# Patient Record
Sex: Male | Born: 1945
Health system: Southern US, Community
[De-identification: ages and names within clinical notes are randomized; demographics above are authoritative.]

## PROBLEM LIST (undated history)

## (undated) DIAGNOSIS — E785 Hyperlipidemia, unspecified: Secondary | ICD-10-CM

## (undated) DIAGNOSIS — S2239XA Fracture of one rib, unspecified side, initial encounter for closed fracture: Secondary | ICD-10-CM

## (undated) DIAGNOSIS — G4700 Insomnia, unspecified: Secondary | ICD-10-CM

## (undated) DIAGNOSIS — N200 Calculus of kidney: Secondary | ICD-10-CM

## (undated) DIAGNOSIS — K219 Gastro-esophageal reflux disease without esophagitis: Secondary | ICD-10-CM

## (undated) DIAGNOSIS — S329XXA Fracture of unspecified parts of lumbosacral spine and pelvis, initial encounter for closed fracture: Secondary | ICD-10-CM

## (undated) DIAGNOSIS — I2510 Atherosclerotic heart disease of native coronary artery without angina pectoris: Secondary | ICD-10-CM

## (undated) DIAGNOSIS — M1990 Unspecified osteoarthritis, unspecified site: Secondary | ICD-10-CM

## (undated) DIAGNOSIS — M069 Rheumatoid arthritis, unspecified: Secondary | ICD-10-CM

## (undated) DIAGNOSIS — Z8619 Personal history of other infectious and parasitic diseases: Secondary | ICD-10-CM

## (undated) DIAGNOSIS — I10 Essential (primary) hypertension: Secondary | ICD-10-CM

## (undated) DIAGNOSIS — Z87442 Personal history of urinary calculi: Secondary | ICD-10-CM

## (undated) DIAGNOSIS — Z9889 Other specified postprocedural states: Secondary | ICD-10-CM

## (undated) DIAGNOSIS — R7303 Prediabetes: Secondary | ICD-10-CM

## (undated) DIAGNOSIS — I2102 ST elevation (STEMI) myocardial infarction involving left anterior descending coronary artery: Secondary | ICD-10-CM

## (undated) DIAGNOSIS — I255 Ischemic cardiomyopathy: Secondary | ICD-10-CM

## (undated) DIAGNOSIS — R7302 Impaired glucose tolerance (oral): Secondary | ICD-10-CM

## (undated) HISTORY — DX: Personal history of other infectious and parasitic diseases: Z86.19

## (undated) HISTORY — DX: Atherosclerotic heart disease of native coronary artery without angina pectoris: I25.10

## (undated) HISTORY — DX: Hyperlipidemia, unspecified: E78.5

## (undated) HISTORY — DX: Rheumatoid arthritis, unspecified: M06.9

## (undated) HISTORY — PX: OTHER SURGICAL HISTORY: SHX169

## (undated) HISTORY — DX: Calculus of kidney: N20.0

## (undated) HISTORY — DX: Other specified postprocedural states: Z98.890

## (undated) HISTORY — DX: Essential (primary) hypertension: I10

## (undated) HISTORY — DX: Insomnia, unspecified: G47.00

## (undated) HISTORY — DX: Impaired glucose tolerance (oral): R73.02

## (undated) HISTORY — DX: Ischemic cardiomyopathy: I25.5

---

## 1966-12-13 DIAGNOSIS — N200 Calculus of kidney: Secondary | ICD-10-CM

## 1966-12-13 HISTORY — DX: Calculus of kidney: N20.0

## 1996-12-13 DIAGNOSIS — M069 Rheumatoid arthritis, unspecified: Secondary | ICD-10-CM

## 1996-12-13 HISTORY — DX: Rheumatoid arthritis, unspecified: M06.9

## 2005-01-18 ENCOUNTER — Ambulatory Visit: Payer: Self-pay | Admitting: Internal Medicine

## 2005-08-02 ENCOUNTER — Ambulatory Visit: Payer: Self-pay | Admitting: Internal Medicine

## 2005-08-23 ENCOUNTER — Ambulatory Visit: Payer: Self-pay | Admitting: Internal Medicine

## 2005-08-24 ENCOUNTER — Ambulatory Visit (HOSPITAL_BASED_OUTPATIENT_CLINIC_OR_DEPARTMENT_OTHER): Admission: RE | Admit: 2005-08-24 | Discharge: 2005-08-24 | Payer: Self-pay | Admitting: Urology

## 2005-08-24 ENCOUNTER — Ambulatory Visit (HOSPITAL_COMMUNITY): Admission: RE | Admit: 2005-08-24 | Discharge: 2005-08-24 | Payer: Self-pay | Admitting: Urology

## 2005-09-03 ENCOUNTER — Ambulatory Visit (HOSPITAL_BASED_OUTPATIENT_CLINIC_OR_DEPARTMENT_OTHER): Admission: RE | Admit: 2005-09-03 | Discharge: 2005-09-03 | Payer: Self-pay | Admitting: Urology

## 2005-09-03 ENCOUNTER — Ambulatory Visit (HOSPITAL_COMMUNITY): Admission: RE | Admit: 2005-09-03 | Discharge: 2005-09-03 | Payer: Self-pay | Admitting: Urology

## 2006-08-11 ENCOUNTER — Ambulatory Visit: Payer: Self-pay | Admitting: Internal Medicine

## 2006-08-17 ENCOUNTER — Ambulatory Visit: Payer: Self-pay | Admitting: Internal Medicine

## 2007-07-20 ENCOUNTER — Telehealth: Payer: Self-pay | Admitting: Internal Medicine

## 2007-08-22 ENCOUNTER — Telehealth: Payer: Self-pay | Admitting: Internal Medicine

## 2007-09-26 ENCOUNTER — Encounter: Payer: Self-pay | Admitting: Internal Medicine

## 2007-12-04 ENCOUNTER — Encounter: Payer: Self-pay | Admitting: Internal Medicine

## 2007-12-06 ENCOUNTER — Encounter: Payer: Self-pay | Admitting: Internal Medicine

## 2008-03-25 ENCOUNTER — Encounter: Payer: Self-pay | Admitting: Internal Medicine

## 2008-08-08 ENCOUNTER — Encounter: Payer: Self-pay | Admitting: Internal Medicine

## 2008-10-23 ENCOUNTER — Ambulatory Visit (HOSPITAL_COMMUNITY): Admission: RE | Admit: 2008-10-23 | Discharge: 2008-10-23 | Payer: Self-pay | Admitting: Urology

## 2008-12-09 ENCOUNTER — Encounter: Payer: Self-pay | Admitting: Internal Medicine

## 2008-12-11 ENCOUNTER — Encounter: Payer: Self-pay | Admitting: Internal Medicine

## 2008-12-11 ENCOUNTER — Telehealth: Payer: Self-pay | Admitting: Internal Medicine

## 2008-12-18 ENCOUNTER — Ambulatory Visit: Payer: Self-pay | Admitting: Internal Medicine

## 2008-12-18 DIAGNOSIS — N200 Calculus of kidney: Secondary | ICD-10-CM | POA: Insufficient documentation

## 2008-12-18 DIAGNOSIS — E739 Lactose intolerance, unspecified: Secondary | ICD-10-CM | POA: Insufficient documentation

## 2008-12-18 DIAGNOSIS — M069 Rheumatoid arthritis, unspecified: Secondary | ICD-10-CM | POA: Insufficient documentation

## 2008-12-18 DIAGNOSIS — Z87442 Personal history of urinary calculi: Secondary | ICD-10-CM | POA: Insufficient documentation

## 2008-12-18 DIAGNOSIS — J309 Allergic rhinitis, unspecified: Secondary | ICD-10-CM | POA: Insufficient documentation

## 2008-12-18 DIAGNOSIS — G4700 Insomnia, unspecified: Secondary | ICD-10-CM | POA: Insufficient documentation

## 2008-12-18 LAB — CONVERTED CEMR LAB: Blood Glucose, Fingerstick: 163

## 2008-12-26 ENCOUNTER — Telehealth (INDEPENDENT_AMBULATORY_CARE_PROVIDER_SITE_OTHER): Payer: Self-pay

## 2009-03-25 ENCOUNTER — Ambulatory Visit: Payer: Self-pay | Admitting: Internal Medicine

## 2009-03-25 LAB — CONVERTED CEMR LAB: Blood Glucose, Fingerstick: 103

## 2009-04-10 ENCOUNTER — Encounter: Payer: Self-pay | Admitting: Internal Medicine

## 2009-04-17 ENCOUNTER — Encounter: Payer: Self-pay | Admitting: Internal Medicine

## 2009-08-07 ENCOUNTER — Encounter: Payer: Self-pay | Admitting: Internal Medicine

## 2009-08-29 ENCOUNTER — Encounter: Payer: Self-pay | Admitting: Internal Medicine

## 2009-09-23 ENCOUNTER — Ambulatory Visit: Payer: Self-pay | Admitting: Internal Medicine

## 2009-09-23 DIAGNOSIS — B351 Tinea unguium: Secondary | ICD-10-CM | POA: Insufficient documentation

## 2009-09-23 DIAGNOSIS — Z87891 Personal history of nicotine dependence: Secondary | ICD-10-CM | POA: Insufficient documentation

## 2009-09-23 LAB — CONVERTED CEMR LAB: Hgb A1c MFr Bld: 5.9 % (ref 4.6–6.5)

## 2009-10-20 ENCOUNTER — Encounter (INDEPENDENT_AMBULATORY_CARE_PROVIDER_SITE_OTHER): Payer: Self-pay | Admitting: *Deleted

## 2009-12-11 ENCOUNTER — Encounter: Payer: Self-pay | Admitting: Internal Medicine

## 2010-03-24 ENCOUNTER — Ambulatory Visit: Payer: Self-pay | Admitting: Internal Medicine

## 2010-03-24 DIAGNOSIS — E1159 Type 2 diabetes mellitus with other circulatory complications: Secondary | ICD-10-CM | POA: Insufficient documentation

## 2010-03-24 DIAGNOSIS — I152 Hypertension secondary to endocrine disorders: Secondary | ICD-10-CM | POA: Insufficient documentation

## 2010-03-24 DIAGNOSIS — I10 Essential (primary) hypertension: Secondary | ICD-10-CM

## 2010-03-24 LAB — CONVERTED CEMR LAB: Blood Glucose, Fingerstick: 103

## 2010-04-09 ENCOUNTER — Encounter: Payer: Self-pay | Admitting: Internal Medicine

## 2010-04-13 ENCOUNTER — Telehealth: Payer: Self-pay

## 2010-04-20 ENCOUNTER — Encounter: Payer: Self-pay | Admitting: Internal Medicine

## 2010-08-07 ENCOUNTER — Encounter: Payer: Self-pay | Admitting: Internal Medicine

## 2010-09-01 ENCOUNTER — Telehealth: Payer: Self-pay | Admitting: Internal Medicine

## 2010-09-02 ENCOUNTER — Ambulatory Visit: Payer: Self-pay | Admitting: Internal Medicine

## 2010-10-26 ENCOUNTER — Encounter: Payer: Self-pay | Admitting: Internal Medicine

## 2010-12-16 ENCOUNTER — Encounter: Payer: Self-pay | Admitting: Internal Medicine

## 2011-01-12 NOTE — Letter (Signed)
Summary: [facility]  [facility]   Imported By: Maryln Gottron 04/28/2010 09:00:10  _____________________________________________________________________  External Attachment:    Type:   Image     Comment:   External Document

## 2011-01-12 NOTE — Letter (Signed)
Summary: [facility]   Imported By: Maryln Gottron 04/21/2010 11:05:29  _____________________________________________________________________  External Attachment:    Type:   Image     Comment:   External Document

## 2011-01-12 NOTE — Progress Notes (Signed)
Summary: Pt req script for Tamsulosin HCL 0.4mg   Phone Note Call from Patient Call back at Work Phone [phone]   Caller: Patient Summary of Call: Pt is req a written script for Tamsulosin HLC 0.4mg .       Pt has to pick up script for mail order. pt is coming in for flu shot on 9/21 and would like to pick up script then.  Initial call taken by: Lucy Antigua,  September 01, 2010 12:26 PM    Prescriptions: FLOMAX 0.4 MG CP24 (TAMSULOSIN HCL) 1 once daily  #90 x 3   Entered by:   Duard Brady LPN   Authorized by:   Gordy Savers  MD   Signed by:   Duard Brady LPN on 09/81/1914   Method used:   Print then Give to Patient   RxID:   [id number]  printed rx for pick up tomorrow at flu clinic   KIK

## 2011-01-12 NOTE — Assessment & Plan Note (Signed)
Summary: flu shot/cjr  Nurse Visit   Vitals Entered By: Duard Brady LPN (September 02, 2010 3:17 PM)  Allergies: 1)  Ibuprofen (Ibuprofen) 2)  Penicillin G Potassium (Penicillin G Potassium) 3)  Sulfamethoxazole (Sulfamethoxazole)  Orders Added: 1)  Admin 1st Vaccine [90471] 2)  Flu Vaccine 31yrs + [39767] Flu Vaccine Consent Questions     Do you have a history of severe allergic reactions to this vaccine? no    Any prior history of allergic reactions to egg and/or gelatin? no    Do you have a sensitivity to the preservative Thimersol? no    Do you have a past history of Guillan-Barre Syndrome? no    Do you currently have an acute febrile illness? no    Have you ever had a severe reaction to latex? no    Vaccine information given and explained to patient? yes    Are you currently pregnant? no    Lot Number:[id number]   Exp Date:06/12/2011   Site Given  Left Deltoid IM .lbflu

## 2011-01-12 NOTE — Letter (Signed)
Summary: [facility]   Imported By: Maryln Gottron 12/26/2009 14:21:13  _____________________________________________________________________  External Attachment:    Type:   Image     Comment:   External Document

## 2011-01-12 NOTE — Letter (Signed)
Summary: [facility]  [facility]   Imported By: Maryln Gottron 10/30/2010 09:53:16  _____________________________________________________________________  External Attachment:    Type:   Image     Comment:   External Document

## 2011-01-12 NOTE — Assessment & Plan Note (Signed)
Summary: 6 month rov/njr  and aAnd a, and and a the and a third in the and a nerve chief and with a with a phone in a for for a this is a for a visit for on his nose and a  Vital Signs:  Patient profile:   65 year old male Weight:      182 pounds Temp:     98.2 degrees F oral BP sitting:   120 / 72  (right arm) Cuff size:   regular  Vitals Entered By: Duard Brady LPN (March 24, 2010 1:57 PM)  halfCC: 6 mos rov  - doing well -  Is Patient Diabetic? No CBG Result 103   CC:  6 mos rov  - doing well - .  History of Present Illness: 65 year old patient who is followed by rheumatology for RA.  He has a history of mild hypertension, allergic rhinitis, and episodic insomnia.  Also, has a history of mild impaired glucose tolerance.  His last hemoglobin A1c was  5.9. A  random blood sugar today 103.  Preventive Screening-Counseling & Management  Alcohol-Tobacco     Smoking Status: never  Allergies: 1)  Ibuprofen (Ibuprofen) 2)  Penicillin G Potassium (Penicillin G Potassium) 3)  Sulfamethoxazole (Sulfamethoxazole)  Past History:  Past Medical History: rheumatoid arthritis 1998 Nephrolithiasis, hx of 1968 insomnia impaired glucose tolerance Allergic rhinitis Hypertension  Family History: Reviewed history from 12/18/2008 and no changes required. father with a history of adult onset diabetes;died age 50, sudden cardiac death mother died of pancreatic cancer; history of pulmonary embolism  Social History: Reviewed history from 12/18/2008 and no changes required. Married remote tobacco use Retired  Review of Systems  The patient denies anorexia, fever, weight loss, weight gain, vision loss, decreased hearing, hoarseness, chest pain, syncope, dyspnea on exertion, peripheral edema, prolonged cough, headaches, hemoptysis, abdominal pain, melena, hematochezia, severe indigestion/heartburn, hematuria, incontinence, genital sores, muscle weakness, suspicious skin lesions,  transient blindness, difficulty walking, depression, unusual weight change, abnormal bleeding, enlarged lymph nodes, angioedema, breast masses, and testicular masses.    Physical Exam  General:  Well-developed,well-nourished,in no acute distress; alert,appropriate and cooperative throughout examination Head:  Normocephalic and atraumatic without obvious abnormalities. No apparent alopecia or balding. Eyes:  No corneal or conjunctival inflammation noted. EOMI. Perrla. Funduscopic exam benign, without hemorrhages, exudates or papilledema. Vision grossly normal. Ears:  External ear exam shows no significant lesions or deformities.  Otoscopic examination reveals clear canals, tympanic membranes are intact bilaterally without bulging, retraction, inflammation or discharge. Hearing is grossly normal bilaterally. Mouth:  Oral mucosa and oropharynx without lesions or exudates.  Teeth in good repair. Neck:  No deformities, masses, or tenderness noted. Lungs:  Normal respiratory effort, chest expands symmetrically. Lungs are clear to auscultation, no crackles or wheezes. Heart:  Normal rate and regular rhythm. S1 and S2 normal without gallop, murmur, click, rub or other extra sounds. Abdomen:  Bowel sounds positive,abdomen soft and non-tender without masses, organomegaly or hernias noted. Msk:  No deformity or scoliosis noted of thoracic or lumbar spine.   Pulses:  R and L carotid,radial,femoral,dorsalis pedis and posterior tibial pulses are full and equal bilaterally Extremities:  No clubbing, cyanosis, edema, or deformity noted with normal full range of motion of all joints.     Impression & Recommendations:  Problem # 1:  ALLERGIC RHINITIS (ICD-477.9)  His updated medication list for this problem includes:    Fluticasone Propionate 50 Mcg/act Susp (Fluticasone propionate) .Marland Kitchen... 1 puff ea nare once daily  His  updated medication list for this problem includes:    Fluticasone Propionate 50 Mcg/act  Susp (Fluticasone propionate) .Marland Kitchen... 1 puff ea nare once daily  Problem # 2:  IMPAIRED GLUCOSE TOLERANCE (ICD-271.3)  Complete Medication List: 1)  Flomax 0.4 Mg Cp24 (Tamsulosin hcl) .Marland Kitchen.. 1 once daily 2)  Fluticasone Propionate 50 Mcg/act Susp (Fluticasone propionate) .Marland Kitchen.. 1 puff ea nare once daily 3)  Meloxicam 7.5 Mg Tabs (Meloxicam) .Marland Kitchen.. 1 two times a day as needed 4)  Methotrexate 2.5 Mg Tabs (Methotrexate sodium) .... 6 tabs q week 5)  Folic Acid 1 Mg Tabs (Folic acid) .Marland Kitchen.. 1 once daily 6)  Hydrochlorothiazide 25 Mg Tabs (Hydrochlorothiazide) .Marland Kitchen.. 1 once daily 7)  Trazodone Hcl 100 Mg Tabs (Trazodone hcl) .... One at bedtime 8)  Zolpidem Tartrate 10 Mg Tabs (Zolpidem tartrate) .... One at bedtime as needed for sleep  Other Orders: Capillary Blood Glucose/CBG (16109)  Patient Instructions: 1)  Please schedule a follow-up appointment in 1 year. 2)  Limit your Sodium (Salt). 3)  It is important that you exercise regularly at least 20 minutes 5 times a week. If you develop chest pain, have severe difficulty breathing, or feel very tired , stop exercising immediately and seek medical attention. Prescriptions: ZOLPIDEM TARTRATE 10 MG TABS (ZOLPIDEM TARTRATE) one at bedtime as needed for sleep  #60 x 1   Entered and Authorized by:   Gordy Savers  MD   Signed by:   Gordy Savers  MD on 03/24/2010   Method used:   Print then Give to Patient   RxID:   [id number] TRAZODONE HCL 100 MG TABS (TRAZODONE HCL) one at bedtime  #90 x 6   Entered and Authorized by:   Gordy Savers  MD   Signed by:   Gordy Savers  MD on 03/24/2010   Method used:   Print then Give to Patient   RxID:   [id number] HYDROCHLOROTHIAZIDE 25 MG TABS (HYDROCHLOROTHIAZIDE) 1 once daily  #90 x 6   Entered and Authorized by:   Gordy Savers  MD   Signed by:   Gordy Savers  MD on 03/24/2010   Method used:   Print then Give to Patient   RxID:   [id number] MELOXICAM 7.5  MG TABS (MELOXICAM) 1 two times a day as needed  #180 x 6   Entered and Authorized by:   Gordy Savers  MD   Signed by:   Gordy Savers  MD on 03/24/2010   Method used:   Print then Give to Patient   RxID:   [id number] FLUTICASONE PROPIONATE 50 MCG/ACT SUSP (FLUTICASONE PROPIONATE) 1 puff ea nare once daily  #3 x 6   Entered and Authorized by:   Gordy Savers  MD   Signed by:   Gordy Savers  MD on 03/24/2010   Method used:   Print then Give to Patient   RxID:   [id number] FLOMAX 0.4 MG CP24 (TAMSULOSIN HCL) 1 once daily  #90 x 6   Entered and Authorized by:   Gordy Savers  MD   Signed by:   Gordy Savers  MD on 03/24/2010   Method used:   Print then Give to Patient   RxID:   [id number]

## 2011-01-12 NOTE — Progress Notes (Signed)
Summary: elevated Ca - false reading  Phone Note Call from Patient   Caller: Patient Call For: Gordy Savers  MD Reason for Call: Talk to Doctor Summary of Call: calling to let us know that he got a call from Dr. Linton Flemings last week r/t elevated Ca - pt belives it if from taking Mylanta at the time of the labwk. feels this is a false reading and wanted to let us know incase we rec'd report. Initial call taken by: Duard Brady LPN,  Apr 13, 1609 2:11 PM

## 2011-01-12 NOTE — Letter (Signed)
Summary: [facility]   Imported By: Sherian Rein 08/19/2010 11:19:19  _____________________________________________________________________  External Attachment:    Type:   Image     Comment:   External Document

## 2011-01-14 NOTE — Letter (Signed)
Summary: [facility]   Imported By: Maryln Gottron 12/29/2010 10:58:33  _____________________________________________________________________  External Attachment:    Type:   Image     Comment:   External Document

## 2011-04-27 NOTE — Op Note (Signed)
NAMEPARKE, JANDREAU           ACCOUNT NO.:  [id number]   MEDICAL RECORD NO.:  [id number]          PATIENT TYPE:  AMB   LOCATION:  DAY                          FACILITY:  [facility]   PHYSICIAN:  Lindaann Slough, M.D.  DATE OF BIRTH:  1946/01/19   DATE OF PROCEDURE:  10/23/2008  DATE OF DISCHARGE:                               OPERATIVE REPORT   PREOPERATIVE DIAGNOSIS:  Right ureteral stone, right hydronephrosis.   POSTOPERATIVE DIAGNOSIS:  Right ureteral stone, right hydronephrosis.   PROCEDURE:  Cystoscopy, right retrograde pyelogram, ureteroscopy,  Holmium laser right ureteral stone, stone extraction, insertion of  double-J catheter.   SURGEON:  Danae Chen, M.D.   ANESTHESIA:  General.   INDICATIONS:  The patient is a 65 year old male who was seen in the  office yesterday with a history of sudden onset of right flank pain  associated with nausea 3 days ago.  The pain went away, but returned  yesterday morning and was severe.  He has a past history of kidney  stones.  CT scan showed severe right hydronephrosis with a 4 mm stone in  the right distal ureter.  The patient is scheduled today for cystoscopy,  retrograde pyelogram, ureteroscopy, Holmium laser of the ureteral stone  and insertion of double-J catheter   PROCEDURE IN DETAIL:  The patient was identified by his wristband and a  proper time-out was taken.  He was then placed in the dorsal lithotomy  position.  A panendoscope was inserted in the bladder.  The anterior  urethra is normal.  He has moderate prostatic hypertrophy.  The bladder  is moderately trabeculated.  There is no stone or tumor in the bladder.  The ureteral orifices are in normal position and shape with clear  efflux.   Retrograde pyelogram.   A sensor tip guidewire was passed through an open-ended catheter and a  ureteral catheter and open-ended guidewire were passed through the  cystoscope into the right ureteral orifice.  The ureteral catheter  was  advanced over the guidewire into the distal ureter.  The guidewire was  then removed.  Contrast was then injected through the open-ended  catheter.  The distal ureter appears normal.  There is a filling defect  in the distal ureter with severe proximal hydroureter and  hydronephrosis.  The sensor tip guidewire was then passed through the  open-ended catheter and advanced into the renal pelvis.  The open-ended  catheter was then removed.   A semi rigid ureteroscope was then passed in the bladder and passed  through the ureteral orifice and into the distal ureter.  There is some  edema in the distal ureter and the stone was up the distal ureter.  Using the 365 micro fiber laser the stone was fragmented into smaller  stone fragments.  The stone fragments were then removed with the Nitinol  basket and retrieved.   Second retrograde pyelogram: Contrast was then injected through the  ureteroscope and there was no evidence of extravasation of contrast.  The ureteroscope was then removed.   The guidewire was then back loaded into the cystoscope and a #6 [place]  double-J catheter was passed over the guidewire into the right renal  unit.  The proximal curl of the double-J catheter is in the renal  pelvis.  The distal curl is in the bladder.  The bladder was then  emptied and then the cystoscope and guidewire were removed.   The patient tolerated the procedure well and left the OR in satisfactory  condition to post anesthesia care unit.      Lindaann Slough, M.D.  Electronically Signed     MN/MEDQ  D:  10/23/2008  T:  10/24/2008  Job:  [id number]

## 2011-04-30 NOTE — Op Note (Signed)
[name], [name]           ACCOUNT NO.:  [id number]   MEDICAL RECORD NO.:  [id number]          PATIENT TYPE:  AMB   LOCATION:  [facility]                         FACILITY:  [facility]   PHYSICIAN:  Lindaann Slough, M.D.  DATE OF BIRTH:  1946-05-27   DATE OF PROCEDURE:  08/24/2005  DATE OF DISCHARGE:                                 OPERATIVE REPORT   PREOPERATIVE DIAGNOSES:  Right ureteral calculus and right renal calculi.   POSTOPERATIVE DIAGNOSES:  Right ureteral calculus and right renal calculi.   PROCEDURE:  Cystoscopy, right retrograde pyelogram, ureteroscopy, holmium  laser of right ureteral calculus and insertion of double-J catheter.   SURGEON:  Danae Chen, M.D.   ANESTHESIA:  General.   INDICATIONS FOR PROCEDURE:  The patient is a 65 year old male who has had a  long history of kidney stones. He has had multiple lithotripsies and  cystoscopies and stone extraction in the past. He has not had any stones  during the past 4 years. Yesterday, he had severe right flank pain  associated with nausea and vomiting. A CT scan of the abdomen and pelvis  showed multiple right renal calculi and a 7 mm stone in the right distal  ureter with hydronephrosis. KUB today shows the stone in the same location.  The patient is scheduled for stone manipulation.   Under general anesthesia, the patient was prepped and draped and placed in  the dorsal lithotomy position. A #22 Wappler cystoscope was inserted in the  bladder. The urethra is normal. He has a moderate prostatic hypertrophy. The  bladder mucosa is normal. There is no stone or tumor in the bladder. The  ureteral orifices are in normal position and shape.   A cone tip catheter was then passed through the cystoscope and into the  right ureteral orifice. Contrast was then injected through the cone tip  catheter. There is a filling defect in the distal ureter with proximal  dilation of the ureter. The cone tip catheter was then  removed.   A guidewire was then passed through the cystoscope into the right ureter and  the cystoscope was removed. A 6.5 French rigid ureteroscope was then passed  in the bladder into the ureter. There is a stone in the distal ureter. The  stone was then fragmented with the holmium laser in small fragments. The  holmium laser  fiber was then removed. A nitinol stone basked was then  passed through the ureteroscope and the stone fragments were removed out of  the ureter and dropped in the bladder.   Contrast was then injected through the ureteroscope and there was no  evidence of extravasation of the contrast.  Then the ureteroscope was removed. The guidewire was backloaded into the  cystoscope and a #6 [place] - 26 double-J catheter was passed over the  guidewire. The proximal curl of the double-J catheter is in the renal  pelvis. The distal curl is in the bladder. Then the guidewire was removed.  The stone fragments were then irrigated out of the bladder.   The bladder was then emptied and the cystoscope was removed.   The patient  tolerated the procedure well and left the OR in satisfactory  condition to post anesthesia care unit.      Lindaann Slough, M.D.  Electronically Signed     MN/MEDQ  D:  08/24/2005  T:  08/24/2005  Job:  [id number]   cc:   Gordy Savers, M.D. [facility]  [address] [place]  [place] [address]

## 2011-04-30 NOTE — Op Note (Signed)
[name], [name]           ACCOUNT NO.:  [id number]   MEDICAL RECORD NO.:  [id number]          PATIENT TYPE:  AMB   LOCATION:  [facility]                         FACILITY:  [facility]   PHYSICIAN:  Mark C. Vernie Ammons, M.D.  DATE OF BIRTH:  October 07, 1946   DATE OF PROCEDURE:  09/03/2005  DATE OF DISCHARGE:                                 OPERATIVE REPORT   PREOPERATIVE DIAGNOSIS:  Right ureteral stone.   POSTOPERATIVE DIAGNOSIS:  Right ureteral stone.   PROCEDURE:  1.  Cystourethroscopy.  2.  Right retrograde pyelography with interpretation.  3.  Right ureteroscopy.  4.  Right ureteral stone manipulation.   SURGEON:  Dr. Ihor Gully   ASSISTANT:  Dr. Glade Nurse   ANESTHESIA:  General endotracheal.   PROCEDURE:  The patient was brought to room 2 where he received preoperative  antibiotics and was prepped and draped in the usual sterile fashion, taking  care to avoid compartment syndrome and peripheral neuropathy.  Next, we  placed a 22-French rigid cystoscope into his anterior urethra.  He underwent  cystourethroscopy which demonstrated normal anterior-posterior urethra.  His  prostatic urethra was without median or lateral lobe hypertrophy.  Upon  entering his bladder, his bilateral ureteral orifices were noted in their  normal anatomic position, effluxing clear urine bilaterally.  There were no  foreign bodies or other mucosal abnormalities.  We turned our attention to  the right ureteral orifice.  It was gently intubated with a 0.038 guidewire  which was maneuvered to the level of the right renal pelvis under direct  fluoroscopic guidance.  Next we backloaded the scope off the wire and  reentered the bladder where a second 0.038 guidewire was introduced into the  right ureteral orifice and placed at the level of the right renal pelvis  under direct fluoroscopic guidance.  The scope again was backloaded off the  wire.  Next, a flexible ureteroscope was placed over the working  wire to the  level of the right renal pelvis under direct fluoroscopic guidance.  The  wire was then removed.   Next using 10 mL of Conray, we obtained a right retrograde pyelogram which  showed somewhat dilated renal pelvis.  The calyces were also mildly dilated  throughout.  There were no filling defects demonstrated.  Next, we proceeded  with examination of each calix, starting at the upper pole and working our  way through the mid pole and lower pole calices as well as the renal pelvis.  No stones, foreign bodies, or mucosal abnormalities were seen.  We then  proceeded to inspect the ureter.  The UPJ was normal caliber.  The proximal  ureter was without any abnormalities or filling defects.  At the level of  pelvic brim, there was an area of inflammation.  A small 1-2 mm stone was  seen, which was not impacted but adherent to the posterior wall of the  ureter.  Next, using a nitinol stone basket, we easily grasped it and  continued to inspect the remainder of the ureter distally, which after the  area of the stone, was without mucosal abnormality.  We removed  the  ureteroscope to the level of the external meatus.  Following this, the patient's bladder was emptied by reinserting the  cystoscope.  Because of the atraumatic nature of this, decision was made not  to leave a ureteral stent.  The safety wire was removed, and the case was  terminated.  Please note Dr. Ihor Gully was present and participated in  all aspects of this case.     ______________________________  Glade Nurse, MD      [name], M.D.  Electronically Signed    MT/MEDQ  D:  09/03/2005  T:  09/03/2005  Job:  [id number]

## 2011-05-05 ENCOUNTER — Other Ambulatory Visit: Payer: Self-pay | Admitting: Internal Medicine

## 2011-05-05 NOTE — Telephone Encounter (Signed)
Refill HCTZ 25mg  1qd to [facility] mail order.  Please fax to [phone].

## 2011-05-06 MED ORDER — HYDROCHLOROTHIAZIDE 25 MG PO TABS: 25.0000 mg | ORAL_TABLET | Freq: Every day | ORAL | Status: DC

## 2011-05-06 NOTE — Telephone Encounter (Signed)
efilled to mail order - needs to be seen - last seen 03/2010

## 2011-05-18 ENCOUNTER — Other Ambulatory Visit: Payer: Self-pay | Admitting: Internal Medicine

## 2011-05-18 MED ORDER — HYDROCHLOROTHIAZIDE 25 MG PO TABS: 25.0000 mg | ORAL_TABLET | Freq: Every day | ORAL | Status: DC

## 2011-05-18 NOTE — Telephone Encounter (Signed)
Spoke with pt - informed that he needs to be seen - last seen 03/2010 - was given #60 with same instructions last RF - will give #30 ,he will pick up and make appt rov . KIK

## 2011-05-18 NOTE — Telephone Encounter (Signed)
Pt needs new rx hctz 25mg  #90 with 3 refills. Pt would like to pick up rx to mail.

## 2011-05-21 ENCOUNTER — Ambulatory Visit (INDEPENDENT_AMBULATORY_CARE_PROVIDER_SITE_OTHER): Payer: BC Managed Care – PPO | Admitting: Internal Medicine

## 2011-05-21 ENCOUNTER — Encounter: Payer: Self-pay | Admitting: Internal Medicine

## 2011-05-21 VITALS — BP 104/70 | Temp 98.3°F | Wt 173.0 lb

## 2011-05-21 DIAGNOSIS — L919 Hypertrophic disorder of the skin, unspecified: Secondary | ICD-10-CM

## 2011-05-21 DIAGNOSIS — I10 Essential (primary) hypertension: Secondary | ICD-10-CM

## 2011-05-21 DIAGNOSIS — L918 Other hypertrophic disorders of the skin: Secondary | ICD-10-CM

## 2011-05-21 DIAGNOSIS — M069 Rheumatoid arthritis, unspecified: Secondary | ICD-10-CM

## 2011-05-21 DIAGNOSIS — J309 Allergic rhinitis, unspecified: Secondary | ICD-10-CM

## 2011-05-21 DIAGNOSIS — E739 Lactose intolerance, unspecified: Secondary | ICD-10-CM

## 2011-05-21 DIAGNOSIS — R7302 Impaired glucose tolerance (oral): Secondary | ICD-10-CM

## 2011-05-21 DIAGNOSIS — R7309 Other abnormal glucose: Secondary | ICD-10-CM

## 2011-05-21 DIAGNOSIS — L909 Atrophic disorder of skin, unspecified: Secondary | ICD-10-CM

## 2011-05-21 LAB — POCT CBG (FASTING - GLUCOSE)-MANUAL ENTRY: Glucose Fasting, POC: 114 mg/dL

## 2011-05-21 MED ORDER — ZOLPIDEM TARTRATE 10 MG PO TABS: 10.0000 mg | ORAL_TABLET | Freq: Every evening | ORAL | Status: DC | PRN

## 2011-05-21 MED ORDER — TRAZODONE HCL 100 MG PO TABS: 100.0000 mg | ORAL_TABLET | Freq: Every day | ORAL | Status: DC

## 2011-05-21 MED ORDER — TAMSULOSIN HCL 0.4 MG PO CAPS: 0.4000 mg | ORAL_CAPSULE | Freq: Every day | ORAL | Status: DC

## 2011-05-21 MED ORDER — HYDROCHLOROTHIAZIDE 25 MG PO TABS: 25.0000 mg | ORAL_TABLET | Freq: Every day | ORAL | Status: DC

## 2011-05-21 MED ORDER — MELOXICAM 7.5 MG PO TABS: 7.5000 mg | ORAL_TABLET | Freq: Two times a day (BID) | ORAL | Status: DC

## 2011-05-21 MED ORDER — FLUTICASONE PROPIONATE 50 MCG/ACT NA SUSP: 2.0000 | Freq: Every day | NASAL | Status: DC

## 2011-05-21 NOTE — Progress Notes (Signed)
  Subjective:    Patient ID: [name], male    DOB: July 29, 1946, 65 y.o.   MRN: [id number]  HPI  65 year old patient who is seen today for followup. He has a history of hypertension treated with diuretic therapy. He is followed by rheumatology for rheumatoid arthritis which has been stable. He is allergic rhinitis intermittent insomnia and a history of impaired glucose tolerance. Hemoglobin A1c's have been normal. He has a complaint of a skin tag involving his right anterolateral neck that is bothersome with shaving and irritation from his collar.   Review of Systems  Constitutional: Negative for fever, chills, appetite change and fatigue.  HENT: Negative for hearing loss, ear pain, congestion, sore throat, trouble swallowing, neck stiffness, dental problem, voice change and tinnitus.   Eyes: Negative for pain, discharge and visual disturbance.  Respiratory: Negative for cough, chest tightness, wheezing and stridor.   Cardiovascular: Negative for chest pain, palpitations and leg swelling.  Gastrointestinal: Negative for nausea, vomiting, abdominal pain, diarrhea, constipation, blood in stool and abdominal distention.  Genitourinary: Negative for urgency, hematuria, flank pain, discharge, difficulty urinating and genital sores.  Musculoskeletal: Negative for myalgias, back pain, joint swelling, arthralgias and gait problem.  Skin: Negative for rash.       [Skin tag right anterolateral neck Neurological: Negative for dizziness, syncope, speech difficulty, weakness, numbness and headaches.  Hematological: Negative for adenopathy. Does not bruise/bleed easily.  Psychiatric/Behavioral: Negative for behavioral problems and dysphoric mood. The patient is not nervous/anxious.        Objective:   Physical Exam  Constitutional: He is oriented to person, place, and time. He appears well-developed.  HENT:  Head: Normocephalic.  Right Ear: External ear normal.  Left Ear: External ear normal.   Eyes: Conjunctivae and EOM are normal.  Neck: Normal range of motion.  Cardiovascular: Normal rate and normal heart sounds.   Pulmonary/Chest: Breath sounds normal.  Abdominal: Bowel sounds are normal.  Musculoskeletal: Normal range of motion. He exhibits no edema and no tenderness.  Neurological: He is alert and oriented to person, place, and time.  Skin:       Skin tag right anterolateral neck  Psychiatric: He has a normal mood and affect. His behavior is normal.          Assessment & Plan:   Rheumatoid arthritis stable Hypertension well controlled Impaired glucose tolerance stable  Skin tag right neck  Procedure note.  After cleansing with Betadine and alcohol the skin tag was locally anesthetized with 1% Xylocaine and removed with scissors.  A bandage applied

## 2011-05-21 NOTE — Patient Instructions (Signed)
Limit your sodium (Salt) intake  Please check your blood pressure on a regular basis.  If it is consistently greater than 150/90, please make an office appointment.    It is important that you exercise regularly, at least 20 minutes 3 to 4 times per week.  If you develop chest pain or shortness of breath seek  medical attention.  Take a calcium supplement, plus 800-1200 units of vitamin D 

## 2011-09-14 LAB — HEMOGLOBIN AND HEMATOCRIT, BLOOD
HCT: 45.8
Hemoglobin: 15.7

## 2011-09-15 ENCOUNTER — Other Ambulatory Visit: Payer: Self-pay | Admitting: Internal Medicine

## 2011-12-20 ENCOUNTER — Ambulatory Visit (INDEPENDENT_AMBULATORY_CARE_PROVIDER_SITE_OTHER): Payer: BC Managed Care – PPO | Admitting: Internal Medicine

## 2011-12-20 ENCOUNTER — Encounter: Payer: Self-pay | Admitting: Internal Medicine

## 2011-12-20 DIAGNOSIS — R7309 Other abnormal glucose: Secondary | ICD-10-CM

## 2011-12-20 DIAGNOSIS — I10 Essential (primary) hypertension: Secondary | ICD-10-CM

## 2011-12-20 DIAGNOSIS — J069 Acute upper respiratory infection, unspecified: Secondary | ICD-10-CM

## 2011-12-20 DIAGNOSIS — E739 Lactose intolerance, unspecified: Secondary | ICD-10-CM

## 2011-12-20 LAB — GLUCOSE, POCT (MANUAL RESULT ENTRY): POC Glucose: 135

## 2011-12-20 NOTE — Progress Notes (Signed)
  Subjective:    Patient ID: [name], male    DOB: 1945/12/18, 66 y.o.   MRN: [id number]  HPI  66 year old patient who is followed closely by rheumatology due to her rheumatoid arthritis. He had laboratory studies done by urology recently and a random blood sugar was 170. He does have a history of impaired glucose tolerance. A random blood sugar this afternoon 135. For the past few days he has had some low-grade fever weakness malaise and generalized myalgias he has felt unwell and has had mild nonproductive cough    Review of Systems  Constitutional: Positive for fever, fatigue and unexpected weight change. Negative for chills and appetite change.  HENT: Negative for hearing loss, ear pain, congestion, sore throat, trouble swallowing, neck stiffness, dental problem, voice change and tinnitus.   Eyes: Negative for pain, discharge and visual disturbance.  Respiratory: Negative for cough, chest tightness, wheezing and stridor.   Cardiovascular: Negative for chest pain, palpitations and leg swelling.  Gastrointestinal: Negative for nausea, vomiting, abdominal pain, diarrhea, constipation, blood in stool and abdominal distention.  Genitourinary: Negative for urgency, hematuria, flank pain, discharge, difficulty urinating and genital sores.  Musculoskeletal: Positive for myalgias. Negative for back pain, joint swelling, arthralgias and gait problem.  Skin: Negative for rash.  Neurological: Negative for dizziness, syncope, speech difficulty, weakness, numbness and headaches.  Hematological: Negative for adenopathy. Does not bruise/bleed easily.  Psychiatric/Behavioral: Negative for behavioral problems and dysphoric mood. The patient is not nervous/anxious.        Objective:   Physical Exam  Constitutional: He is oriented to person, place, and time. He appears well-developed.  HENT:  Head: Normocephalic.  Right Ear: External ear normal.  Left Ear: External ear normal.  Eyes:  Conjunctivae and EOM are normal.  Neck: Normal range of motion.  Cardiovascular: Normal rate and normal heart sounds.   Pulmonary/Chest: Breath sounds normal.  Abdominal: Bowel sounds are normal.  Musculoskeletal: Normal range of motion. He exhibits no edema and no tenderness.  Neurological: He is alert and oriented to person, place, and time.  Psychiatric: He has a normal mood and affect. His behavior is normal.          Assessment & Plan:   Viral URI Impaired glucose tolerance Hypertension  We'll treat symptomatically. We'll see in 6 months for an annual physical.

## 2011-12-20 NOTE — Patient Instructions (Signed)
Limit your sodium (Salt) intake    It is important that you exercise regularly, at least 20 minutes 3 to 4 times per week.  If you develop chest pain or shortness of breath seek  medical attention.  Return in 6 months for follow-up  

## 2012-02-10 DIAGNOSIS — Z79899 Other long term (current) drug therapy: Secondary | ICD-10-CM | POA: Diagnosis not present

## 2012-02-10 DIAGNOSIS — M069 Rheumatoid arthritis, unspecified: Secondary | ICD-10-CM | POA: Diagnosis not present

## 2012-04-10 DIAGNOSIS — M069 Rheumatoid arthritis, unspecified: Secondary | ICD-10-CM | POA: Diagnosis not present

## 2012-04-10 DIAGNOSIS — M6530 Trigger finger, unspecified finger: Secondary | ICD-10-CM | POA: Diagnosis not present

## 2012-05-01 ENCOUNTER — Other Ambulatory Visit: Payer: Self-pay | Admitting: Internal Medicine

## 2012-06-09 DIAGNOSIS — M069 Rheumatoid arthritis, unspecified: Secondary | ICD-10-CM | POA: Diagnosis not present

## 2012-06-15 ENCOUNTER — Other Ambulatory Visit: Payer: Self-pay | Admitting: Internal Medicine

## 2012-06-29 ENCOUNTER — Other Ambulatory Visit: Payer: Self-pay | Admitting: Internal Medicine

## 2012-07-27 DIAGNOSIS — M069 Rheumatoid arthritis, unspecified: Secondary | ICD-10-CM | POA: Diagnosis not present

## 2012-07-30 ENCOUNTER — Other Ambulatory Visit: Payer: Self-pay | Admitting: Internal Medicine

## 2012-08-02 ENCOUNTER — Other Ambulatory Visit: Payer: Self-pay | Admitting: Internal Medicine

## 2012-09-13 ENCOUNTER — Encounter: Payer: Self-pay | Admitting: Cardiology

## 2012-09-26 DIAGNOSIS — M069 Rheumatoid arthritis, unspecified: Secondary | ICD-10-CM | POA: Diagnosis not present

## 2012-10-28 ENCOUNTER — Other Ambulatory Visit: Payer: Self-pay | Admitting: Internal Medicine

## 2012-10-30 NOTE — Telephone Encounter (Signed)
Med filled.  

## 2012-11-29 DIAGNOSIS — M6530 Trigger finger, unspecified finger: Secondary | ICD-10-CM | POA: Diagnosis not present

## 2012-11-29 DIAGNOSIS — M069 Rheumatoid arthritis, unspecified: Secondary | ICD-10-CM | POA: Diagnosis not present

## 2012-11-30 ENCOUNTER — Telehealth: Payer: Self-pay | Admitting: Internal Medicine

## 2012-11-30 MED ORDER — ZOLPIDEM TARTRATE 10 MG PO TABS: 10.0000 mg | ORAL_TABLET | Freq: Every evening | ORAL | Status: DC | PRN

## 2012-11-30 MED ORDER — TRAZODONE HCL 100 MG PO TABS: 100.0000 mg | ORAL_TABLET | Freq: Every day | ORAL | Status: DC

## 2012-11-30 MED ORDER — TAMSULOSIN HCL 0.4 MG PO CAPS: 0.4000 mg | ORAL_CAPSULE | Freq: Every day | ORAL | Status: DC

## 2012-11-30 MED ORDER — HYDROCHLOROTHIAZIDE 25 MG PO TABS: 25.0000 mg | ORAL_TABLET | Freq: Every day | ORAL | Status: DC

## 2012-11-30 NOTE — Telephone Encounter (Signed)
Spoke to pt's wife and told her Rx 's will be ready for pick up tomorrow for both of them.

## 2012-11-30 NOTE — Telephone Encounter (Signed)
Patient's spouse called stating they are changing to mail order and will need written rxs for hydrochlorothiazide 25mg  1poqd, tamsulosin hcl 0.4mg  1poqd, trazadone hcl tabs 100mg  1po at bedtime, zolpidem tartrate 10 mg 1po at bedtime prn for sleep for a 90 day supply. Please assist.

## 2012-12-21 DIAGNOSIS — N200 Calculus of kidney: Secondary | ICD-10-CM | POA: Diagnosis not present

## 2012-12-21 DIAGNOSIS — N138 Other obstructive and reflux uropathy: Secondary | ICD-10-CM | POA: Diagnosis not present

## 2012-12-21 DIAGNOSIS — N401 Enlarged prostate with lower urinary tract symptoms: Secondary | ICD-10-CM | POA: Diagnosis not present

## 2013-01-30 DIAGNOSIS — M069 Rheumatoid arthritis, unspecified: Secondary | ICD-10-CM | POA: Diagnosis not present

## 2013-03-12 ENCOUNTER — Other Ambulatory Visit: Payer: Self-pay | Admitting: Internal Medicine

## 2013-03-28 DIAGNOSIS — M6530 Trigger finger, unspecified finger: Secondary | ICD-10-CM | POA: Diagnosis not present

## 2013-03-28 DIAGNOSIS — M069 Rheumatoid arthritis, unspecified: Secondary | ICD-10-CM | POA: Diagnosis not present

## 2013-05-28 DIAGNOSIS — M069 Rheumatoid arthritis, unspecified: Secondary | ICD-10-CM | POA: Diagnosis not present

## 2013-05-31 ENCOUNTER — Telehealth: Payer: Self-pay | Admitting: Internal Medicine

## 2013-05-31 MED ORDER — FLUTICASONE PROPIONATE 50 MCG/ACT NA SUSP: NASAL | Status: DC

## 2013-05-31 NOTE — Telephone Encounter (Signed)
Pt  Needs refill of fluticasone (FLONASE) 50 MCG/ACT nasal spray. 90 day. New pharmacy  (new insurance) [facility]    (Acct no: [id number])

## 2013-05-31 NOTE — Telephone Encounter (Signed)
Pt's wife notified Rx was sent to pharmacy as requested.

## 2013-05-31 NOTE — Telephone Encounter (Signed)
Pt  Needs refill of fluticasone (FLONASE) 50 MCG/ACT nasal spray. 90 day. New pharmacy  (new insurance) [facility]    (Acct no: [id number])  

## 2013-07-26 DIAGNOSIS — M069 Rheumatoid arthritis, unspecified: Secondary | ICD-10-CM | POA: Diagnosis not present

## 2013-10-01 DIAGNOSIS — M069 Rheumatoid arthritis, unspecified: Secondary | ICD-10-CM | POA: Diagnosis not present

## 2013-11-27 DIAGNOSIS — M069 Rheumatoid arthritis, unspecified: Secondary | ICD-10-CM | POA: Diagnosis not present

## 2013-11-27 DIAGNOSIS — M79609 Pain in unspecified limb: Secondary | ICD-10-CM | POA: Diagnosis not present

## 2013-11-28 ENCOUNTER — Other Ambulatory Visit: Payer: Self-pay | Admitting: Internal Medicine

## 2013-11-29 ENCOUNTER — Other Ambulatory Visit: Payer: Self-pay | Admitting: *Deleted

## 2013-11-29 MED ORDER — HYDROCHLOROTHIAZIDE 25 MG PO TABS: 25.0000 mg | ORAL_TABLET | Freq: Every day | ORAL | Status: DC

## 2013-12-20 ENCOUNTER — Telehealth: Payer: Self-pay | Admitting: Internal Medicine

## 2013-12-20 NOTE — Telephone Encounter (Signed)
Spoke to pt's wife Neoma Laming, told her I can not refill Trazadone pt has not been seen since 12/2011 he will need to make an appointment. Neoma Laming said she will let him know.

## 2013-12-20 NOTE — Telephone Encounter (Signed)
[facility] requesting refill of traZODone (DESYREL) 100 MG tablet #90, last filled 09/16/13

## 2013-12-24 DIAGNOSIS — N138 Other obstructive and reflux uropathy: Secondary | ICD-10-CM | POA: Diagnosis not present

## 2013-12-24 DIAGNOSIS — N200 Calculus of kidney: Secondary | ICD-10-CM | POA: Diagnosis not present

## 2013-12-24 DIAGNOSIS — N139 Obstructive and reflux uropathy, unspecified: Secondary | ICD-10-CM | POA: Diagnosis not present

## 2013-12-24 DIAGNOSIS — N508 Other specified disorders of male genital organs: Secondary | ICD-10-CM | POA: Diagnosis not present

## 2013-12-24 DIAGNOSIS — N401 Enlarged prostate with lower urinary tract symptoms: Secondary | ICD-10-CM | POA: Diagnosis not present

## 2013-12-25 ENCOUNTER — Encounter: Payer: Self-pay | Admitting: Internal Medicine

## 2013-12-25 ENCOUNTER — Ambulatory Visit (INDEPENDENT_AMBULATORY_CARE_PROVIDER_SITE_OTHER): Payer: BC Managed Care – PPO | Admitting: Internal Medicine

## 2013-12-25 VITALS — BP 140/86 | HR 84 | Temp 98.4°F | Resp 20 | Ht 68.0 in | Wt 185.0 lb

## 2013-12-25 DIAGNOSIS — E739 Lactose intolerance, unspecified: Secondary | ICD-10-CM | POA: Diagnosis not present

## 2013-12-25 DIAGNOSIS — I10 Essential (primary) hypertension: Secondary | ICD-10-CM

## 2013-12-25 DIAGNOSIS — Z23 Encounter for immunization: Secondary | ICD-10-CM | POA: Diagnosis not present

## 2013-12-25 DIAGNOSIS — M069 Rheumatoid arthritis, unspecified: Secondary | ICD-10-CM | POA: Diagnosis not present

## 2013-12-25 MED ORDER — TRAZODONE HCL 100 MG PO TABS: ORAL_TABLET | ORAL | Status: DC

## 2013-12-25 MED ORDER — HYDROCHLOROTHIAZIDE 25 MG PO TABS: 25.0000 mg | ORAL_TABLET | Freq: Every day | ORAL | Status: DC

## 2013-12-25 MED ORDER — FLUTICASONE PROPIONATE 50 MCG/ACT NA SUSP: NASAL | Status: DC

## 2013-12-25 MED ORDER — TAMSULOSIN HCL 0.4 MG PO CAPS: ORAL_CAPSULE | ORAL | Status: DC

## 2013-12-25 NOTE — Progress Notes (Signed)
Pre-visit discussion using our clinic review tool. No additional management support is needed unless otherwise documented below in the visit note.  

## 2013-12-25 NOTE — Patient Instructions (Signed)
Limit your sodium (Salt) intake  Please check your blood pressure on a regular basis.  If it is consistently greater than 150/90, please make an office appointment.     It is important that you exercise regularly, at least 20 minutes 3 to 4 times per week.  If you develop chest pain or shortness of breath seek  medical attention. 

## 2013-12-25 NOTE — Progress Notes (Signed)
Subjective:    Patient ID: [name], male    DOB: May 26, 1946, 68 y.o.   MRN: [id number]  HPI 68 year old patient who is seen today for followup. He has a history of hypertension impaired glucose tolerance as well as RA. He is followed closely by rheumatology with frequent laboratory evaluations. Most recent random blood sugar 105. He feels well today. He has treated hypertension which has been well controlled on diuretic therapy. He also has a history kidney stones and is followed by urology.  Past Medical History  Diagnosis Date  . Rheumatoid arthritis(714.0) 1998  . Nephrolithiasis 1968  . Insomnia   . Allergic rhinitis   . Impaired glucose tolerance   . Hypertension     History   Social History  . Marital Status: Married    Spouse Name: N/A    Number of Children: N/A  . Years of Education: N/A   Occupational History  . Not on file.   Social History Main Topics  . 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 Smokeless tobacco: Never Used     Comment: quit 30 yrs ago  . Alcohol Use: No  . Drug Use: No  . Sexual Activity: Not on file   Other Topics Concern  . Not on file   Social History Narrative  . No narrative on file    Past Surgical History  Procedure Laterality Date  . Status post multiple lithotripy and urological surgery for reccurent calcium oxalate stones      Family History  Problem Relation Age of Onset  . Cancer Mother     pancreatic ; pulmonary embolism   . Diabetes Father     Allergies  Allergen Reactions  . Ibuprofen     REACTION: unspecified  . Penicillins     REACTION: unspecified  . Sulfamethoxazole     REACTION: unspecified    Current Outpatient Prescriptions on File Prior to Visit  Medication Sig Dispense Refill  . folic acid (FOLVITE) 1 MG tablet Take 1 mg by mouth daily.        . meloxicam (MOBIC) 7.5 MG tablet TAKE 1 TABLET TWICE A DAY  90 tablet  3  . methotrexate (RHEUMATREX) 2.5 MG tablet Take 2.5 mg by mouth once a  week. 6 tablets every week Caution:Chemotherapy. Protect from light.        No current facility-administered medications on file prior to visit.    BP 140/86  Pulse 84  Temp(Src) 98.4 F (36.9 C) (Oral)  Resp 20  Ht 5\' 8"  (1.727 m)  Wt 185 lb (83.915 kg)  BMI 28.14 kg/m2  SpO2 97%      Review of Systems  Constitutional: Negative for fever, chills, appetite change and fatigue.  HENT: Negative for congestion, dental problem, ear pain, hearing loss, sore throat, tinnitus, trouble swallowing and voice change.   Eyes: Negative for pain, discharge and visual disturbance.  Respiratory: Negative for cough, chest tightness, wheezing and stridor.   Cardiovascular: Negative for chest pain, palpitations and leg swelling.  Gastrointestinal: Negative for nausea, vomiting, abdominal pain, diarrhea, constipation, blood in stool and abdominal distention.  Genitourinary: Negative for urgency, hematuria, flank pain, discharge, difficulty urinating and genital sores.  Musculoskeletal: Positive for arthralgias. Negative for back pain, gait problem, joint swelling, myalgias and neck stiffness.  Skin: Negative for rash.  Neurological: Negative for dizziness, syncope, speech difficulty, weakness, numbness and headaches.  Hematological: Negative for adenopathy. Does not bruise/bleed easily.  Psychiatric/Behavioral: Negative for behavioral problems and dysphoric mood.  The patient is not nervous/anxious.        Objective:   Physical Exam  Constitutional: He is oriented to person, place, and time. He appears well-developed.  HENT:  Head: Normocephalic.  Right Ear: External ear normal.  Left Ear: External ear normal.  Eyes: Conjunctivae and EOM are normal.  Neck: Normal range of motion.  Cardiovascular: Normal rate and normal heart sounds.   Pulmonary/Chest: Breath sounds normal.  Abdominal: Bowel sounds are normal.  Musculoskeletal: Normal range of motion. He exhibits no edema and no tenderness.    Neurological: He is alert and oriented to person, place, and time.  Psychiatric: He has a normal mood and affect. His behavior is normal.          Assessment & Plan:   Hypertension. Well controlled on diuretic therapy Kidney stones. Stable followup urology RA. Followup rheumatology  Return here in one year or as needed

## 2013-12-25 NOTE — Progress Notes (Signed)
   Subjective:    Patient ID: [name], male    DOB: 08-27-1946, 68 y.o.   MRN: [id number]  HPI  Wt Readings from Last 3 Encounters:  12/25/13 185 lb (83.915 kg)  12/20/11 177 lb (80.287 kg)  05/21/11 173 lb (78.472 kg)     Review of Systems     Objective:   Physical Exam        Assessment & Plan:

## 2013-12-26 ENCOUNTER — Telehealth: Payer: Self-pay | Admitting: Internal Medicine

## 2013-12-26 NOTE — Telephone Encounter (Signed)
Relevant patient education assigned to patient using Emmi. ° °

## 2014-01-28 DIAGNOSIS — M069 Rheumatoid arthritis, unspecified: Secondary | ICD-10-CM | POA: Diagnosis not present

## 2014-03-12 ENCOUNTER — Ambulatory Visit (INDEPENDENT_AMBULATORY_CARE_PROVIDER_SITE_OTHER): Payer: BC Managed Care – PPO | Admitting: Internal Medicine

## 2014-03-12 ENCOUNTER — Encounter: Payer: Self-pay | Admitting: Internal Medicine

## 2014-03-12 VITALS — BP 140/74 | HR 94 | Temp 98.7°F | Resp 20 | Ht 68.0 in | Wt 186.0 lb

## 2014-03-12 DIAGNOSIS — I10 Essential (primary) hypertension: Secondary | ICD-10-CM

## 2014-03-12 DIAGNOSIS — J309 Allergic rhinitis, unspecified: Secondary | ICD-10-CM | POA: Diagnosis not present

## 2014-03-12 DIAGNOSIS — J069 Acute upper respiratory infection, unspecified: Secondary | ICD-10-CM

## 2014-03-12 DIAGNOSIS — B9789 Other viral agents as the cause of diseases classified elsewhere: Secondary | ICD-10-CM

## 2014-03-12 MED ORDER — PREDNISONE 10 MG PO TABS: 10.0000 mg | ORAL_TABLET | Freq: Two times a day (BID) | ORAL | Status: DC

## 2014-03-12 MED ORDER — HYDROCODONE-HOMATROPINE 5-1.5 MG/5ML PO SYRP: 5.0000 mL | ORAL_SOLUTION | Freq: Four times a day (QID) | ORAL | Status: DC | PRN

## 2014-03-12 MED ORDER — PREDNISONE 10 MG PO TABS: ORAL_TABLET | ORAL | Status: DC

## 2014-03-12 NOTE — Patient Instructions (Signed)
Acute bronchitis symptoms are generally not helped by antibiotics.  Take over-the-counter expectorants and cough medications such as  Mucinex DM.  Call if there is no improvement in 5 to 7 days or if he developed worsening cough, fever, or new symptoms, such as shortness of breath or chest pain. 

## 2014-03-12 NOTE — Progress Notes (Signed)
Pre-visit discussion using our clinic review tool. No additional management support is needed unless otherwise documented below in the visit note.  

## 2014-03-12 NOTE — Progress Notes (Signed)
Subjective:    Patient ID: [name], male    DOB: 1946-10-10, 68 y.o.   MRN: [id number]  HPI 68 year old patient has a history of RA, as well as allergic rhinitis.  He presents with a three-week history of a nonproductive refractory cough.  Symptoms initially started as a URI.  The present symptoms include cough only.  No recent fever.  Cough is nonproductive.  No wheezing, or other constitutional complaints Patient has a history of treated hypertension, which has been stable.     Past Medical History  Diagnosis Date  . Rheumatoid arthritis(714.0) 1998  . Nephrolithiasis 1968  . Insomnia   . Allergic rhinitis   . Impaired glucose tolerance   . Hypertension     History   Social History  . Marital Status: Married    Spouse Name: N/A    Number of Children: N/A  . Years of Education: N/A   Occupational History  . Not on file.   Social History Main Topics  . 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 Smokeless tobacco: Never Used     Comment: quit 30 yrs ago  . Alcohol Use: No  . Drug Use: No  . Sexual Activity: Not on file   Other Topics Concern  . Not on file   Social History Narrative  . No narrative on file    Past Surgical History  Procedure Laterality Date  . Status post multiple lithotripy and urological surgery for reccurent calcium oxalate stones      Family History  Problem Relation Age of Onset  . Cancer Mother     pancreatic ; pulmonary embolism   . Diabetes Father     Allergies  Allergen Reactions  . Ibuprofen     REACTION: unspecified  . Penicillins     REACTION: unspecified  . Sulfamethoxazole     REACTION: unspecified    Current Outpatient Prescriptions on File Prior to Visit  Medication Sig Dispense Refill  . fluticasone (FLONASE) 50 MCG/ACT nasal spray USE 2 SPRAYS INTO NOSE DAILY  48 g  3  . folic acid (FOLVITE) 1 MG tablet Take 1 mg by mouth daily.        . hydrochlorothiazide (HYDRODIURIL) 25 MG tablet Take 1 tablet (25 mg total)  by mouth daily.  90 tablet  3  . meloxicam (MOBIC) 7.5 MG tablet TAKE 1 TABLET TWICE A DAY  90 tablet  3  . methotrexate (RHEUMATREX) 2.5 MG tablet Take 2.5 mg by mouth once a week. 6 tablets every week Caution:Chemotherapy. Protect from light.       . tamsulosin (FLOMAX) 0.4 MG CAPS capsule Take 1 capsule by mouth  daily  90 capsule  3  . traZODone (DESYREL) 100 MG tablet TAKE 1 TABLET AT BEDTIME  90 tablet  1   No current facility-administered medications on file prior to visit.    BP 140/74  Pulse 94  Temp(Src) 98.7 F (37.1 C) (Oral)  Resp 20  Ht 5\' 8"  (1.727 m)  Wt 186 lb (84.369 kg)  BMI 28.29 kg/m2  SpO2 97%    Review of Systems  Constitutional: Negative for fever, chills, appetite change and fatigue.  HENT: Negative for congestion, dental problem, ear pain, hearing loss, sore throat, tinnitus, trouble swallowing and voice change.   Eyes: Negative for pain, discharge and visual disturbance.  Respiratory: Positive for cough. Negative for chest tightness, wheezing and stridor.   Cardiovascular: Negative for chest pain, palpitations and leg swelling.  Gastrointestinal: Negative  for nausea, vomiting, abdominal pain, diarrhea, constipation, blood in stool and abdominal distention.  Genitourinary: Negative for urgency, hematuria, flank pain, discharge, difficulty urinating and genital sores.  Musculoskeletal: Negative for arthralgias, back pain, gait problem, joint swelling, myalgias and neck stiffness.  Skin: Negative for rash.  Neurological: Negative for dizziness, syncope, speech difficulty, weakness, numbness and headaches.  Hematological: Negative for adenopathy. Does not bruise/bleed easily.  Psychiatric/Behavioral: Negative for behavioral problems and dysphoric mood. The patient is not nervous/anxious.        Objective:   Physical Exam  Constitutional: He is oriented to person, place, and time. He appears well-developed.  HENT:  Head: Normocephalic.  Right Ear:  External ear normal.  Left Ear: External ear normal.  Eyes: Conjunctivae and EOM are normal.  Neck: Normal range of motion.  Cardiovascular: Normal rate and normal heart sounds.   Pulmonary/Chest: Effort normal and breath sounds normal. No respiratory distress. He has no wheezes. He has no rales.  Abdominal: Bowel sounds are normal.  Musculoskeletal: He exhibits no edema and no tenderness.  Neurological: He is alert and oriented to person, place, and time.  Psychiatric: He has a normal mood and affect. His behavior is normal.          Assessment & Plan:   Resolving URI HTN AR   Will  Treat symptomatically

## 2014-03-28 DIAGNOSIS — M069 Rheumatoid arthritis, unspecified: Secondary | ICD-10-CM | POA: Diagnosis not present

## 2014-05-28 DIAGNOSIS — M069 Rheumatoid arthritis, unspecified: Secondary | ICD-10-CM | POA: Diagnosis not present

## 2014-06-09 ENCOUNTER — Other Ambulatory Visit: Payer: Self-pay | Admitting: Internal Medicine

## 2014-07-29 DIAGNOSIS — M069 Rheumatoid arthritis, unspecified: Secondary | ICD-10-CM | POA: Diagnosis not present

## 2014-08-28 ENCOUNTER — Telehealth: Payer: Self-pay | Admitting: Internal Medicine

## 2014-08-28 NOTE — Telephone Encounter (Signed)
Spoke to pt told him Dr. Raliegh Ip would like him to make an appt to evaluate and treat toe. Pt verbalized understanding and will call back and schedule appt.

## 2014-08-28 NOTE — Telephone Encounter (Signed)
Pt called to say that his right big toe is swollen and is requesting a referral to see a podiatrist.. Would like Dr Raliegh Ip to recommend

## 2014-08-28 NOTE — Telephone Encounter (Signed)
Okay to do referral to Podiatrist?

## 2014-08-28 NOTE — Telephone Encounter (Signed)
Suggest office visit here to evaluate and treat

## 2014-08-29 ENCOUNTER — Encounter: Payer: Self-pay | Admitting: Internal Medicine

## 2014-08-29 ENCOUNTER — Encounter: Payer: Self-pay | Admitting: *Deleted

## 2014-08-29 ENCOUNTER — Ambulatory Visit (INDEPENDENT_AMBULATORY_CARE_PROVIDER_SITE_OTHER): Payer: BC Managed Care – PPO | Admitting: Internal Medicine

## 2014-08-29 VITALS — BP 140/78 | HR 98 | Temp 98.5°F | Resp 20 | Ht 68.0 in | Wt 183.0 lb

## 2014-08-29 DIAGNOSIS — J309 Allergic rhinitis, unspecified: Secondary | ICD-10-CM | POA: Diagnosis not present

## 2014-08-29 DIAGNOSIS — I10 Essential (primary) hypertension: Secondary | ICD-10-CM

## 2014-08-29 DIAGNOSIS — B351 Tinea unguium: Secondary | ICD-10-CM | POA: Diagnosis not present

## 2014-08-29 DIAGNOSIS — M069 Rheumatoid arthritis, unspecified: Secondary | ICD-10-CM

## 2014-08-29 MED ORDER — TRAZODONE HCL 100 MG PO TABS: ORAL_TABLET | ORAL | Status: DC

## 2014-08-29 MED ORDER — HYDROCHLOROTHIAZIDE 25 MG PO TABS: 25.0000 mg | ORAL_TABLET | Freq: Every day | ORAL | Status: DC

## 2014-08-29 MED ORDER — FLUTICASONE PROPIONATE 50 MCG/ACT NA SUSP: NASAL | Status: DC

## 2014-08-29 MED ORDER — TERBINAFINE HCL 250 MG PO TABS: 250.0000 mg | ORAL_TABLET | Freq: Every day | ORAL | Status: DC

## 2014-08-29 MED ORDER — HYDROCODONE-HOMATROPINE 5-1.5 MG/5ML PO SYRP: 5.0000 mL | ORAL_SOLUTION | Freq: Four times a day (QID) | ORAL | Status: DC | PRN

## 2014-08-29 MED ORDER — TAMSULOSIN HCL 0.4 MG PO CAPS: ORAL_CAPSULE | ORAL | Status: DC

## 2014-08-29 NOTE — Progress Notes (Signed)
Subjective:    Patient ID: [name], male    DOB: 1946-09-22, 68 y.o.   MRN: [id number]  HPI  68 year old patient who is followed by rheumatology.  Due to RA.  He presents with a chief complaint of redness and swelling involving his right great toe.  He has had a right great toenail onychomycotic infection and recently he has done some vigorous filing of the nail.  He suffered has developed redness, swelling, and discomfort.  Symptoms have greatly improved He has tried a variety of OTC medications for toenail onychomycosis without benefit  Past Medical History  Diagnosis Date  . Rheumatoid arthritis(714.0) 1998  . Nephrolithiasis 1968  . Insomnia   . Allergic rhinitis   . Impaired glucose tolerance   . Hypertension     History   Social History  . Marital Status: Married    Spouse Name: N/A    Number of Children: N/A  . Years of Education: N/A   Occupational History  . Not on file.   Social History Main Topics  . 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 Smokeless tobacco: Never Used     Comment: quit 30 yrs ago  . Alcohol Use: No  . Drug Use: No  . Sexual Activity: Not on file   Other Topics Concern  . Not on file   Social History Narrative  . No narrative on file    Past Surgical History  Procedure Laterality Date  . Status post multiple lithotripy and urological surgery for reccurent calcium oxalate stones      Family History  Problem Relation Age of Onset  . Cancer Mother     pancreatic ; pulmonary embolism   . Diabetes Father     Allergies  Allergen Reactions  . Ibuprofen     REACTION: unspecified  . Penicillins     REACTION: unspecified  . Sulfamethoxazole     REACTION: unspecified    Current Outpatient Prescriptions on File Prior to Visit  Medication Sig Dispense Refill  . fexofenadine (ALLEGRA) 30 MG tablet Take 30 mg by mouth daily.      . folic acid (FOLVITE) 1 MG tablet Take 1 mg by mouth daily.        . meloxicam (MOBIC) 7.5 MG  tablet TAKE 1 TABLET TWICE A DAY  90 tablet  3  . methotrexate (RHEUMATREX) 2.5 MG tablet Take 2.5 mg by mouth once a week. 8 tablets every week Caution:Chemotherapy. Protect from light.      Marland Kitchen HYDROcodone-homatropine (HYCODAN) 5-1.5 MG/5ML syrup Take 5 mLs by mouth every 6 (six) hours as needed for cough.  120 mL  0   No current facility-administered medications on file prior to visit.    BP 140/78  Pulse 98  Temp(Src) 98.5 F (36.9 C) (Oral)  Resp 20  Ht 5\' 8"  (1.727 m)  Wt 183 lb (83.008 kg)  BMI 27.83 kg/m2  SpO2 97%     Review of Systems  Constitutional: Negative for fever, chills, appetite change and fatigue.  HENT: Negative for congestion, dental problem, ear pain, hearing loss, sore throat, tinnitus, trouble swallowing and voice change.   Eyes: Negative for pain, discharge and visual disturbance.  Respiratory: Negative for cough, chest tightness, wheezing and stridor.   Cardiovascular: Negative for chest pain, palpitations and leg swelling.  Gastrointestinal: Negative for nausea, vomiting, abdominal pain, diarrhea, constipation, blood in stool and abdominal distention.  Genitourinary: Negative for urgency, hematuria, flank pain, discharge, difficulty urinating and genital sores.  Musculoskeletal: Negative for arthralgias, back pain, gait problem, joint swelling, myalgias and neck stiffness.  Skin: Positive for wound. Negative for rash.  Neurological: Negative for dizziness, syncope, speech difficulty, weakness, numbness and headaches.  Hematological: Negative for adenopathy. Does not bruise/bleed easily.  Psychiatric/Behavioral: Negative for behavioral problems and dysphoric mood. The patient is not nervous/anxious.        Objective:   Physical Exam  Constitutional: He is oriented to person, place, and time. He appears well-developed.  HENT:  Head: Normocephalic.  Right Ear: External ear normal.  Left Ear: External ear normal.  Eyes: Conjunctivae and EOM are normal.   Neck: Normal range of motion.  Cardiovascular: Normal rate and normal heart sounds.   Pulmonary/Chest: Breath sounds normal.  Abdominal: Bowel sounds are normal.  Musculoskeletal: Normal range of motion. He exhibits no edema and no tenderness.  Neurological: He is alert and oriented to person, place, and time.  Skin:  Onychomycotic right great toenail Slight soft tissue swelling and erythema about the nail  Psychiatric: He has a normal mood and affect. His behavior is normal.          Assessment & Plan:   Toenail onychomycosis Resolving paronychia Rheumatoid arthritis  We'll treat with Lamisil if no significant drug drug interactions.  We'll review

## 2014-08-29 NOTE — Patient Instructions (Signed)
Ringworm, Nail A fungal infection of the nail (tinea unguium/onychomycosis) is common. It is common as the visible part of the nail is composed of dead cells which have no blood supply to help prevent infection. It occurs because fungi are everywhere and will pick any opportunity to grow on any dead material. Because nails are very slow growing they require up to 2 years of treatment with anti-fungal medications. The entire nail back to the base is infected. This includes approximately  of the nail which you cannot see. If your caregiver has prescribed a medication by mouth, take it every day and as directed. No progress will be seen for at least 6 to 9 months. Do not be disappointed! Because fungi live on dead cells with little or no exposure to blood supply, medication delivery to the infection is slow; thus the cure is slow. It is also why you can observe no progress in the first 6 months. The nail becoming cured is the base of the nail, as it has the blood supply. Topical medication such as creams and ointments are usually not effective. Important in successful treatment of nail fungus is closely following the medication regimen that your doctor prescribes. Sometimes you and your caregiver may elect to speed up this process by surgical removal of all the nails. Even this may still require 6 to 9 months of additional oral medications. See your caregiver as directed. Remember there will be no visible improvement for at least 6 months. See your caregiver sooner if other signs of infection (redness and swelling) develop. Document Released: 11/26/2000 Document Revised: 02/21/2012 Document Reviewed: 02/04/2009 [employer] Patient Information 2015 [employer]. This information is not intended to replace advice given to you by your health care provider. Make sure you discuss any questions you have with your health care provider.  

## 2014-08-29 NOTE — Progress Notes (Signed)
Pre visit review using our clinic review tool, if applicable. No additional management support is needed unless otherwise documented below in the visit note. 

## 2014-08-30 ENCOUNTER — Telehealth: Payer: Self-pay | Admitting: Internal Medicine

## 2014-08-30 NOTE — Telephone Encounter (Signed)
Pharmacy called back and said not to worry about it.

## 2014-08-30 NOTE — Telephone Encounter (Signed)
Pt states he was given rx yesterday for terbinafine (LAMISIL) 250 MG tablet Pt went to get it filled, but it was sent to [facility].  Pt would like to start the meds right away, can this order be cancelled and send to local [facility]? They are saying insurance will not pay b/c it was sent to the mailorder.

## 2014-09-30 DIAGNOSIS — M0589 Other rheumatoid arthritis with rheumatoid factor of multiple sites: Secondary | ICD-10-CM | POA: Diagnosis not present

## 2014-11-28 DIAGNOSIS — M0589 Other rheumatoid arthritis with rheumatoid factor of multiple sites: Secondary | ICD-10-CM | POA: Diagnosis not present

## 2014-12-11 ENCOUNTER — Other Ambulatory Visit: Payer: Self-pay | Admitting: Internal Medicine

## 2014-12-13 HISTORY — PX: CORONARY ANGIOPLASTY WITH STENT PLACEMENT: SHX49

## 2014-12-18 DIAGNOSIS — M0589 Other rheumatoid arthritis with rheumatoid factor of multiple sites: Secondary | ICD-10-CM | POA: Diagnosis not present

## 2014-12-27 DIAGNOSIS — N401 Enlarged prostate with lower urinary tract symptoms: Secondary | ICD-10-CM | POA: Diagnosis not present

## 2014-12-27 DIAGNOSIS — R351 Nocturia: Secondary | ICD-10-CM | POA: Diagnosis not present

## 2014-12-27 DIAGNOSIS — Z87442 Personal history of urinary calculi: Secondary | ICD-10-CM | POA: Diagnosis not present

## 2015-01-18 ENCOUNTER — Other Ambulatory Visit: Payer: Self-pay | Admitting: Internal Medicine

## 2015-02-18 DIAGNOSIS — M0589 Other rheumatoid arthritis with rheumatoid factor of multiple sites: Secondary | ICD-10-CM | POA: Diagnosis not present

## 2015-03-29 ENCOUNTER — Emergency Department (HOSPITAL_COMMUNITY): Payer: BLUE CROSS/BLUE SHIELD

## 2015-03-29 ENCOUNTER — Emergency Department (HOSPITAL_COMMUNITY)
Admission: EM | Admit: 2015-03-29 | Discharge: 2015-03-29 | Disposition: A | Discharge status: Home / Self Care | Payer: BLUE CROSS/BLUE SHIELD | Attending: Emergency Medicine | Admitting: Emergency Medicine

## 2015-03-29 ENCOUNTER — Encounter (HOSPITAL_COMMUNITY): Payer: Self-pay | Admitting: Emergency Medicine

## 2015-03-29 DIAGNOSIS — S299XXA Unspecified injury of thorax, initial encounter: Secondary | ICD-10-CM | POA: Diagnosis not present

## 2015-03-29 DIAGNOSIS — Z79899 Other long term (current) drug therapy: Secondary | ICD-10-CM | POA: Insufficient documentation

## 2015-03-29 DIAGNOSIS — S2232XA Fracture of one rib, left side, initial encounter for closed fracture: Secondary | ICD-10-CM

## 2015-03-29 DIAGNOSIS — Y998 Other external cause status: Secondary | ICD-10-CM | POA: Insufficient documentation

## 2015-03-29 DIAGNOSIS — M79641 Pain in right hand: Secondary | ICD-10-CM | POA: Diagnosis not present

## 2015-03-29 DIAGNOSIS — Z8669 Personal history of other diseases of the nervous system and sense organs: Secondary | ICD-10-CM | POA: Insufficient documentation

## 2015-03-29 DIAGNOSIS — S199XXA Unspecified injury of neck, initial encounter: Secondary | ICD-10-CM | POA: Diagnosis not present

## 2015-03-29 DIAGNOSIS — I10 Essential (primary) hypertension: Secondary | ICD-10-CM | POA: Diagnosis not present

## 2015-03-29 DIAGNOSIS — Z791 Long term (current) use of non-steroidal anti-inflammatories (NSAID): Secondary | ICD-10-CM | POA: Insufficient documentation

## 2015-03-29 DIAGNOSIS — Z880 Allergy status to penicillin: Secondary | ICD-10-CM | POA: Diagnosis not present

## 2015-03-29 DIAGNOSIS — S62620A Displaced fracture of medial phalanx of right index finger, initial encounter for closed fracture: Secondary | ICD-10-CM | POA: Diagnosis not present

## 2015-03-29 DIAGNOSIS — S61209A Unspecified open wound of unspecified finger without damage to nail, initial encounter: Secondary | ICD-10-CM

## 2015-03-29 DIAGNOSIS — S52502A Unspecified fracture of the lower end of left radius, initial encounter for closed fracture: Secondary | ICD-10-CM | POA: Diagnosis not present

## 2015-03-29 DIAGNOSIS — M069 Rheumatoid arthritis, unspecified: Secondary | ICD-10-CM | POA: Diagnosis not present

## 2015-03-29 DIAGNOSIS — R51 Headache: Secondary | ICD-10-CM | POA: Insufficient documentation

## 2015-03-29 DIAGNOSIS — W11XXXA Fall on and from ladder, initial encounter: Secondary | ICD-10-CM | POA: Diagnosis not present

## 2015-03-29 DIAGNOSIS — Y9389 Activity, other specified: Secondary | ICD-10-CM | POA: Insufficient documentation

## 2015-03-29 DIAGNOSIS — S63250A Unspecified dislocation of right index finger, initial encounter: Secondary | ICD-10-CM | POA: Insufficient documentation

## 2015-03-29 DIAGNOSIS — Y9289 Other specified places as the place of occurrence of the external cause: Secondary | ICD-10-CM | POA: Insufficient documentation

## 2015-03-29 DIAGNOSIS — S0990XA Unspecified injury of head, initial encounter: Secondary | ICD-10-CM | POA: Diagnosis not present

## 2015-03-29 DIAGNOSIS — Z87891 Personal history of nicotine dependence: Secondary | ICD-10-CM | POA: Insufficient documentation

## 2015-03-29 DIAGNOSIS — Z7951 Long term (current) use of inhaled steroids: Secondary | ICD-10-CM | POA: Insufficient documentation

## 2015-03-29 DIAGNOSIS — Z87442 Personal history of urinary calculi: Secondary | ICD-10-CM | POA: Diagnosis not present

## 2015-03-29 DIAGNOSIS — S32502A Unspecified fracture of left pubis, initial encounter for closed fracture: Secondary | ICD-10-CM | POA: Diagnosis not present

## 2015-03-29 DIAGNOSIS — S63259A Unspecified dislocation of unspecified finger, initial encounter: Secondary | ICD-10-CM

## 2015-03-29 DIAGNOSIS — Z8709 Personal history of other diseases of the respiratory system: Secondary | ICD-10-CM | POA: Insufficient documentation

## 2015-03-29 DIAGNOSIS — W19XXXA Unspecified fall, initial encounter: Secondary | ICD-10-CM

## 2015-03-29 DIAGNOSIS — S3289XA Fracture of other parts of pelvis, initial encounter for closed fracture: Secondary | ICD-10-CM | POA: Insufficient documentation

## 2015-03-29 DIAGNOSIS — S6991XA Unspecified injury of right wrist, hand and finger(s), initial encounter: Secondary | ICD-10-CM | POA: Diagnosis not present

## 2015-03-29 DIAGNOSIS — R079 Chest pain, unspecified: Secondary | ICD-10-CM | POA: Diagnosis not present

## 2015-03-29 DIAGNOSIS — S52501A Unspecified fracture of the lower end of right radius, initial encounter for closed fracture: Secondary | ICD-10-CM | POA: Diagnosis not present

## 2015-03-29 DIAGNOSIS — S3993XA Unspecified injury of pelvis, initial encounter: Secondary | ICD-10-CM | POA: Diagnosis not present

## 2015-03-29 DIAGNOSIS — S329XXA Fracture of unspecified parts of lumbosacral spine and pelvis, initial encounter for closed fracture: Secondary | ICD-10-CM

## 2015-03-29 DIAGNOSIS — S2242XA Multiple fractures of ribs, left side, initial encounter for closed fracture: Secondary | ICD-10-CM | POA: Insufficient documentation

## 2015-03-29 LAB — CBC WITH DIFFERENTIAL/PLATELET
Basophils Absolute: 0 10*3/uL (ref 0.0–0.1)
Basophils Relative: 0 % (ref 0–1)
EOS PCT: 0 % (ref 0–5)
Eosinophils Absolute: 0 10*3/uL (ref 0.0–0.7)
HCT: 45.9 % (ref 39.0–52.0)
Hemoglobin: 16.8 g/dL (ref 13.0–17.0)
LYMPHS ABS: 0.9 10*3/uL (ref 0.7–4.0)
LYMPHS PCT: 7 % — AB (ref 12–46)
MCH: 32.5 pg (ref 26.0–34.0)
MCHC: 36.6 g/dL — ABNORMAL HIGH (ref 30.0–36.0)
MCV: 88.8 fL (ref 78.0–100.0)
MONO ABS: 0.7 10*3/uL (ref 0.1–1.0)
Monocytes Relative: 5 % (ref 3–12)
Neutro Abs: 11.4 10*3/uL — ABNORMAL HIGH (ref 1.7–7.7)
Neutrophils Relative %: 87 % — ABNORMAL HIGH (ref 43–77)
PLATELETS: 164 10*3/uL (ref 150–400)
RBC: 5.17 MIL/uL (ref 4.22–5.81)
RDW: 13.5 % (ref 11.5–15.5)
WBC: 13.1 10*3/uL — ABNORMAL HIGH (ref 4.0–10.5)

## 2015-03-29 LAB — BASIC METABOLIC PANEL
Anion gap: 11 (ref 5–15)
BUN: 25 mg/dL — ABNORMAL HIGH (ref 6–23)
CALCIUM: 9.7 mg/dL (ref 8.4–10.5)
CHLORIDE: 101 mmol/L (ref 96–112)
CO2: 26 mmol/L (ref 19–32)
Creatinine, Ser: 1.53 mg/dL — ABNORMAL HIGH (ref 0.50–1.35)
GFR calc Af Amer: 52 mL/min — ABNORMAL LOW (ref >90)
GFR calc non Af Amer: 45 mL/min — ABNORMAL LOW (ref >90)
Glucose, Bld: 143 mg/dL — ABNORMAL HIGH (ref 70–99)
Potassium: 3.6 mmol/L (ref 3.5–5.1)
SODIUM: 138 mmol/L (ref 135–145)

## 2015-03-29 LAB — I-STAT CHEM 8, ED
BUN: 30 mg/dL — ABNORMAL HIGH (ref 6–23)
CREATININE: 1.6 mg/dL — AB (ref 0.50–1.35)
Calcium, Ion: 1.18 mmol/L (ref 1.13–1.30)
Chloride: 101 mmol/L (ref 96–112)
Glucose, Bld: 137 mg/dL — ABNORMAL HIGH (ref 70–99)
HEMATOCRIT: 50 % (ref 39.0–52.0)
HEMOGLOBIN: 17 g/dL (ref 13.0–17.0)
POTASSIUM: 3.6 mmol/L (ref 3.5–5.1)
Sodium: 140 mmol/L (ref 135–145)
TCO2: 24 mmol/L (ref 0–100)

## 2015-03-29 MED ORDER — SODIUM CHLORIDE 0.9 % IV SOLN
INTRAVENOUS | Status: DC
  Administered 2015-03-29: 19:00:00 via INTRAVENOUS

## 2015-03-29 MED ORDER — SODIUM CHLORIDE 0.9 % IV SOLN: INTRAVENOUS | Status: DC

## 2015-03-29 MED ORDER — ONDANSETRON HCL 4 MG/2ML IJ SOLN
4.0000 mg | Freq: Once | INTRAMUSCULAR | Status: AC
  Administered 2015-03-29: 4 mg via INTRAVENOUS
  Filled 2015-03-29: qty 2

## 2015-03-29 MED ORDER — LIDOCAINE HCL 2 % IJ SOLN
20.0000 mL | Freq: Once | INTRAMUSCULAR | Status: AC
  Administered 2015-03-29: 400 mg via INTRADERMAL
  Filled 2015-03-29: qty 20

## 2015-03-29 MED ORDER — HYDROCODONE-ACETAMINOPHEN 5-325 MG PO TABS: 1.0000 | ORAL_TABLET | Freq: Four times a day (QID) | ORAL | Status: DC | PRN

## 2015-03-29 MED ORDER — SODIUM CHLORIDE 0.9 % IV BOLUS (SEPSIS)
250.0000 mL | Freq: Once | INTRAVENOUS | Status: AC
  Administered 2015-03-29: 250 mL via INTRAVENOUS

## 2015-03-29 MED ORDER — HYDROMORPHONE HCL 1 MG/ML IJ SOLN
1.0000 mg | Freq: Once | INTRAMUSCULAR | Status: AC
Start: 2015-03-29 — End: 2015-03-29
  Administered 2015-03-29: 1 mg via INTRAVENOUS
  Filled 2015-03-29: qty 1

## 2015-03-29 MED ORDER — IOHEXOL 300 MG/ML  SOLN
100.0000 mL | Freq: Once | INTRAMUSCULAR | Status: AC | PRN
  Administered 2015-03-29: 80 mL via INTRAVENOUS

## 2015-03-29 MED ORDER — DIPHENHYDRAMINE HCL 50 MG/ML IJ SOLN
50.0000 mg | Freq: Once | INTRAMUSCULAR | Status: AC
  Administered 2015-03-29: 50 mg via INTRAVENOUS
  Filled 2015-03-29: qty 1

## 2015-03-29 MED ORDER — HYDROMORPHONE HCL 1 MG/ML IJ SOLN
1.0000 mg | Freq: Once | INTRAMUSCULAR | Status: AC
  Administered 2015-03-29: 1 mg via INTRAVENOUS
  Filled 2015-03-29: qty 1

## 2015-03-29 MED ORDER — METHYLPREDNISOLONE SODIUM SUCC 125 MG IJ SOLR
125.0000 mg | Freq: Once | INTRAMUSCULAR | Status: AC
  Administered 2015-03-29: 125 mg via INTRAVENOUS
  Filled 2015-03-29: qty 2

## 2015-03-29 NOTE — Progress Notes (Signed)
Orthopedic Tech Progress Note Patient Details:  [name] [birth year] [id number]  Ortho Devices Type of Ortho Device: Ace wrap, Arm sling, Sugartong splint Ortho Device/Splint Location: LUE Ortho Device/Splint Interventions: Ordered, Application   Braulio Bosch 03/29/2015, 10:12 PM

## 2015-03-29 NOTE — ED Notes (Signed)
Spoke with Doctor stated to have x-ray of right index finger portable. Notified x-ray.

## 2015-03-29 NOTE — ED Notes (Signed)
Paged Ortho  

## 2015-03-29 NOTE — ED Notes (Signed)
Pt sts he fell 12-13 feet off ladder onto grass. Pt noted to have right hand deformity, laceration. Pt c/o L rib/back pain, L hip pain.

## 2015-03-29 NOTE — Discharge Instructions (Signed)
Cast or Splint Care Casts and splints support injured limbs and keep bones from moving while they heal.  HOME CARE  Keep the cast or splint uncovered during the drying period.  A plaster cast can take 24 to 48 hours to dry.  A fiberglass cast will dry in less than 1 hour.  Do not rest the cast on anything harder than a pillow for 24 hours.  Do not put weight on your injured limb. Do not put pressure on the cast. Wait for your doctor's approval.  Keep the cast or splint dry.  Cover the cast or splint with a plastic bag during baths or wet weather.  If you have a cast over your chest and belly (trunk), take sponge baths until the cast is taken off.  If your cast gets wet, dry it with a towel or blow dryer. Use the cool setting on the blow dryer.  Keep your cast or splint clean. Wash a dirty cast with a damp cloth.  Do not put any objects under your cast or splint.  Do not scratch the skin under the cast with an object. If itching is a problem, use a blow dryer on a cool setting over the itchy area.  Do not trim or cut your cast.  Do not take out the padding from inside your cast.  Exercise your joints near the cast as told by your doctor.  Raise (elevate) your injured limb on 1 or 2 pillows for the first 1 to 3 days. GET HELP IF:  Your cast or splint cracks.  Your cast or splint is too tight or too loose.  You itch badly under the cast.  Your cast gets wet or has a soft spot.  You have a bad smell coming from the cast.  You get an object stuck under the cast.  Your skin around the cast becomes red or sore.  You have new or more pain after the cast is put on. GET HELP RIGHT AWAY IF:  You have fluid leaking through the cast.  You cannot move your fingers or toes.  Your fingers or toes turn blue or white or are cool, painful, or puffy (swollen).  You have tingling or lose feeling (numbness) around the injured area.  You have bad pain or pressure under the  cast.  You have trouble breathing or have shortness of breath.  You have chest pain. Document Released: 03/31/2011 Document Revised: 08/01/2013 Document Reviewed: 06/07/2013 [facility] Patient Information 2015 [place], [place]. This information is not intended to replace advice given to you by your health care provider. Make sure you discuss any questions you have with your health care provider.  Follow-up with hand surgery on Monday, call for appointment time. Follow-up with regular orthopedics for the pelvic fracture set up an appointment. Take pain medicine as needed. Using incentives primary keep your lungs working well. Expect to be sore from the rib fractures and from the pelvic fracture. Keep the splint on the left arm dry. Keep the dressing on the finger over the weekend.

## 2015-03-29 NOTE — ED Notes (Signed)
Patient transported to CT 

## 2015-03-29 NOTE — ED Provider Notes (Addendum)
CSN: [id number]     Arrival date & time 03/29/15  1518 History   First MD Initiated Contact with Patient 03/29/15 1544     Chief Complaint  Patient presents with  . Fall     (Consider location/radiation/quality/duration/timing/severity/associated sxs/prior Treatment) Patient is a 69 y.o. male presenting with fall. The history is provided by the patient.  Fall Associated symptoms include chest pain, headaches and shortness of breath. Pertinent negatives include no abdominal pain.   69 year old male fell 15 feet off a ladder landing on his left side. Had the wind knocked out of him. Questionable loss of consciousness. Patient with complaint of left chest pain low back pain left hip pain left wrist pain and the cut to his right index finger with deformity. The patient fell onto grass. Patient brought himself to the emergency department with family. Tetanus is up-to-date.  Past Medical History  Diagnosis Date  . Rheumatoid arthritis(714.0) 1998  . Nephrolithiasis 1968  . Insomnia   . Allergic rhinitis   . Impaired glucose tolerance   . Hypertension    Past Surgical History  Procedure Laterality Date  . Status post multiple lithotripy and urological surgery for reccurent calcium oxalate stones     Family History  Problem Relation Age of Onset  . Cancer Mother     pancreatic ; pulmonary embolism   . Diabetes Father    History  Substance Use Topics  . 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 Smokeless tobacco: Never Used     Comment: quit 30 yrs ago  . Alcohol Use: No    Review of Systems  Constitutional: Negative for fever.  HENT: Negative for congestion.   Eyes: Negative for visual disturbance.  Respiratory: Positive for shortness of breath.   Cardiovascular: Positive for chest pain.  Gastrointestinal: Negative for nausea, vomiting and abdominal pain.  Genitourinary: Negative for hematuria.  Musculoskeletal: Positive for back pain. Negative for neck pain.  Skin: Positive for  wound.  Neurological: Positive for headaches.  Hematological: Does not bruise/bleed easily.  Psychiatric/Behavioral: Negative for confusion.      Allergies  Contrast media; Ibuprofen; Penicillins; and Sulfamethoxazole  Home Medications   Prior to Admission medications   Medication Sig Start Date End Date Taking? Authorizing Provider  Cetirizine HCl 10 MG CAPS Take 10 mg by mouth daily.   Yes Historical Provider, MD  Cholecalciferol (VITAMIN D-3) 1000 UNITS CAPS Take 1,000 Units by mouth daily.   Yes Historical Provider, MD  Coenzyme Q10 (CO Q 10) 10 MG CAPS Take 10 mg by mouth daily.   Yes Historical Provider, MD  fluticasone (FLONASE) 50 MCG/ACT nasal spray USE 2 SPRAYS INTO NOSE DAILY 08/29/14  Yes Marletta Lor, MD  folic acid (FOLVITE) 1 MG tablet Take 1 mg by mouth daily.     Yes Historical Provider, MD  hydrochlorothiazide (HYDRODIURIL) 25 MG tablet TAKE 1 TABLET DAILY 12/11/14  Yes Marletta Lor, MD  meloxicam (MOBIC) 7.5 MG tablet TAKE 1 TABLET TWICE A DAY 06/29/12  Yes Marletta Lor, MD  methotrexate (RHEUMATREX) 2.5 MG tablet Take 2.5 mg by mouth once a week. 8 tablets every week Caution:Chemotherapy. Protect from light.   Yes Historical Provider, MD  oxymetazoline (AFRIN) 0.05 % nasal spray Place 1 spray into both nostrils 2 (two) times daily.   Yes Historical Provider, MD  Probiotic Product (PRO-BIOTIC BLEND PO) Take 1 tablet by mouth daily.   Yes Historical Provider, MD  saw palmetto 160 MG capsule Take 160 mg by mouth  2 (two) times daily.   Yes Historical Provider, MD  tamsulosin (FLOMAX) 0.4 MG CAPS capsule TAKE 1 CAPSULE DAILY 12/11/14  Yes Marletta Lor, MD  traMADol (ULTRAM) 50 MG tablet Take 50 mg by mouth every 6 (six) hours as needed for moderate pain.   Yes Historical Provider, MD  traZODone (DESYREL) 100 MG tablet TAKE 1 TABLET AT BEDTIME 01/18/15  Yes Marletta Lor, MD  fexofenadine (ALLEGRA) 30 MG tablet Take 30 mg by mouth daily.     Historical Provider, MD  HYDROcodone-acetaminophen (NORCO/VICODIN) 5-325 MG per tablet Take 1-2 tablets by mouth every 6 (six) hours as needed for moderate pain. 03/29/15   Fredia Sorrow, MD  HYDROcodone-homatropine (HYCODAN) 5-1.5 MG/5ML syrup Take 5 mLs by mouth every 6 (six) hours as needed for cough. 08/29/14   Marletta Lor, MD  terbinafine (LAMISIL) 250 MG tablet Take 1 tablet (250 mg total) by mouth daily. 08/29/14   Marletta Lor, MD   BP 127/73 mmHg  Pulse 92  Temp(Src) 97.6 F (36.4 C) (Oral)  Resp 17  SpO2 96% Physical Exam  Constitutional: He is oriented to person, place, and time. He appears well-developed and well-nourished. He appears distressed.  HENT:  Head: Normocephalic and atraumatic.  Mouth/Throat: Oropharynx is clear and moist.  Eyes: Conjunctivae and EOM are normal. Pupils are equal, round, and reactive to light.  Neck: Normal range of motion.  Cardiovascular: Normal rate, regular rhythm and normal heart sounds.   Pulmonary/Chest: Effort normal and breath sounds normal. He exhibits tenderness.  Left-sided chest wall tenderness without crepitance.  Abdominal: Soft. Bowel sounds are normal. There is no tenderness.  Musculoskeletal: He exhibits tenderness.  Tenderness to left wrist. With some swelling. Radial pulses 2+. Tenderness to palpation around the left hip no obvious deformity. Pelvis is stable.  3 cm laceration to the right index finger on the palmar side at the PIP joint. Deformity to the PIP joint. Bone sticking out of the wound. Refill 2+ to the finger radial pulse of the right wrist is 2+.  Neurological: He is alert and oriented to person, place, and time. No cranial nerve deficit. He exhibits normal muscle tone. Coordination normal.  Skin: Skin is warm.  Nursing note and vitals reviewed.   ED Course  Procedures (including critical care time) Labs Review Labs Reviewed  CBC WITH DIFFERENTIAL/PLATELET - Abnormal; Notable for the following:     WBC 13.1 (*)    MCHC 36.6 (*)    Neutrophils Relative % 87 (*)    Neutro Abs 11.4 (*)    Lymphocytes Relative 7 (*)    All other components within normal limits  BASIC METABOLIC PANEL - Abnormal; Notable for the following:    Glucose, Bld 143 (*)    BUN 25 (*)    Creatinine, Ser 1.53 (*)    GFR calc non Af Amer 45 (*)    GFR calc Af Amer 52 (*)    All other components within normal limits  I-STAT CHEM 8, ED - Abnormal; Notable for the following:    BUN 30 (*)    Creatinine, Ser 1.60 (*)    Glucose, Bld 137 (*)    All other components within normal limits   Results for orders placed or performed during the hospital encounter of 03/29/15  CBC with Differential/Platelet  Result Value Ref Range   WBC 13.1 (H) 4.0 - 10.5 K/uL   RBC 5.17 4.22 - 5.81 MIL/uL   Hemoglobin 16.8 13.0 - 17.0 g/dL  HCT 45.9 39.0 - 52.0 %   MCV 88.8 78.0 - 100.0 fL   MCH 32.5 26.0 - 34.0 pg   MCHC 36.6 (H) 30.0 - 36.0 g/dL   RDW 13.5 11.5 - 15.5 %   Platelets 164 150 - 400 K/uL   Neutrophils Relative % 87 (H) 43 - 77 %   Neutro Abs 11.4 (H) 1.7 - 7.7 K/uL   Lymphocytes Relative 7 (L) 12 - 46 %   Lymphs Abs 0.9 0.7 - 4.0 K/uL   Monocytes Relative 5 3 - 12 %   Monocytes Absolute 0.7 0.1 - 1.0 K/uL   Eosinophils Relative 0 0 - 5 %   Eosinophils Absolute 0.0 0.0 - 0.7 K/uL   Basophils Relative 0 0 - 1 %   Basophils Absolute 0.0 0.0 - 0.1 K/uL  Basic metabolic panel  Result Value Ref Range   Sodium 138 135 - 145 mmol/L   Potassium 3.6 3.5 - 5.1 mmol/L   Chloride 101 96 - 112 mmol/L   CO2 26 19 - 32 mmol/L   Glucose, Bld 143 (H) 70 - 99 mg/dL   BUN 25 (H) 6 - 23 mg/dL   Creatinine, Ser 1.53 (H) 0.50 - 1.35 mg/dL   Calcium 9.7 8.4 - 10.5 mg/dL   GFR calc non Af Amer 45 (L) >90 mL/min   GFR calc Af Amer 52 (L) >90 mL/min   Anion gap 11 5 - 15  I-Stat Chem 8, ED  Result Value Ref Range   Sodium 140 135 - 145 mmol/L   Potassium 3.6 3.5 - 5.1 mmol/L   Chloride 101 96 - 112 mmol/L   BUN 30 (H) 6  - 23 mg/dL   Creatinine, Ser 1.60 (H) 0.50 - 1.35 mg/dL   Glucose, Bld 137 (H) 70 - 99 mg/dL   Calcium, Ion 1.18 1.13 - 1.30 mmol/L   TCO2 24 0 - 100 mmol/L   Hemoglobin 17.0 13.0 - 17.0 g/dL   HCT 50.0 39.0 - 52.0 %     Imaging Review Dg Wrist Complete Left  03/29/2015   CLINICAL DATA:  [place] off a ladder today. Left wrist, right hand and chest pain.  EXAM: LEFT WRIST - COMPLETE 3+ VIEW; RIGHT HAND - COMPLETE 3+ VIEW; PORTABLE CHEST - 1 VIEW  COMPARISON:  None.  FINDINGS: Chest x-ray:  The cardiac silhouette, mediastinal and hilar contours are normal. The lungs are clear. No pleural effusion or pneumothorax. The bony thorax is grossly intact.  Left wrist:  Mild degenerative changes are noted. There is a nondisplaced dorsal cortical fracture involving the distal radius with overlying soft tissue swelling/hematoma. The carpal bones are intact.  Right hand: There is a dislocation noted at the PIP joint of the index finger. The middle phalanx is dislocated posteriorly. There are degenerative changes involving the wrist but no definite wrist fracture.  IMPRESSION: 1. No acute cardiopulmonary findings. 2. Nondisplaced dorsal cortical fracture involving the distal radius of the left wrist. 3. PIP joint dislocation of the right index finger.   Electronically Signed   By: Marijo Sanes M.D.   On: 03/29/2015 16:47   Ct Head Wo Contrast  03/29/2015   CLINICAL DATA:  Patient status post fall from ladder.  EXAM: CT HEAD WITHOUT CONTRAST  CT CERVICAL SPINE WITHOUT CONTRAST  TECHNIQUE: Multidetector CT imaging of the head and cervical spine was performed following the standard protocol without intravenous contrast. Multiplanar CT image reconstructions of the cervical spine were also generated.  COMPARISON:  None.  FINDINGS: CT HEAD FINDINGS  Ventricles and sulci are appropriate for patient's age. No evidence for acute cortically based infarct, intracranial hemorrhage, mass lesion or mass-effect. Mucosal thickening  within the right maxillary sinus. Mastoid air cells are unremarkable. Calvarium is intact.  CT CERVICAL SPINE FINDINGS  Normal anatomic alignment of the cervical spine. Multilevel degenerative disc disease most pronounced C4-5 and C5-6. Craniocervical junction is unremarkable. Prevertebral soft tissues are unremarkable. No evidence for acute cervical spine fracture.  IMPRESSION: No acute intracranial process.  No acute cervical spine fracture.  Cervical spine degenerative changes.   Electronically Signed   By: Lovey Newcomer M.D.   On: 03/29/2015 19:52   Ct Chest W Contrast  03/29/2015   CLINICAL DATA:  [place] 12 to 13 feet from a ladder and landed on his left side. Left chest pain, mid back pain and left hip pain extending into the left groin. Premedicated for a history of contrast allergy consisting of hives.  EXAM: CT CHEST, ABDOMEN, AND PELVIS WITH CONTRAST  TECHNIQUE: Multidetector CT imaging of the chest, abdomen and pelvis was performed following the standard protocol during bolus administration of intravenous contrast.  CONTRAST:  35m OMNIPAQUE IOHEXOL 300 MG/ML  SOLN  COMPARISON:  Portable chest obtained earlier today. Abdomen and pelvis CT dated 10/22/2008.  FINDINGS: CT CHEST FINDINGS  The patient reportedly did not experience any contrast reaction today. Mild atelectasis at both lung bases. Otherwise, clear lungs. Minimal left pleural fluid. Essentially nondisplaced fractures of the lateral aspects of the left fourth and fifth ribs. Mildly displaced fracture of the lateral aspect of the left sixth rib with a small amount of associated intercostal air. Mild underlying linear atelectasis. No pneumothorax seen.  CT ABDOMEN AND PELVIS FINDINGS  Mildly displaced fracture of the left inferior pubic ramus. There is also an essentially nondisplaced, comminuted fracture involving the proximal left ischium and inferior acetabulum without extension into the hip joints. No other fractures are seen. A proximal left  femoral bone island is instantly noted.  Mild scoliosis. Lumbar spine degenerative changes. Mild diffuse low density of the liver relative to the spleen. Small liver cysts. Unremarkable spleen, pancreas, gallbladder, adrenal glands, kidneys, ureters and urinary bladder. Mildly enlarged prostate gland containing coarse central calcifications. Small bilateral inguinal hernias containing fat. Multiple colonic diverticula without evidence of diverticulitis. No free peritoneal fluid or air. Small umbilical hernia containing fat.  IMPRESSION: 1. Left lateral fourth, fifth and sixth rib fractures, as described above. 2. Minimal left pleural blood. 3. No pneumothorax seen. 4. Mildly displaced left inferior pubic ramus fracture. 5. Essentially nondisplaced common comminuted fracture of the proximal left ischium and inferior acetabulum without intra-articular extension. 6. Diffuse hepatic steatosis. 7. Small bilateral inguinal hernias and small umbilical hernia containing fat. 8. Colonic diverticulosis.   Electronically Signed   By: SClaudie ReveringM.D.   On: 03/29/2015 19:57   Ct Cervical Spine Wo Contrast  03/29/2015   CLINICAL DATA:  Patient status post fall from ladder.  EXAM: CT HEAD WITHOUT CONTRAST  CT CERVICAL SPINE WITHOUT CONTRAST  TECHNIQUE: Multidetector CT imaging of the head and cervical spine was performed following the standard protocol without intravenous contrast. Multiplanar CT image reconstructions of the cervical spine were also generated.  COMPARISON:  None.  FINDINGS: CT HEAD FINDINGS  Ventricles and sulci are appropriate for patient's age. No evidence for acute cortically based infarct, intracranial hemorrhage, mass lesion or mass-effect. Mucosal thickening within the right maxillary sinus. Mastoid air cells are unremarkable. Calvarium is  intact.  CT CERVICAL SPINE FINDINGS  Normal anatomic alignment of the cervical spine. Multilevel degenerative disc disease most pronounced C4-5 and C5-6.  Craniocervical junction is unremarkable. Prevertebral soft tissues are unremarkable. No evidence for acute cervical spine fracture.  IMPRESSION: No acute intracranial process.  No acute cervical spine fracture.  Cervical spine degenerative changes.   Electronically Signed   By: Lovey Newcomer M.D.   On: 03/29/2015 19:52   Ct Abdomen Pelvis W Contrast  03/29/2015   CLINICAL DATA:  [place] 12 to 13 feet from a ladder and landed on his left side. Left chest pain, mid back pain and left hip pain extending into the left groin. Premedicated for a history of contrast allergy consisting of hives.  EXAM: CT CHEST, ABDOMEN, AND PELVIS WITH CONTRAST  TECHNIQUE: Multidetector CT imaging of the chest, abdomen and pelvis was performed following the standard protocol during bolus administration of intravenous contrast.  CONTRAST:  51m OMNIPAQUE IOHEXOL 300 MG/ML  SOLN  COMPARISON:  Portable chest obtained earlier today. Abdomen and pelvis CT dated 10/22/2008.  FINDINGS: CT CHEST FINDINGS  The patient reportedly did not experience any contrast reaction today. Mild atelectasis at both lung bases. Otherwise, clear lungs. Minimal left pleural fluid. Essentially nondisplaced fractures of the lateral aspects of the left fourth and fifth ribs. Mildly displaced fracture of the lateral aspect of the left sixth rib with a small amount of associated intercostal air. Mild underlying linear atelectasis. No pneumothorax seen.  CT ABDOMEN AND PELVIS FINDINGS  Mildly displaced fracture of the left inferior pubic ramus. There is also an essentially nondisplaced, comminuted fracture involving the proximal left ischium and inferior acetabulum without extension into the hip joints. No other fractures are seen. A proximal left femoral bone island is instantly noted.  Mild scoliosis. Lumbar spine degenerative changes. Mild diffuse low density of the liver relative to the spleen. Small liver cysts. Unremarkable spleen, pancreas, gallbladder, adrenal  glands, kidneys, ureters and urinary bladder. Mildly enlarged prostate gland containing coarse central calcifications. Small bilateral inguinal hernias containing fat. Multiple colonic diverticula without evidence of diverticulitis. No free peritoneal fluid or air. Small umbilical hernia containing fat.  IMPRESSION: 1. Left lateral fourth, fifth and sixth rib fractures, as described above. 2. Minimal left pleural blood. 3. No pneumothorax seen. 4. Mildly displaced left inferior pubic ramus fracture. 5. Essentially nondisplaced common comminuted fracture of the proximal left ischium and inferior acetabulum without intra-articular extension. 6. Diffuse hepatic steatosis. 7. Small bilateral inguinal hernias and small umbilical hernia containing fat. 8. Colonic diverticulosis.   Electronically Signed   By: SClaudie ReveringM.D.   On: 03/29/2015 19:57   Dg Pelvis Portable  03/29/2015   CLINICAL DATA:  F[place]12 feet off a ladder onto left side  EXAM: PORTABLE PELVIS 1-2 VIEWS  COMPARISON:  None.  FINDINGS: Pelvic bones appear intact without fracture. There is a 15 x 5 mm density over the intertrochanteric region of the left femur with no donor site to suggest fracture. This may represent material that is external to the patient. There is also the suggestion of subtle cortical irregularity involving the inner wall the left acetabulum.  IMPRESSION: Possible subtle fracture of left acetabulum. Recommend CT scan of the pelvis. Density of uncertain origin projecting over the intertrochanteric left femur may be external to the patient. Recommend ensuring that pelvic CT scan covers this region of anatomy if it is performed; alternatively, this could be evaluated with internal and external rotated views of the femur.  Electronically Signed   By: Skipper Cliche M.D.   On: 03/29/2015 16:42   Dg Chest Port 1 View  03/29/2015   CLINICAL DATA:  [place] off a ladder today. Left wrist, right hand and chest pain.  EXAM: LEFT WRIST -  COMPLETE 3+ VIEW; RIGHT HAND - COMPLETE 3+ VIEW; PORTABLE CHEST - 1 VIEW  COMPARISON:  None.  FINDINGS: Chest x-ray:  The cardiac silhouette, mediastinal and hilar contours are normal. The lungs are clear. No pleural effusion or pneumothorax. The bony thorax is grossly intact.  Left wrist:  Mild degenerative changes are noted. There is a nondisplaced dorsal cortical fracture involving the distal radius with overlying soft tissue swelling/hematoma. The carpal bones are intact.  Right hand: There is a dislocation noted at the PIP joint of the index finger. The middle phalanx is dislocated posteriorly. There are degenerative changes involving the wrist but no definite wrist fracture.  IMPRESSION: 1. No acute cardiopulmonary findings. 2. Nondisplaced dorsal cortical fracture involving the distal radius of the left wrist. 3. PIP joint dislocation of the right index finger.   Electronically Signed   By: Marijo Sanes M.D.   On: 03/29/2015 16:47   Dg Hand Complete Right  03/29/2015   CLINICAL DATA:  [place] off a ladder today. Left wrist, right hand and chest pain.  EXAM: LEFT WRIST - COMPLETE 3+ VIEW; RIGHT HAND - COMPLETE 3+ VIEW; PORTABLE CHEST - 1 VIEW  COMPARISON:  None.  FINDINGS: Chest x-ray:  The cardiac silhouette, mediastinal and hilar contours are normal. The lungs are clear. No pleural effusion or pneumothorax. The bony thorax is grossly intact.  Left wrist:  Mild degenerative changes are noted. There is a nondisplaced dorsal cortical fracture involving the distal radius with overlying soft tissue swelling/hematoma. The carpal bones are intact.  Right hand: There is a dislocation noted at the PIP joint of the index finger. The middle phalanx is dislocated posteriorly. There are degenerative changes involving the wrist but no definite wrist fracture.  IMPRESSION: 1. No acute cardiopulmonary findings. 2. Nondisplaced dorsal cortical fracture involving the distal radius of the left wrist. 3. PIP joint dislocation  of the right index finger.   Electronically Signed   By: Marijo Sanes M.D.   On: 03/29/2015 16:47     EKG Interpretation   Date/Time:  Saturday March 29 2015 15:40:47 EDT Ventricular Rate:  93 PR Interval:  152 QRS Duration: 89 QT Interval:  411 QTC Calculation: 511 R Axis:   86 Text Interpretation:  Sinus rhythm Probable left atrial enlargement  Borderline right axis deviation Borderline T wave abnormalities Prolonged  QT interval No significant change since last tracing Confirmed by  Kemiah Booz  MD, [place] [phone]) on 03/29/2015 3:54:40 PM      Reduction of dislocation Date/Time: 9:51 PM Performed by: Fredia Sorrow Authorized by: Fredia Sorrow Consent: Verbal consent obtained. Risks and benefits: risks, benefits and alternatives were discussed Consent given by: patient Required items: required blood products, implants, devices, and special equipment available Time out: Immediately prior to procedure a "time out" was called to verify the correct patient, procedure, equipment, support staff and site/side marked as required.  Patient sedated: patient not sedated finger block to the right index done with 2% plain lidocaine.   Vitals: Vital signs were monitored during sedation. Patient tolerance: Patient tolerated the procedure well with no immediate complications. Joint:  open dislocation of the right index finger at the PIP joint. Reduction technique: manual reduction.    CRITICAL CARE Performed by: Fredia Sorrow  Total critical care time: 30 Critical care time was exclusive of separately billable procedures and treating other patients. Critical care was necessary to treat or prevent imminent or life-threatening deterioration. Critical care was time spent personally by me on the following activities: development of treatment plan with patient and/or surrogate as well as nursing, discussions with consultants, evaluation of patient's response to treatment, examination of  patient, obtaining history from patient or surrogate, ordering and performing treatments and interventions, ordering and review of laboratory studies, ordering and review of radiographic studies, pulse oximetry and re-evaluation of patient's condition.  MDM   Final diagnoses:  Rib fractures, left, closed, initial encounter  Distal radius fracture, left, closed, initial encounter  Open finger dislocation, initial encounter  Pelvic fracture, closed, initial encounter    The patient was significant fall off a ladder. Came in by POV. Patient able to walk in on his own. Patient met criteria for level II trauma. Patient fell approximately 15 feet. Landing on his left side had the wind knocked out of him. Patient with complaint of left hip pain back pain left-sided chest pain, left wrist pain and has a deformity to his right index finger with an open wound.  Workup revealed a right index finger open PIP dislocation. Wound was irrigated finger block done dislocation reduced. Discussed with Caralyn Guile on for hand surgery and recommended not closing the wound. Splint and dressing applied they will follow him up on Monday.  Left distal radius consistent with apical type fracture. Treated with sugar tong splint will follow-up with hand surgery.  CT of the chest showed rib fractures, fourth fifth and sixth ribs no underlying lung injury.  CT of the abdomen and pelvis showed the pelvic fracture without significant displacement. No hip fracture.  No significant internal injuries. Head CT and CT of the neck without any acute findings.  Post reduction films show appropriate alignment of the right index finger. The dislocation has been corrected.  Fredia Sorrow, MD 03/29/15 1518  Fredia Sorrow, MD 03/29/15 2231

## 2015-03-29 NOTE — ED Notes (Signed)
Spoke with xray will be send for patient shortly.

## 2015-03-29 NOTE — ED Notes (Signed)
Spoke with Ortho who will apply splint.

## 2015-03-29 NOTE — ED Notes (Signed)
Pt back from CT

## 2015-03-31 DIAGNOSIS — S52532A Colles' fracture of left radius, initial encounter for closed fracture: Secondary | ICD-10-CM | POA: Diagnosis not present

## 2015-03-31 DIAGNOSIS — S63280A Dislocation of proximal interphalangeal joint of right index finger, initial encounter: Secondary | ICD-10-CM | POA: Diagnosis not present

## 2015-04-15 DIAGNOSIS — S62663D Nondisplaced fracture of distal phalanx of left middle finger, subsequent encounter for fracture with routine healing: Secondary | ICD-10-CM | POA: Diagnosis not present

## 2015-04-15 DIAGNOSIS — S32502D Unspecified fracture of left pubis, subsequent encounter for fracture with routine healing: Secondary | ICD-10-CM | POA: Diagnosis not present

## 2015-04-15 DIAGNOSIS — S52532D Colles' fracture of left radius, subsequent encounter for closed fracture with routine healing: Secondary | ICD-10-CM | POA: Diagnosis not present

## 2015-04-21 DIAGNOSIS — M0589 Other rheumatoid arthritis with rheumatoid factor of multiple sites: Secondary | ICD-10-CM | POA: Diagnosis not present

## 2015-04-29 DIAGNOSIS — S52532D Colles' fracture of left radius, subsequent encounter for closed fracture with routine healing: Secondary | ICD-10-CM | POA: Diagnosis not present

## 2015-04-29 DIAGNOSIS — S63270D Dislocation of unspecified interphalangeal joint of right index finger, subsequent encounter: Secondary | ICD-10-CM | POA: Diagnosis not present

## 2015-06-09 ENCOUNTER — Other Ambulatory Visit: Payer: Self-pay

## 2015-06-18 ENCOUNTER — Other Ambulatory Visit: Payer: Self-pay | Admitting: Internal Medicine

## 2015-07-28 ENCOUNTER — Encounter: Payer: Self-pay | Admitting: Internal Medicine

## 2015-07-28 ENCOUNTER — Ambulatory Visit (INDEPENDENT_AMBULATORY_CARE_PROVIDER_SITE_OTHER): Payer: BLUE CROSS/BLUE SHIELD | Admitting: Internal Medicine

## 2015-07-28 VITALS — BP 124/80 | HR 94 | Temp 98.4°F | Resp 20 | Ht 68.0 in | Wt 184.0 lb

## 2015-07-28 DIAGNOSIS — J3089 Other allergic rhinitis: Secondary | ICD-10-CM | POA: Diagnosis not present

## 2015-07-28 DIAGNOSIS — H109 Unspecified conjunctivitis: Secondary | ICD-10-CM | POA: Diagnosis not present

## 2015-07-28 DIAGNOSIS — I10 Essential (primary) hypertension: Secondary | ICD-10-CM

## 2015-07-28 MED ORDER — NEOMYCIN-POLYMYXIN-HC 3.5-10000-1 OP SUSP: 2.0000 [drp] | OPHTHALMIC | Status: DC

## 2015-07-28 MED ORDER — TAMSULOSIN HCL 0.4 MG PO CAPS: 0.4000 mg | ORAL_CAPSULE | Freq: Every day | ORAL | Status: DC

## 2015-07-28 MED ORDER — TRAZODONE HCL 100 MG PO TABS: 100.0000 mg | ORAL_TABLET | Freq: Every day | ORAL | Status: DC

## 2015-07-28 MED ORDER — MELOXICAM 7.5 MG PO TABS: 7.5000 mg | ORAL_TABLET | Freq: Two times a day (BID) | ORAL | Status: DC

## 2015-07-28 MED ORDER — HYDROCHLOROTHIAZIDE 25 MG PO TABS: 25.0000 mg | ORAL_TABLET | Freq: Every day | ORAL | Status: DC

## 2015-07-28 NOTE — Progress Notes (Signed)
Subjective:    Patient ID: [name], male    DOB: 07-29-1946, 69 y.o.   MRN: [id number]  HPI  69 year old patient who has essential hypertension and also a history of allergic rhinitis.  Yesterday he developed some redness, foreign body sensation and mild crusting involving the left eye only.  He has been using cold compresses and saline eyedrops.  No change in visual acuity.  No eye pain. He does have a sulfa allergy and has been on methotrexate for RA  Past Medical History  Diagnosis Date  . Rheumatoid arthritis(714.0) 1998  . Nephrolithiasis 1968  . Insomnia   . Allergic rhinitis   . Impaired glucose tolerance   . Hypertension     Social History   Social History  . Marital Status: Married    Spouse Name: N/A  . Number of Children: N/A  . Years of Education: N/A   Occupational History  . Not on file.   Social History Main Topics  . 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 Smokeless tobacco: Never Used     Comment: quit 30 yrs ago  . Alcohol Use: No  . Drug Use: No  . Sexual Activity: Not on file   Other Topics Concern  . Not on file   Social History Narrative    Past Surgical History  Procedure Laterality Date  . Status post multiple lithotripy and urological surgery for reccurent calcium oxalate stones      Family History  Problem Relation Age of Onset  . Cancer Mother     pancreatic ; pulmonary embolism   . Diabetes Father     Allergies  Allergen Reactions  . Contrast Media [Iodinated Diagnostic Agents] Hives    03/28/14 pt received 1 hour emergent prep and pt did good and had no complaints after scan.  . Ibuprofen     REACTION: unspecified  . Penicillins     REACTION: unspecified  . Sulfamethoxazole     REACTION: unspecified    Current Outpatient Prescriptions on File Prior to Visit  Medication Sig Dispense Refill  . Cetirizine HCl 10 MG CAPS Take 10 mg by mouth daily.    . Cholecalciferol (VITAMIN D-3) 1000 UNITS CAPS Take 1,000 Units  by mouth daily.    . Coenzyme Q10 (CO Q 10) 10 MG CAPS Take 10 mg by mouth daily.    . fexofenadine (ALLEGRA) 30 MG tablet Take 30 mg by mouth daily.    . fluticasone (FLONASE) 50 MCG/ACT nasal spray USE 2 SPRAYS INTO NOSE DAILY 48 g 3  . folic acid (FOLVITE) 1 MG tablet Take 1 mg by mouth daily.      Marland Kitchen HYDROcodone-acetaminophen (NORCO/VICODIN) 5-325 MG per tablet Take 1-2 tablets by mouth every 6 (six) hours as needed for moderate pain. 20 tablet 0  . methotrexate (RHEUMATREX) 2.5 MG tablet Take 2.5 mg by mouth once a week. 8 tablets every week Caution:Chemotherapy. Protect from light.    Marland Kitchen oxymetazoline (AFRIN) 0.05 % nasal spray Place 1 spray into both nostrils 2 (two) times daily.    . Probiotic Product (PRO-BIOTIC BLEND PO) Take 1 tablet by mouth daily.    . saw palmetto 160 MG capsule Take 160 mg by mouth 2 (two) times daily.    . traMADol (ULTRAM) 50 MG tablet Take 50 mg by mouth every 6 (six) hours as needed for moderate pain.     No current facility-administered medications on file prior to visit.    BP 124/80 mmHg  Pulse 94  Temp(Src) 98.4 F (36.9 C) (Oral)  Resp 20  Ht 5\' 8"  (1.727 m)  Wt 184 lb (83.462 kg)  BMI 27.98 kg/m2  SpO2 98%     Review of Systems  Constitutional: Negative for fever, chills, appetite change and fatigue.  HENT: Positive for congestion. Negative for dental problem, ear pain, hearing loss, sore throat, tinnitus, trouble swallowing and voice change.   Eyes: Positive for discharge, redness and itching. Negative for photophobia, pain and visual disturbance.  Respiratory: Negative for cough, chest tightness, wheezing and stridor.   Cardiovascular: Negative for chest pain, palpitations and leg swelling.  Gastrointestinal: Negative for nausea, vomiting, abdominal pain, diarrhea, constipation, blood in stool and abdominal distention.  Genitourinary: Negative for urgency, hematuria, flank pain, discharge, difficulty urinating and genital sores.    Musculoskeletal: Negative for myalgias, back pain, joint swelling, arthralgias, gait problem and neck stiffness.  Skin: Negative for rash.  Neurological: Negative for dizziness, syncope, speech difficulty, weakness, numbness and headaches.  Hematological: Negative for adenopathy. Does not bruise/bleed easily.  Psychiatric/Behavioral: Negative for behavioral problems and dysphoric mood. The patient is not nervous/anxious.        Objective:   Physical Exam  Constitutional: He appears well-developed and well-nourished. No distress.  HENT:  Head: Normocephalic and atraumatic.  Left Ear: External ear normal.  Mouth/Throat: Oropharynx is clear and moist.  Eyes: EOM are normal. Pupils are equal, round, and reactive to light. Right eye exhibits no discharge.  Conjunctival injection left eye No exudate noted  Cardiovascular: Normal rate and regular rhythm.   Pulmonary/Chest: Effort normal and breath sounds normal.  Lymphadenopathy:    He has no cervical adenopathy.          Assessment & Plan:   Conjunctivitis left eye.  Will treat with antibiotic eyedrops and follow closely clinically

## 2015-07-28 NOTE — Progress Notes (Signed)
Pre visit review using our clinic review tool, if applicable. No additional management support is needed unless otherwise documented below in the visit note. 

## 2015-07-28 NOTE — Patient Instructions (Signed)

## 2015-07-31 ENCOUNTER — Ambulatory Visit (INDEPENDENT_AMBULATORY_CARE_PROVIDER_SITE_OTHER): Payer: BLUE CROSS/BLUE SHIELD | Admitting: Physician Assistant

## 2015-07-31 ENCOUNTER — Telehealth: Payer: Self-pay | Admitting: Internal Medicine

## 2015-07-31 VITALS — BP 130/80 | HR 87 | Temp 98.6°F | Resp 16 | Ht 68.5 in | Wt 183.0 lb

## 2015-07-31 DIAGNOSIS — H18892 Other specified disorders of cornea, left eye: Secondary | ICD-10-CM

## 2015-07-31 MED ORDER — CIPROFLOXACIN HCL 0.3 % OP SOLN: 2.0000 [drp] | OPHTHALMIC | Status: DC

## 2015-07-31 NOTE — Progress Notes (Signed)
07/31/2015 at 7:13 PM  [name] / DOB: [birth year] / MRN: [id number]  The patient has ONYCHOMYCOSIS, TOENAILS; IMPAIRED GLUCOSE TOLERANCE; Essential hypertension; Allergic rhinitis; RHEUMATOID ARTHRITIS, CHRONIC; INSOMNIA; NEPHROLITHIASIS, HX OF; and TOBACCO USE, QUIT on his problem list.  SUBJECTIVE  [name] is a 69 y.o. well appearing male presenting for the chief complaint of left eye irritation that started last 5 days ago.  He first noticed the pain upon wakening and also had a foreign body sensation.  Reports he washed the eye out with tap water several times and was also rubbing the eye and got no relief.  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  He wears corrective lenses and never contacts.  He denies changes in vision.  He admits to a mild photophobia along with clear eye discharge.  The right eye has remained unaffected.       He  has a past medical history of Rheumatoid arthritis(714.0) (1998); Nephrolithiasis (1968); Insomnia; Allergic rhinitis; Impaired glucose tolerance; and Hypertension.    Medications reviewed and updated by myself where necessary, and exist elsewhere in the encounter.   Mr. Tiedt is allergic to contrast media; ibuprofen; penicillins; and sulfamethoxazole. He  reports that he has quit smoking. He has never used smokeless tobacco. He reports that he does not drink alcohol or use illicit drugs. He  has no sexual activity history on file. The patient  has past surgical history that includes status post multiple lithotripy and urological surgery for reccurent calcium oxalate stones.  His family history includes Cancer in his mother; Diabetes in his father.  Review of Systems  Constitutional: Negative for fever.  Eyes:  Positive for redness. Negative for blurred vision and double vision.  Respiratory: Negative for cough.   Cardiovascular: Negative for chest pain.  Gastrointestinal: Negative for nausea and vomiting.  Genitourinary: Negative for dysuria.  Skin: Negative for rash.  Neurological: Negative for headaches.    OBJECTIVE  His  height is 5' 8.5" (1.74 m) and weight is 183 lb (83.008 kg). His oral temperature is 98.6 F (37 C). His blood pressure is 130/80 and his pulse is 87. His respiration is 16 and oxygen saturation is 98%.  The patient's body mass index is 27.42 kg/(m^2).   Visual Acuity Screening   Right eye Left eye Both eyes  Without correction:     With correction: 20/15-2 20/15-2 20/13    Physical Exam  Vitals reviewed. Eyes: Right eye exhibits no discharge and no exudate. Left eye exhibits discharge. Left eye exhibits no exudate. Right conjunctiva is not injected. Left conjunctiva is injected. Right eye exhibits normal extraocular motion and no nystagmus. Left eye exhibits normal extraocular motion and no nystagmus. Right pupil is round and reactive. Left pupil is round and reactive. Pupils are equal.  Slit lamp exam:      The left eye shows corneal abrasion.    Cardiovascular: Normal rate.   Respiratory: Effort normal.    No results found for this or any previous visit (from the past 24 hour(s)).  ASSESSMENT & PLAN  Amram was seen today for eye pain.  Diagnoses and all orders for this visit:  Corneal irritation of left eye: I am concerned about the possibility of a corneal ulcer with  this patient. Please call patient at [phone] for this urgent referral to Dr. Zenia Resides practice to be seen tomorrow.    -     Ambulatory referral to Ophthalmology -     Given patient's report of eye irritation with polytrim will change him to Cipro for the time being.    Other orders -     ciprofloxacin (CILOXAN) 0.3 % ophthalmic solution; Place 2 drops into both eyes every 4 (four) hours  while awake. Administer 1 drop, every 2 hours, while awake, for 2 days. Then 1 drop, every 4 hours, while awake, for the next 5 days.    The patient was advised to call or come back to clinic if he does not see an improvement in symptoms, or worsens with the above plan.   Philis Fendt, MHS, PA-C [facility] 07/31/2015 7:13 PM

## 2015-07-31 NOTE — Telephone Encounter (Signed)
[facility] - Client [place] [facility]  Patient Name: [name]  DOB: 1946/07/15    Initial Comment Caller states he was in office Mon, was dx with Hay[place]. Has questions about medication provided. Says the drops burns for more than an hour.    Nurse Assessment  Nurse: Solocinski, RN, Beth Date/Time (Eastern Time): 07/31/2015 5:05:11 PM  Confirm and document reason for call. If symptomatic, describe symptoms. ---Caller states seen on Monday and dx with pink and given neomycin-poly with hydrocortisone eye drops, place 2 drops in left eye every 4 hours.. Complains eye drop burns a lot for about an hour. Unsure if he should continue take this medication. Symptoms are clearing and getting better. Also gives caller a headache and makes sinus feel stuffy.  Has the patient traveled out of the country within the last 30 days? ---Not Applicable  Does the patient require triage? ---No     Guidelines    Guideline Title Affirmed Question Affirmed Notes       Final Disposition User   Clinical Call Solocinski, RN, Beth    Comments  Relayed information from On call, see notes. Caller verbalizes understanding and will comply with instructions.   Referrals  [facility]

## 2015-08-01 ENCOUNTER — Telehealth: Payer: Self-pay | Admitting: Internal Medicine

## 2015-08-01 NOTE — Telephone Encounter (Signed)
FYI.  On call nurse called and informed pt had called reporting persistent burning - eyes.  Worse after using the eye drops prescribed earlier in the week.  Was evaluated Monday of this week for eye issues.  Medication given - burning.  Instructed to stop current drops and evaluation this pm.  Reviewed chart.  Nurse informed pt and pt evaluated at [facility].  See note from [facility].

## 2015-09-13 DIAGNOSIS — S329XXA Fracture of unspecified parts of lumbosacral spine and pelvis, initial encounter for closed fracture: Secondary | ICD-10-CM

## 2015-09-13 HISTORY — DX: Fracture of unspecified parts of lumbosacral spine and pelvis, initial encounter for closed fracture: S32.9XXA

## 2015-09-24 ENCOUNTER — Other Ambulatory Visit: Payer: Self-pay

## 2015-09-30 ENCOUNTER — Telehealth: Payer: Self-pay | Admitting: Internal Medicine

## 2015-09-30 NOTE — Telephone Encounter (Signed)
I used 11am sda slot for 10/03/15 pt did not want to see no one but Dr Raliegh Ip

## 2015-09-30 NOTE — Telephone Encounter (Signed)
Noted  

## 2015-10-03 ENCOUNTER — Encounter: Payer: Self-pay | Admitting: Internal Medicine

## 2015-10-03 ENCOUNTER — Ambulatory Visit (INDEPENDENT_AMBULATORY_CARE_PROVIDER_SITE_OTHER): Payer: BLUE CROSS/BLUE SHIELD | Admitting: Internal Medicine

## 2015-10-03 VITALS — BP 132/80 | HR 90 | Temp 98.6°F | Resp 20 | Ht 68.5 in | Wt 187.0 lb

## 2015-10-03 DIAGNOSIS — I10 Essential (primary) hypertension: Secondary | ICD-10-CM | POA: Diagnosis not present

## 2015-10-03 DIAGNOSIS — R079 Chest pain, unspecified: Secondary | ICD-10-CM | POA: Diagnosis not present

## 2015-10-03 DIAGNOSIS — Z23 Encounter for immunization: Secondary | ICD-10-CM

## 2015-10-03 MED ORDER — TRAZODONE HCL 100 MG PO TABS: 100.0000 mg | ORAL_TABLET | Freq: Every day | ORAL | Status: DC

## 2015-10-03 MED ORDER — HYDROCHLOROTHIAZIDE 25 MG PO TABS
25.0000 mg | ORAL_TABLET | Freq: Every day | ORAL | Status: DC
Start: 2015-10-03 — End: 2015-10-21

## 2015-10-03 MED ORDER — FLUTICASONE PROPIONATE 50 MCG/ACT NA SUSP
NASAL | Status: DC
Start: 2015-10-03 — End: 2015-12-09

## 2015-10-03 NOTE — Progress Notes (Signed)
Subjective:    Patient ID: [name], male    DOB: 09/06/1946, 69 y.o.   MRN: [id number]  HPI 69 year old patient who has a history of RA as well as essential hypertension. He had a number of concerns today.  He has a long history of a trigger finger involving his right middle digit.  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 He has no predictable exertional chest tightness.  The identical pain is also aggravated by coughing  Past Medical History  Diagnosis Date  . Rheumatoid arthritis(714.0) 1998  . Nephrolithiasis 1968  . Insomnia   . Allergic rhinitis   . Impaired glucose tolerance   . Hypertension     Social History   Social History  . Marital Status: Married    Spouse Name: N/A  . Number of Children: N/A  . Years of Education: N/A   Occupational History  . Not on file.   Social History Main Topics  . 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 Smokeless tobacco: Never Used     Comment: quit 30 yrs ago  . Alcohol Use: No  . Drug Use: No  . Sexual Activity: Not on file   Other Topics Concern  . Not on file   Social History Narrative    Past Surgical History  Procedure Laterality Date  . Status post multiple lithotripy and urological surgery for reccurent calcium oxalate stones      Family History  Problem Relation Age of Onset  . Cancer Mother     pancreatic ; pulmonary embolism   . Diabetes Father     Allergies  Allergen Reactions  . Contrast Media [Iodinated Diagnostic Agents] Hives    03/28/14 pt received 1 hour emergent prep and pt did good and had no complaints after scan.  . Ibuprofen     REACTION: unspecified  . Penicillins     REACTION: unspecified  . Sulfamethoxazole     REACTION: unspecified    Current Outpatient Prescriptions on File Prior to Visit  Medication  Sig Dispense Refill  . Cetirizine HCl 10 MG CAPS Take 10 mg by mouth daily.    . Cholecalciferol (VITAMIN D-3) 1000 UNITS CAPS Take 1,000 Units by mouth daily.    . Coenzyme Q10 (CO Q 10) 10 MG CAPS Take 10 mg by mouth daily.    . fexofenadine (ALLEGRA) 30 MG tablet Take 30 mg by mouth daily.    . folic acid (FOLVITE) 1 MG tablet Take 1 mg by mouth daily.      Marland Kitchen HYDROcodone-acetaminophen (NORCO/VICODIN) 5-325 MG per tablet Take 1-2 tablets by mouth every 6 (six) hours as needed for moderate pain. 20 tablet 0  . meloxicam (MOBIC) 7.5 MG tablet Take 1 tablet (7.5 mg total) by mouth 2 (two) times daily. 90 tablet 3  . methotrexate (RHEUMATREX) 2.5 MG tablet Take 2.5 mg by mouth once a week. 8 tablets every week Caution:Chemotherapy. Protect from light.    Marland Kitchen oxymetazoline (AFRIN) 0.05 % nasal spray Place 1 spray into both nostrils 2 (two) times daily.    . Probiotic Product (PRO-BIOTIC BLEND PO) Take 1 tablet by mouth daily.    . saw palmetto 160 MG capsule Take 160 mg by mouth 2 (two) times daily.    . tamsulosin (FLOMAX) 0.4 MG CAPS capsule Take 1 capsule (  0.4 mg total) by mouth daily. 90 capsule 2  . traMADol (ULTRAM) 50 MG tablet Take 50 mg by mouth every 6 (six) hours as needed for moderate pain.     No current facility-administered medications on file prior to visit.    BP 132/80 mmHg  Pulse 90  Temp(Src) 98.6 F (37 C) (Oral)  Resp 20  Ht 5' 8.5" (1.74 m)  Wt 187 lb (84.823 kg)  BMI 28.02 kg/m2  SpO2 97%      Review of Systems  Constitutional: Negative for fever, chills, appetite change and fatigue.  HENT: Negative for congestion, dental problem, ear pain, hearing loss, sore throat, tinnitus, trouble swallowing and voice change.   Eyes: Negative for pain, discharge and visual disturbance.  Respiratory: Negative for cough, chest tightness, wheezing and stridor.   Cardiovascular: Positive for chest pain. Negative for palpitations and leg swelling.  Gastrointestinal: Negative  for nausea, vomiting, abdominal pain, diarrhea, constipation, blood in stool and abdominal distention.  Genitourinary: Negative for urgency, hematuria, flank pain, discharge, difficulty urinating and genital sores.  Musculoskeletal: Negative for myalgias, back pain, joint swelling, arthralgias, gait problem and neck stiffness.  Skin: Negative for rash.  Neurological: Negative for dizziness, syncope, speech difficulty, weakness, numbness and headaches.  Hematological: Negative for adenopathy. Does not bruise/bleed easily.  Psychiatric/Behavioral: Negative for behavioral problems and dysphoric mood. The patient is not nervous/anxious.        Objective:   Physical Exam  Constitutional: He is oriented to person, place, and time. He appears well-developed.  HENT:  Head: Normocephalic.  Right Ear: External ear normal.  Left Ear: External ear normal.  Eyes: Conjunctivae and EOM are normal.  Neck: Normal range of motion.  Cardiovascular: Normal rate and normal heart sounds.   Pulmonary/Chest: Breath sounds normal. He exhibits tenderness.  Very minimal discomfort involving the right upper anterior chest wall as well as the left lower anterior chest wall  Abdominal: Bowel sounds are normal.  Musculoskeletal: Normal range of motion. He exhibits no edema or tenderness.  Neurological: He is alert and oriented to person, place, and time.  Psychiatric: He has a normal mood and affect. His behavior is normal.          Assessment & Plan:   Chest wall pain RA Trigger finger Essential hypertension, stable  We'll observe at this point.  He will will report any new or worsening symptoms.  Otherwise, will return for his annual exam

## 2015-10-03 NOTE — Progress Notes (Signed)
Pre visit review using our clinic review tool, if applicable. No additional management support is needed unless otherwise documented below in the visit note. 

## 2015-10-03 NOTE — Addendum Note (Signed)
Addended by: Marian Sorrow on: 10/03/2015 12:11 PM   Modules accepted: Orders

## 2015-10-03 NOTE — Patient Instructions (Signed)
Call or return to clinic prn if these symptoms worsen or fail to improve as anticipated.

## 2015-10-14 DIAGNOSIS — Z8619 Personal history of other infectious and parasitic diseases: Secondary | ICD-10-CM

## 2015-10-14 HISTORY — DX: Personal history of other infectious and parasitic diseases: Z86.19

## 2015-10-21 ENCOUNTER — Other Ambulatory Visit: Payer: Self-pay | Admitting: Internal Medicine

## 2015-10-29 ENCOUNTER — Telehealth: Payer: Self-pay | Admitting: Internal Medicine

## 2015-10-29 MED ORDER — TAMSULOSIN HCL 0.4 MG PO CAPS: 0.4000 mg | ORAL_CAPSULE | Freq: Every day | ORAL | Status: DC

## 2015-10-29 NOTE — Telephone Encounter (Signed)
Pt need new rx tamsulosin 0.4 mg #30 send to [facility] [address]

## 2015-10-29 NOTE — Telephone Encounter (Signed)
Spoke to pt's wife Neoma Laming, told her Rx was sent to pharmacy and pt needs to schedule physical per Dr.K. Neoma Laming verbalized understanding and will let pt know.

## 2015-11-11 ENCOUNTER — Inpatient Hospital Stay (HOSPITAL_COMMUNITY): Payer: BLUE CROSS/BLUE SHIELD

## 2015-11-11 ENCOUNTER — Encounter (HOSPITAL_COMMUNITY): Payer: Self-pay | Admitting: Cardiovascular Disease

## 2015-11-11 ENCOUNTER — Inpatient Hospital Stay (HOSPITAL_COMMUNITY)
Admission: EM | Admit: 2015-11-11 | Discharge: 2015-12-04 | DRG: 246 | Disposition: A | Discharge status: Rehabilitation Facility | Payer: BLUE CROSS/BLUE SHIELD | Attending: Cardiovascular Disease | Admitting: Cardiovascular Disease

## 2015-11-11 ENCOUNTER — Emergency Department (HOSPITAL_COMMUNITY): Payer: BLUE CROSS/BLUE SHIELD

## 2015-11-11 ENCOUNTER — Encounter (HOSPITAL_COMMUNITY)
Admission: EM | Disposition: A | Discharge status: Rehabilitation Facility | Payer: Self-pay | Source: Home / Self Care | Attending: Cardiovascular Disease

## 2015-11-11 DIAGNOSIS — R5381 Other malaise: Secondary | ICD-10-CM

## 2015-11-11 DIAGNOSIS — I2102 ST elevation (STEMI) myocardial infarction involving left anterior descending coronary artery: Principal | ICD-10-CM | POA: Diagnosis present

## 2015-11-11 DIAGNOSIS — Z87891 Personal history of nicotine dependence: Secondary | ICD-10-CM

## 2015-11-11 DIAGNOSIS — Z886 Allergy status to analgesic agent status: Secondary | ICD-10-CM

## 2015-11-11 DIAGNOSIS — E870 Hyperosmolality and hypernatremia: Secondary | ICD-10-CM | POA: Insufficient documentation

## 2015-11-11 DIAGNOSIS — E860 Dehydration: Secondary | ICD-10-CM | POA: Diagnosis present

## 2015-11-11 DIAGNOSIS — R0600 Dyspnea, unspecified: Secondary | ICD-10-CM

## 2015-11-11 DIAGNOSIS — R739 Hyperglycemia, unspecified: Secondary | ICD-10-CM | POA: Diagnosis present

## 2015-11-11 DIAGNOSIS — Z91041 Radiographic dye allergy status: Secondary | ICD-10-CM

## 2015-11-11 DIAGNOSIS — Z9861 Coronary angioplasty status: Secondary | ICD-10-CM

## 2015-11-11 DIAGNOSIS — D649 Anemia, unspecified: Secondary | ICD-10-CM | POA: Insufficient documentation

## 2015-11-11 DIAGNOSIS — R0789 Other chest pain: Secondary | ICD-10-CM | POA: Diagnosis not present

## 2015-11-11 DIAGNOSIS — R40244 Other coma, without documented Glasgow coma scale score, or with partial score reported, unspecified time: Secondary | ICD-10-CM | POA: Diagnosis not present

## 2015-11-11 DIAGNOSIS — R4020 Unspecified coma: Secondary | ICD-10-CM | POA: Diagnosis present

## 2015-11-11 DIAGNOSIS — I5021 Acute systolic (congestive) heart failure: Secondary | ICD-10-CM | POA: Diagnosis not present

## 2015-11-11 DIAGNOSIS — I255 Ischemic cardiomyopathy: Secondary | ICD-10-CM | POA: Diagnosis present

## 2015-11-11 DIAGNOSIS — J309 Allergic rhinitis, unspecified: Secondary | ICD-10-CM | POA: Diagnosis present

## 2015-11-11 DIAGNOSIS — Z880 Allergy status to penicillin: Secondary | ICD-10-CM

## 2015-11-11 DIAGNOSIS — K7201 Acute and subacute hepatic failure with coma: Secondary | ICD-10-CM | POA: Diagnosis not present

## 2015-11-11 DIAGNOSIS — E46 Unspecified protein-calorie malnutrition: Secondary | ICD-10-CM | POA: Diagnosis not present

## 2015-11-11 DIAGNOSIS — Z452 Encounter for adjustment and management of vascular access device: Secondary | ICD-10-CM

## 2015-11-11 DIAGNOSIS — R410 Disorientation, unspecified: Secondary | ICD-10-CM | POA: Insufficient documentation

## 2015-11-11 DIAGNOSIS — I213 ST elevation (STEMI) myocardial infarction of unspecified site: Secondary | ICD-10-CM | POA: Diagnosis not present

## 2015-11-11 DIAGNOSIS — R7302 Impaired glucose tolerance (oral): Secondary | ICD-10-CM | POA: Diagnosis present

## 2015-11-11 DIAGNOSIS — I10 Essential (primary) hypertension: Secondary | ICD-10-CM | POA: Diagnosis present

## 2015-11-11 DIAGNOSIS — J9600 Acute respiratory failure, unspecified whether with hypoxia or hypercapnia: Secondary | ICD-10-CM

## 2015-11-11 DIAGNOSIS — R579 Shock, unspecified: Secondary | ICD-10-CM | POA: Diagnosis not present

## 2015-11-11 DIAGNOSIS — J384 Edema of larynx: Secondary | ICD-10-CM | POA: Diagnosis not present

## 2015-11-11 DIAGNOSIS — G931 Anoxic brain damage, not elsewhere classified: Secondary | ICD-10-CM | POA: Diagnosis present

## 2015-11-11 DIAGNOSIS — E876 Hypokalemia: Secondary | ICD-10-CM | POA: Diagnosis present

## 2015-11-11 DIAGNOSIS — Z8674 Personal history of sudden cardiac arrest: Secondary | ICD-10-CM | POA: Diagnosis not present

## 2015-11-11 DIAGNOSIS — I2109 ST elevation (STEMI) myocardial infarction involving other coronary artery of anterior wall: Secondary | ICD-10-CM

## 2015-11-11 DIAGNOSIS — I2510 Atherosclerotic heart disease of native coronary artery without angina pectoris: Secondary | ICD-10-CM

## 2015-11-11 DIAGNOSIS — Z4659 Encounter for fitting and adjustment of other gastrointestinal appliance and device: Secondary | ICD-10-CM

## 2015-11-11 DIAGNOSIS — I5023 Acute on chronic systolic (congestive) heart failure: Secondary | ICD-10-CM | POA: Diagnosis not present

## 2015-11-11 DIAGNOSIS — R079 Chest pain, unspecified: Secondary | ICD-10-CM | POA: Diagnosis not present

## 2015-11-11 DIAGNOSIS — J189 Pneumonia, unspecified organism: Secondary | ICD-10-CM | POA: Diagnosis not present

## 2015-11-11 DIAGNOSIS — Z888 Allergy status to other drugs, medicaments and biological substances status: Secondary | ICD-10-CM

## 2015-11-11 DIAGNOSIS — I469 Cardiac arrest, cause unspecified: Secondary | ICD-10-CM | POA: Diagnosis not present

## 2015-11-11 DIAGNOSIS — Z978 Presence of other specified devices: Secondary | ICD-10-CM

## 2015-11-11 DIAGNOSIS — M05741 Rheumatoid arthritis with rheumatoid factor of right hand without organ or systems involvement: Secondary | ICD-10-CM | POA: Diagnosis not present

## 2015-11-11 DIAGNOSIS — D75839 Thrombocytosis, unspecified: Secondary | ICD-10-CM | POA: Insufficient documentation

## 2015-11-11 DIAGNOSIS — A09 Infectious gastroenteritis and colitis, unspecified: Secondary | ICD-10-CM | POA: Diagnosis not present

## 2015-11-11 DIAGNOSIS — M05742 Rheumatoid arthritis with rheumatoid factor of left hand without organ or systems involvement: Secondary | ICD-10-CM | POA: Diagnosis not present

## 2015-11-11 DIAGNOSIS — G4700 Insomnia, unspecified: Secondary | ICD-10-CM | POA: Diagnosis present

## 2015-11-11 DIAGNOSIS — I252 Old myocardial infarction: Secondary | ICD-10-CM | POA: Diagnosis not present

## 2015-11-11 DIAGNOSIS — R451 Restlessness and agitation: Secondary | ICD-10-CM | POA: Diagnosis not present

## 2015-11-11 DIAGNOSIS — Y95 Nosocomial condition: Secondary | ICD-10-CM | POA: Diagnosis present

## 2015-11-11 DIAGNOSIS — R570 Cardiogenic shock: Secondary | ICD-10-CM | POA: Diagnosis present

## 2015-11-11 DIAGNOSIS — J9601 Acute respiratory failure with hypoxia: Secondary | ICD-10-CM | POA: Diagnosis not present

## 2015-11-11 DIAGNOSIS — D473 Essential (hemorrhagic) thrombocythemia: Secondary | ICD-10-CM | POA: Diagnosis not present

## 2015-11-11 DIAGNOSIS — R0602 Shortness of breath: Secondary | ICD-10-CM

## 2015-11-11 DIAGNOSIS — A047 Enterocolitis due to Clostridium difficile: Secondary | ICD-10-CM | POA: Diagnosis not present

## 2015-11-11 DIAGNOSIS — D72829 Elevated white blood cell count, unspecified: Secondary | ICD-10-CM | POA: Insufficient documentation

## 2015-11-11 DIAGNOSIS — Z87898 Personal history of other specified conditions: Secondary | ICD-10-CM | POA: Diagnosis not present

## 2015-11-11 DIAGNOSIS — R1310 Dysphagia, unspecified: Secondary | ICD-10-CM | POA: Diagnosis not present

## 2015-11-11 DIAGNOSIS — A0472 Enterocolitis due to Clostridium difficile, not specified as recurrent: Secondary | ICD-10-CM | POA: Insufficient documentation

## 2015-11-11 DIAGNOSIS — J811 Chronic pulmonary edema: Secondary | ICD-10-CM

## 2015-11-11 DIAGNOSIS — E873 Alkalosis: Secondary | ICD-10-CM | POA: Diagnosis not present

## 2015-11-11 DIAGNOSIS — R059 Cough, unspecified: Secondary | ICD-10-CM

## 2015-11-11 DIAGNOSIS — I4901 Ventricular fibrillation: Secondary | ICD-10-CM | POA: Diagnosis not present

## 2015-11-11 DIAGNOSIS — Z79899 Other long term (current) drug therapy: Secondary | ICD-10-CM

## 2015-11-11 DIAGNOSIS — Z931 Gastrostomy status: Secondary | ICD-10-CM

## 2015-11-11 DIAGNOSIS — R531 Weakness: Secondary | ICD-10-CM | POA: Diagnosis not present

## 2015-11-11 DIAGNOSIS — Z789 Other specified health status: Secondary | ICD-10-CM

## 2015-11-11 DIAGNOSIS — R0682 Tachypnea, not elsewhere classified: Secondary | ICD-10-CM | POA: Insufficient documentation

## 2015-11-11 DIAGNOSIS — M069 Rheumatoid arthritis, unspecified: Secondary | ICD-10-CM | POA: Diagnosis present

## 2015-11-11 DIAGNOSIS — L299 Pruritus, unspecified: Secondary | ICD-10-CM | POA: Diagnosis not present

## 2015-11-11 DIAGNOSIS — G9340 Encephalopathy, unspecified: Secondary | ICD-10-CM | POA: Insufficient documentation

## 2015-11-11 DIAGNOSIS — R05 Cough: Secondary | ICD-10-CM

## 2015-11-11 DIAGNOSIS — R058 Other specified cough: Secondary | ICD-10-CM

## 2015-11-11 DIAGNOSIS — J9621 Acute and chronic respiratory failure with hypoxia: Secondary | ICD-10-CM | POA: Diagnosis not present

## 2015-11-11 DIAGNOSIS — Z9289 Personal history of other medical treatment: Secondary | ICD-10-CM

## 2015-11-11 DIAGNOSIS — E878 Other disorders of electrolyte and fluid balance, not elsewhere classified: Secondary | ICD-10-CM | POA: Diagnosis not present

## 2015-11-11 HISTORY — DX: Fracture of unspecified parts of lumbosacral spine and pelvis, initial encounter for closed fracture: S32.9XXA

## 2015-11-11 HISTORY — PX: CARDIAC CATHETERIZATION: SHX172

## 2015-11-11 HISTORY — DX: Fracture of one rib, unspecified side, initial encounter for closed fracture: S22.39XA

## 2015-11-11 HISTORY — DX: ST elevation (STEMI) myocardial infarction involving left anterior descending coronary artery: I21.02

## 2015-11-11 LAB — POCT I-STAT, CHEM 8
BUN: 19 mg/dL (ref 6–20)
CREATININE: 0.9 mg/dL (ref 0.61–1.24)
Calcium, Ion: 1.16 mmol/L (ref 1.13–1.30)
Chloride: 101 mmol/L (ref 101–111)
GLUCOSE: 250 mg/dL — AB (ref 65–99)
HEMATOCRIT: 46 % (ref 39.0–52.0)
HEMOGLOBIN: 15.6 g/dL (ref 13.0–17.0)
Potassium: 2.7 mmol/L — CL (ref 3.5–5.1)
Sodium: 138 mmol/L (ref 135–145)
TCO2: 21 mmol/L (ref 0–100)

## 2015-11-11 LAB — COMPREHENSIVE METABOLIC PANEL
ALT: 225 U/L — ABNORMAL HIGH (ref 17–63)
AST: 267 U/L — ABNORMAL HIGH (ref 15–41)
Albumin: 3.6 g/dL (ref 3.5–5.0)
Alkaline Phosphatase: 72 U/L (ref 38–126)
Anion gap: 13 (ref 5–15)
BUN: 18 mg/dL (ref 6–20)
CHLORIDE: 102 mmol/L (ref 101–111)
CO2: 23 mmol/L (ref 22–32)
Calcium: 9 mg/dL (ref 8.9–10.3)
Creatinine, Ser: 1.53 mg/dL — ABNORMAL HIGH (ref 0.61–1.24)
GFR, EST AFRICAN AMERICAN: 52 mL/min — AB (ref >60)
GFR, EST NON AFRICAN AMERICAN: 45 mL/min — AB (ref >60)
GLUCOSE: 301 mg/dL — AB (ref 65–99)
POTASSIUM: 2.9 mmol/L — AB (ref 3.5–5.1)
SODIUM: 138 mmol/L (ref 135–145)
Total Bilirubin: 0.8 mg/dL (ref 0.3–1.2)
Total Protein: 5.8 g/dL — ABNORMAL LOW (ref 6.5–8.1)

## 2015-11-11 LAB — I-STAT TROPONIN, ED: Troponin i, poc: 3.87 ng/mL (ref 0.00–0.08)

## 2015-11-11 LAB — CBC WITH DIFFERENTIAL/PLATELET
Basophils Absolute: 0 10*3/uL (ref 0.0–0.1)
Basophils Relative: 0 %
EOS PCT: 1 %
Eosinophils Absolute: 0.1 10*3/uL (ref 0.0–0.7)
HCT: 45.3 % (ref 39.0–52.0)
Hemoglobin: 15.8 g/dL (ref 13.0–17.0)
LYMPHS ABS: 3.8 10*3/uL (ref 0.7–4.0)
LYMPHS PCT: 21 %
MCH: 31.7 pg (ref 26.0–34.0)
MCHC: 34.9 g/dL (ref 30.0–36.0)
MCV: 91 fL (ref 78.0–100.0)
MONOS PCT: 3 %
Monocytes Absolute: 0.6 10*3/uL (ref 0.1–1.0)
Neutro Abs: 13.8 10*3/uL — ABNORMAL HIGH (ref 1.7–7.7)
Neutrophils Relative %: 75 %
PLATELETS: 219 10*3/uL (ref 150–400)
RBC: 4.98 MIL/uL (ref 4.22–5.81)
RDW: 13.8 % (ref 11.5–15.5)
WBC: 18.3 10*3/uL — AB (ref 4.0–10.5)

## 2015-11-11 LAB — POCT I-STAT 3, ART BLOOD GAS (G3+)
ACID-BASE DEFICIT: 5 mmol/L — AB (ref 0.0–2.0)
Bicarbonate: 19.8 mEq/L — ABNORMAL LOW (ref 20.0–24.0)
O2 SAT: 97 %
PO2 ART: 93 mmHg (ref 80.0–100.0)
TCO2: 21 mmol/L (ref 0–100)
pCO2 arterial: 34.3 mmHg — ABNORMAL LOW (ref 35.0–45.0)
pH, Arterial: 7.369 (ref 7.350–7.450)

## 2015-11-11 LAB — POCT ACTIVATED CLOTTING TIME: Activated Clotting Time: 227 seconds

## 2015-11-11 SURGERY — LEFT HEART CATH AND CORS/GRAFTS ANGIOGRAPHY
Anesthesia: LOCAL

## 2015-11-11 MED ORDER — HEPARIN SODIUM (PORCINE) 5000 UNIT/ML IJ SOLN
INTRAMUSCULAR | Status: AC
  Administered 2015-11-11: 4000 [IU]
  Filled 2015-11-11: qty 1

## 2015-11-11 MED ORDER — SODIUM CHLORIDE 0.9 % IJ SOLN
3.0000 mL | INTRAMUSCULAR | Status: DC | PRN
  Administered 2015-11-22: 3 mL via INTRAVENOUS
  Filled 2015-11-11: qty 3

## 2015-11-11 MED ORDER — MIDAZOLAM HCL 2 MG/2ML IJ SOLN
INTRAMUSCULAR | Status: AC
  Administered 2015-11-12: 1 mg
  Filled 2015-11-11: qty 2

## 2015-11-11 MED ORDER — NITROGLYCERIN 1 MG/10 ML FOR IR/CATH LAB
INTRA_ARTERIAL | Status: DC | PRN
  Administered 2015-11-11: 150 ug via INTRACORONARY

## 2015-11-11 MED ORDER — TIROFIBAN HCL IV 12.5 MG/250 ML
INTRAVENOUS | Status: AC
  Filled 2015-11-11: qty 250

## 2015-11-11 MED ORDER — TIROFIBAN HCL IV 12.5 MG/250 ML
INTRAVENOUS | Status: DC | PRN
Start: 2015-11-11 — End: 2015-11-11
  Administered 2015-11-11: 0.075 ug/kg/min via INTRAVENOUS

## 2015-11-11 MED ORDER — MIDAZOLAM HCL 2 MG/2ML IJ SOLN
INTRAMUSCULAR | Status: DC | PRN
  Administered 2015-11-11: 2 mg via INTRAVENOUS

## 2015-11-11 MED ORDER — HEPARIN SODIUM (PORCINE) 1000 UNIT/ML IJ SOLN
INTRAMUSCULAR | Status: DC | PRN
  Administered 2015-11-11: 4000 [IU] via INTRAVENOUS
  Administered 2015-11-11: 2000 [IU] via INTRAVENOUS

## 2015-11-11 MED ORDER — SODIUM CHLORIDE 0.9 % IV SOLN: INTRAVENOUS | Status: DC | PRN

## 2015-11-11 MED ORDER — LIDOCAINE HCL (PF) 1 % IJ SOLN
INTRAMUSCULAR | Status: DC | PRN
  Administered 2015-11-11: 23:00:00

## 2015-11-11 MED ORDER — ETOMIDATE 2 MG/ML IV SOLN
20.0000 mg | Freq: Once | INTRAVENOUS | Status: AC
  Administered 2015-11-11: 20 mg via INTRAVENOUS

## 2015-11-11 MED ORDER — VERAPAMIL HCL 2.5 MG/ML IV SOLN
INTRAVENOUS | Status: DC | PRN
  Administered 2015-11-11: 22:00:00 via INTRA_ARTERIAL

## 2015-11-11 MED ORDER — SODIUM CHLORIDE 0.9 % IV SOLN: INTRAVENOUS | Status: AC

## 2015-11-11 MED ORDER — TIROFIBAN HCL IV 12.5 MG/250 ML: 0.1500 ug/kg/min | INTRAVENOUS | Status: AC

## 2015-11-11 MED ORDER — ASPIRIN 300 MG RE SUPP
300.0000 mg | Freq: Once | RECTAL | Status: AC
  Administered 2015-11-11: 300 mg via RECTAL
  Filled 2015-11-11: qty 1

## 2015-11-11 MED ORDER — IOHEXOL 350 MG/ML SOLN
INTRAVENOUS | Status: DC | PRN
  Administered 2015-11-11: 115 mL via INTRA_ARTERIAL

## 2015-11-11 MED ORDER — HEPARIN SODIUM (PORCINE) 1000 UNIT/ML IJ SOLN
INTRAMUSCULAR | Status: AC
  Filled 2015-11-11: qty 1

## 2015-11-11 MED ORDER — VERAPAMIL HCL 2.5 MG/ML IV SOLN
INTRAVENOUS | Status: AC
  Filled 2015-11-11: qty 2

## 2015-11-11 MED ORDER — TICAGRELOR 90 MG PO TABS
90.0000 mg | ORAL_TABLET | Freq: Two times a day (BID) | ORAL | Status: DC
  Administered 2015-11-12 – 2015-12-04 (×45): 90 mg via ORAL
  Filled 2015-11-11 (×45): qty 1

## 2015-11-11 MED ORDER — HEPARIN SODIUM (PORCINE) 5000 UNIT/ML IJ SOLN
5000.0000 [IU] | Freq: Three times a day (TID) | INTRAMUSCULAR | Status: DC
  Administered 2015-11-12 – 2015-11-24 (×37): 5000 [IU] via SUBCUTANEOUS
  Filled 2015-11-11 (×37): qty 1

## 2015-11-11 MED ORDER — TIROFIBAN (AGGRASTAT) BOLUS VIA INFUSION
INTRAVENOUS | Status: DC | PRN
Start: 2015-11-11 — End: 2015-11-11
  Administered 2015-11-11: 2125 ug via INTRAVENOUS

## 2015-11-11 MED ORDER — ROCURONIUM BROMIDE 50 MG/5ML IV SOLN
100.0000 mg | Freq: Once | INTRAVENOUS | Status: AC
  Administered 2015-11-11: 100 mg via INTRAVENOUS

## 2015-11-11 MED ORDER — ACETAMINOPHEN 325 MG PO TABS
650.0000 mg | ORAL_TABLET | ORAL | Status: DC | PRN
  Administered 2015-11-13 – 2015-12-03 (×12): 650 mg via ORAL
  Filled 2015-11-11 (×13): qty 2

## 2015-11-11 MED ORDER — MIDAZOLAM HCL 2 MG/2ML IJ SOLN
INTRAMUSCULAR | Status: AC
  Filled 2015-11-11: qty 2

## 2015-11-11 MED ORDER — HEPARIN SODIUM (PORCINE) 1000 UNIT/ML IJ SOLN
4000.0000 [IU] | Freq: Once | INTRAMUSCULAR | Status: DC
  Filled 2015-11-11: qty 4

## 2015-11-11 MED ORDER — SODIUM CHLORIDE 0.9 % IJ SOLN
3.0000 mL | Freq: Two times a day (BID) | INTRAMUSCULAR | Status: DC
  Administered 2015-11-12 – 2015-11-25 (×26): 3 mL via INTRAVENOUS

## 2015-11-11 MED ORDER — NITROGLYCERIN 1 MG/10 ML FOR IR/CATH LAB
INTRA_ARTERIAL | Status: AC
  Filled 2015-11-11: qty 10

## 2015-11-11 MED ORDER — ASPIRIN 81 MG PO CHEW
81.0000 mg | CHEWABLE_TABLET | Freq: Every day | ORAL | Status: DC
  Administered 2015-11-12 – 2015-12-04 (×23): 81 mg via ORAL
  Filled 2015-11-11 (×24): qty 1

## 2015-11-11 MED ORDER — ONDANSETRON HCL 4 MG/2ML IJ SOLN
4.0000 mg | Freq: Four times a day (QID) | INTRAMUSCULAR | Status: DC | PRN
  Administered 2015-11-30: 4 mg via INTRAVENOUS
  Filled 2015-11-11: qty 2

## 2015-11-11 MED ORDER — TICAGRELOR 90 MG PO TABS
ORAL_TABLET | ORAL | Status: AC
  Filled 2015-11-11: qty 2

## 2015-11-11 MED ORDER — TICAGRELOR 90 MG PO TABS
ORAL_TABLET | ORAL | Status: DC | PRN
  Administered 2015-11-11: 180 mg via NASOGASTRIC

## 2015-11-11 MED ORDER — LIDOCAINE HCL (PF) 1 % IJ SOLN
INTRAMUSCULAR | Status: AC
  Filled 2015-11-11: qty 30

## 2015-11-11 MED ORDER — HEPARIN (PORCINE) IN NACL 2-0.9 UNIT/ML-% IJ SOLN
INTRAMUSCULAR | Status: AC
  Filled 2015-11-11: qty 1000

## 2015-11-11 MED ORDER — METHYLPREDNISOLONE SODIUM SUCC 125 MG IJ SOLR
125.0000 mg | Freq: Once | INTRAMUSCULAR | Status: AC
  Administered 2015-11-11: 125 mg via INTRAVENOUS
  Filled 2015-11-11: qty 2

## 2015-11-11 MED ORDER — SODIUM CHLORIDE 0.9 % IV SOLN: 250.0000 mL | INTRAVENOUS | Status: DC | PRN

## 2015-11-11 SURGICAL SUPPLY — 19 items
BALLN EMERGE MR 2.5X12 (BALLOONS) ×2
BALLN ~~LOC~~ EUPHORA RX 3.75X27 (BALLOONS) ×2
BALLOON EMERGE MR 2.5X12 (BALLOONS) IMPLANT
BALLOON ~~LOC~~ EUPHORA RX 3.75X27 (BALLOONS) IMPLANT
CATH INFINITI 5FR ANG PIGTAIL (CATHETERS) ×2 IMPLANT
CATH INFINITI JR4 5F (CATHETERS) ×2 IMPLANT
CATH VISTA GUIDE 6FR XBLAD3.5 (CATHETERS) ×1 IMPLANT
DEVICE RAD COMP TR BAND LRG (VASCULAR PRODUCTS) ×2 IMPLANT
GLIDESHEATH SLEND SS 6F .021 (SHEATH) ×2 IMPLANT
HOVERMATT SINGLE USE (MISCELLANEOUS) ×1 IMPLANT
KIT ENCORE 26 ADVANTAGE (KITS) ×1 IMPLANT
KIT HEART LEFT (KITS) ×2 IMPLANT
PACK CARDIAC CATHETERIZATION (CUSTOM PROCEDURE TRAY) ×2 IMPLANT
STENT PROMUS PREM MR 3.5X38 (Permanent Stent) ×1 IMPLANT
SYR MEDRAD MARK V 150ML (SYRINGE) ×2 IMPLANT
TRANSDUCER W/STOPCOCK (MISCELLANEOUS) ×2 IMPLANT
TUBING CIL FLEX 10 FLL-RA (TUBING) ×2 IMPLANT
WIRE COUGAR XT STRL 190CM (WIRE) ×1 IMPLANT
WIRE SAFE-T 1.5MM-J .035X260CM (WIRE) ×2 IMPLANT

## 2015-11-11 NOTE — Consult Note (Signed)
PULMONARY / CRITICAL CARE MEDICINE   Name: [name] MRN: [id number] DOB: 08-29-1946    ADMISSION DATE:  11/11/2015 CONSULTATION DATE:  11/11/2015  REFERRING MD :  Dr. Burt Knack  CHIEF COMPLAINT:  Cardiac arrest  HISTORY OF PRESENT ILLNESS:  69 year old male with PMH as below, which is significant for RA and HTN. He presented to [facility] ED 11/29 after a witnessed cardiac arrest at home. He has had intermittent chest and arm pain for about 2 weeks status post mechanical fall. 11/29 chest pain acutely worsened and he unfortunately suffered a cardiac arrest, which was witnessed by his wife and EMS was called. Family initiated CPR. EMS arrived and AED was placed advising shock. 2 shocks were delivered and ROSC was achieved. 2 rounds epinephrine also administerred. EKG in the field was concerning for STEMI and code STEMI was called. In ED patient was unresponsive, he was intubated and taken emergently to cardiac cath lab where he remains at this time. Occlusive LAD lesion identified and intervened upon. PCCM consulted for ICU care. Total downtime estimated at less than 15 mins.   PAST MEDICAL HISTORY :  He  has a past medical history of Rheumatoid arthritis(714.0) (1998); Nephrolithiasis (1968); Insomnia; Allergic rhinitis; Impaired glucose tolerance; Hypertension; ST elevation (STEMI) myocardial infarction involving left anterior descending coronary artery ([place]) (11/11/2015); and Cardiac arrest with ventricular fibrillation ([place]) (11/11/2015).  PAST SURGICAL HISTORY: He  has past surgical history that includes status post multiple lithotripy and urological surgery for reccurent calcium oxalate stones.  Allergies  Allergen Reactions  . Contrast Media [Iodinated Diagnostic Agents] Hives    03/28/14 pt received 1 hour emergent prep and pt did good and had no complaints after scan.  . Ibuprofen     REACTION: unspecified  . Penicillins     REACTION: unspecified  . Sulfamethoxazole      REACTION: unspecified    No current facility-administered medications on file prior to encounter.   Current Outpatient Prescriptions on File Prior to Encounter  Medication Sig  . Cetirizine HCl 10 MG CAPS Take 10 mg by mouth daily.  . Cholecalciferol (VITAMIN D-3) 1000 UNITS CAPS Take 1,000 Units by mouth daily.  . Coenzyme Q10 (CO Q 10) 10 MG CAPS Take 10 mg by mouth daily.  . fexofenadine (ALLEGRA) 30 MG tablet Take 30 mg by mouth daily.  . fluticasone (FLONASE) 50 MCG/ACT nasal spray USE 2 SPRAYS INTO NOSE DAILY  . folic acid (FOLVITE) 1 MG tablet Take 1 mg by mouth daily.    . Golimumab ([place] ARIA IV) Inject into the vein. Infusion every 8 weeks  . hydrochlorothiazide (HYDRODIURIL) 25 MG tablet Take 1 tablet daily  . HYDROcodone-acetaminophen (NORCO/VICODIN) 5-325 MG per tablet Take 1-2 tablets by mouth every 6 (six) hours as needed for moderate pain.  . meloxicam (MOBIC) 7.5 MG tablet Take 1 tablet (7.5 mg total) by mouth 2 (two) times daily.  . methotrexate (RHEUMATREX) 2.5 MG tablet Take 2.5 mg by mouth once a week. 8 tablets every week Caution:Chemotherapy. Protect from light.  Marland Kitchen oxymetazoline (AFRIN) 0.05 % nasal spray Place 1 spray into both nostrils 2 (two) times daily.  . Probiotic Product (PRO-BIOTIC BLEND PO) Take 1 tablet by mouth daily.  . saw palmetto 160 MG capsule Take 160 mg by mouth 2 (two) times daily.  . tamsulosin (FLOMAX) 0.4 MG CAPS capsule Take 1 capsule (0.4 mg total) by mouth daily.  . traMADol (ULTRAM) 50 MG tablet Take 50 mg by mouth every  6 (six) hours as needed for moderate pain.  . traZODone (DESYREL) 100 MG tablet Take 1 tablet (100 mg total) by mouth at bedtime.    FAMILY HISTORY:  His indicated that his mother is deceased. He indicated that his father is deceased.   SOCIAL HISTORY: He  reports that he has quit smoking. He has never used smokeless tobacco. He reports that he does not drink alcohol or use illicit drugs.  REVIEW OF SYSTEMS:    Unable due to unresponsive   SUBJECTIVE:   VITAL SIGNS: BP 122/89 mmHg  Pulse 0  Temp(Src) 97.7 F (36.5 C) (Rectal)  Resp 36  Ht 5\' 7"  (1.702 m)  Wt 85 kg (187 lb 6.3 oz)  BMI 29.34 kg/m2  SpO2 0%  HEMODYNAMICS:    VENTILATOR SETTINGS: Vent Mode:  [-] PRVC FiO2 (%):  [100 %] 100 % Set Rate:  [20 bmp] 20 bmp Vt Set:  [550 mL] 550 mL Plateau Pressure:  [16 cmH20] 16 cmH20  INTAKE / OUTPUT:    PHYSICAL EXAMINATION: General:  Male of normal body habitus in NAD Neuro:  Agitated on vent HEENT:  [place]/AT, no JVD, PERRL Cardiovascular:  RRR, no MRG Lungs:  Clear bilateral breath sounds Abdomen:  Soft, non-tender, non-distended Musculoskeletal:  No acute deformity Skin:  Grossly intact  LABS:  CBC  Recent Labs Lab 11/11/15 2137 11/11/15 2251  WBC 18.3*  --   HGB 15.8 15.6  HCT 45.3 46.0  PLT 219  --    Coag's No results for input(s): APTT, INR in the last 168 hours. BMET  Recent Labs Lab 11/11/15 2137 11/11/15 2251  NA 138 138  K 2.9* 2.7*  CL 102 101  CO2 23  --   BUN 18 19  CREATININE 1.53* 0.90  GLUCOSE 301* 250*   Electrolytes  Recent Labs Lab 11/11/15 2137  CALCIUM 9.0   Sepsis Markers No results for input(s): LATICACIDVEN, PROCALCITON, O2SATVEN in the last 168 hours. ABG  Recent Labs Lab 11/11/15 2252  PHART 7.369  PCO2ART 34.3*  PO2ART 93.0   Liver Enzymes  Recent Labs Lab 11/11/15 2137  AST 267*  ALT 225*  ALKPHOS 72  BILITOT 0.8  ALBUMIN 3.6   Cardiac Enzymes No results for input(s): TROPONINI, PROBNP in the last 168 hours. Glucose No results for input(s): GLUCAP in the last 168 hours.  Imaging Ct Head Wo Contrast  11/11/2015  CLINICAL DATA:  69 year old male with cardiac arrest. EXAM: CT HEAD WITHOUT CONTRAST TECHNIQUE: Contiguous axial images were obtained from the base of the skull through the vertex without intravenous contrast. COMPARISON:  Head CT dated 03/29/2015 FINDINGS: There is slight prominence of the  ventricles and sulci compatible with age-related volume loss. Mild periventricular and deep white matter hypodensities represent chronic microvascular ischemic changes. There is no intracranial hemorrhage. No mass effect or midline shift identified. The visualized paranasal sinuses and mastoid air cells are well aerated. The calvarium is intact. IMPRESSION: No acute intracranial pathology. Mild age-related atrophy and chronic microvascular ischemic disease. If symptoms persist and there are no contraindications, MRI may provide better evaluation if clinically indicated Electronically Signed   By: Anner Crete M.D.   On: 11/11/2015 23:44   Dg Chest Portable 1 View  11/11/2015  CLINICAL DATA:  Myocardial infarction.  Post resuscitation. EXAM: PORTABLE CHEST 1 VIEW COMPARISON:  03/29/2015 FINDINGS: Endotracheal tube tip is 2.6 cm above the carina. The nasogastric tube extends into the stomach. The lungs are clear except for mild atelectatic appearing  linear basilar opacities on the left. No confluent airspace consolidation. No large effusion. No pneumothorax. Mild central vascular prominence, which can be physiologic in the supine position. IMPRESSION: Support equipment appears satisfactorily positioned. Electronically Signed   By: Andreas Newport M.D.   On: 11/11/2015 22:12     STUDIES:  LHC 11/29 >>>   CULTURES: n/a  ANTIBIOTICS: n/a  SIGNIFICANT EVENTS: 11/29 cardiac arrest > cath lab, out to ICU, therapeutic hypothermia.   LINES/TUBES: ETT 11/29 >>> CVL 11/29 >>>  DISCUSSION: 69 year old male witnessed cardiac arrest at home. CPR started by family. EMS arrival 2 defibrillation and 2 rounds of Epi with ROSC. STEMI on EKG to cath lab with PCI to LAD lesion. To ICU post procedure. Etiology of arrest assumed to be STEMI, K 2.7 could also play role. Short downtime of VF/VT arrest. Will initiate therapeutic hypothermia protocol and monitor in ICU with guidance of cardiology team. Replace K.  Will place CVL, pressors if needed.   ASSESSMENT / PLAN:  PULMONARY A: Acute respiratory failure secondary to cardiac arrest  P:   Full vent support Follow ABG VAP bundle  CARDIOVASCULAR A:  Cardiac arrest - VT/VF STEMI s/p PCI 11/29 HTN  P:  Telemetry monitoring MAP goal > 9mmHg Initiate therapeutic hypothermia protocol Place CVL CVP monitoring Trend troponin Cardiology primary  RENAL A:   CKD Hypokalemia  P:   Gentle IVF hydration Follow Bmet Correct electrolytes as indicated 6 runs K now Strict I&O  GASTROINTESTINAL A:   Transaminitis  P:   NPO Pepcid for SUP Trend LFT  HEMATOLOGIC A:   No acute issues  P:  Follow CBC Antiplatelet/anticoagulation per cardiology SCDs for VTE prophylaxis Check Coags  INFECTIOUS A:   Leukocytosis  P:   Follow WBC and fever curve  ENDOCRINE A:   Hyperglycemia without history of DM     P:   CBG monitoring and SSI   NEUROLOGIC A:   Acute anoxic encephalopathy  P:   RASS goal: -4/-5 prior to NMB administration Fentanyl gtt for analgesia Versed gtt for deep sedation Nimbex to avoid shivering during therapeutic hypothermia protocol   FAMILY  - Updates:   - Inter-disciplinary family meet or Palliative Care meeting due by:  12/7    Georgann Housekeeper, AGACNP-BC [place] [facility]/Critical Care Pager [phone] or [phone]  11/12/2015 12:13 AM

## 2015-11-11 NOTE — H&P (Signed)
History and Physical  Patient ID: [name] MRN: [id number], SOB: [birth year] 69 y.o. Date of Encounter: 11/11/2015, 9:56 PM  Primary Physician: [name], MD Primary Cardiologist: none  Chief Complaint: Chest pain/Cardiac Arrest  HPI: 69 y.o. male w/ PMHx significant for rheumatoid arthritis and HTN who presented to [facility] on 11/11/2015 after an out-of-hospital cardiac arrest. The patient reportedly had chest and arm pain intermittently for 2 weeks after sustaining a mechanical fall. Today he had worsening of his chest pain and he ultimately arrested at home, witnessed by his wife. At the time of my evaluation, there is no family at bedside and history is obtained from EMS. First responder delivered 2 shocks via an AED and the patient had ROSC with this. An EKG in the field was suggestive of acute anterior STEMI and a Code STEMI is paged out.   Upon my evaluation in the ER, the patient is unresponsive. There is no further history obtainable other than through chart review. The patient is undergoing intubation and is being stabilized to go to the cardiac cath lab.   Past Medical History  Diagnosis Date  . Rheumatoid arthritis(714.0) 1998  . Nephrolithiasis 1968  . Insomnia   . Allergic rhinitis   . Impaired glucose tolerance   . Hypertension      Surgical History:  Past Surgical History  Procedure Laterality Date  . Status post multiple lithotripy and urological surgery for reccurent calcium oxalate stones       Home Meds: Prior to Admission medications   Medication Sig Start Date End Date Taking? Authorizing Provider  Cetirizine HCl 10 MG CAPS Take 10 mg by mouth daily.    Historical Provider, MD  Cholecalciferol (VITAMIN D-3) 1000 UNITS CAPS Take 1,000 Units by mouth daily.    Historical Provider, MD  Coenzyme Q10 (CO Q 10) 10 MG CAPS Take 10 mg by mouth daily.    Historical Provider, MD  fexofenadine (ALLEGRA) 30 MG tablet Take 30 mg  by mouth daily.    Historical Provider, MD  fluticasone (FLONASE) 50 MCG/ACT nasal spray USE 2 SPRAYS INTO NOSE DAILY 10/03/15   Marletta Lor, MD  folic acid (FOLVITE) 1 MG tablet Take 1 mg by mouth daily.      Historical Provider, MD  Golimumab ([place] ARIA IV) Inject into the vein. Infusion every 8 weeks    Historical Provider, MD  hydrochlorothiazide (HYDRODIURIL) 25 MG tablet Take 1 tablet daily 10/21/15   Marletta Lor, MD  HYDROcodone-acetaminophen (NORCO/VICODIN) 5-325 MG per tablet Take 1-2 tablets by mouth every 6 (six) hours as needed for moderate pain. 03/29/15   Fredia Sorrow, MD  meloxicam (MOBIC) 7.5 MG tablet Take 1 tablet (7.5 mg total) by mouth 2 (two) times daily. 07/28/15   Marletta Lor, MD  methotrexate (RHEUMATREX) 2.5 MG tablet Take 2.5 mg by mouth once a week. 8 tablets every week Caution:Chemotherapy. Protect from light.    Historical Provider, MD  oxymetazoline (AFRIN) 0.05 % nasal spray Place 1 spray into both nostrils 2 (two) times daily.    Historical Provider, MD  Probiotic Product (PRO-BIOTIC BLEND PO) Take 1 tablet by mouth daily.    Historical Provider, MD  saw palmetto 160 MG capsule Take 160 mg by mouth 2 (two) times daily.    Historical Provider, MD  tamsulosin (FLOMAX) 0.4 MG CAPS capsule Take 1 capsule (0.4 mg total) by mouth daily. 10/29/15   Marletta Lor, MD  traMADol Veatrice Bourbon) 50  MG tablet Take 50 mg by mouth every 6 (six) hours as needed for moderate pain.    Historical Provider, MD  traZODone (DESYREL) 100 MG tablet Take 1 tablet (100 mg total) by mouth at bedtime. 10/03/15   Marletta Lor, MD    Allergies:  Allergies  Allergen Reactions  . Contrast Media [Iodinated Diagnostic Agents] Hives    03/28/14 pt received 1 hour emergent prep and pt did good and had no complaints after scan.  . Ibuprofen     REACTION: unspecified  . Penicillins     REACTION: unspecified  . Sulfamethoxazole     REACTION: unspecified     Social History   Social History  . Marital Status: Married    Spouse Name: N/A  . Number of Children: N/A  . Years of Education: N/A   Occupational History  . Not on file.   Social History Main Topics  . 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 Smokeless tobacco: Never Used     Comment: quit 30 yrs ago  . Alcohol Use: No  . Drug Use: No  . Sexual Activity: Not on file   Other Topics Concern  . Not on file   Social History Narrative     Family History  Problem Relation Age of Onset  . Cancer Mother     pancreatic ; pulmonary embolism   . Diabetes Father     Review of Systems: Unable to obtain  Physical Exam: There were no vitals taken for this visit. General: intubated and unresponsiveness HEENT: Normocephalic, atraumatic, sclera anicteric Neck:  JVP normal Lungs: Clear bilaterally to auscultation without wheezes, rales, or rhonchi. Breathing is unlabored. Heart: RRR with normal S1 and S2. No murmurs, rubs, or gallops appreciated. Abdomen: Soft,  No obvious abdominal masses. Bowel sounds present Back: midline without obvious deformity Msk:  Strength and tone appear normal for age. Extremities: No clubbing, cyanosis, or edema.  Distal pedal pulses are 2+ and equal bilaterally. Neuro: unresponsive Psych:  uynable to assess Skin: warm and dry without rash   Labs:   Lab Results  Component Value Date   WBC 13.1* 03/29/2015   HGB 17.0 03/29/2015   HCT 50.0 03/29/2015   MCV 88.8 03/29/2015   PLT 164 03/29/2015   No results for input(s): NA, K, CL, CO2, BUN, CREATININE, CALCIUM, PROT, BILITOT, ALKPHOS, ALT, AST, GLUCOSE in the last 168 hours.  Invalid input(s): LABALBU No results for input(s): CKTOTAL, CKMB, TROPONINI in the last 72 hours. No results found for: CHOL, HDL, LDLCALC, TRIG No results found for: DDIMER  Radiology/Studies:  No results found.   EKG: NSR with acute anterior STEMI  CARDIAC STUDIES: pending  ASSESSMENT AND PLAN:  69 yo male with  no past cardiac history presenting with: 1. Out of hospital cardiac arrest, presumably VF 2. Anterior STEMI  Plan: emergency cath/PCI after stabilization in ER. Anticipate therapeutic hypothermia, post-M care in CCU.  [name] MD 11/11/2015, 9:56 PM

## 2015-11-11 NOTE — ED Notes (Signed)
White colored necklace and bracelet removed and placed in specimen cup

## 2015-11-11 NOTE — ED Notes (Signed)
Cardiology at bedside.

## 2015-11-11 NOTE — ED Notes (Signed)
Per EMS, pt was at home. [place] 2 mo ago, wife said he has complained of pain in chest and arm several times since, but thought was related to fall. Tonight, pt c/o of same pain and then collapsed. Family started CPR, fire department arrived and shocked twice. No rhythm when EMS arrived and administered 2 rounds of epi. Pt was pulseless at 2048, pulse returned at 2052. Rhythm returned as STEMI. BP went from 170/90 to 80/40, epi drip started for BP. Pt also received 5 versed and 100 fentanyl for shaking. King airway placed, 18g in the L AC and IO in R tibia. Cold saline started by EMS in IO.

## 2015-11-11 NOTE — Progress Notes (Addendum)
MEDICATION CONSULT NOTE - Initial Consult  Pharmacy Consult for Tirofiban Indication: post PCI  Allergies  Allergen Reactions  . Contrast Media [Iodinated Diagnostic Agents] Hives    03/28/14 pt received 1 hour emergent prep and pt did good and had no complaints after scan.  . Ibuprofen     REACTION: unspecified  . Penicillins     REACTION: unspecified  . Sulfamethoxazole     REACTION: unspecified    Patient Measurements: Weight: 187 lb 6.3 oz (85 kg)  Vital Signs: Temp: 97.7 F (36.5 C) (11/29 2156) Temp Source: Rectal (11/29 2156) BP: 122/89 mmHg (11/29 2305) Pulse Rate: 0 (11/29 2314)  Labs:  Recent Labs  11/11/15 2137 11/11/15 2251  HGB 15.8 15.6  HCT 45.3 46.0  PLT 219  --   CREATININE 1.53* 0.90    Estimated Creatinine Clearance: 84.2 mL/min (by C-G formula based on Cr of 0.9).   Medical History: Past Medical History  Diagnosis Date  . Rheumatoid arthritis(714.0) 1998  . Nephrolithiasis 1968  . Insomnia   . Allergic rhinitis   . Impaired glucose tolerance   . Hypertension   . ST elevation (STEMI) myocardial infarction involving left anterior descending coronary artery ([place]) 11/11/2015  . Cardiac arrest with ventricular fibrillation ([place]) 11/11/2015    Medications:  Awaiting home med rec  Assessment: 69 y.o. M presents with out of hospital cardiac arrest -CODE STEMI activated. Taken to the cath lab. Tirofiban bolus and gtt started in cath lab. Orders to continue tirofiban x 12 hours post PCI. SCr down to 0.9, est CrCl 84 ml/min. WBC wnl on admission.  Goal of Therapy:  Monitor platelets by anticoagulation protocol: Yes   Plan:  Increase tirofiban to 0.15 mcg/kg/min x 12 hours Will f/u a.m platelet count  Sherlon Handing, PharmD, BCPS Clinical pharmacist, pager [phone] 11/11/2015,11:31 PM

## 2015-11-11 NOTE — ED Provider Notes (Signed)
CSN: [id number]     Arrival date & time 11/11/15  2136 History   First MD Initiated Contact with Patient 11/11/15 2151     Chief Complaint  Patient presents with  . Code STEMI   Patient is a 69 y.o. male presenting with chest pain. The history is provided by the EMS personnel.  Chest Pain Pain location:  Unable to specify Pain severity:  Unable to specify Onset quality:  Sudden Associated symptoms: altered mental status   Risk factors: no coronary artery disease     Past Medical History  Diagnosis Date  . Rheumatoid arthritis(714.0) 1998  . Nephrolithiasis 1968  . Insomnia   . Allergic rhinitis   . Impaired glucose tolerance   . Hypertension   . ST elevation (STEMI) myocardial infarction involving left anterior descending coronary artery ([place]) 11/11/2015  . Cardiac arrest with ventricular fibrillation ([place]) 11/11/2015   Past Surgical History  Procedure Laterality Date  . Status post multiple lithotripy and urological surgery for reccurent calcium oxalate stones     Family History  Problem Relation Age of Onset  . Cancer Mother     pancreatic ; pulmonary embolism   . Diabetes Father    Social History  Substance Use Topics  . 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 Smokeless tobacco: Never Used     Comment: quit 30 yrs ago  . Alcohol Use: No    Review of Systems  Unable to perform ROS: Intubated      Allergies  Contrast media; Ibuprofen; Penicillins; and Sulfamethoxazole  Home Medications   Prior to Admission medications   Medication Sig Start Date End Date Taking? Authorizing Provider  Cetirizine HCl 10 MG CAPS Take 10 mg by mouth daily.    Historical Provider, MD  Cholecalciferol (VITAMIN D-3) 1000 UNITS CAPS Take 1,000 Units by mouth daily.    Historical Provider, MD  Coenzyme Q10 (CO Q 10) 10 MG CAPS Take 10 mg by mouth daily.    Historical Provider, MD  fexofenadine (ALLEGRA) 30 MG tablet Take 30 mg by mouth daily.    Historical Provider, MD  fluticasone  (FLONASE) 50 MCG/ACT nasal spray USE 2 SPRAYS INTO NOSE DAILY 10/03/15   Marletta Lor, MD  folic acid (FOLVITE) 1 MG tablet Take 1 mg by mouth daily.      Historical Provider, MD  Golimumab ([place] ARIA IV) Inject into the vein. Infusion every 8 weeks    Historical Provider, MD  hydrochlorothiazide (HYDRODIURIL) 25 MG tablet Take 1 tablet daily 10/21/15   Marletta Lor, MD  HYDROcodone-acetaminophen (NORCO/VICODIN) 5-325 MG per tablet Take 1-2 tablets by mouth every 6 (six) hours as needed for moderate pain. 03/29/15   Fredia Sorrow, MD  meloxicam (MOBIC) 7.5 MG tablet Take 1 tablet (7.5 mg total) by mouth 2 (two) times daily. 07/28/15   Marletta Lor, MD  methotrexate (RHEUMATREX) 2.5 MG tablet Take 2.5 mg by mouth once a week. 8 tablets every week Caution:Chemotherapy. Protect from light.    Historical Provider, MD  oxymetazoline (AFRIN) 0.05 % nasal spray Place 1 spray into both nostrils 2 (two) times daily.    Historical Provider, MD  Probiotic Product (PRO-BIOTIC BLEND PO) Take 1 tablet by mouth daily.    Historical Provider, MD  saw palmetto 160 MG capsule Take 160 mg by mouth 2 (two) times daily.    Historical Provider, MD  tamsulosin (FLOMAX) 0.4 MG CAPS capsule Take 1 capsule (0.4 mg total) by mouth daily. 10/29/15   Collier Salina  Sherwood Gambler, MD  traMADol (ULTRAM) 50 MG tablet Take 50 mg by mouth every 6 (six) hours as needed for moderate pain.    Historical Provider, MD  traZODone (DESYREL) 100 MG tablet Take 1 tablet (100 mg total) by mouth at bedtime. 10/03/15   Marletta Lor, MD   BP 96/77 mmHg  Pulse 100  Temp(Src) 97.7 F (36.5 C) (Rectal)  Resp 15  SpO2 100% Physical Exam  Constitutional: He is oriented to person, place, and time. He appears well-developed and well-nourished. No distress.  HENT:  Head: Normocephalic and atraumatic.  Mouth/Throat: Oropharynx is clear and moist.  Eyes: EOM are normal.  Neck: Neck supple. No JVD present.  Cardiovascular:  Normal rate, regular rhythm, normal heart sounds and intact distal pulses.   Pulmonary/Chest: Effort normal and breath sounds normal.  Abdominal: Soft. He exhibits no distension. There is no tenderness.  Musculoskeletal: Normal range of motion. He exhibits no edema.  Neurological: He is alert and oriented to person, place, and time. No cranial nerve deficit.  Skin: Skin is warm and dry.  Psychiatric: His behavior is normal.    ED Course  .Intubation Date/Time: 11/11/2015 10:58 PM Performed by: Gustavus Bryant Authorized by: Gustavus Bryant Consent: The procedure was performed in an emergent situation. Indications: airway protection and  respiratory failure Intubation method: video-assisted Patient status: paralyzed (RSI) Preoxygenation: ILMA/LMA Sedatives: etomidate Paralytic: rocuronium Laryngoscope size: video mac 4 blade. Tube size: 7.5 mm Tube type: cuffed Number of attempts: 2 Ventilation between attempts: BVM Cricoid pressure: no Cords visualized: yes Post-procedure assessment: chest rise and CO2 detector Breath sounds: equal Cuff inflated: yes ETT to lip: 23 cm Tube secured with: ETT holder Chest x-ray interpreted by me and radiologist. Chest x-ray findings: endotracheal tube in appropriate position Patient tolerance: Patient tolerated the procedure well with no immediate complications     Labs Review Labs Reviewed  CBC WITH DIFFERENTIAL/PLATELET - Abnormal; Notable for the following:    WBC 18.3 (*)    Neutro Abs 13.8 (*)    All other components within normal limits  COMPREHENSIVE METABOLIC PANEL - Abnormal; Notable for the following:    Potassium 2.9 (*)    Glucose, Bld 301 (*)    Creatinine, Ser 1.53 (*)    Total Protein 5.8 (*)    AST 267 (*)    ALT 225 (*)    GFR calc non Af Amer 45 (*)    GFR calc Af Amer 52 (*)    All other components within normal limits  I-STAT TROPOININ, ED - Abnormal; Notable for the following:    Troponin i, poc 3.87 (*)     All other components within normal limits    Imaging Review Dg Chest Portable 1 View  11/11/2015  CLINICAL DATA:  Myocardial infarction.  Post resuscitation. EXAM: PORTABLE CHEST 1 VIEW COMPARISON:  03/29/2015 FINDINGS: Endotracheal tube tip is 2.6 cm above the carina. The nasogastric tube extends into the stomach. The lungs are clear except for mild atelectatic appearing linear basilar opacities on the left. No confluent airspace consolidation. No large effusion. No pneumothorax. Mild central vascular prominence, which can be physiologic in the supine position. IMPRESSION: Support equipment appears satisfactorily positioned. Electronically Signed   By: Andreas Newport M.D.   On: 11/11/2015 22:12   I have personally reviewed and evaluated these images and lab results as part of my medical decision-making.  MDM   Final diagnoses:  ST elevation (STEMI) myocardial infarction involving left anterior descending coronary artery ([place])  Endotracheally intubated    Post witnessed arrest. CPR, epi x 2, defibrillation x 2 by AED. EMS placed supraglottic airway. PEA on arrival. ROSC achieved PTA. Epi gtt given for hypotension en route. STEMI seen on EMS EKG. ETT placed for definitive airway protection and respiratory failure. Cardiology at bedside and will take patient to cath lab. Heparin gtt initiated. Critical Care consulted for further management. Hypothermia protocol initiated. Troponin >3.0. Care assumed by Cardiology, pt taken to cath lab.  Discussed with Dr. Ashok Cordia.  Gustavus Bryant, MD 11/11/15 [place], MD 11/12/15 [id number]

## 2015-11-11 NOTE — Procedures (Signed)
Intubation Procedure Note [name] [id number] 10/14/1946  Procedure: Intubation Indications: Airway protection and maintenance  Procedure Details Consent: Risks of procedure as well as the alternatives and risks of each were explained to the (patient/caregiver).  Consent for procedure obtained. Time Out: Verified patient identification, verified procedure, site/side was marked, verified correct patient position, special equipment/implants available, medications/allergies/relevent history reviewed, required imaging and test results available.  Performed  Maximum sterile technique was used including antiseptics.  Miller    Evaluation Hemodynamic Status: BP stable throughout; O2 sats: stable throughout Patient's Current Condition: stable Complications: No apparent complications Patient did tolerate procedure well. Chest X-ray ordered to verify placement.  CXR: pending.   Chriss [facility] 11/11/2015

## 2015-11-12 ENCOUNTER — Inpatient Hospital Stay (HOSPITAL_COMMUNITY): Payer: BLUE CROSS/BLUE SHIELD

## 2015-11-12 ENCOUNTER — Encounter (HOSPITAL_COMMUNITY): Payer: Self-pay

## 2015-11-12 DIAGNOSIS — I2510 Atherosclerotic heart disease of native coronary artery without angina pectoris: Secondary | ICD-10-CM

## 2015-11-12 DIAGNOSIS — R40244 Other coma, without documented Glasgow coma scale score, or with partial score reported, unspecified time: Secondary | ICD-10-CM

## 2015-11-12 DIAGNOSIS — I469 Cardiac arrest, cause unspecified: Secondary | ICD-10-CM

## 2015-11-12 DIAGNOSIS — Z9861 Coronary angioplasty status: Secondary | ICD-10-CM

## 2015-11-12 DIAGNOSIS — R4020 Unspecified coma: Secondary | ICD-10-CM | POA: Diagnosis present

## 2015-11-12 DIAGNOSIS — R579 Shock, unspecified: Secondary | ICD-10-CM | POA: Diagnosis present

## 2015-11-12 DIAGNOSIS — I255 Ischemic cardiomyopathy: Secondary | ICD-10-CM | POA: Diagnosis present

## 2015-11-12 DIAGNOSIS — J9601 Acute respiratory failure with hypoxia: Secondary | ICD-10-CM

## 2015-11-12 DIAGNOSIS — I2102 ST elevation (STEMI) myocardial infarction involving left anterior descending coronary artery: Principal | ICD-10-CM

## 2015-11-12 LAB — GLUCOSE, CAPILLARY
GLUCOSE-CAPILLARY: 132 mg/dL — AB (ref 65–99)
GLUCOSE-CAPILLARY: 133 mg/dL — AB (ref 65–99)
GLUCOSE-CAPILLARY: 137 mg/dL — AB (ref 65–99)
GLUCOSE-CAPILLARY: 163 mg/dL — AB (ref 65–99)
GLUCOSE-CAPILLARY: 179 mg/dL — AB (ref 65–99)
GLUCOSE-CAPILLARY: 198 mg/dL — AB (ref 65–99)
GLUCOSE-CAPILLARY: 237 mg/dL — AB (ref 65–99)
GLUCOSE-CAPILLARY: 239 mg/dL — AB (ref 65–99)
GLUCOSE-CAPILLARY: 246 mg/dL — AB (ref 65–99)
GLUCOSE-CAPILLARY: 253 mg/dL — AB (ref 65–99)
GLUCOSE-CAPILLARY: 297 mg/dL — AB (ref 65–99)
GLUCOSE-CAPILLARY: 298 mg/dL — AB (ref 65–99)
GLUCOSE-CAPILLARY: 299 mg/dL — AB (ref 65–99)
GLUCOSE-CAPILLARY: 309 mg/dL — AB (ref 65–99)
Glucose-Capillary: 235 mg/dL — ABNORMAL HIGH (ref 65–99)
Glucose-Capillary: 249 mg/dL — ABNORMAL HIGH (ref 65–99)
Glucose-Capillary: 262 mg/dL — ABNORMAL HIGH (ref 65–99)
Glucose-Capillary: 300 mg/dL — ABNORMAL HIGH (ref 65–99)
Glucose-Capillary: 291 mg/dL — ABNORMAL HIGH (ref 65–99)
Glucose-Capillary: 202 mg/dL — ABNORMAL HIGH (ref 65–99)
Glucose-Capillary: 180 mg/dL — ABNORMAL HIGH (ref 65–99)
Glucose-Capillary: 153 mg/dL — ABNORMAL HIGH (ref 65–99)
Glucose-Capillary: 132 mg/dL — ABNORMAL HIGH (ref 65–99)

## 2015-11-12 LAB — POCT I-STAT, CHEM 8
BUN: 19 mg/dL (ref 6–20)
BUN: 16 mg/dL (ref 6–20)
CHLORIDE: 101 mmol/L (ref 101–111)
CREATININE: 0.7 mg/dL (ref 0.61–1.24)
Calcium, Ion: 1.21 mmol/L (ref 1.13–1.30)
Calcium, Ion: 1.06 mmol/L — ABNORMAL LOW (ref 1.13–1.30)
Chloride: 111 mmol/L (ref 101–111)
Creatinine, Ser: 1 mg/dL (ref 0.61–1.24)
GLUCOSE: 254 mg/dL — AB (ref 65–99)
Glucose, Bld: 252 mg/dL — ABNORMAL HIGH (ref 65–99)
HEMATOCRIT: 47 % (ref 39.0–52.0)
HEMATOCRIT: 44 % (ref 39.0–52.0)
HEMOGLOBIN: 15 g/dL (ref 13.0–17.0)
Hemoglobin: 16 g/dL (ref 13.0–17.0)
POTASSIUM: 3.5 mmol/L (ref 3.5–5.1)
POTASSIUM: 2.8 mmol/L — AB (ref 3.5–5.1)
SODIUM: 138 mmol/L (ref 135–145)
Sodium: 137 mmol/L (ref 135–145)
TCO2: 22 mmol/L (ref 0–100)
TCO2: 18 mmol/L (ref 0–100)

## 2015-11-12 LAB — BASIC METABOLIC PANEL
ANION GAP: 10 (ref 5–15)
ANION GAP: 9 (ref 5–15)
Anion gap: 10 (ref 5–15)
Anion gap: 11 (ref 5–15)
Anion gap: 11 (ref 5–15)
Anion gap: 9 (ref 5–15)
BUN: 15 mg/dL (ref 6–20)
BUN: 15 mg/dL (ref 6–20)
BUN: 16 mg/dL (ref 6–20)
BUN: 16 mg/dL (ref 6–20)
BUN: 16 mg/dL (ref 6–20)
BUN: 18 mg/dL (ref 6–20)
CALCIUM: 8 mg/dL — AB (ref 8.9–10.3)
CALCIUM: 8.1 mg/dL — AB (ref 8.9–10.3)
CALCIUM: 8.3 mg/dL — AB (ref 8.9–10.3)
CALCIUM: 8.7 mg/dL — AB (ref 8.9–10.3)
CHLORIDE: 106 mmol/L (ref 101–111)
CHLORIDE: 105 mmol/L (ref 101–111)
CHLORIDE: 110 mmol/L (ref 101–111)
CO2: 18 mmol/L — AB (ref 22–32)
CO2: 18 mmol/L — AB (ref 22–32)
CO2: 19 mmol/L — AB (ref 22–32)
CO2: 19 mmol/L — AB (ref 22–32)
CO2: 19 mmol/L — ABNORMAL LOW (ref 22–32)
CO2: 23 mmol/L (ref 22–32)
CREATININE: 0.85 mg/dL (ref 0.61–1.24)
CREATININE: 1 mg/dL (ref 0.61–1.24)
CREATININE: 1.02 mg/dL (ref 0.61–1.24)
CREATININE: 1.08 mg/dL (ref 0.61–1.24)
CREATININE: 1.16 mg/dL (ref 0.61–1.24)
Calcium: 8.2 mg/dL — ABNORMAL LOW (ref 8.9–10.3)
Calcium: 8.3 mg/dL — ABNORMAL LOW (ref 8.9–10.3)
Chloride: 105 mmol/L (ref 101–111)
Chloride: 107 mmol/L (ref 101–111)
Chloride: 108 mmol/L (ref 101–111)
Creatinine, Ser: 0.89 mg/dL (ref 0.61–1.24)
GFR calc Af Amer: >60 mL/min (ref >60)
GFR calc Af Amer: >60 mL/min (ref >60)
GFR calc Af Amer: >60 mL/min (ref >60)
GFR calc Af Amer: >60 mL/min (ref >60)
GFR calc Af Amer: >60 mL/min (ref >60)
GFR calc non Af Amer: >60 mL/min (ref >60)
GFR calc non Af Amer: >60 mL/min (ref >60)
GFR calc non Af Amer: >60 mL/min (ref >60)
GFR calc non Af Amer: >60 mL/min (ref >60)
GLUCOSE: 188 mg/dL — AB (ref 65–99)
GLUCOSE: 230 mg/dL — AB (ref 65–99)
GLUCOSE: 249 mg/dL — AB (ref 65–99)
GLUCOSE: 263 mg/dL — AB (ref 65–99)
GLUCOSE: 292 mg/dL — AB (ref 65–99)
GLUCOSE: 327 mg/dL — AB (ref 65–99)
POTASSIUM: 4.6 mmol/L (ref 3.5–5.1)
POTASSIUM: 3.6 mmol/L (ref 3.5–5.1)
Potassium: 3.4 mmol/L — ABNORMAL LOW (ref 3.5–5.1)
Potassium: 3.6 mmol/L (ref 3.5–5.1)
Potassium: 3.6 mmol/L (ref 3.5–5.1)
Potassium: 3.3 mmol/L — ABNORMAL LOW (ref 3.5–5.1)
Sodium: 138 mmol/L (ref 135–145)
Sodium: 137 mmol/L (ref 135–145)
Sodium: 135 mmol/L (ref 135–145)
Sodium: 134 mmol/L — ABNORMAL LOW (ref 135–145)
Sodium: 136 mmol/L (ref 135–145)
Sodium: 137 mmol/L (ref 135–145)

## 2015-11-12 LAB — BLOOD GAS, ARTERIAL
ACID-BASE DEFICIT: 1 mmol/L (ref 0.0–2.0)
ACID-BASE DEFICIT: 5.5 mmol/L — AB (ref 0.0–2.0)
BICARBONATE: 18.5 meq/L — AB (ref 20.0–24.0)
Bicarbonate: 23.3 mEq/L (ref 20.0–24.0)
DRAWN BY: 365271
Drawn by: 365271
FIO2: 0.4
FIO2: 1
LHR: 20 {breaths}/min
MECHVT: 550 mL
MECHVT: 550 mL
O2 SAT: 99 %
O2 Saturation: 99.3 %
PATIENT TEMPERATURE: 98.6
PCO2 ART: 31.2 mmHg — AB (ref 35.0–45.0)
PEEP/CPAP: 5 cmH2O
PEEP/CPAP: 5 cmH2O
PH ART: 7.391 (ref 7.350–7.450)
PO2 ART: 164 mmHg — AB (ref 80.0–100.0)
PO2 ART: 212 mmHg — AB (ref 80.0–100.0)
Patient temperature: 96.9
RATE: 20 resp/min
TCO2: 19.5 mmol/L (ref 0–100)
TCO2: 24.6 mmol/L (ref 0–100)
pCO2 arterial: 38.2 mmHg (ref 35.0–45.0)
pH, Arterial: 7.397 (ref 7.350–7.450)

## 2015-11-12 LAB — HEPATIC FUNCTION PANEL
ALBUMIN: 3.6 g/dL (ref 3.5–5.0)
ALT: 207 U/L — ABNORMAL HIGH (ref 17–63)
AST: 250 U/L — AB (ref 15–41)
Alkaline Phosphatase: 67 U/L (ref 38–126)
Total Bilirubin: 1.1 mg/dL (ref 0.3–1.2)
Total Protein: 6.1 g/dL — ABNORMAL LOW (ref 6.5–8.1)

## 2015-11-12 LAB — CBC
HCT: 43.9 % (ref 39.0–52.0)
HEMATOCRIT: 44.2 % (ref 39.0–52.0)
HEMATOCRIT: 44.4 % (ref 39.0–52.0)
HEMOGLOBIN: 15.7 g/dL (ref 13.0–17.0)
HEMOGLOBIN: 15.7 g/dL (ref 13.0–17.0)
HEMOGLOBIN: 15.8 g/dL (ref 13.0–17.0)
MCH: 31.6 pg (ref 26.0–34.0)
MCH: 31.9 pg (ref 26.0–34.0)
MCH: 31.8 pg (ref 26.0–34.0)
MCHC: 35.5 g/dL (ref 30.0–36.0)
MCHC: 35.6 g/dL (ref 30.0–36.0)
MCHC: 35.8 g/dL (ref 30.0–36.0)
MCV: 88.9 fL (ref 78.0–100.0)
MCV: 88.9 fL (ref 78.0–100.0)
MCV: 89.7 fL (ref 78.0–100.0)
Platelets: 195 10*3/uL (ref 150–400)
Platelets: 171 10*3/uL (ref 150–400)
Platelets: 233 10*3/uL (ref 150–400)
RBC: 4.94 MIL/uL (ref 4.22–5.81)
RBC: 4.95 MIL/uL (ref 4.22–5.81)
RBC: 4.97 MIL/uL (ref 4.22–5.81)
RDW: 13.7 % (ref 11.5–15.5)
RDW: 13.8 % (ref 11.5–15.5)
RDW: 13.8 % (ref 11.5–15.5)
WBC: 16.9 10*3/uL — ABNORMAL HIGH (ref 4.0–10.5)
WBC: 18.9 10*3/uL — AB (ref 4.0–10.5)
WBC: 28 10*3/uL — AB (ref 4.0–10.5)

## 2015-11-12 LAB — TROPONIN I
TROPONIN I: 12.23 ng/mL — AB (ref <0.031)
TROPONIN I: 26.88 ng/mL — AB (ref <0.031)
TROPONIN I: 42.25 ng/mL — AB (ref <0.031)
Troponin I: 8.53 ng/mL (ref <0.031)

## 2015-11-12 LAB — PHOSPHORUS
PHOSPHORUS: 1.7 mg/dL — AB (ref 2.5–4.6)
Phosphorus: 1.9 mg/dL — ABNORMAL LOW (ref 2.5–4.6)

## 2015-11-12 LAB — MAGNESIUM
MAGNESIUM: 1.8 mg/dL (ref 1.7–2.4)
Magnesium: 3 mg/dL — ABNORMAL HIGH (ref 1.7–2.4)

## 2015-11-12 LAB — URINE MICROSCOPIC-ADD ON

## 2015-11-12 LAB — PROCALCITONIN: PROCALCITONIN: 0.22 ng/mL

## 2015-11-12 LAB — URINALYSIS, ROUTINE W REFLEX MICROSCOPIC
Glucose, UA: >1000 mg/dL — AB
KETONES UR: 15 mg/dL — AB
LEUKOCYTES UA: NEGATIVE
Nitrite: NEGATIVE
PROTEIN: 100 mg/dL — AB
Specific Gravity, Urine: 1.043 — ABNORMAL HIGH (ref 1.005–1.030)
pH: 5.5 (ref 5.0–8.0)

## 2015-11-12 LAB — TSH: TSH: 0.653 u[IU]/mL (ref 0.350–4.500)

## 2015-11-12 LAB — PROTIME-INR
INR: 1.19 (ref 0.00–1.49)
INR: 1.11 (ref 0.00–1.49)
PROTHROMBIN TIME: 14.4 s (ref 11.6–15.2)
Prothrombin Time: 15.3 seconds — ABNORMAL HIGH (ref 11.6–15.2)

## 2015-11-12 LAB — CORTISOL: CORTISOL PLASMA: 11.1 ug/dL

## 2015-11-12 LAB — MRSA PCR SCREENING: MRSA by PCR: NEGATIVE

## 2015-11-12 LAB — APTT
APTT: 24 s (ref 24–37)
APTT: 75 s — AB (ref 24–37)

## 2015-11-12 MED ORDER — DEXTROSE 5 % IV SOLN
0.0000 ug/min | INTRAVENOUS | Status: DC
  Administered 2015-11-12: 34 ug/min via INTRAVENOUS
  Administered 2015-11-12: 12 ug/min via INTRAVENOUS
  Administered 2015-11-13: 28 ug/min via INTRAVENOUS
  Administered 2015-11-13: 25 ug/min via INTRAVENOUS
  Administered 2015-11-15: 5 ug/min via INTRAVENOUS
  Filled 2015-11-12 (×7): qty 16

## 2015-11-12 MED ORDER — FENTANYL BOLUS VIA INFUSION
25.0000 ug | INTRAVENOUS | Status: DC | PRN
  Administered 2015-11-12 – 2015-11-14 (×4): 25 ug via INTRAVENOUS
  Filled 2015-11-12: qty 25

## 2015-11-12 MED ORDER — SODIUM CHLORIDE 0.9 % IV SOLN
INTRAVENOUS | Status: DC
  Administered 2015-11-12: 2.4 [IU]/h via INTRAVENOUS
  Filled 2015-11-12 (×2): qty 2.5

## 2015-11-12 MED ORDER — ARTIFICIAL TEARS OP OINT
1.0000 | TOPICAL_OINTMENT | Freq: Three times a day (TID) | OPHTHALMIC | Status: DC
Start: 2015-11-12 — End: 2015-11-14
  Administered 2015-11-12 – 2015-11-14 (×8): 1 via OPHTHALMIC
  Filled 2015-11-12: qty 3.5

## 2015-11-12 MED ORDER — POTASSIUM CHLORIDE 10 MEQ/100ML IV SOLN: 10.0000 meq | INTRAVENOUS | Status: DC

## 2015-11-12 MED ORDER — SODIUM CHLORIDE 0.9 % IV SOLN
2000.0000 mL | Freq: Once | INTRAVENOUS | Status: AC
  Administered 2015-11-12: 2000 mL via INTRAVENOUS

## 2015-11-12 MED ORDER — NOREPINEPHRINE BITARTRATE 1 MG/ML IV SOLN
0.0000 ug/min | INTRAVENOUS | Status: DC
  Administered 2015-11-12: 5 ug/min via INTRAVENOUS
  Filled 2015-11-12 (×2): qty 4

## 2015-11-12 MED ORDER — CHLORHEXIDINE GLUCONATE 0.12% ORAL RINSE (MEDLINE KIT)
15.0000 mL | Freq: Two times a day (BID) | OROMUCOSAL | Status: DC
  Administered 2015-11-12: 15 mL via OROMUCOSAL

## 2015-11-12 MED ORDER — LEVOFLOXACIN IN D5W 750 MG/150ML IV SOLN
750.0000 mg | INTRAVENOUS | Status: AC
  Administered 2015-11-12 – 2015-11-16 (×5): 750 mg via INTRAVENOUS
  Filled 2015-11-12 (×5): qty 150

## 2015-11-12 MED ORDER — SODIUM CHLORIDE 0.9 % IV SOLN
1.0000 ug/kg/min | INTRAVENOUS | Status: DC
  Administered 2015-11-12: 1 ug/kg/min via INTRAVENOUS
  Administered 2015-11-13: 1.5 ug/kg/min via INTRAVENOUS
  Filled 2015-11-12 (×2): qty 20

## 2015-11-12 MED ORDER — POTASSIUM CHLORIDE 10 MEQ/50ML IV SOLN
10.0000 meq | INTRAVENOUS | Status: AC
  Administered 2015-11-12 (×4): 10 meq via INTRAVENOUS
  Filled 2015-11-12 (×4): qty 50

## 2015-11-12 MED ORDER — MIDAZOLAM HCL 2 MG/2ML IJ SOLN
1.0000 mg | Freq: Once | INTRAMUSCULAR | Status: AC
  Administered 2015-11-12: 1 mg via INTRAVENOUS

## 2015-11-12 MED ORDER — SODIUM CHLORIDE 0.9 % IV SOLN
25.0000 ug/h | INTRAVENOUS | Status: DC
  Administered 2015-11-12: 250 ug/h via INTRAVENOUS
  Administered 2015-11-12 – 2015-11-13 (×3): 100 ug/h via INTRAVENOUS
  Administered 2015-11-13: 200 ug/h via INTRAVENOUS
  Filled 2015-11-12 (×4): qty 50

## 2015-11-12 MED ORDER — CHLORHEXIDINE GLUCONATE 0.12% ORAL RINSE (MEDLINE KIT)
15.0000 mL | Freq: Two times a day (BID) | OROMUCOSAL | Status: DC
  Administered 2015-11-12 – 2015-11-21 (×18): 15 mL via OROMUCOSAL

## 2015-11-12 MED ORDER — FAMOTIDINE IN NACL 20-0.9 MG/50ML-% IV SOLN
20.0000 mg | Freq: Two times a day (BID) | INTRAVENOUS | Status: DC
  Administered 2015-11-12 – 2015-11-16 (×9): 20 mg via INTRAVENOUS
  Filled 2015-11-12 (×10): qty 50

## 2015-11-12 MED ORDER — MAGNESIUM SULFATE 2 GM/50ML IV SOLN
2.0000 g | Freq: Once | INTRAVENOUS | Status: AC
  Administered 2015-11-12: 2 g via INTRAVENOUS
  Filled 2015-11-12: qty 50

## 2015-11-12 MED ORDER — SODIUM PHOSPHATE 3 MMOLE/ML IV SOLN
30.0000 mmol | Freq: Once | INTRAVENOUS | Status: AC
  Administered 2015-11-12: 30 mmol via INTRAVENOUS
  Filled 2015-11-12: qty 10

## 2015-11-12 MED ORDER — ANTISEPTIC ORAL RINSE SOLUTION (CORINZ): 7.0000 mL | OROMUCOSAL | Status: DC

## 2015-11-12 MED ORDER — ANTISEPTIC ORAL RINSE SOLUTION (CORINZ)
7.0000 mL | Freq: Four times a day (QID) | OROMUCOSAL | Status: DC
  Administered 2015-11-12: 7 mL via OROMUCOSAL

## 2015-11-12 MED ORDER — CISATRACURIUM BOLUS VIA INFUSION
0.0500 mg/kg | INTRAVENOUS | Status: DC | PRN
  Filled 2015-11-12: qty 5

## 2015-11-12 MED ORDER — FENTANYL CITRATE (PF) 100 MCG/2ML IJ SOLN
50.0000 ug | Freq: Once | INTRAMUSCULAR | Status: AC
  Administered 2015-11-12: 50 ug via INTRAVENOUS

## 2015-11-12 MED ORDER — MIDAZOLAM BOLUS VIA INFUSION
1.0000 mg | INTRAVENOUS | Status: DC | PRN
  Filled 2015-11-12: qty 1

## 2015-11-12 MED ORDER — CISATRACURIUM BOLUS VIA INFUSION
0.1000 mg/kg | Freq: Once | INTRAVENOUS | Status: AC
  Administered 2015-11-12: 8.5 mg via INTRAVENOUS
  Filled 2015-11-12: qty 9

## 2015-11-12 MED ORDER — POTASSIUM CHLORIDE 20 MEQ/15ML (10%) PO SOLN
40.0000 meq | Freq: Once | ORAL | Status: AC
  Administered 2015-11-12: 40 meq
  Filled 2015-11-12: qty 30

## 2015-11-12 MED ORDER — SODIUM CHLORIDE 0.9 % IV BOLUS (SEPSIS)
250.0000 mL | Freq: Once | INTRAVENOUS | Status: AC
  Administered 2015-11-12: 250 mL via INTRAVENOUS

## 2015-11-12 MED ORDER — POTASSIUM CHLORIDE 20 MEQ/15ML (10%) PO SOLN
40.0000 meq | ORAL | Status: DC
  Administered 2015-11-12: 40 meq
  Filled 2015-11-12: qty 30

## 2015-11-12 MED ORDER — INSULIN ASPART 100 UNIT/ML ~~LOC~~ SOLN: 2.0000 [IU] | SUBCUTANEOUS | Status: DC

## 2015-11-12 MED ORDER — VECURONIUM BROMIDE 10 MG IV SOLR
5.0000 mg | Freq: Once | INTRAVENOUS | Status: DC
  Filled 2015-11-12: qty 10

## 2015-11-12 MED ORDER — FAMOTIDINE IN NACL 20-0.9 MG/50ML-% IV SOLN
20.0000 mg | Freq: Two times a day (BID) | INTRAVENOUS | Status: DC
  Administered 2015-11-12: 20 mg via INTRAVENOUS

## 2015-11-12 MED ORDER — FENTANYL CITRATE (PF) 100 MCG/2ML IJ SOLN
INTRAMUSCULAR | Status: AC
  Administered 2015-11-12: 50 ug
  Filled 2015-11-12: qty 2

## 2015-11-12 MED ORDER — SODIUM CHLORIDE 0.9 % IV SOLN
1.0000 mg/h | INTRAVENOUS | Status: DC
  Administered 2015-11-12: 4 mg/h via INTRAVENOUS
  Administered 2015-11-12 (×2): 8 mg/h via INTRAVENOUS
  Administered 2015-11-12: 2 mg/h via INTRAVENOUS
  Administered 2015-11-13: 4 mg/h via INTRAVENOUS
  Administered 2015-11-13 – 2015-11-14 (×2): 8 mg/h via INTRAVENOUS
  Filled 2015-11-12 (×8): qty 10

## 2015-11-12 MED ORDER — ANTISEPTIC ORAL RINSE SOLUTION (CORINZ)
7.0000 mL | OROMUCOSAL | Status: DC
  Administered 2015-11-12 – 2015-11-21 (×95): 7 mL via OROMUCOSAL

## 2015-11-12 NOTE — Progress Notes (Signed)
   11/12/15 0700  Clinical Encounter Type  Visited With Family  Visit Type Spiritual support  Referral From Nurse  Spiritual Encounters  Spiritual Needs Prayer;Emotional;Other (Comment) (Liaison and hospitality)  Stress Factors  Family Stress Factors Lack of knowledge;Health changes;Loss of control  Responded to Code Stemi at 21:30 11/11/15. Patient had had massive heart attack and family was visibly distraught. Chaplain provided meal to daughter, acted as Dentist between doctors and family until called away to active dying. Went back later to see family in [facility] waiting room and get information for them. Stopped back by about 4 am but family had gone home to get some sleep.

## 2015-11-12 NOTE — Progress Notes (Signed)
ANTIBIOTIC CONSULT NOTE - INITIAL  Pharmacy Consult for levaquin Indication: PNA  Allergies  Allergen Reactions  . Contrast Media [Iodinated Diagnostic Agents] Hives    03/28/14 pt received 1 hour emergent prep and pt did good and had no complaints after scan.  . Ibuprofen     REACTION: unspecified  . Penicillins     REACTION: unspecified  . Sulfamethoxazole     REACTION: unspecified    Patient Measurements: Height: 5\' 7"  (170.2 cm) Weight: 186 lb 4.6 oz (84.5 kg) IBW/kg (Calculated) : 66.1  Vital Signs: Temp: 91.6 F (33.1 C) (11/30 1300) Temp Source: Core (Comment) (11/30 1300) BP: 105/65 mmHg (11/30 1240) Pulse Rate: 59 (11/30 1300) Intake/Output from previous day: 11/29 0701 - 11/30 0700 In: 874.8 [I.V.:384.8; NG/GT:130; IV Piggyback:360] Out: 1410 [Urine:1410] Intake/Output from this shift: Total I/O In: 544.2 [I.V.:484.2; NG/GT:60] Out: 375 [Urine:375]  Labs:  Recent Labs  11/12/15 0015  11/12/15 0259 11/12/15 0400 11/12/15 0804 11/12/15 0932  WBC 16.9*  --   --  18.9* 28.0*  --   HGB 15.7  < > 15.0 15.7 15.8  --   PLT 195  --   --  171 233  --   CREATININE 1.16  < > 0.70 0.85 0.89 1.00  < > = values in this interval not displayed. Estimated Creatinine Clearance: 73.5 mL/min (by C-G formula based on Cr of 1). No results for input(s): VANCOTROUGH, VANCOPEAK, VANCORANDOM, GENTTROUGH, GENTPEAK, GENTRANDOM, TOBRATROUGH, TOBRAPEAK, TOBRARND, AMIKACINPEAK, AMIKACINTROU, AMIKACIN in the last 72 hours.   Microbiology: Recent Results (from the past 720 hour(s))  MRSA PCR Screening     Status: None   Collection Time: 11/12/15 12:51 AM  Result Value Ref Range Status   MRSA by PCR NEGATIVE NEGATIVE Final    Comment:        The GeneXpert MRSA Assay (FDA approved for NASAL specimens only), is one component of a comprehensive MRSA colonization surveillance program. It is not intended to diagnose MRSA infection nor to guide or monitor treatment for MRSA  infections.     Medical History: Past Medical History  Diagnosis Date  . Rheumatoid arthritis(714.0) 1998  . Nephrolithiasis 1968  . Insomnia   . Allergic rhinitis   . Impaired glucose tolerance   . Hypertension   . ST elevation (STEMI) myocardial infarction involving left anterior descending coronary artery ([place]) 11/11/2015    100% LAD, Cardiogenic Shock. -- EF ~20-25%  . Cardiac arrest with ventricular fibrillation ([place]) 11/11/2015  . CAD S/P PCI OF pLAD with 3.5 mm x38 mm Promus DES (3.75 mm) 11/12/2015  . Cardiomyopathy, ischemic - EF 20-25% by LV Gram 11/12/2015    There is severe left ventricular systolic dysfunction. The left ventricular ejection fraction is 25-35% by visual estimate. There are wall motion abnormalities in the left ventricle. There are segmental wall motion abnormalities in the left ventricle. The anterolateral and periapical LV walls are akinetic with an estimated LVEF of 25%     Medications:  Prescriptions prior to admission  Medication Sig Dispense Refill Last Dose  . tamsulosin (FLOMAX) 0.4 MG CAPS capsule Take 1 capsule (0.4 mg total) by mouth daily. 90 capsule 0 unknown  . Cetirizine HCl 10 MG CAPS Take 10 mg by mouth daily.   Taking  . Cholecalciferol (VITAMIN D-3) 1000 UNITS CAPS Take 1,000 Units by mouth daily.   Taking  . Coenzyme Q10 (CO Q 10) 10 MG CAPS Take 10 mg by mouth daily.   Taking  . fexofenadine (ALLEGRA)  30 MG tablet Take 30 mg by mouth daily.   Taking  . fluticasone (FLONASE) 50 MCG/ACT nasal spray USE 2 SPRAYS INTO NOSE DAILY 48 g 3 Taking  . folic acid (FOLVITE) 1 MG tablet Take 1 mg by mouth daily.     Taking  . Golimumab ([place] ARIA IV) Inject into the vein. Infusion every 8 weeks   Taking  . hydrochlorothiazide (HYDRODIURIL) 25 MG tablet Take 1 tablet daily 90 tablet 1   . HYDROcodone-acetaminophen (NORCO/VICODIN) 5-325 MG per tablet Take 1-2 tablets by mouth every 6 (six) hours as needed for moderate pain. (Patient not taking:  Reported on 11/12/2015) 20 tablet 0 Not Taking at Unknown time  . meloxicam (MOBIC) 7.5 MG tablet Take 1 tablet (7.5 mg total) by mouth 2 (two) times daily. 90 tablet 3 Taking  . methotrexate (RHEUMATREX) 2.5 MG tablet Take 2.5 mg by mouth once a week. 8 tablets every week Caution:Chemotherapy. Protect from light.   Taking  . oxymetazoline (AFRIN) 0.05 % nasal spray Place 1 spray into both nostrils 2 (two) times daily.   Taking  . Probiotic Product (PRO-BIOTIC BLEND PO) Take 1 tablet by mouth daily.   Taking  . saw palmetto 160 MG capsule Take 160 mg by mouth 2 (two) times daily.   Taking  . traMADol (ULTRAM) 50 MG tablet Take 50 mg by mouth every 6 (six) hours as needed for moderate pain.   Taking  . traZODone (DESYREL) 100 MG tablet Take 1 tablet (100 mg total) by mouth at bedtime. 90 tablet 1 Taking   Scheduled:  . antiseptic oral rinse  7 mL Mouth Rinse 10 times per day  . artificial tears  1 application Both Eyes 3 times per day  . aspirin  81 mg Oral Daily  . chlorhexidine gluconate  15 mL Mouth Rinse BID  . famotidine (PEPCID) IV  20 mg Intravenous Q12H  . heparin  5,000 Units Subcutaneous 3 times per day  . sodium chloride  3 mL Intravenous Q12H  . ticagrelor  90 mg Oral BID  . vecuronium  5 mg Intravenous Once    Assessment: 69 yo male s/o out of hospital cardiac arrest on the hypothermia protocol and s/p cath with DES to the LAD. Pharmacy has been consulted to dose levaquin for CAP. WBC= 28, afeb, CrCl ~ 70 and CXR with new/worsening central alveolar edema or infiltrate.   11/30 levaquin  Plan: -Levaquin 750mg  V q24hr -Will follow renal function and clinical progress  Hildred Laser, Pharm D 11/12/2015 1:36 PM

## 2015-11-12 NOTE — Procedures (Signed)
Arterial Catheter Insertion Procedure Note DAYLIN VOGLER [id number] Apr 27, 1946  Procedure: Insertion of Arterial Catheter  Indications: Blood pressure monitoring and Frequent blood sampling  Procedure Details Consent: Unable to obtain consent because of emergent medical necessity. Time Out: Verified patient identification, verified procedure, site/side was marked, verified correct patient position, special equipment/implants available, medications/allergies/relevent history reviewed, required imaging and test results available.  Performed  Maximum sterile technique was used including antiseptics, cap, gloves, gown, hand hygiene, mask and sheet. Skin prep: Chlorhexidine; local anesthetic administered 20 gauge catheter was inserted into left radial artery using the Seldinger technique.  Evaluation Blood flow good; BP tracing good. Complications: No apparent complications.   Dimple Nanas 11/12/2015

## 2015-11-12 NOTE — Plan of Care (Signed)
Problem: Phase I Progression Outcomes Goal: Cool target temp reached 2 hrs from start Outcome: Completed/Met Date Met:  11/12/15 Reached goal in three hours. [id number]     

## 2015-11-12 NOTE — Consult Note (Signed)
PULMONARY / CRITICAL CARE MEDICINE   Name: MYRA GIACONE MRN: [id number] DOB: 26-Jul-1946    ADMISSION DATE:  11/11/2015 CONSULTATION DATE:  11/11/2015  REFERRING MD :  Dr. Burt Knack  CHIEF COMPLAINT:  Cardiac arrest  HISTORY OF PRESENT ILLNESS:  70 year old male with PMH as below, which is significant for RA and HTN. He presented to [facility] ED 11/29 after a witnessed cardiac arrest at home. He has had intermittent chest and arm pain for about 2 weeks status post mechanical fall. 11/29 chest pain acutely worsened and he unfortunately suffered a cardiac arrest, which was witnessed by his wife and EMS was called. Family initiated CPR. EMS arrived and AED was placed advising shock. 2 shocks were delivered and ROSC was achieved. 2 rounds epinephrine also administerred. EKG in the field was concerning for STEMI and code STEMI was called. In ED patient was unresponsive, he was intubated and taken emergently to cardiac cath lab where he remains at this time. Occlusive LAD lesion identified and intervened upon. PCCM consulted for ICU care. Total downtime estimated at less than 15 mins.   SUBJECTIVE: remains at goal temp  VITAL SIGNS: BP 105/69 mmHg  Pulse 57  Temp(Src) 91.4 F (33 C) (Core (Comment))  Resp 20  Ht 5\' 7"  (1.702 m)  Wt 84.5 kg (186 lb 4.6 oz)  BMI 29.17 kg/m2  SpO2 100%  HEMODYNAMICS: CVP:  [0 mmHg-7 mmHg] 4 mmHg  VENTILATOR SETTINGS: Vent Mode:  [-] PRVC FiO2 (%):  [40 %-100 %] 40 % Set Rate:  [20 bmp] 20 bmp Vt Set:  [550 mL] 550 mL PEEP:  [5 cmH20] 5 cmH20 Plateau Pressure:  [16 cmH20-20 cmH20] 19 cmH20  INTAKE / OUTPUT: I/O last 3 completed shifts: In: 874.8 [I.V.:384.8; NG/GT:130; IV Piggyback:360] Out: 1410 [id number]  PHYSICAL EXAMINATION: General:  Male of normal body habitus in NAD Neuro:  Deep rass, paralyzed HEENT:  perrl sluggish Cardiovascular:  RRR, no MRG Lungs:  coarse Abdomen:  Soft, non-tender, non-distended Musculoskeletal:  No  edema Skin:  Grossly intact  LABS:  CBC  Recent Labs Lab 11/12/15 0015  11/12/15 0259 11/12/15 0400 11/12/15 0804  WBC 16.9*  --   --  18.9* 28.0*  HGB 15.7  < > 15.0 15.7 15.8  HCT 43.9  < > 44.0 44.2 44.4  PLT 195  --   --  171 233  < > = values in this interval not displayed. Coag's  Recent Labs Lab 11/12/15 0015 11/12/15 0932  APTT 75* 24  INR 1.19 1.11   BMET  Recent Labs Lab 11/12/15 0400 11/12/15 0804 11/12/15 0932  NA 137 135 134*  K 3.6 4.6 3.6  CL 107 106 105  CO2 19* 19* 18*  BUN 15 16 15   CREATININE 0.85 0.89 1.00  GLUCOSE 263* 292* 327*   Electrolytes  Recent Labs Lab 11/12/15 0400 11/12/15 0804 11/12/15 0932  CALCIUM 8.1* 8.2* 8.0*  MG 1.8 3.0*  --   PHOS 1.7* 1.9*  --    Sepsis Markers No results for input(s): LATICACIDVEN, PROCALCITON, O2SATVEN in the last 168 hours. ABG  Recent Labs Lab 11/11/15 2252 11/12/15 0044 11/12/15 0330  PHART 7.369 7.397 7.391  PCO2ART 34.3* 38.2 31.2*  PO2ART 93.0 212* 164*   Liver Enzymes  Recent Labs Lab 11/11/15 2137  AST 267*  ALT 225*  ALKPHOS 72  BILITOT 0.8  ALBUMIN 3.6   Cardiac Enzymes  Recent Labs Lab 11/12/15 0015 11/12/15 0804 11/12/15 1138  TROPONINI 42.25*  26.88* 12.23*   Glucose  Recent Labs Lab 11/12/15 0625 11/12/15 0730 11/12/15 0831 11/12/15 0930 11/12/15 1036 11/12/15 1137  GLUCAP 297* 299* 309* 300* 298* 291*    Imaging Ct Head Wo Contrast  11/11/2015  CLINICAL DATA:  69 year old male with cardiac arrest. EXAM: CT HEAD WITHOUT CONTRAST TECHNIQUE: Contiguous axial images were obtained from the base of the skull through the vertex without intravenous contrast. COMPARISON:  Head CT dated 03/29/2015 FINDINGS: There is slight prominence of the ventricles and sulci compatible with age-related volume loss. Mild periventricular and deep white matter hypodensities represent chronic microvascular ischemic changes. There is no intracranial hemorrhage. No mass  effect or midline shift identified. The visualized paranasal sinuses and mastoid air cells are well aerated. The calvarium is intact. IMPRESSION: No acute intracranial pathology. Mild age-related atrophy and chronic microvascular ischemic disease. If symptoms persist and there are no contraindications, MRI may provide better evaluation if clinically indicated Electronically Signed   By: Anner Crete M.D.   On: 11/11/2015 23:44   Dg Chest Port 1 View  11/12/2015  CLINICAL DATA:  Central line placement EXAM: PORTABLE CHEST 1 VIEW COMPARISON:  11/11/2015 FINDINGS: The endotracheal tube is 4.7 cm above the carina. Nasogastric tube extends into the stomach. There is a new left jugular central line with tip in the low SVC. There is no pneumothorax. Ground-glass opacities are present in the central and basilar regions bilaterally, worsened. This could represent alveolar edema or infectious infiltrate. No large effusions. The left hemi thorax is superimposed by a percutaneous pacing patch. IMPRESSION: 1. New left jugular central line with tip in the low SVC. No pneumothorax. 2. Satisfactorily positioned endotracheal tube and nasogastric tube. 3. New/worsening central alveolar edema or infiltrate. Electronically Signed   By: Andreas Newport M.D.   On: 11/12/2015 06:00   Dg Chest Portable 1 View  11/11/2015  CLINICAL DATA:  Myocardial infarction.  Post resuscitation. EXAM: PORTABLE CHEST 1 VIEW COMPARISON:  03/29/2015 FINDINGS: Endotracheal tube tip is 2.6 cm above the carina. The nasogastric tube extends into the stomach. The lungs are clear except for mild atelectatic appearing linear basilar opacities on the left. No confluent airspace consolidation. No large effusion. No pneumothorax. Mild central vascular prominence, which can be physiologic in the supine position. IMPRESSION: Support equipment appears satisfactorily positioned. Electronically Signed   By: Andreas Newport M.D.   On: 11/11/2015 22:12      STUDIES:  LHC 11/29 >>> stent lad 11/29 ct head>>>nAD eeg 11/30 suppression  CULTURES: n/a  ANTIBIOTICS: n/a  SIGNIFICANT EVENTS: 11/29 cardiac arrest > cath lab, out to ICU, therapeutic hypothermia.   LINES/TUBES: ETT 11/29 >>> CVL 11/29 >>>   ASSESSMENT / PLAN:  PULMONARY A: Acute respiratory failure secondary to cardiac arrest, pulm edema  P:   abg reviewed, keep same MV, when we rewarm we produce 30% more co2, may need higher mV No sbt Min O 2 support, abg in am  pcxr in am , likely to need lasix  CARDIOVASCULAR A:  Cardiac arrest - VT/VF STEMI s/p PCI 11/29 HTN shock  P:  MAP goal 80-85 with hypothermia, MAP 65 rewarm end Asa, [place] cvp noted 3-4, some edema on pcxr? Levo to this goal, at 30 , attempt bolus small Get cortisol  RENAL A:   CKD Hypokalemia At risk cold diuresis hypophos P:   k supp, limit during rewarm kvo for now, cvp noted supp phos  GASTROINTESTINAL A:   Transaminitis  P:   NPO Pepcid for  SUP Trend LFT Feed in am   HEMATOLOGIC A:   dEmargination, leukocytosis  P:  Follow CBC Antiplatelet/anticoagulation per cardiology Sub q heparin  INFECTIOUS A:   Leukocytosis Higher risk for infection from cooling  P:   Wbc noted Low threshold addition levolfoxacin given risk Sputum if secretions noted Need UA  ENDOCRINE A:   Hyperglycemia without history of DM    R/o rel AI P:   CBG monitoring and SSI  Get cortisol tsh  NEUROLOGIC A:   Acute anoxic brain injury  P:   RASS goal: -4/-5 prior to NMB administration Fentanyl gtt for analgesia Versed gtt for deep sedation Nimbex to avoid shivering until rewarm completed   FAMILY  - Updates: no family here  - Inter-disciplinary family meet or Palliative Care meeting due by:  12/7   Ccm time 40 min  Lavon Paganini. Titus Mould, MD, [place] Pgr: [facility]

## 2015-11-12 NOTE — Progress Notes (Signed)
EEG completed, results pending. 

## 2015-11-12 NOTE — Progress Notes (Signed)
[place] Progress Note Patient Name: [name] DOB: 1946/01/27 MRN: [id number]   Date of Service  11/12/2015  HPI/Events of Note  Hypokalemia  eICU Interventions  Potassium replaced     Intervention Category Intermediate Interventions: Electrolyte abnormality - evaluation and management  Carles Florea 11/12/2015, 3:23 AM

## 2015-11-12 NOTE — Progress Notes (Signed)
[place] Progress Note Patient Name: [name] DOB: 1946/09/07 MRN: [id number]   Date of Service  11/12/2015  HPI/Events of Note  Hypophosphatemia and moderate low mag  eICU Interventions  Phos and mag replaced     Intervention Category Intermediate Interventions: Electrolyte abnormality - evaluation and management  DETERDING,ELIZABETH 11/12/2015, 4:54 AM

## 2015-11-12 NOTE — Progress Notes (Signed)
[place] Progress Note Patient Name: [name] DOB: 1946-03-25 MRN: [id number]   Date of Service  11/12/2015  HPI/Events of Note  K+ = 3.3 and Creatinine = 1.08.  eICU Interventions  Will replete K+.     Intervention Category Intermediate Interventions: Electrolyte abnormality - evaluation and management  Jerimy Johanson Eugene 11/12/2015, 4:08 PM

## 2015-11-12 NOTE — Procedures (Signed)
ELECTROENCEPHALOGRAM REPORT   Patient: [name]        Age: 69 y.o.        Sex: male Referring Physician: Dr Burt Knack Report Date:  11/12/2015        Interpreting Physician: Hulen Luster  History: [name] is an 69 y.o. male admitted for cardiac arrest, undergoing hypothermia protocol  Medications:  Sedated on versed, nimbex and fentanyl  Conditions of Recording:  This is a 16 channel EEG carried out with the patient in the intubated and sedated state  Description:  The recording consists of a profoundly suppressed background rhythm with no noted posterior dominant alpha activity. No focal slowing or epileptiform activity noted. The patient drowses with slowing to irregular, low voltage theta and beta activity. No EEG reactivity noted to noxious stimuli (RN suctioning).     Hyperventilation was not performed. Intermittent photic stimulation was not performed.  IMPRESSION: Abnormal EEG due to profound suppression of the background rhythm. This is likely a medication effect. Further prognostic value limited at this time as patient currently undergoing hypothermia protocol.    Jim Like, DO [facility] [phone]  If 7pm- 7am, please page neurology on call as listed in [place]. 11/12/2015, 11:43 AM

## 2015-11-12 NOTE — Progress Notes (Signed)
Pt wedding ring taken off and put in cup with necklace and gold ring. At patients bedside. No family available  to return belongings at this time. Will report to dayshift RN.

## 2015-11-12 NOTE — Progress Notes (Signed)
Subjective:  Sedated this AM.   Objective:  Vital Signs in the last 24 hours: Temp:  [90.5 F (32.5 C)-99.1 F (37.3 C)] 91.4 F (33 C) (11/30 0645) Pulse Rate:  [0-275] 58 (11/30 0700) Resp:  [0-44] 20 (11/30 0700) BP: (89-134)/(68-89) 121/80 mmHg (11/30 0130) SpO2:  [0 %-100 %] 100 % (11/30 0700) Arterial Line BP: (92-163)/(53-77) 115/57 mmHg (11/30 0700) FiO2 (%):  [40 %-100 %] 40 % (11/30 0323) Weight:  [186 lb 4.6 oz (84.5 kg)-187 lb 6.3 oz (85 kg)] 186 lb 4.6 oz (84.5 kg) (11/30 0015)  Intake/Output from previous day: 11/29 0701 - 11/30 0700 In: 787.6 [I.V.:297.6; NG/GT:130; IV Piggyback:360] Out: 1410 [Urine:1410]  Physical Exam: General: overweight elderly Caucasian male, sedated HEENT: constricted pupils, ETT in place, L IJ  Cardiac: RRR, no rubs, murmurs or gallops Pulm: clear to auscultation bilaterally in the anterior lung fields, no wheezes, rales, or rhonchi Abd: soft, nontender, nondistended, BS present Ext: cool to touch, 2+ pulses in all four extremities, R a-line, R arm with hematoma Neuro: unresponsive to pain   Lab Results:  Recent Labs  11/12/15 0015 11/12/15 0100 11/12/15 0400  WBC 16.9*  --  18.9*  HGB 15.7 16.0 15.7  PLT 195  --  171    Recent Labs  11/12/15 0015 11/12/15 0100 11/12/15 0400  NA 138 138 137  K 3.4* 3.5 3.6  CL 105 101 107  CO2 23  --  19*  GLUCOSE 249* 252* 263*  BUN 16 19 15   CREATININE 1.16 1.00 0.85    Recent Labs  11/12/15 0015  TROPONINI 42.25*    Cardiac Studies: Procedures    Coronary Stent Intervention   Left Heart Cath and Cors/Grafts Angiography    Conclusion    1. Total occlusion of the proximal LAD, treated successfully with primary PCI (drug-eluting stent) 2. Nonobstructive LCx and RCA stenoses 3. Severe segmental LV systolic dysfunction, LVEF 25%   Tele: Ocassional PVCs  Assessment/Plan:  [name] is a 69 year old male with rheumatoid arthritis, hypertension hospitalized  for PEA arrest in setting of anterior STEMI now s/p DES to the proximal LAD found to have ischemic cardiomyopathy, abnormal LFTs, acute encephalopathy, and hyperglycemia. Overall prognosis guarded.  PEA arrest 2/2 anterior STEMI now s/p DES to the proximal LAD: ROSC achieved prior to arrival. EKG this AM with ST changes in III, avF and T-wave inversions in V4-V6. Troponin 26.88 this AM, down from 42.25 on admission.  -Continue ASA, Brillinta -Follow-up last troponin -Continue assessing R arm hematoma to monitor for signs of compartment syndrome -Defer B-blocker given that he is on pressor support right now in the setting of cooling protocol -PCCM following, appreciate recommendations  Ischemic cardiomyopathy: EF 25% as noted above likely in the setting of recent MI. -Defer repeat echo once he is off pressors  Hyperglycemia: CBGs 200s since admission. -A1c pending -Continue insulin gtt  Acute encephalopathy: Secondary to PEA arrest as noted above and now on sedation for cooling protocol. Head CT without acute findings on admission.  -EEG pending  Abnormal LFTs: AST/ALT in 200s on admission presumably from shock. -Recheck CMET today -Defer statin therapy for now   [name], PGY2 Internal Medicine Pager: [phone]  11/12/2015, 7:22 AM   I have seen, examined and evaluated the patient this AM along with Dr. Posey Pronto on AM rounds.  After reviewing all the available data and chart,  I agree with his findings, examination as well as impression recommendations.  Large Anterior  STEMI with out of hospital witnessed arrest. -- Interestingly, Troponin levels are not as high as would be expected given Anterior A-kinesis & severely reduced LVEF by LVGram & shock.  Could potentially expect some recovery of LV function  Active Problems:   ST elevation (STEMI) myocardial infarction involving left anterior descending coronary artery [facility])   Cardiac arrest with ventricular fibrillation ([name])    Acute respiratory failure with hypoxia ([name])   Shock circulatory ([name])   Coma ([name])   CAD S/P PCI OF pLAD with 3.5 mm x38 mm Promus DES (3.75 mm)   Cardiomyopathy, ischemic - EF 20-25% by LV Gram   Rheumatoid arthritis ([place])  Remains on hypothermia protocol & on pressors to maintain MAP > 80. TR Band now ready to come off - still has swelling on R forearm, but stable --continue ACE-Wrap -- warm compresses in AM. On ASA  & Brilinta No statin for now with Shock Liver Monitor renal Fxn - making urine.  Hold off on Echo until off pressors & no longer on [place].  For now - watchful waiting until re-warming  & awakening process.  EEG this AM.    Leonie Man, M.D., M.S. Interventional Cardiologist   Pager # [phone]

## 2015-11-12 NOTE — Procedures (Signed)
Central Venous Catheter Insertion Procedure Note EBAN BONKOWSKI [id number] Dec 15, 1945  Procedure: Insertion of Central Venous Catheter Indications: Assessment of intravascular volume, Drug and/or fluid administration and Frequent blood sampling  Procedure Details Consent: Risks of procedure as well as the alternatives and risks of each were explained to the (patient/caregiver).  Consent for procedure obtained. Time Out: Verified patient identification, verified procedure, site/side was marked, verified correct patient position, special equipment/implants available, medications/allergies/relevent history reviewed, required imaging and test results available.  Performed  Maximum sterile technique was used including antiseptics, cap, gloves, gown, hand hygiene, mask and sheet. Skin prep: Chlorhexidine; local anesthetic administered A antimicrobial bonded/coated triple lumen catheter was placed in the left internal jugular vein using the Seldinger technique. Ultrasound guidance used.Yes.   Catheter placed to 20 cm. Blood aspirated via all 3 ports and then flushed x 3. Line sutured x 2 and dressing applied.  Evaluation Blood flow good Complications: No apparent complications Patient did tolerate procedure well. Chest X-ray ordered to verify placement.  CXR: pending.  Georgann Housekeeper, AGACNP-BC [facility] Pulmonology/Critical Care Pager [phone] or [phone]  11/12/2015 5:06 AM

## 2015-11-12 NOTE — Care Management Note (Signed)
Case Management Note  Patient Details  Name: [name] MRN: [id number] Date of Birth: 28-Dec-1945  Subjective/Objective:     Pt adm w stemi, vent               Action/Plan: lives w wife, pcp dr Cordelia Pen   Expected Discharge Date:                  Expected Discharge Plan:     In-House Referral:     Discharge planning Services  CM Consult, Medication Assistance  Post Acute Care Choice:    Choice offered to:     DME Arranged:    DME Agency:     HH Arranged:    [place] Agency:     Status of Service:     Medicare Important Message Given:    Date Medicare IM Given:    Medicare IM give by:    Date Additional Medicare IM Given:    Additional Medicare Important Message give by:     If discussed at [place] of Stay Meetings, dates discussed:    Additional Comments: ur review done, left pt 30day free brilinta card.  Lacretia Leigh, RN 11/12/2015, 10:04 AM

## 2015-11-13 ENCOUNTER — Inpatient Hospital Stay (HOSPITAL_COMMUNITY): Payer: BLUE CROSS/BLUE SHIELD

## 2015-11-13 DIAGNOSIS — I2510 Atherosclerotic heart disease of native coronary artery without angina pectoris: Secondary | ICD-10-CM

## 2015-11-13 DIAGNOSIS — I255 Ischemic cardiomyopathy: Secondary | ICD-10-CM

## 2015-11-13 DIAGNOSIS — Z9861 Coronary angioplasty status: Secondary | ICD-10-CM

## 2015-11-13 DIAGNOSIS — I4901 Ventricular fibrillation: Secondary | ICD-10-CM

## 2015-11-13 LAB — BLOOD GAS, ARTERIAL
Acid-base deficit: 7.5 mmol/L — ABNORMAL HIGH (ref 0.0–2.0)
BICARBONATE: 16.9 meq/L — AB (ref 20.0–24.0)
DRAWN BY: 356271
FIO2: 0.4
O2 Saturation: 96.9 %
PEEP: 5 cmH2O
PH ART: 7.416 (ref 7.350–7.450)
Patient temperature: 91.4
RATE: 20 resp/min
TCO2: 17.9 mmol/L (ref 0–100)
VT: 550 mL
pCO2 arterial: 25.4 mmHg — ABNORMAL LOW (ref 35.0–45.0)
pO2, Arterial: 70.7 mmHg — ABNORMAL LOW (ref 80.0–100.0)

## 2015-11-13 LAB — COMPREHENSIVE METABOLIC PANEL
ALK PHOS: 62 U/L (ref 38–126)
ALT: 158 U/L — AB (ref 17–63)
AST: 118 U/L — AB (ref 15–41)
Albumin: 3.2 g/dL — ABNORMAL LOW (ref 3.5–5.0)
Anion gap: 8 (ref 5–15)
BUN: 19 mg/dL (ref 6–20)
CALCIUM: 8.4 mg/dL — AB (ref 8.9–10.3)
CHLORIDE: 110 mmol/L (ref 101–111)
CO2: 18 mmol/L — AB (ref 22–32)
CREATININE: 0.96 mg/dL (ref 0.61–1.24)
Glucose, Bld: 172 mg/dL — ABNORMAL HIGH (ref 65–99)
Potassium: 4.2 mmol/L (ref 3.5–5.1)
Sodium: 136 mmol/L (ref 135–145)
Total Bilirubin: 0.7 mg/dL (ref 0.3–1.2)
Total Protein: 5.6 g/dL — ABNORMAL LOW (ref 6.5–8.1)

## 2015-11-13 LAB — CBC WITH DIFFERENTIAL/PLATELET
BASOS PCT: 0 %
Basophils Absolute: 0 10*3/uL (ref 0.0–0.1)
EOS ABS: 0 10*3/uL (ref 0.0–0.7)
EOS PCT: 0 %
HCT: 43.5 % (ref 39.0–52.0)
Hemoglobin: 16 g/dL (ref 13.0–17.0)
LYMPHS ABS: 1.3 10*3/uL (ref 0.7–4.0)
Lymphocytes Relative: 4 %
MCH: 32.2 pg (ref 26.0–34.0)
MCHC: 36.8 g/dL — AB (ref 30.0–36.0)
MCV: 87.5 fL (ref 78.0–100.0)
MONOS PCT: 7 %
Monocytes Absolute: 1.9 10*3/uL — ABNORMAL HIGH (ref 0.1–1.0)
NEUTROS PCT: 89 %
Neutro Abs: 25.4 10*3/uL — ABNORMAL HIGH (ref 1.7–7.7)
PLATELETS: 238 10*3/uL (ref 150–400)
RBC: 4.97 MIL/uL (ref 4.22–5.81)
RDW: 13.7 % (ref 11.5–15.5)
WBC: 28.6 10*3/uL — AB (ref 4.0–10.5)

## 2015-11-13 LAB — BASIC METABOLIC PANEL
ANION GAP: 8 (ref 5–15)
Anion gap: 6 (ref 5–15)
BUN: 18 mg/dL (ref 6–20)
BUN: 19 mg/dL (ref 6–20)
CHLORIDE: 108 mmol/L (ref 101–111)
CHLORIDE: 111 mmol/L (ref 101–111)
CO2: 18 mmol/L — AB (ref 22–32)
CO2: 19 mmol/L — AB (ref 22–32)
Calcium: 8.2 mg/dL — ABNORMAL LOW (ref 8.9–10.3)
Calcium: 8.4 mg/dL — ABNORMAL LOW (ref 8.9–10.3)
Creatinine, Ser: 0.97 mg/dL (ref 0.61–1.24)
Creatinine, Ser: 1.02 mg/dL (ref 0.61–1.24)
GFR calc Af Amer: >60 mL/min (ref >60)
GFR calc Af Amer: >60 mL/min (ref >60)
GFR calc non Af Amer: >60 mL/min (ref >60)
GLUCOSE: 209 mg/dL — AB (ref 65–99)
Glucose, Bld: 142 mg/dL — ABNORMAL HIGH (ref 65–99)
POTASSIUM: 4.2 mmol/L (ref 3.5–5.1)
POTASSIUM: 4.4 mmol/L (ref 3.5–5.1)
SODIUM: 136 mmol/L (ref 135–145)
Sodium: 134 mmol/L — ABNORMAL LOW (ref 135–145)

## 2015-11-13 LAB — POCT I-STAT 3, ART BLOOD GAS (G3+)
ACID-BASE DEFICIT: 6 mmol/L — AB (ref 0.0–2.0)
Bicarbonate: 19 mEq/L — ABNORMAL LOW (ref 20.0–24.0)
O2 SAT: 96 %
PCO2 ART: 38.2 mmHg (ref 35.0–45.0)
PH ART: 7.309 — AB (ref 7.350–7.450)
PO2 ART: 95 mmHg (ref 80.0–100.0)
Patient temperature: 37.7
TCO2: 20 mmol/L (ref 0–100)

## 2015-11-13 LAB — POCT I-STAT, CHEM 8
BUN: 20 mg/dL (ref 6–20)
BUN: 20 mg/dL (ref 6–20)
CALCIUM ION: 1.18 mmol/L (ref 1.13–1.30)
CHLORIDE: 107 mmol/L (ref 101–111)
CREATININE: 0.8 mg/dL (ref 0.61–1.24)
Calcium, Ion: 1.18 mmol/L (ref 1.13–1.30)
Chloride: 108 mmol/L (ref 101–111)
Creatinine, Ser: 0.9 mg/dL (ref 0.61–1.24)
GLUCOSE: 173 mg/dL — AB (ref 65–99)
Glucose, Bld: 153 mg/dL — ABNORMAL HIGH (ref 65–99)
HCT: 44 % (ref 39.0–52.0)
HCT: 42 % (ref 39.0–52.0)
HEMOGLOBIN: 15 g/dL (ref 13.0–17.0)
HEMOGLOBIN: 14.3 g/dL (ref 13.0–17.0)
POTASSIUM: 4.4 mmol/L (ref 3.5–5.1)
POTASSIUM: 4.9 mmol/L (ref 3.5–5.1)
Sodium: 138 mmol/L (ref 135–145)
Sodium: 138 mmol/L (ref 135–145)
TCO2: 18 mmol/L (ref 0–100)
TCO2: 21 mmol/L (ref 0–100)

## 2015-11-13 LAB — GLUCOSE, CAPILLARY
GLUCOSE-CAPILLARY: 189 mg/dL — AB (ref 65–99)
GLUCOSE-CAPILLARY: 109 mg/dL — AB (ref 65–99)
Glucose-Capillary: 130 mg/dL — ABNORMAL HIGH (ref 65–99)
Glucose-Capillary: 206 mg/dL — ABNORMAL HIGH (ref 65–99)
Glucose-Capillary: 161 mg/dL — ABNORMAL HIGH (ref 65–99)
Glucose-Capillary: 157 mg/dL — ABNORMAL HIGH (ref 65–99)

## 2015-11-13 LAB — LACTIC ACID, PLASMA: LACTIC ACID, VENOUS: 1.5 mmol/L (ref 0.5–2.0)

## 2015-11-13 LAB — HEMOGLOBIN A1C
Hgb A1c MFr Bld: 6.6 % — ABNORMAL HIGH (ref 4.8–5.6)
Mean Plasma Glucose: 143 mg/dL

## 2015-11-13 LAB — PHOSPHORUS: PHOSPHORUS: 2.2 mg/dL — AB (ref 2.5–4.6)

## 2015-11-13 LAB — MAGNESIUM: Magnesium: 2.3 mg/dL (ref 1.7–2.4)

## 2015-11-13 LAB — PROCALCITONIN: PROCALCITONIN: 0.2 ng/mL

## 2015-11-13 MED ORDER — HYDROCORTISONE NA SUCCINATE PF 100 MG IJ SOLR
50.0000 mg | Freq: Four times a day (QID) | INTRAMUSCULAR | Status: DC
  Administered 2015-11-13 – 2015-11-15 (×10): 50 mg via INTRAVENOUS
  Filled 2015-11-13 (×11): qty 2

## 2015-11-13 MED ORDER — SODIUM CHLORIDE 0.9 % IJ SOLN
10.0000 mL | INTRAMUSCULAR | Status: DC | PRN
  Administered 2015-11-25 – 2015-11-29 (×2): 10 mL
  Filled 2015-11-13 (×2): qty 40

## 2015-11-13 MED ORDER — VITAL HIGH PROTEIN PO LIQD
1000.0000 mL | ORAL | Status: DC
  Filled 2015-11-13 (×2): qty 1000

## 2015-11-13 MED ORDER — VITAL AF 1.2 CAL PO LIQD
1000.0000 mL | ORAL | Status: DC
  Administered 2015-11-13 – 2015-11-20 (×7): 1000 mL
  Filled 2015-11-13 (×13): qty 1000

## 2015-11-13 MED ORDER — SODIUM CHLORIDE 0.9 % IV SOLN
25.0000 ug/h | INTRAVENOUS | Status: DC
  Administered 2015-11-13: 200 ug/h via INTRAVENOUS
  Administered 2015-11-14: 250 ug/h via INTRAVENOUS
  Administered 2015-11-15: 25 ug/h via INTRAVENOUS
  Administered 2015-11-16: 200 ug/h via INTRAVENOUS
  Administered 2015-11-16: 225 ug/h via INTRAVENOUS
  Administered 2015-11-16: 200 ug/h via INTRAVENOUS
  Administered 2015-11-17: 300 ug/h via INTRAVENOUS
  Administered 2015-11-18: 175 ug/h via INTRAVENOUS
  Administered 2015-11-19: 125 ug/h via INTRAVENOUS
  Administered 2015-11-20: 300 ug/h via INTRAVENOUS
  Filled 2015-11-13 (×11): qty 50

## 2015-11-13 MED ORDER — SODIUM CHLORIDE 0.9 % IV SOLN
INTRAVENOUS | Status: DC
Start: 2015-11-13 — End: 2015-11-14
  Administered 2015-11-13: 75 mL/h via INTRAVENOUS
  Administered 2015-11-14: 04:00:00 via INTRAVENOUS

## 2015-11-13 MED ORDER — INSULIN ASPART 100 UNIT/ML ~~LOC~~ SOLN
3.0000 [IU] | SUBCUTANEOUS | Status: DC
  Administered 2015-11-13 – 2015-11-25 (×62): 3 [IU] via SUBCUTANEOUS

## 2015-11-13 MED ORDER — SODIUM CHLORIDE 0.9 % IJ SOLN
10.0000 mL | Freq: Two times a day (BID) | INTRAMUSCULAR | Status: DC
  Administered 2015-11-13 – 2015-11-17 (×9): 10 mL
  Administered 2015-11-18: 30 mL
  Administered 2015-11-19 – 2015-11-20 (×3): 10 mL
  Administered 2015-11-20: 30 mL
  Administered 2015-11-21 (×2): 10 mL

## 2015-11-13 MED ORDER — ATORVASTATIN CALCIUM 80 MG PO TABS
80.0000 mg | ORAL_TABLET | Freq: Every day | ORAL | Status: DC
  Administered 2015-11-13 – 2015-12-03 (×19): 80 mg via ORAL
  Filled 2015-11-13 (×23): qty 1

## 2015-11-13 MED ORDER — INSULIN ASPART 100 UNIT/ML ~~LOC~~ SOLN
0.0000 [IU] | SUBCUTANEOUS | Status: DC
  Administered 2015-11-13 – 2015-11-14 (×3): 4 [IU] via SUBCUTANEOUS
  Administered 2015-11-14: 3 [IU] via SUBCUTANEOUS
  Administered 2015-11-14 – 2015-11-15 (×3): 4 [IU] via SUBCUTANEOUS
  Administered 2015-11-15 – 2015-11-17 (×8): 3 [IU] via SUBCUTANEOUS
  Administered 2015-11-17 (×2): 4 [IU] via SUBCUTANEOUS
  Administered 2015-11-17: 3 [IU] via SUBCUTANEOUS
  Administered 2015-11-17: 4 [IU] via SUBCUTANEOUS
  Administered 2015-11-17 – 2015-11-18 (×3): 3 [IU] via SUBCUTANEOUS
  Administered 2015-11-18 (×2): 4 [IU] via SUBCUTANEOUS
  Administered 2015-11-18 – 2015-11-19 (×6): 3 [IU] via SUBCUTANEOUS
  Administered 2015-11-20: 4 [IU] via SUBCUTANEOUS
  Administered 2015-11-20 (×3): 3 [IU] via SUBCUTANEOUS
  Administered 2015-11-21: 4 [IU] via SUBCUTANEOUS
  Administered 2015-11-21: 3 [IU] via SUBCUTANEOUS
  Administered 2015-11-21 – 2015-11-22 (×3): 4 [IU] via SUBCUTANEOUS
  Administered 2015-11-22: 7 [IU] via SUBCUTANEOUS
  Administered 2015-11-22: 4 [IU] via SUBCUTANEOUS
  Administered 2015-11-22: 7 [IU] via SUBCUTANEOUS
  Administered 2015-11-22 – 2015-11-23 (×3): 4 [IU] via SUBCUTANEOUS
  Administered 2015-11-23: 7 [IU] via SUBCUTANEOUS
  Administered 2015-11-23 (×2): 4 [IU] via SUBCUTANEOUS
  Administered 2015-11-23: 7 [IU] via SUBCUTANEOUS
  Administered 2015-11-23: 11 [IU] via SUBCUTANEOUS
  Administered 2015-11-24 (×3): 4 [IU] via SUBCUTANEOUS
  Administered 2015-11-24: 7 [IU] via SUBCUTANEOUS
  Administered 2015-11-25 (×3): 3 [IU] via SUBCUTANEOUS

## 2015-11-13 MED ORDER — SODIUM CHLORIDE 0.9 % IV BOLUS (SEPSIS)
500.0000 mL | Freq: Once | INTRAVENOUS | Status: AC
  Administered 2015-11-13: 500 mL via INTRAVENOUS

## 2015-11-13 MED ORDER — INSULIN GLARGINE 100 UNIT/ML ~~LOC~~ SOLN
20.0000 [IU] | Freq: Every day | SUBCUTANEOUS | Status: DC
  Administered 2015-11-13: 20 [IU] via SUBCUTANEOUS
  Filled 2015-11-13 (×2): qty 0.2

## 2015-11-13 MED ORDER — INSULIN ASPART 100 UNIT/ML ~~LOC~~ SOLN
2.0000 [IU] | SUBCUTANEOUS | Status: DC
  Administered 2015-11-13: 6 [IU] via SUBCUTANEOUS
  Administered 2015-11-13: 4 [IU] via SUBCUTANEOUS

## 2015-11-13 MED ORDER — PRO-STAT SUGAR FREE PO LIQD
30.0000 mL | Freq: Two times a day (BID) | ORAL | Status: DC
  Administered 2015-11-13 – 2015-11-21 (×16): 30 mL
  Filled 2015-11-13 (×16): qty 30

## 2015-11-13 NOTE — Clinical Documentation Improvement (Signed)
Critical Care  Would you please clarify clinical indicators for diagnosis of CKD noted in progress notes and if validated then need stage of CKD?   CKD Stage I - GFR greater than or equal to 90  CKD Stage II - GFR 60-89  CKD Stage III - GFR 30-59  CKD Stage IV - GFR 15-29  CKD Stage V - GFR < 15  ESRD (End Stage Renal Disease)  Other condition  Unable to clinically determine   Supporting Information: : (risk factors, signs and symptoms, diagnostics, treatment) BUN 16 on admission, 15, 16, 15, 18, 18, 19; Creatinine on admission 1.16, 085, 089, 1.00, 1.08, 1.02, 1.02, .97; and GFR >60  Please exercise your independent, professional judgment when responding. A specific answer is not anticipated or expected.   Thank You, [place] [phone]

## 2015-11-13 NOTE — Progress Notes (Signed)
[name] Progress Note Patient Name: [name] DOB: 1946-02-09 MRN: [id number]   Date of Service  11/13/2015  HPI/Events of Note  Agitation. Patient is coming off Hypothermia sedation and analgesia orders. Needs new orders for sedation.  eICU Interventions  Will order: 1. Fentanyl IV infusion 25- 300 mcg/hour. Titrate to RASS = 0 to -1.     Intervention Category Minor Interventions: Agitation / anxiety - evaluation and management  [name] 11/13/2015, 4:11 PM

## 2015-11-13 NOTE — Progress Notes (Signed)
Initial Nutrition Assessment  DOCUMENTATION CODES:   Not applicable  INTERVENTION:   Vital AF 1.2 goal rate of 54, begin @ 89ml/hr increase by 10 every 4 hours to goal.  Pro Stat 53ml BID, each supplement provides 100 kcal and 15 grams of protein  Together provides: 1750 calories, 125g Pro, 1047cc free h2o   NUTRITION DIAGNOSIS:   Inadequate oral intake related to inability to eat as evidenced by NPO status.   GOAL:   Patient will meet greater than or equal to 90% of their needs   MONITOR:   Diet advancement, Labs, I & O's, Weight trends, Vent status  REASON FOR ASSESSMENT:   Ventilator    ASSESSMENT:   69 y.o. male w/ PMHx significant for rheumatoid arthritis and HTN who [facility]11[facility]2015 after an out-of-hospital cardiac arrest. The patient reportedly had chest and arm pain intermittently for 2 weeks after sustaining a mechanical fall. Today he had worsening of his chest pain and he ultimately arrested at home, witnessed by his wife.  Pt intubated and sedated at bedside. No family present. Unable to obtain diet history.  Nutrition-Focused physical exam completed. Findings are no fat depletion, no muscle depletion, and moderate edema.   Patient is being rewarmed, currently at 34.4C, should reach 37 @ 415pm. Pt will not meet goal rate until early morning tomorrow.  Labs are significant for low phos - 2.2, other electrolytes stable. Low phos likely related to low bicarbonate, not at risk for refeeding at this time.  Pt is sedated on fentanyl and versed, currently on Levophed for pressor support.  Follow for tubefeed tolerance and management  Diet Order:     Skin:  Reviewed, no issues  Last BM:  11/11/2015  Height:   Ht Readings from Last 1 Encounters:  11/11/15 5\' 7"  (1.702 m)    Weight:   Wt Readings from Last 1 Encounters:  11/12/15 186 lb 4.6 oz (84.5 kg)    Ideal Body Weight:  67.27 kg  BMI:  Body mass index is 29.17  kg/(m^2).  Estimated Nutritional Needs:   Kcal:  1750 calories  Protein:  100 - 126 grams  Fluid:  1L  EDUCATION NEEDS:   No education needs identified at this time  Satira Anis. Keiland Pickering, MS, RD LDN After Hours/Weekend Pager [phone]

## 2015-11-13 NOTE — Consult Note (Signed)
PULMONARY / CRITICAL CARE MEDICINE   Name: JAMICHEAL GLUNZ MRN: [id number] DOB: 1946-05-20    ADMISSION DATE:  11/11/2015 CONSULTATION DATE:  11/11/2015  REFERRING MD :  Dr. Burt Knack  CHIEF COMPLAINT:  Cardiac arrest  HISTORY OF PRESENT ILLNESS:  69 year old male with PMH as below, which is significant for RA and HTN. He [facility] ED 11/29 after a witnessed cardiac arrest at home. He has had intermittent chest and arm pain for about 2 weeks status post mechanical fall. 11/29 chest pain acutely worsened and he unfortunately suffered a cardiac arrest, which was witnessed by his wife and EMS was called. Family initiated CPR. EMS arrived and AED was placed advising shock. 2 shocks were delivered and ROSC was achieved. 2 rounds epinephrine also administerred. EKG in the field was concerning for STEMI and code STEMI was called. In ED patient was unresponsive, he was intubated and taken emergently to cardiac cath lab where he remains at this time. Occlusive LAD lesion identified and intervened upon. PCCM consulted for ICU care. Total downtime estimated at less than 15 mins.   SUBJECTIVE: rewarm  VITAL SIGNS: BP 141/61 mmHg  Pulse 81  Temp(Src) 93.9 F (34.4 C) (Core (Comment))  Resp 20  Ht 5\' 7"  (1.702 m)  Wt 84.5 kg (186 lb 4.6 oz)  BMI 29.17 kg/m2  SpO2 99%  HEMODYNAMICS: CVP:  [2 mmHg-6 mmHg] 2 mmHg  VENTILATOR SETTINGS: Vent Mode:  [-] PRVC FiO2 (%):  [40 %] 40 % Set Rate:  [20 bmp] 20 bmp Vt Set:  [550 mL] 550 mL PEEP:  [5 cmH20] 5 cmH20 Plateau Pressure:  [18 cmH20-19 cmH20] 18 cmH20  INTAKE / OUTPUT: I/O last 3 completed shifts: In: 3046 [I.V.:2076; NG/GT:210; IV Piggyback:760] Out: 2105 [Urine:2105]  PHYSICAL EXAMINATION: General:  Male of normal body habitus in NAD Neuro:  Deep rass, paralyzed HEENT:  perrl sluggish Cardiovascular:  s1 s2 RRR, no MRG Lungs:  coarse Abdomen:  Soft, non-tender, non-distended Musculoskeletal:  No edema Skin:  Grossly  intact  LABS:  CBC  Recent Labs Lab 11/12/15 0400 11/12/15 0804 11/13/15 0500  WBC 18.9* 28.0* 28.6*  HGB 15.7 15.8 16.0  HCT 44.2 44.4 43.5  PLT 171 233 238   Coag's  Recent Labs Lab 11/12/15 0015 11/12/15 0932  APTT 75* 24  INR 1.19 1.11   BMET  Recent Labs Lab 11/12/15 2352 11/13/15 0500 11/13/15 0600  NA 136 136 134*  K 4.4 4.2 4.2  CL 111 110 108  CO2 19* 18* 18*  BUN 18 19 19   CREATININE 1.02 0.96 0.97  GLUCOSE 142* 172* 209*   Electrolytes  Recent Labs Lab 11/12/15 0400 11/12/15 0804  11/12/15 2352 11/13/15 0500 11/13/15 0600  CALCIUM 8.1* 8.2*  < > 8.4* 8.4* 8.2*  MG 1.8 3.0*  --   --  2.3  --   PHOS 1.7* 1.9*  --   --  2.2*  --   < > = values in this interval not displayed. Sepsis Markers  Recent Labs Lab 11/12/15 1300 11/13/15 11/13/15 0500  LATICACIDVEN  --  1.5  --   PROCALCITON 0.22  --  0.20   ABG  Recent Labs Lab 11/12/15 0044 11/12/15 0330 11/13/15 0305  PHART 7.397 7.391 7.416  PCO2ART 38.2 31.2* 25.4*  PO2ART 212* 164* 70.7*   Liver Enzymes  Recent Labs Lab 11/11/15 2137 11/12/15 0932 11/13/15 0500  AST 267* 250* 118*  ALT 225* 207* 158*  ALKPHOS 72 67  62  BILITOT 0.8 1.1 0.7  ALBUMIN 3.6 3.6 3.2*   Cardiac Enzymes  Recent Labs Lab 11/12/15 0804 11/12/15 1138 11/12/15 1800  TROPONINI 26.88* 12.23* 8.53*   Glucose  Recent Labs Lab 11/12/15 2101 11/12/15 2201 11/12/15 2300 11/13/15 0002 11/13/15 0420 11/13/15 0837  GLUCAP 132* 133* 132* 130* 189* 206*    Imaging Dg Chest Port 1 View  11/13/2015  CLINICAL DATA:  Cardiac arrest, acute respiratory failure, CHF. EXAM: PORTABLE CHEST 1 VIEW COMPARISON:  Portable chest x-ray of November 12, 2015 FINDINGS: The lungs are adequately inflated. The retrocardiac region remains dense on the left. The interstitial markings at the right lung base are slightly more conspicuous today. There is no pneumothorax nor large pleural effusion. The heart is  top-normal in size. The pulmonary vascularity is not clearly engorged. The endotracheal tube tip lies approximately 2.5 cm above the carina. The esophagogastric tube tip projects below the inferior margin of the image. The left internal jugular venous catheter tip projects over the midportion of the SVC. External pacemaker defibrillator pads are present. IMPRESSION: Increased interstitial density in both lung bases consistent with infiltrate or pulmonary edema. The upper lobe pulmonary vascularity is only minimally prominent. Stable support tubes. Electronically Signed   By: David  Martinique M.D.   On: 11/13/2015 07:14     STUDIES:  LHC 11/29 >>> stent lad 11/29 ct head>>>nAD eeg 11/30 suppression  CULTURES: n/a  ANTIBIOTICS: n/a  SIGNIFICANT EVENTS: 11/29 cardiac arrest > cath lab, out to ICU, therapeutic hypothermia.   LINES/TUBES: ETT 11/29 >>> CVL 11/29 >>> Left radial 11/29>>>   ASSESSMENT / PLAN:  PULMONARY A: Acute respiratory failure secondary to cardiac arrest, pulm edema  P:   abg reviewed, rewarming, may need increase MV when at normothermia May need lasix, edema noted and pressors high remain pcxr in am  abg at end rewarm  CARDIOVASCULAR A:  Cardiac arrest - VT/VF STEMI s/p PCI 11/29 HTN Shock inducing HTN map 85 Rel AI   P:  MAP goal 60 at end rewarm Asa, Brilintia cvp noted 2, rewarm - bolus Levo to this goal 60 at end rewarm Low threshold bolus during rewarm Add stress roids Statin add  RENAL A:   CKD At risk brain edema P:   Bolus Keep fluids, consider maintenance increase, cvp 2 Avoid free water  GASTROINTESTINAL A:   Transaminitis  P:   NPO Pepcid for SUP Tf start  HEMATOLOGIC A:   dEmargination, leukocytosis  P:  Follow CBC Antiplatelet/anticoagulation per cardiology Sub q heparin  INFECTIOUS A:   Leukocytosis Higher risk for infection from cooling  P:   levolfoxacin 11/30>>>  Sputum pending If spike, ad  dvanc  ENDOCRINE A:   Hyperglycemia without history of DM   Rel AI P:   CBG monitoring and SSI lantus Add Tf coverage  Add stress roids tsh  NEUROLOGIC A:   Acute anoxic brain injury eeg neg P:   RASS goal: -1 to 0 Fentanyl gtt for analgesia Versed gtt for deep sedation Nimbex to avoid shivering until rewarm completed then dc Await to assess neurostatus  FAMILY  - Updates: no family here  - Inter-disciplinary family meet or Palliative Care meeting due by:  12/7   Ccm time 35 min  Lavon Paganini. Titus Mould, MD, [place] Pgr: [place] [facility]

## 2015-11-13 NOTE — Progress Notes (Signed)
No urine output for the last three hours, bladder scan revealed 146ml retained. Dr Emmit Alexanders made aware, order to flush the catheter obtained.

## 2015-11-13 NOTE — Progress Notes (Signed)
69 year old gentleman with witnessed out-of-hospital arrest found to have anterior MI with LAD PCI. Has significant ischemic cardiomyopathy by LV gram. Is currently in rewarming process from the hypothermia protocol.  Subjective:  Remains Sedated this AM.  Warming protocol initiated for exam  Objective:  Vital Signs in the last 24 hours: Temp:  [91.2 F (32.9 C)-97 F (36.1 C)] 97 F (36.1 C) (12/01 1500) Pulse Rate:  [58-124] 102 (12/01 1528) Resp:  [20-24] 20 (12/01 1528) BP: (107-149)/(51-67) 107/51 mmHg (12/01 1528) SpO2:  [98 %-100 %] 98 % (12/01 1500) Arterial Line BP: (118-264)/(28-72) 151/72 mmHg (12/01 1500) FiO2 (%):  [40 %] 40 % (12/01 1529)  Intake/Output from previous day: 11/30 0701 - 12/01 0700 In: 2171.2 [I.V.:1691.2; NG/GT:80; IV Piggyback:400] Out: 695 [Urine:695]  Physical Exam: General: overweight elderly Caucasian male, sedated HEENT: constricted pupils, ETT in place, L IJ  Cardiac: RRR, no rubs, murmurs or gallops Pulm: clear to auscultation bilaterally in the anterior lung fields, no wheezes, rales, or rhonchi Abd: soft, nontender, nondistended, BS present Ext: cool to touch, 2+ pulses in all four extremities, R a-line, R arm with resolving hematoma; still some swelling in the hand and arm, but soft Neuro: unresponsive to pain   Lab Results:  Recent Labs  11/12/15 0804 11/13/15 0500 11/13/15 1239  WBC 28.0* 28.6*  --   HGB 15.8 16.0 15.0  PLT 233 238  --     Recent Labs  11/13/15 0500 11/13/15 0600 11/13/15 1239  NA 136 134* 138  K 4.2 4.2 4.4  CL 110 108 108  CO2 18* 18*  --   GLUCOSE 172* 209* 173*  BUN 19 19 20   CREATININE 0.96 0.97 0.80    Recent Labs  11/12/15 1138 11/12/15 1800  TROPONINI 12.23* 8.53*    Cardiac Studies: Procedures    Coronary Stent Intervention   Left Heart Cath and Cors/Grafts Angiography    Conclusion    1. Total occlusion of the proximal LAD, treated successfully with primary PCI  (drug-eluting stent) 2. Nonobstructive LCx and RCA stenoses 3. Severe segmental LV systolic dysfunction, LVEF 25%   Tele: Ocassional PVCs  Chest x-ray this morning: Increased interstitial density in both lung bases consistent with infiltrate versus edema. Prominent upper lobe pulmonary vascularity.  Assessment/Plan:  Mr. Jinkins is a 69 year old male with rheumatoid arthritis, hypertension hospitalized for PEA arrest in setting of anterior STEMI now s/p DES to the proximal LAD found to have ischemic cardiomyopathy, abnormal LFTs, acute encephalopathy, and hyperglycemia. Overall prognosis guarded. Active Problems:   ST elevation (STEMI) myocardial infarction involving left anterior descending coronary artery [facility])   Cardiac arrest with ventricular fibrillation ([name])   Acute respiratory failure with hypoxia ([name])   Shock circulatory ([name])   Coma ([name])   CAD S/P PCI OF pLAD with 3.5 mm x38 mm Promus DES (3.75 mm)   Cardiomyopathy, ischemic - EF 20-25% by LV Gram   Rheumatoid arthritis ([place])   PEA arrest 2/2 anterior STEMI now s/p DES to the proximal LAD: ROSC achieved prior to arrival. EKG this AM with ST changes in III, avF and T-wave inversions in V4-V6. Troponin 26.88 this AM, down from 42.25 on admission.   Continue ASA, Brillinta  Troponin continues to trend down. No need to recheck.  Right arm seems stable. No signs of compartment syndrome. Palpable pulse with positive Barbeau  Defer B-blocker and ARB  given that he is on pressor support right now in the setting of cooling protocol  PCCM  following, appreciate recommendations    Cardiogenic shock: Likely improving, but remains on pressors to maintain hypothermia protocol mandated MAP >80  Unable to use beta blocker ARB.  Urine output appears to be decreased, but creatinine remaining stable.  LVEDP is not significantly elevated at the time MI. Not sure if he will not need diuretic -- if he still requires pressors while no  longer on cooling protocol and sedation, we may need to consider right heart cath to assess filling pressures. Echo may help with this as well.  With central line in place, we can assess CVP  Ischemic cardiomyopathy: EF 25% as noted above likely in the setting of recent MI.  Defer repeat echo once he is off pressors - We'll probably okay for echo tomorrow once he is no longer cooling protocol and likely unless aggressive pressor support.  Pulmonary vascularity noted on chest x-ray, possible pulmonary edema also. No issues with oxygenation however. At this point, would not treat with diuretic unless there is issues with oxygenation and possible extubation  Hyperglycemia: CBGs 200s since admission.  Currently managed by PCCM  A1c pending  Continue insulin gtt  Acute encephalopathy: Secondary to PEA arrest as noted above and now on sedation for cooling protocol. Head CT without acute findings on admission.   EEG shows diffuse slowing, likely medication effect.  Abnormal LFTs: AST/ALT in 200s on admission presumably from shock.  Likely from shock liver. Now improving on recent reevaluation. Will start statin.  Leukocytosis: On Levaquin by [facility]  For now - watchful waiting until re-warming  & awakening process.  Leonie Man, M.D., M.S. Interventional Cardiologist   Pager # [phone]   11/13/2015, 3:52 PM

## 2015-11-14 ENCOUNTER — Inpatient Hospital Stay (HOSPITAL_COMMUNITY): Payer: BLUE CROSS/BLUE SHIELD

## 2015-11-14 DIAGNOSIS — R079 Chest pain, unspecified: Secondary | ICD-10-CM

## 2015-11-14 LAB — CBC
HCT: 35.2 % — ABNORMAL LOW (ref 39.0–52.0)
HEMOGLOBIN: 12 g/dL — AB (ref 13.0–17.0)
MCH: 31.3 pg (ref 26.0–34.0)
MCHC: 34.1 g/dL (ref 30.0–36.0)
MCV: 91.9 fL (ref 78.0–100.0)
PLATELETS: 150 10*3/uL (ref 150–400)
RBC: 3.83 MIL/uL — AB (ref 4.22–5.81)
RDW: 14.7 % (ref 11.5–15.5)
WBC: 14.3 10*3/uL — ABNORMAL HIGH (ref 4.0–10.5)

## 2015-11-14 LAB — COMPREHENSIVE METABOLIC PANEL
ALK PHOS: 53 U/L (ref 38–126)
ALT: 80 U/L — AB (ref 17–63)
ANION GAP: 7 (ref 5–15)
AST: 39 U/L (ref 15–41)
Albumin: 2.5 g/dL — ABNORMAL LOW (ref 3.5–5.0)
BILIRUBIN TOTAL: 0.4 mg/dL (ref 0.3–1.2)
BUN: 17 mg/dL (ref 6–20)
CALCIUM: 8 mg/dL — AB (ref 8.9–10.3)
CO2: 23 mmol/L (ref 22–32)
CREATININE: 0.93 mg/dL (ref 0.61–1.24)
Chloride: 107 mmol/L (ref 101–111)
GFR calc non Af Amer: >60 mL/min (ref >60)
Glucose, Bld: 181 mg/dL — ABNORMAL HIGH (ref 65–99)
Potassium: 4.2 mmol/L (ref 3.5–5.1)
SODIUM: 137 mmol/L (ref 135–145)
TOTAL PROTEIN: 4.4 g/dL — AB (ref 6.5–8.1)

## 2015-11-14 LAB — PROCALCITONIN: Procalcitonin: 0.13 ng/mL

## 2015-11-14 LAB — MAGNESIUM: MAGNESIUM: 1.7 mg/dL (ref 1.7–2.4)

## 2015-11-14 LAB — PHOSPHORUS: Phosphorus: 2.1 mg/dL — ABNORMAL LOW (ref 2.5–4.6)

## 2015-11-14 LAB — GLUCOSE, CAPILLARY
GLUCOSE-CAPILLARY: 178 mg/dL — AB (ref 65–99)
Glucose-Capillary: 175 mg/dL — ABNORMAL HIGH (ref 65–99)
Glucose-Capillary: 110 mg/dL — ABNORMAL HIGH (ref 65–99)
Glucose-Capillary: 108 mg/dL — ABNORMAL HIGH (ref 65–99)
Glucose-Capillary: 153 mg/dL — ABNORMAL HIGH (ref 65–99)

## 2015-11-14 LAB — TRIGLYCERIDES: Triglycerides: 291 mg/dL — ABNORMAL HIGH (ref <150)

## 2015-11-14 MED ORDER — PROPOFOL 1000 MG/100ML IV EMUL
0.0000 ug/kg/min | INTRAVENOUS | Status: DC
  Administered 2015-11-14: 5 ug/kg/min via INTRAVENOUS
  Administered 2015-11-14: 25 ug/kg/min via INTRAVENOUS
  Administered 2015-11-14: 45 ug/kg/min via INTRAVENOUS
  Administered 2015-11-15: 40 ug/kg/min via INTRAVENOUS
  Administered 2015-11-15: 35 ug/kg/min via INTRAVENOUS
  Administered 2015-11-15 (×3): 40 ug/kg/min via INTRAVENOUS
  Administered 2015-11-16: 20 ug/kg/min via INTRAVENOUS
  Administered 2015-11-16: 35 ug/kg/min via INTRAVENOUS
  Administered 2015-11-17: 15 ug/kg/min via INTRAVENOUS
  Administered 2015-11-17: 5 ug/kg/min via INTRAVENOUS
  Administered 2015-11-18 – 2015-11-19 (×2): 25 ug/kg/min via INTRAVENOUS
  Administered 2015-11-19: 18 ug/kg/min via INTRAVENOUS
  Administered 2015-11-20: 10 ug/kg/min via INTRAVENOUS
  Filled 2015-11-14 (×20): qty 100

## 2015-11-14 MED ORDER — PERFLUTREN LIPID MICROSPHERE
INTRAVENOUS | Status: AC
  Administered 2015-11-14: 3 mL
  Filled 2015-11-14: qty 10

## 2015-11-14 MED ORDER — SODIUM CHLORIDE 0.9 % IV SOLN
INTRAVENOUS | Status: DC
  Administered 2015-11-16 – 2015-12-02 (×7): via INTRAVENOUS

## 2015-11-14 MED ORDER — SODIUM PHOSPHATE 3 MMOLE/ML IV SOLN
15.0000 mmol | Freq: Once | INTRAVENOUS | Status: AC
  Administered 2015-11-14: 15 mmol via INTRAVENOUS
  Filled 2015-11-14: qty 5

## 2015-11-14 MED ORDER — MAGNESIUM SULFATE 2 GM/50ML IV SOLN
2.0000 g | Freq: Once | INTRAVENOUS | Status: AC
Start: 2015-11-14 — End: 2015-11-14
  Administered 2015-11-14: 2 g via INTRAVENOUS
  Filled 2015-11-14: qty 50

## 2015-11-14 MED ORDER — METOPROLOL TARTRATE 1 MG/ML IV SOLN
2.5000 mg | INTRAVENOUS | Status: DC
  Administered 2015-11-14 – 2015-11-21 (×36): 2.5 mg via INTRAVENOUS
  Filled 2015-11-14 (×38): qty 5

## 2015-11-14 MED ORDER — FUROSEMIDE 10 MG/ML IJ SOLN
40.0000 mg | Freq: Two times a day (BID) | INTRAMUSCULAR | Status: AC
  Administered 2015-11-14 – 2015-11-15 (×4): 40 mg via INTRAVENOUS
  Filled 2015-11-14 (×4): qty 4

## 2015-11-14 NOTE — Care Management Important Message (Signed)
Important Message  Patient Details  Name: [name] MRN: [id number] Date of Birth: [birth year]   Medicare Important Message Given:  Yes    [name] [place] 11/14/2015, 1:11 PM

## 2015-11-14 NOTE — Progress Notes (Signed)
RT called to pt room due to increased agitation.

## 2015-11-14 NOTE — Progress Notes (Signed)
69 year old gentleman with witnessed out-of-hospital arrest found to have anterior MI with LAD PCI. Has significant ischemic cardiomyopathy by LV gram. Is currently in rewarming process from the hypothermia protocol.  Subjective:  Remains Sedated this AM- became agitated overnight ; Rewarming completed. Opens eyes with apparent response to verbal stimuli per RN.  Objective:  Vital Signs in the last 24 hours: Temp:  [93.9 F (34.4 C)-99.9 F (37.7 C)] 98.2 F (36.8 C) (12/02 0800) Pulse Rate:  [80-135] 82 (12/02 0717) Resp:  [19-31] 20 (12/02 0717) BP: (101-149)/(44-67) 109/44 mmHg (12/02 0717) SpO2:  [98 %-100 %] 100 % (12/02 0717) Arterial Line BP: (100-157)/(45-72) 110/45 mmHg (12/02 0700) FiO2 (%):  [40 %] 40 % (12/02 0717) Weight:  [189 lb 6.4 oz (85.91 kg)] 189 lb 6.4 oz (85.91 kg) (12/02 0500)  Intake/Output from previous day: 12/01 0701 - 12/02 0700 In: 4351.5 [I.V.:2783.2; NG/GT:798.3; IV Piggyback:750] Out: [id number] [Urine:1465; Emesis/NG output:100]  Physical Exam: General: overweight elderly Caucasian male, partially sedated - but making random/non-purposeful movement of all 4 extr HEENT: constricted pupils, ETT in place, L IJ  Cardiac: RRR, no rubs, murmurs or gallops Pulm: clear to auscultation bilaterally in the anterior lung fields, no wheezes, rales, or rhonchi Abd: soft, nontender, nondistended, BS present Ext: cool to touch, 2+ pulses in all four extremities, R a-line, R arm with resolving hematoma; still some swelling in the hand and arm, but soft Neuro: actually seemed to open eyes & turned his head to command.   Lab Results:  Recent Labs  11/13/15 0500  11/13/15 1628 11/14/15 0400  WBC 28.6*  --   --  14.3*  HGB 16.0  < > 14.3 12.0*  PLT 238  --   --  150  < > = values in this interval not displayed.  Recent Labs  11/13/15 0600  11/13/15 1628 11/14/15 0400  NA 134*  < > 138 137  K 4.2  < > 4.9 4.2  CL 108  < > 107 107  CO2 18*  --   --  23    GLUCOSE 209*  < > 153* 181*  BUN 19  < > 20 17  CREATININE 0.97  < > 0.90 0.93  < > = values in this interval not displayed.  Recent Labs  11/12/15 1138 11/12/15 1800  TROPONINI 12.23* 8.53*    Cardiac Studies: Procedures    Coronary Stent Intervention   Left Heart Cath and Cors/Grafts Angiography    Conclusion    1. Total occlusion of the proximal LAD, treated successfully with primary PCI (drug-eluting stent) 2. Nonobstructive LCx and RCA stenoses 3. Severe segmental LV systolic dysfunction, LVEF 25%   Tele: Ocassional PVCs  Chest x-ray this morning: Increased interstitial density in both lung bases consistent with infiltrate versus edema. Prominent upper lobe pulmonary vascularity.  Assessment/Plan:  [name] is a 69 year old male with rheumatoid arthritis, hypertension hospitalized for PEA arrest in setting of anterior STEMI now s/p DES to the proximal LAD found to have ischemic cardiomyopathy, abnormal LFTs, acute encephalopathy, and hyperglycemia. Overall prognosis guarded.  Active Problems:   ST elevation (STEMI) myocardial infarction involving left anterior descending coronary artery [facility])   Cardiac arrest with ventricular fibrillation ([name])   Acute respiratory failure with hypoxia ([name])   Shock circulatory ([name])   Coma ([name])   CAD S/P PCI OF pLAD with 3.5 mm x38 mm Promus DES (3.75 mm)   Cardiomyopathy, ischemic - EF 20-25% by LV Gram   Rheumatoid arthritis (  [place])  Intubation for Resp failure - managed by PCCM.  PEA arrest 2/2 anterior STEMI now s/p DES to the proximal LAD: ROSC achieved prior to arrival. EKG this AM with ST changes in III, avF and T-wave inversions in V4-V6. Troponin 26.88 this AM, down from 42.25 on admission.   Continue ASA, Brillinta  Troponin continues to trend down. No need to recheck.  Right arm seems stable. No signs of compartment syndrome. Palpable pulse with positive Barbeau  Defer B-blocker and ARB  given that he is on  pressor support right now in the setting of cooling protocol  PCCM following, appreciate recommendations  Cardiogenic shock: Likely improving, but remains on pressors to maintain hypothermia protocol mandated MAP >80  Unable to use beta blocker ARB.  Urine output appears to be decreased, but creatinine remaining stable. (no indication of CKD)  LVEDP was not significantly elevated at the time MI. Not sure if he will not need diuretic -- if he still requires pressors while no longer on cooling protocol and sedation, we may need to consider right heart cath to assess filling pressures. Echo may help with this as well.  With central line in place, we can assess CVP  Ischemic cardiomyopathy: EF 25% as noted above likely in the setting of recent MI.  Plan for Echo today since Levophed gtt has reduced. - will help evaluation of fluid status.  Pulmonary vascularity noted on chest x-ray, possible pulmonary edema also. No issues with oxygenation, however is Net ++ ~7L -  Agree with starting diuresis (while still on pressors.  Hyperglycemia: CBGs 200s since admissio - now improved to 150-170.  Currently managed by PCCM  A1c probably once he is more stable  Continue insulin gtt  Acute encephalopathy: Secondary to PEA arrest as noted above and now on sedation for cooling protocol. Head CT without acute findings on admission.   EEG shows diffuse slowing, likely medication effect.  Abnormal LFTs: AST/ALT in 200s on admission presumably from shock liver . - Resolved.  Started Statin  Leukocytosis: On Levaquin by [facility] M; stress dose steroids; thick secretions; plan is 5 day course until 12/4  Replete electrolytes & TF per PCCM  For now - watchful waiting until re-warming  & awakening process -- plan is neuro responsiveness reassessment beginning today; difficult with recurrent need for sedation due to agitation.    Leonie Man, M.D., M.S. Interventional Cardiologist   Pager #  [phone]   11/14/2015, 8:34 AM

## 2015-11-14 NOTE — Progress Notes (Signed)
[place], [place], contacted per protocol.

## 2015-11-14 NOTE — Progress Notes (Signed)
ANTIBIOTIC CONSULT NOTE - INITIAL  Pharmacy Consult for levaquin Indication: PNA  Allergies  Allergen Reactions  . Contrast Media [Iodinated Diagnostic Agents] Hives    03/28/14 pt received 1 hour emergent prep and pt did good and had no complaints after scan.  . Ibuprofen     REACTION: unspecified  . Penicillins     REACTION: unspecified  . Sulfamethoxazole     REACTION: unspecified    Patient Measurements: Height: 5\' 7"  (170.2 cm) Weight: 189 lb 6.4 oz (85.91 kg) IBW/kg (Calculated) : 66.1  Vital Signs: Temp: 98.2 F (36.8 C) (12/02 0800) Temp Source: Core (Comment) (12/02 0800) BP: 109/44 mmHg (12/02 0717) Pulse Rate: 82 (12/02 0717) Intake/Output from previous day: 12/01 0701 - 12/02 0700 In: 4351.5 [I.V.:2783.2; NG/GT:798.3; IV Piggyback:750] Out: [id number]; Emesis/NG output:100] Intake/Output from this shift:    Labs:  Recent Labs  11/12/15 0804  11/13/15 0500  11/13/15 1239 11/13/15 1628 11/14/15 0400  WBC 28.0*  --  28.6*  --   --   --  14.3*  HGB 15.8  --  16.0  --  15.0 14.3 12.0*  PLT 233  --  238  --   --   --  150  CREATININE 0.89  < > 0.96  < > 0.80 0.90 0.93  < > = values in this interval not displayed. Estimated Creatinine Clearance: 79.6 mL/min (by C-G formula based on Cr of 0.93). No results for input(s): VANCOTROUGH, VANCOPEAK, VANCORANDOM, GENTTROUGH, GENTPEAK, GENTRANDOM, TOBRATROUGH, TOBRAPEAK, TOBRARND, AMIKACINPEAK, AMIKACINTROU, AMIKACIN in the last 72 hours.   Microbiology: Recent Results (from the past 720 hour(s))  MRSA PCR Screening     Status: None   Collection Time: 11/12/15 12:51 AM  Result Value Ref Range Status   MRSA by PCR NEGATIVE NEGATIVE Final    Comment:        The GeneXpert MRSA Assay (FDA approved for NASAL specimens only), is one component of a comprehensive MRSA colonization surveillance program. It is not intended to diagnose MRSA infection nor to guide or monitor treatment for MRSA infections.      Medical History: Past Medical History  Diagnosis Date  . Rheumatoid arthritis(714.0) 1998  . Nephrolithiasis 1968  . Insomnia   . Allergic rhinitis   . Impaired glucose tolerance   . Hypertension   . ST elevation (STEMI) myocardial infarction involving left anterior descending coronary artery ([place]) 11/11/2015    100% LAD, Cardiogenic Shock. -- EF ~20-25%  . Cardiac arrest with ventricular fibrillation ([place]) 11/11/2015  . CAD S/P PCI OF pLAD with 3.5 mm x38 mm Promus DES (3.75 mm) 11/12/2015  . Cardiomyopathy, ischemic - EF 20-25% by LV Gram 11/12/2015    There is severe left ventricular systolic dysfunction. The left ventricular ejection fraction is 25-35% by visual estimate. There are wall motion abnormalities in the left ventricle. There are segmental wall motion abnormalities in the left ventricle. The anterolateral and periapical LV walls are akinetic with an estimated LVEF of 25%     Medications:  Prescriptions prior to admission  Medication Sig Dispense Refill Last Dose  . Cetirizine HCl 10 MG CAPS Take 10 mg by mouth daily.   11/11/2015 at Unknown time  . Cholecalciferol (VITAMIN D-3) 1000 UNITS CAPS Take 2,000 Units by mouth daily.    11/11/2015 at Unknown time  . Coenzyme Q10 (CO Q 10) 10 MG CAPS Take 10 mg by mouth daily.   11/11/2015 at Unknown time  . fexofenadine (ALLEGRA) 30 MG tablet Take 30  mg by mouth daily.   11/11/2015 at Unknown time  . fluticasone (FLONASE) 50 MCG/ACT nasal spray USE 2 SPRAYS INTO NOSE DAILY 48 g 3 11/11/2015 at Unknown time  . folic acid (FOLVITE) 1 MG tablet Take 1 mg by mouth daily.     11/11/2015 at Unknown time  . Golimumab (Gray ARIA IV) Inject into the vein. Infusion every 8 weeks   09/22/2015  . hydrochlorothiazide (HYDRODIURIL) 25 MG tablet Take 1 tablet daily (Patient taking differently: Take 25 mg by mouth daily) 90 tablet 1 11/11/2015 at Unknown time  . HYDROcodone-acetaminophen (NORCO/VICODIN) 5-325 MG per tablet Take 1-2 tablets  by mouth every 6 (six) hours as needed for moderate pain. 20 tablet 0 unknown  . meloxicam (MOBIC) 7.5 MG tablet Take 1 tablet (7.5 mg total) by mouth 2 (two) times daily. 90 tablet 3 11/11/2015 at Unknown time  . methotrexate (RHEUMATREX) 2.5 MG tablet Take 20 mg by mouth once a week. 8 tablets every week on Sunday Morning Caution:Chemotherapy. Protect from light.   11/09/2015  . Probiotic Product (PRO-BIOTIC BLEND PO) Take 1 tablet by mouth daily.   11/11/2015 at Unknown time  . saw palmetto 160 MG capsule Take 320 mg by mouth daily.    11/11/2015 at Unknown time  . tamsulosin (FLOMAX) 0.4 MG CAPS capsule Take 1 capsule (0.4 mg total) by mouth daily. 90 capsule 0 11/10/2015  . traMADol (ULTRAM) 50 MG tablet Take 50 mg by mouth every 6 (six) hours as needed for moderate pain.   unknown  . traZODone (DESYREL) 100 MG tablet Take 1 tablet (100 mg total) by mouth at bedtime. 90 tablet 1 11/10/2015   Scheduled:  . antiseptic oral rinse  7 mL Mouth Rinse 10 times per day  . artificial tears  1 application Both Eyes 3 times per day  . aspirin  81 mg Oral Daily  . atorvastatin  80 mg Oral q1800  . chlorhexidine gluconate  15 mL Mouth Rinse BID  . famotidine (PEPCID) IV  20 mg Intravenous Q12H  . feeding supplement (PRO-STAT SUGAR FREE 64)  30 mL Per Tube BID  . heparin  5,000 Units Subcutaneous 3 times per day  . hydrocortisone sod succinate (SOLU-CORTEF) inj  50 mg Intravenous Q6H  . insulin aspart  0-20 Units Subcutaneous 6 times per day  . insulin aspart  3 Units Subcutaneous 6 times per day  . levofloxacin (LEVAQUIN) IV  750 mg Intravenous Q24H  . sodium chloride  10-40 mL Intracatheter Q12H  . sodium chloride  3 mL Intravenous Q12H  . ticagrelor  90 mg Oral BID    Assessment: 69 yo male s/o out of hospital cardiac arrest on the hypothermia protocol and s/p cath with DES to the LAD. Pharmacy has been consulted to dose levaquin for CAP. WBC 28>14.3, afeb, SCr stable, CrCl ~ 79. Pt now  normothermic.   11/30>>levaquin>>  Plan: -Continue Levaquin 750mg  IV q24hr -Will follow renal function and clinical progress -F/U LOT   Melba Araki C. Lennox Grumbles, PharmD Pharmacy Resident  Pager: [phone] 11/14/2015 8:18 AM

## 2015-11-14 NOTE — Consult Note (Signed)
PULMONARY / CRITICAL CARE MEDICINE   Name: [name] MRN: [id number] DOB: Oct 28, 1946    ADMISSION DATE:  11/11/2015 CONSULTATION DATE:  11/11/2015  REFERRING MD :  Dr. Burt Knack  CHIEF COMPLAINT:  Cardiac arrest  HISTORY OF PRESENT ILLNESS:  69 year old male with PMH as below, which is significant for RA and HTN. He presented to [facility] ED 11/29 after a witnessed cardiac arrest at home. He has had intermittent chest and arm pain for about 2 weeks status post mechanical fall. 11/29 chest pain acutely worsened and he unfortunately suffered a cardiac arrest, which was witnessed by his wife and EMS was called. Family initiated CPR. EMS arrived and AED was placed advising shock. 2 shocks were delivered and ROSC was achieved. 2 rounds epinephrine also administerred. EKG in the field was concerning for STEMI and code STEMI was called. In ED patient was unresponsive, he was intubated and taken emergently to cardiac cath lab where he remains at this time. Occlusive LAD lesion identified and intervened upon. PCCM consulted for ICU care. Total downtime estimated at less than 15 mins.   SUBJECTIVE: Remains sedated, rewarming, was aggitated overnight, required re add sedation  VITAL SIGNS: BP 126/53 mmHg  Pulse 80  Temp(Src) 98.4 F (36.9 C) (Core (Comment))  Resp 20  Ht 5\' 7"  (1.702 m)  Wt 189 lb 6.4 oz (85.91 kg)  BMI 29.66 kg/m2  SpO2 100%  HEMODYNAMICS: CVP:  [2 mmHg-6 mmHg] 5 mmHg  VENTILATOR SETTINGS: Vent Mode:  [-] PRVC FiO2 (%):  [40 %] 40 % Set Rate:  [20 bmp] 20 bmp Vt Set:  [550 mL] 550 mL PEEP:  [5 cmH20] 5 cmH20 Plateau Pressure:  [14 cmH20-20 cmH20] 14 cmH20  INTAKE / OUTPUT: I/O last 3 completed shifts: In: 5053.9 [I.V.:3419.5; Other:20; NG/GT:764.3; IV Piggyback:850] Out: [id number] [Urine:1715; Emesis/NG output:100]  PHYSICAL EXAMINATION: General: elderly man sedated on vent, NAD Neuro:  rass 1 HEENT: PERRL sluggish Cardiovascular:  s1 s2 RRR, no MRG Lungs:  coarse breath sounds bilaterally Abdomen:  Soft, non-tender, non-distended Musculoskeletal:  No edema Skin:  Grossly intact  LABS:  CBC  Recent Labs Lab 11/12/15 0804 11/13/15 0500 11/13/15 1239 11/13/15 1628 11/14/15 0400  WBC 28.0* 28.6*  --   --  14.3*  HGB 15.8 16.0 15.0 14.3 12.0*  HCT 44.4 43.5 44.0 42.0 35.2*  PLT 233 238  --   --  150   Coag's  Recent Labs Lab 11/12/15 0015 11/12/15 0932  APTT 75* 24  INR 1.19 1.11   BMET  Recent Labs Lab 11/13/15 0500 11/13/15 0600 11/13/15 1239 11/13/15 1628 11/14/15 0400  NA 136 134* 138 138 137  K 4.2 4.2 4.4 4.9 4.2  CL 110 108 108 107 107  CO2 18* 18*  --   --  23  BUN 19 19 20 20 17   CREATININE 0.96 0.97 0.80 0.90 0.93  GLUCOSE 172* 209* 173* 153* 181*   Electrolytes  Recent Labs Lab 11/12/15 0804  11/13/15 0500 11/13/15 0600 11/14/15 0400  CALCIUM 8.2*  < > 8.4* 8.2* 8.0*  MG 3.0*  --  2.3  --  1.7  PHOS 1.9*  --  2.2*  --  2.1*  < > = values in this interval not displayed. Sepsis Markers  Recent Labs Lab 11/12/15 1300 11/13/15 11/13/15 0500  LATICACIDVEN  --  1.5  --   PROCALCITON 0.22  --  0.20   ABG  Recent Labs Lab 11/12/15 0330 11/13/15 0305 11/13/15 1849  PHART 7.391 7.416 7.309*  PCO2ART 31.2* 25.4* 38.2  PO2ART 164* 70.7* 95.0   Liver Enzymes  Recent Labs Lab 11/12/15 0932 11/13/15 0500 11/14/15 0400  AST 250* 118* 39  ALT 207* 158* 80*  ALKPHOS 67 62 53  BILITOT 1.1 0.7 0.4  ALBUMIN 3.6 3.2* 2.5*   Cardiac Enzymes  Recent Labs Lab 11/12/15 0804 11/12/15 1138 11/12/15 1800  TROPONINI 26.88* 12.23* 8.53*   Glucose  Recent Labs Lab 11/13/15 0420 11/13/15 0837 11/13/15 1215 11/13/15 1556 11/13/15 2001 11/14/15 0031  GLUCAP 189* 206* 161* 157* 109* 178*    Imaging No results found.   STUDIES:  LHC 11/29 >>> stent lad 11/29 ct head>>>nAD eeg 11/30 suppression  CULTURES: n/a  ANTIBIOTICS: n/a  SIGNIFICANT EVENTS: 11/29 cardiac arrest >  cath lab, out to ICU, therapeutic hypothermia.   LINES/TUBES: ETT 11/29 >>> CVL 11/29 >>> Left radial 11/29>>>   ASSESSMENT / PLAN:  PULMONARY A: Acute respiratory failure secondary to cardiac arrest, pulm edema  P:   pcxr in am  Wean SBT cpap 5 ps 5, goal 2 hr, unlikely neurostatus supports extubation lasix likley required  CARDIOVASCULAR A:  Cardiac arrest - VT/VF STEMI s/p PCI 11/29 HTN Shock inducing HTN map 85 Rel AI   P:  MAP goal 60 , levo  Asa, Brilinta hydrocortisone 50 mg Q6H remain Continue statin Even to neg goals  RENAL A:   CKD?? At risk brain edema Hypophosphatemia, hypomag  P:   Avoid free water Lasix Chem in am  supp mg, phos  GASTROINTESTINAL A:   Transaminitis- resolving  P:   NPO Pepcid for SUP Tube feeds at goal  HEMATOLOGIC A:   demargination, leukocytosis  P:  Follow CBC Antiplatelet/anticoagulation per cardiology Sub q heparin  INFECTIOUS A:   Leukocytosis Higher risk for infection from cooling  P:   levofloxacin 11/30>>>plan stop 12.4  Sputum pending, pct low , low suspcion active inferction, add 5 days stop  ENDOCRINE A:   Hyperglycemia without history of DM  Rel AI  P:   CBG monitoring and SSI Continue TF coverage Continue stress steroids TSH- added on to AM labs  NEUROLOGIC A:   Acute anoxic brain injury eeg neg  P:   RASS goal: -1  Fentanyl gtt for analgesia Versed gtt for deep sedation Await to assess neurostatus  FAMILY  - Updates: no family here  - Inter-disciplinary family meet or Palliative Care meeting due by:  12/7    Attending note to follow  Albin Felling, MD, MPH Internal Medicine Resident, PGY-II Pager: [phone]    STAFF NOTE: Linwood Dibbles, MD FACP have personally reviewed patient's available data, including medical history, events of note, physical examination and test results as part of my evaluation. I have discussed with resident/NP and other care providers  such as pharmacist, RN and RRT. In addition, I personally evaluated patient and elicited key findings of: more alert, agitated, does not follow commands yet, EEG and ct head were reassuring, wouild favor prop  And dc versed, fent with wua, wean planned cpap5 ps 5, goal 2 hrs, neurostaus needs to improve for extubation success, levo to off is goal and likley, unlikely infected, add stop date abx, add lasix to neg balance 1 liter The patient is critically ill with multiple organ systems failure and requires high complexity decision making for assessment and support, frequent evaluation and titration of therapies, application of advanced monitoring technologies and extensive interpretation of multiple databases.   Critical Care Time  devoted to patient care services described in this note is30 Minutes. This time reflects time of care of this signee: Merrie Roof, MD FACP. This critical care time does not reflect procedure time, or teaching time or supervisory time of PA/NP/Med student/Med Resident etc but could involve care discussion time. Rest per NP/medical resident whose note is outlined above and that I agree with   Lavon Paganini. Titus Mould, MD, [place] Pgr: [facility] 11/14/2015 8:40 AM

## 2015-11-14 NOTE — Progress Notes (Signed)
Wasted 20mg  of Versed in sink with Millie, RN at about 1000.

## 2015-11-14 NOTE — Progress Notes (Signed)
Called Dr. Titus Mould about tachy heart rate (130s) ordered Metoprolol IV 2.5mg  Q4.  Hold if systolic is less than 90 or HR less than 60.  First dose was given and HR is now in the 90s.

## 2015-11-14 NOTE — Progress Notes (Signed)
Echocardiogram 2D Echocardiogram with Definity has been performed.  [name] 11/14/2015, 12:41 PM

## 2015-11-15 ENCOUNTER — Inpatient Hospital Stay (HOSPITAL_COMMUNITY): Payer: BLUE CROSS/BLUE SHIELD

## 2015-11-15 LAB — CBC WITH DIFFERENTIAL/PLATELET
Basophils Absolute: 0 10*3/uL (ref 0.0–0.1)
Basophils Relative: 0 %
Eosinophils Absolute: 0 10*3/uL (ref 0.0–0.7)
Eosinophils Relative: 0 %
HEMATOCRIT: 33.7 % — AB (ref 39.0–52.0)
Hemoglobin: 11.7 g/dL — ABNORMAL LOW (ref 13.0–17.0)
LYMPHS PCT: 7 %
Lymphs Abs: 0.9 10*3/uL (ref 0.7–4.0)
MCH: 31.5 pg (ref 26.0–34.0)
MCHC: 34.7 g/dL (ref 30.0–36.0)
MCV: 90.6 fL (ref 78.0–100.0)
MONO ABS: 0.5 10*3/uL (ref 0.1–1.0)
Monocytes Relative: 4 %
NEUTROS ABS: 11.2 10*3/uL — AB (ref 1.7–7.7)
Neutrophils Relative %: 89 %
Platelets: 137 10*3/uL — ABNORMAL LOW (ref 150–400)
RBC: 3.72 MIL/uL — ABNORMAL LOW (ref 4.22–5.81)
RDW: 14.6 % (ref 11.5–15.5)
WBC: 12.5 10*3/uL — ABNORMAL HIGH (ref 4.0–10.5)

## 2015-11-15 LAB — COMPREHENSIVE METABOLIC PANEL
ALBUMIN: 2.6 g/dL — AB (ref 3.5–5.0)
ALT: 78 U/L — ABNORMAL HIGH (ref 17–63)
ANION GAP: 8 (ref 5–15)
AST: 133 U/L — ABNORMAL HIGH (ref 15–41)
Alkaline Phosphatase: 56 U/L (ref 38–126)
BILIRUBIN TOTAL: 0.8 mg/dL (ref 0.3–1.2)
BUN: 29 mg/dL — ABNORMAL HIGH (ref 6–20)
CHLORIDE: 102 mmol/L (ref 101–111)
CO2: 28 mmol/L (ref 22–32)
Calcium: 8.3 mg/dL — ABNORMAL LOW (ref 8.9–10.3)
Creatinine, Ser: 0.85 mg/dL (ref 0.61–1.24)
GFR calc Af Amer: >60 mL/min (ref >60)
GFR calc non Af Amer: >60 mL/min (ref >60)
GLUCOSE: 97 mg/dL (ref 65–99)
POTASSIUM: 3.8 mmol/L (ref 3.5–5.1)
SODIUM: 138 mmol/L (ref 135–145)
TOTAL PROTEIN: 5.1 g/dL — AB (ref 6.5–8.1)

## 2015-11-15 LAB — GLUCOSE, CAPILLARY
GLUCOSE-CAPILLARY: 125 mg/dL — AB (ref 65–99)
GLUCOSE-CAPILLARY: 127 mg/dL — AB (ref 65–99)
GLUCOSE-CAPILLARY: 99 mg/dL (ref 65–99)
Glucose-Capillary: 194 mg/dL — ABNORMAL HIGH (ref 65–99)
Glucose-Capillary: 106 mg/dL — ABNORMAL HIGH (ref 65–99)
Glucose-Capillary: 111 mg/dL — ABNORMAL HIGH (ref 65–99)

## 2015-11-15 LAB — MAGNESIUM: MAGNESIUM: 2.3 mg/dL (ref 1.7–2.4)

## 2015-11-15 LAB — PHOSPHORUS: PHOSPHORUS: 3.1 mg/dL (ref 2.5–4.6)

## 2015-11-15 MED ORDER — FENTANYL BOLUS VIA INFUSION
25.0000 ug | INTRAVENOUS | Status: DC | PRN
  Administered 2015-11-15: 25 ug via INTRAVENOUS
  Administered 2015-11-19 (×4): 50 ug via INTRAVENOUS
  Filled 2015-11-15: qty 50

## 2015-11-15 NOTE — Progress Notes (Signed)
Pt placed back on PS 5 CPAP 5 to wean. Vital stable. RT will continue to monitor.  RN notified.

## 2015-11-15 NOTE — Progress Notes (Signed)
Pt placed back on full vent support due to increased RR 

## 2015-11-15 NOTE — Progress Notes (Signed)
Pt.  was tachycardic, tachypnic, and agitated at 930 this am.  Pt. intermittently following simple commands. Pt. Changed to full support from weaning 5/5 due to agitation and asynchrony. Spoke with Dr. Ashok Cordia and Fentanyl infusion restarted with PRN boluses. Switched back to wean mode on vent at 10:30.        Rounded with Velna Hatchet, NP.  Was instructed to increase sedation secondary to increased agitation at that time. Patient was noted to have shivering like symptoms intermittently.  Pt. Temp was 37.1 but water temp was 6.1.  PA wants to keep pads on (due to be removed today at 1700) and continue to monitor.

## 2015-11-15 NOTE — Progress Notes (Signed)
Patient Name: [name] Date of Encounter: 11/15/2015    SUBJECTIVE: Still intubated. Has rewarmed back to a temperature greater than 37. Eyes open but unable to follow commands.  TELEMETRY:  Sinus rhythm/sinus tachycardia without ventricular ectopy Filed Vitals:   11/15/15 0500 11/15/15 0600 11/15/15 0700 11/15/15 0716  BP: 93/57 106/60 97/59 97/59   Pulse: 85 88 86 85  Temp: 97 F (36.1 C) 97.2 F (36.2 C) 97.7 F (36.5 C)   TempSrc: Core (Comment) Core (Comment) Core (Comment)   Resp: 20 17 16 20   Height:      Weight:      SpO2: 99% 100% 99% 96%    Intake/Output Summary (Last 24 hours) at 11/15/15 0823 Last data filed at 11/15/15 0600  Gross per 24 hour  Intake 3234.93 ml  Output   4030 ml  Net -795.07 ml   LABS: Basic Metabolic Panel:  Recent Labs  11/14/15 0400 11/15/15 0420  NA 137 138  K 4.2 3.8  CL 107 102  CO2 23 28  GLUCOSE 181* 97  BUN 17 29*  CREATININE 0.93 0.85  CALCIUM 8.0* 8.3*  MG 1.7 2.3  PHOS 2.1* 3.1   CBC:  Recent Labs  11/13/15 0500  11/14/15 0400 11/15/15 0420  WBC 28.6*  --  14.3* 12.5*  NEUTROABS 25.4*  --   --  11.2*  HGB 16.0  < > 12.0* 11.7*  HCT 43.5  < > 35.2* 33.7*  MCV 87.5  --  91.9 90.6  PLT 238  --  150 137*  < > = values in this interval not displayed. Cardiac Enzymes:  Recent Labs  11/12/15 1138 11/12/15 1800  TROPONINI 12.23* 8.53*   BNP: Invalid input(s): POCBNP Hemoglobin A1C: No results for input(s): HGBA1C in the last 72 hours. Fasting Lipid Panel:  Recent Labs  11/14/15 1319  TRIG 291*    Radiology/Studies:  AM. chest x-ray reveals mild interstitial edema  Physical Exam: Blood pressure 97/59, pulse 85, temperature 97.7 F (36.5 C), temperature source Core (Comment), resp. rate 20, height 5\' 7"  (1.702 m), weight 191 lb 12.8 oz (87 kg), SpO2 96 %. Weight change: 2 lb 6.5 oz (1.09 kg)  Wt Readings from Last 3 Encounters:  11/15/15 191 lb 12.8 oz (87 kg)  10/03/15 187 lb  (84.823 kg)  07/31/15 183 lb (83.008 kg)   Neck veins are difficult to evaluate  Cardiac sounds are hard to discern due to support apparatus Chest is clear anteriorly  Neuro status reveals a patient with eyes open, unable to focus and unable to follow commands. Moving both sides of his body.  ASSESSMENT:   1. Anterior ST elevation myocardial infarction, late presenting and complicated by out-of-hospital cardiac arrest. Treated with drug-eluting stent to the left anterior descending. 2. Acute systolic heart failure with initial ejection fraction of 25-30% and subsequent documentation of dramatic improvement to 50% by echo on yesterday. 3. Anoxic encephalopathy, but encouraged as patient is awake and moving all extremities. 4. Blood pressures are soft but adequate. 5. Leukocytosis has significantly improved. 6. Kidney function is stable.   Plan: 1. Continue dual antiplatelet therapy, per NG tube.  2. Continue to wean vent support as tolerated 3. Follow neurological status  4. As tolerated by blood pressure, low dose beta blocker therapy is appropriate. Statin therapy per NG tube is appropriate. 5. Agree with IV Lasix for diuresis as chest x-ray this morning is still wet. Demetrios Isaacs 11/15/2015, 8:23  AM

## 2015-11-15 NOTE — Progress Notes (Addendum)
PULMONARY / CRITICAL CARE MEDICINE   Name: [name] MRN: [id number] DOB: 30-Sep-1946    ADMISSION DATE:  11/11/2015 CONSULTATION DATE:  11/11/2015  REFERRING MD:  Dr. Burt Knack  CHIEF COMPLAINT:  Cardiac arrest  HISTORY OF PRESENT ILLNESS:  69 year old male with PMH as below, which is significant for RA and HTN. He presented to [facility] ED 11/29 after a witnessed cardiac arrest at home. He has had intermittent chest and arm pain for about 2 weeks status post mechanical fall. 11/29 chest pain acutely worsened and he unfortunately suffered a cardiac arrest, which was witnessed by his wife and EMS was called. Family initiated CPR. EMS arrived and AED was placed advising shock. 2 shocks were delivered and ROSC was achieved. 2 rounds epinephrine also administerred. EKG in the field was concerning for STEMI and code STEMI was called. In ED patient was unresponsive, he was intubated and taken emergently to cardiac cath lab where he remains at this time. Occlusive LAD lesion identified and intervened upon. PCCM consulted for ICU care. Total downtime estimated at less than 15 mins.   SUBJECTIVE: Rewarmed. No distress. Sedated.  REVIEW OF SYSTEMS:  Unable to assess as patient is intubated & sedated.  VITAL SIGNS: BP 91/54 mmHg  Pulse 85  Temp(Src) 98.8 F (37.1 C) (Core (Comment))  Resp 20  Ht 5\' 7"  (1.702 m)  Wt 191 lb 12.8 oz (87 kg)  BMI 30.03 kg/m2  SpO2 100%  HEMODYNAMICS: CVP:  [7 mmHg-11 mmHg] 7 mmHg  VENTILATOR SETTINGS: Vent Mode:  [-] PRVC FiO2 (%):  [40 %] 40 % Set Rate:  [20 bmp] 20 bmp Vt Set:  [550 mL] 550 mL PEEP:  [5 cmH20] 5 cmH20 Pressure Support:  [5 cmH20] 5 cmH20 Plateau Pressure:  [11 cmH20-16 cmH20] 16 cmH20  INTAKE / OUTPUT: I/O last 3 completed shifts: In: 4831.7 [I.V.:1456.7; NG/GT:2620; IV Piggyback:755] Out: [id number] [Urine:5935]  PHYSICAL EXAMINATION: General: Elderly male. Family at bedside. Sedated. Neuro:  PERRL. Sedated. HEENT: ETT in  place. No scleral injection or icterus. Cardiovascular:  Regular rate. No edema. Lungs: Coarse breath sounds bilaterally on ventilator. Symmetric chest rise. Abdomen:  Soft. Normal bowel sounds. Nondistended.  LABS:  CBC  Recent Labs Lab 11/13/15 0500  11/13/15 1628 11/14/15 0400 11/15/15 0420  WBC 28.6*  --   --  14.3* 12.5*  HGB 16.0  < > 14.3 12.0* 11.7*  HCT 43.5  < > 42.0 35.2* 33.7*  PLT 238  --   --  150 137*  < > = values in this interval not displayed. Coag's  Recent Labs Lab 11/12/15 0015 11/12/15 0932  APTT 75* 24  INR 1.19 1.11   BMET  Recent Labs Lab 11/13/15 0600  11/13/15 1628 11/14/15 0400 11/15/15 0420  NA 134*  < > 138 137 138  K 4.2  < > 4.9 4.2 3.8  CL 108  < > 107 107 102  CO2 18*  --   --  23 28  BUN 19  < > 20 17 29*  CREATININE 0.97  < > 0.90 0.93 0.85  GLUCOSE 209*  < > 153* 181* 97  < > = values in this interval not displayed. Electrolytes  Recent Labs Lab 11/13/15 0500 11/13/15 0600 11/14/15 0400 11/15/15 0420  CALCIUM 8.4* 8.2* 8.0* 8.3*  MG 2.3  --  1.7 2.3  PHOS 2.2*  --  2.1* 3.1   Sepsis Markers  Recent Labs Lab 11/12/15 1300 11/13/15 11/13/15 0500 11/14/15 0400  LATICACIDVEN  --  1.5  --   --   PROCALCITON 0.22  --  0.20 0.13   ABG  Recent Labs Lab 11/12/15 0330 11/13/15 0305 11/13/15 1849  PHART 7.391 7.416 7.309*  PCO2ART 31.2* 25.4* 38.2  PO2ART 164* 70.7* 95.0   Liver Enzymes  Recent Labs Lab 11/13/15 0500 11/14/15 0400 11/15/15 0420  AST 118* 39 133*  ALT 158* 80* 78*  ALKPHOS 62 53 56  BILITOT 0.7 0.4 0.8  ALBUMIN 3.2* 2.5* 2.6*   Cardiac Enzymes  Recent Labs Lab 11/12/15 0804 11/12/15 1138 11/12/15 1800  TROPONINI 26.88* 12.23* 8.53*   Glucose  Recent Labs Lab 11/14/15 2021 11/15/15 0052 11/15/15 0346 11/15/15 0755 11/15/15 1221 11/15/15 1610  GLUCAP 125* 194* 127* 111* 106* 99    Imaging Dg Chest Port 1 View  11/15/2015  CLINICAL DATA:  Pulmonary edema.  Recent  cardiac catheterization. EXAM: PORTABLE CHEST 1 VIEW COMPARISON:  11/14/2015 and 11/13/2015. FINDINGS: 0524 hours. Endotracheal tube is unchanged, approximately 3 cm above the carina. Left IJ central venous catheter and nasogastric tube are unchanged. The heart size and mediastinal contours are stable. Bibasilar airspace opacities have not significantly changed. There is no pneumothorax or significant pleural effusion. IMPRESSION: Stable chest with bibasilar airspace opacities. Stable support system. Electronically Signed   By: Richardean Sale M.D.   On: 11/15/2015 09:14     STUDIES:  LHC 11/29 - stent lad CT head 11/29 - no acute abnormality eeg 11/30 - no seizure activity. suppression Port CXR 12/3 - Bilat lower lung opacity.  CULTURES: n/a  ANTIBIOTICS: Levaquin 11/30>>>  SIGNIFICANT EVENTS: 11/29 cardiac arrest > cath lab, out to ICU, therapeutic hypothermia.  12/02 completed rewarming  LINES/TUBES: ETT 11/29 >>> CVL 11/29 >>> Left radial 11/29 -12/3   ASSESSMENT / PLAN:  PULMONARY A: Acute Hypoxic Respiratory Failure Severe CAP vs Aspiration Pneumonia   P:   Full vent support overnight SBT in AM  CARDIOVASCULAR A:  Cardiac arrest - VT/VF STEMI s/p PCI 11/29 HTN  P:  Care per Cardiology Wyn Quaker, Lopressor & Lipitor Lasix IV q12hr  RENAL A:   Hypophosphatemia - Resolved. Hypomagnesemia - Resolved.  P:   Monitoring UOP Trending renal function with daily BUN/Creatinine Monitoring daily electrolytes  GASTROINTESTINAL A:   Transaminitis - Shock liver. Resolving.  P:   NPO Pepcid IV q12hr Tube feeds at goal  HEMATOLOGIC A:   Leukocytosis - Improving. Suspect stress response.  P:  Follow CBC Antiplatelet/anticoagulation per cardiology Heparin [place] q8hr  INFECTIOUS A:   Possible CAP Higher risk for infection from cooling  P:   Levofloxacin daily (plan stop date 12/4) Monitoring for fever  ENDOCRINE A:   Hyperglycemia - No H/O  DM.  P:   Accu-Checks q4hr SSI coverage D/C hydrocortisone  NEUROLOGIC A:   Concern for Anoxic Brain Injury - EEG negative. Following commands earlier.  P:   RASS goal:  0 to -1  Fentanyl gtt for analgesia Versed gtt for deep sedation Propofol gtt  FAMILY  - Updates: Mother and children at bedside this evening.  - Inter-disciplinary family meet or Palliative Care meeting due by:  12/7    TODAY'S SUMMARY:  69 year old male post arrest. S/P rewarming & following commands earlier per nurse. Plan for SBT in the morning.  I have spent a total of 32 minutes of critical care time today caring for the patient, updating the patient's family at bedside, and reviewing his electronic medical record.  Sonia Baller Arvind Mexicano,  M.D. [facility] Pager:  [phone] After 3pm or if no response, call [phone] 11/15/2015 7:23 PM

## 2015-11-16 DIAGNOSIS — R410 Disorientation, unspecified: Secondary | ICD-10-CM

## 2015-11-16 DIAGNOSIS — M069 Rheumatoid arthritis, unspecified: Secondary | ICD-10-CM

## 2015-11-16 LAB — RENAL FUNCTION PANEL
ANION GAP: 8 (ref 5–15)
Albumin: 2.3 g/dL — ABNORMAL LOW (ref 3.5–5.0)
BUN: 35 mg/dL — ABNORMAL HIGH (ref 6–20)
CHLORIDE: 100 mmol/L — AB (ref 101–111)
CO2: 33 mmol/L — AB (ref 22–32)
Calcium: 8.4 mg/dL — ABNORMAL LOW (ref 8.9–10.3)
Creatinine, Ser: 1.06 mg/dL (ref 0.61–1.24)
GFR calc non Af Amer: >60 mL/min (ref >60)
GLUCOSE: 117 mg/dL — AB (ref 65–99)
Phosphorus: 3.3 mg/dL (ref 2.5–4.6)
Potassium: 3 mmol/L — ABNORMAL LOW (ref 3.5–5.1)
Sodium: 141 mmol/L (ref 135–145)

## 2015-11-16 LAB — GLUCOSE, CAPILLARY
GLUCOSE-CAPILLARY: 132 mg/dL — AB (ref 65–99)
GLUCOSE-CAPILLARY: 134 mg/dL — AB (ref 65–99)
GLUCOSE-CAPILLARY: 136 mg/dL — AB (ref 65–99)
Glucose-Capillary: 121 mg/dL — ABNORMAL HIGH (ref 65–99)
Glucose-Capillary: 104 mg/dL — ABNORMAL HIGH (ref 65–99)
Glucose-Capillary: 136 mg/dL — ABNORMAL HIGH (ref 65–99)
Glucose-Capillary: 112 mg/dL — ABNORMAL HIGH (ref 65–99)

## 2015-11-16 LAB — MAGNESIUM: Magnesium: 2 mg/dL (ref 1.7–2.4)

## 2015-11-16 LAB — TRIGLYCERIDES: TRIGLYCERIDES: 235 mg/dL — AB (ref <150)

## 2015-11-16 LAB — CBC WITH DIFFERENTIAL/PLATELET
Basophils Absolute: 0 10*3/uL (ref 0.0–0.1)
Basophils Relative: 0 %
Eosinophils Absolute: 0 10*3/uL (ref 0.0–0.7)
Eosinophils Relative: 0 %
HEMATOCRIT: 31.7 % — AB (ref 39.0–52.0)
HEMOGLOBIN: 10.8 g/dL — AB (ref 13.0–17.0)
LYMPHS ABS: 1.3 10*3/uL (ref 0.7–4.0)
LYMPHS PCT: 12 %
MCH: 31.1 pg (ref 26.0–34.0)
MCHC: 34.1 g/dL (ref 30.0–36.0)
MCV: 91.4 fL (ref 78.0–100.0)
MONO ABS: 1 10*3/uL (ref 0.1–1.0)
MONOS PCT: 9 %
NEUTROS ABS: 8.2 10*3/uL — AB (ref 1.7–7.7)
NEUTROS PCT: 79 %
Platelets: 156 10*3/uL (ref 150–400)
RBC: 3.47 MIL/uL — ABNORMAL LOW (ref 4.22–5.81)
RDW: 14.8 % (ref 11.5–15.5)
WBC: 10.5 10*3/uL (ref 4.0–10.5)

## 2015-11-16 MED ORDER — HALOPERIDOL LACTATE 5 MG/ML IJ SOLN
5.0000 mg | Freq: Four times a day (QID) | INTRAMUSCULAR | Status: DC | PRN
  Administered 2015-11-16 – 2015-11-17 (×3): 5 mg via INTRAVENOUS
  Filled 2015-11-16 (×3): qty 1

## 2015-11-16 MED ORDER — POTASSIUM CHLORIDE 10 MEQ/50ML IV SOLN
10.0000 meq | INTRAVENOUS | Status: AC
  Administered 2015-11-16 (×3): 10 meq via INTRAVENOUS
  Filled 2015-11-16 (×3): qty 50

## 2015-11-16 MED ORDER — DEXMEDETOMIDINE HCL IN NACL 200 MCG/50ML IV SOLN
0.4000 ug/kg/h | INTRAVENOUS | Status: DC
  Administered 2015-11-16: 0.9 ug/kg/h via INTRAVENOUS
  Administered 2015-11-16: 0.7 ug/kg/h via INTRAVENOUS
  Administered 2015-11-16: 0.4 ug/kg/h via INTRAVENOUS
  Administered 2015-11-16: 1 ug/kg/h via INTRAVENOUS
  Administered 2015-11-16 – 2015-11-17 (×5): 1.2 ug/kg/h via INTRAVENOUS
  Filled 2015-11-16 (×10): qty 50

## 2015-11-16 NOTE — Progress Notes (Signed)
       Patient Name: [name] Date of Encounter: 11/16/2015    SUBJECTIVE: Responds to verbal stimuli but without purposeful movement. He will gazing your direction but unable to focus.  TELEMETRY:  Normal sinus rhythm/sinus tachycardia Filed Vitals:   11/16/15 0400 11/16/15 0500 11/16/15 0600 11/16/15 0700  BP: 97/54 88/51 86/51  90/51  Pulse: 100 98 98 96  Temp: 100.5 F (38.1 C)  100.1 F (37.8 C)   TempSrc: Axillary  Oral   Resp: 20 19 18 20   Height:      Weight:      SpO2: 96% 96% 95% 97%    Intake/Output Summary (Last 24 hours) at 11/16/15 0731 Last data filed at 11/16/15 0700  Gross per 24 hour  Intake 3138.35 ml  Output   3015 ml  Net 123.35 ml   LABS: Basic Metabolic Panel:  Recent Labs  11/15/15 0420 11/16/15 0515  NA 138 141  K 3.8 3.0*  CL 102 100*  CO2 28 33*  GLUCOSE 97 117*  BUN 29* 35*  CREATININE 0.85 1.06  CALCIUM 8.3* 8.4*  MG 2.3 2.0  PHOS 3.1 3.3   CBC:  Recent Labs  11/15/15 0420 11/16/15 0515  WBC 12.5* 10.5  NEUTROABS 11.2* 8.2*  HGB 11.7* 10.8*  HCT 33.7* 31.7*  MCV 90.6 91.4  PLT 137* 156   Cardiac Enzymes: No results for input(s): CKTOTAL, CKMB, CKMBINDEX, TROPONINI in the last 72 hours. BNP: Invalid input(s): POCBNP Hemoglobin A1C: No results for input(s): HGBA1C in the last 72 hours. Fasting Lipid Panel:  Recent Labs  11/16/15 0515  TRIG 235*    Radiology/Studies:  Chest x-ray yesterday revealed bibasal space opacities but otherwise unremarkable  Physical Exam: Blood pressure 90/51, pulse 96, temperature 100.1 F (37.8 C), temperature source Oral, resp. rate 20, height 5\' 7"  (1.702 m), weight 193 lb 5.5 oz (87.7 kg), SpO2 97 %. Weight change: 1 lb 8.7 oz (0.7 kg)  Wt Readings from Last 3 Encounters:  11/16/15 193 lb 5.5 oz (87.7 kg)  10/03/15 187 lb (84.823 kg)  07/31/15 183 lb (83.008 kg)    Regular rate and rhythm without rub or gallop Chest is clear anteriorly Neuro status is impaired  however patient is arousable and moves all extremities.  ASSESSMENT:  1. Cardiac arrest secondary to acute ischemia in setting of anterior myocardial infarction. 2. Anoxic brain injury 3. Acute systolic heart failure, with dramatic improvement in LV function 4. Hypokalemia  Plan:  Replete potassium Continue current therapy for underlying vascular component which should include aspirin, Brilinta, high-dose statin therapy, and beta blocker therapy.  Demetrios Isaacs 11/16/2015, 7:31 AM

## 2015-11-16 NOTE — Progress Notes (Signed)
Pt aggitated, attempting to crawl out of bed, biting down on and reaching for ETT tube while on 1.2 mcg/kg/hr of precedex and 382mcg/hr of fentanyl. Elink made aware and ordered haldol ordered. EKG performed prior to administering dose of haldol to ensure QTc < 0.5s.    Paulene Floor, RN 11/16/2015

## 2015-11-16 NOTE — Progress Notes (Signed)
Patient external urinary catheter removed at midnight. He was having very little output. Propofol at 38mcg, fentanyl at 268mcg. Bladder scan showed 613 ml. Elink MD called. Order for foley insertion. Proper sterile technique used. Pt immediately put out 625 ml of urine.

## 2015-11-16 NOTE — Progress Notes (Signed)
PULMONARY / CRITICAL CARE MEDICINE   Name: [name] MRN: [id number] DOB: Feb 15, 1946    ADMISSION DATE:  11/11/2015 CONSULTATION DATE:  11/11/2015  REFERRING MD:  Dr. Burt Knack  CHIEF COMPLAINT:  Cardiac arrest  HISTORY OF PRESENT ILLNESS:  69 year old male with PMH of RA and HTN. He presented to [facility] ED 11/29 after a witnessed cardiac arrest at home. He has had intermittent chest and arm pain for about 2 weeks status post mechanical fall. 11/29 chest pain acutely worsened and he unfortunately suffered a cardiac arrest, which was witnessed by his wife and EMS was called. Family initiated CPR. EMS arrived and AED was placed advising shock. 2 shocks were delivered and ROSC was achieved. 2 rounds epinephrine also administerred. EKG in the field was concerning for STEMI and code STEMI was called. In ED patient was unresponsive, he was intubated and taken emergently to cardiac cath lab where he remains at this time. Occlusive LAD lesion identified and intervened upon. PCCM consulted for ICU care. Total downtime estimated at less than 15 mins.   SUBJECTIVE: RN reports hypokalemia, K replaced by Warren Lacy.  In process of WUA, no SBT as of yet due to sedation.    VITAL SIGNS: BP 91/54 mmHg  Pulse 93  Temp(Src) 100.1 F (37.8 C) (Oral)  Resp 20  Ht 5\' 7"  (1.702 m)  Wt 193 lb 5.5 oz (87.7 kg)  BMI 30.27 kg/m2  SpO2 97%  HEMODYNAMICS: CVP:  [5 mmHg-8 mmHg] 6 mmHg  VENTILATOR SETTINGS: Vent Mode:  [-] PRVC FiO2 (%):  [40 %] 40 % Set Rate:  [20 bmp] 20 bmp Vt Set:  [550 mL] 550 mL PEEP:  [5 cmH20] 5 cmH20 Pressure Support:  [5 cmH20] 5 cmH20 Plateau Pressure:  [11 cmH20-17 cmH20] 16 cmH20  INTAKE / OUTPUT: I/O last 3 completed shifts: In: 4512.6 [I.V.:1968.6; GX:[id number]; IV Piggyback:300] Out: 5310 [Urine:5310]  PHYSICAL EXAMINATION: General: wdwn older male in NAD on vent  Neuro:  PERRL. Sedated. Opens eyes toward voice HEENT: ETT in place. No scleral injection or  icterus. Cardiovascular:  Regular rate, tachy. No edema. Lungs: Coarse breath sounds bilaterally on ventilator. Symmetric chest rise. Abdomen:  Soft. Normal bowel sounds. Nondistended. Skin:  Warm/dry, small abrasion on R elbow, no rashes or lesions  LABS:  CBC  Recent Labs Lab 11/14/15 0400 11/15/15 0420 11/16/15 0515  WBC 14.3* 12.5* 10.5  HGB 12.0* 11.7* 10.8*  HCT 35.2* 33.7* 31.7*  PLT 150 137* 156   Coag's  Recent Labs Lab 11/12/15 0015 11/12/15 0932  APTT 75* 24  INR 1.19 1.11   BMET  Recent Labs Lab 11/14/15 0400 11/15/15 0420 11/16/15 0515  NA 137 138 141  K 4.2 3.8 3.0*  CL 107 102 100*  CO2 23 28 33*  BUN 17 29* 35*  CREATININE 0.93 0.85 1.06  GLUCOSE 181* 97 117*   Electrolytes  Recent Labs Lab 11/14/15 0400 11/15/15 0420 11/16/15 0515  CALCIUM 8.0* 8.3* 8.4*  MG 1.7 2.3 2.0  PHOS 2.1* 3.1 3.3   Sepsis Markers  Recent Labs Lab 11/12/15 1300 11/13/15 11/13/15 0500 11/14/15 0400  LATICACIDVEN  --  1.5  --   --   PROCALCITON 0.22  --  0.20 0.13   ABG  Recent Labs Lab 11/12/15 0330 11/13/15 0305 11/13/15 1849  PHART 7.391 7.416 7.309*  PCO2ART 31.2* 25.4* 38.2  PO2ART 164* 70.7* 95.0   Liver Enzymes  Recent Labs Lab 11/13/15 0500 11/14/15 0400 11/15/15 0420 11/16/15 0515  AST 118* 39 133*  --   ALT 158* 80* 78*  --   ALKPHOS 62 53 56  --   BILITOT 0.7 0.4 0.8  --   ALBUMIN 3.2* 2.5* 2.6* 2.3*   Cardiac Enzymes  Recent Labs Lab 11/12/15 0804 11/12/15 1138 11/12/15 1800  TROPONINI 26.88* 12.23* 8.53*   Glucose  Recent Labs Lab 11/15/15 0755 11/15/15 1221 11/15/15 1610 11/15/15 1951 11/15/15 2341 11/16/15 0344  GLUCAP 111* 106* 99 121* 132* 104*    Imaging No results found.   STUDIES:  11-12-23  LHC - stent lad 12-Nov-2023  CT head - no acute abnormality 11/30  eeg - no seizure activity. suppression 12/03  Port CXR 12/3 - Bilat lower lung opacity. 12/04  Rewarmed    CULTURES:   ANTIBIOTICS: Levaquin 11/30 >>  SIGNIFICANT EVENTS: Nov 12, 2023 cardiac arrest > cath lab, out to ICU, therapeutic hypothermia.  12/02 completed rewarming  LINES/TUBES: ETT 2023/11/12 >> CVL 11-12-2023 >> Left radial 2023-11-12 >> 12/3   ASSESSMENT / PLAN:  PULMONARY A: Acute Hypoxic Respiratory Failure Severe CAP vs Aspiration Pneumonia   P:   MV support, 8 cc/kg Wean PEEP / FiO2 for sats > 92% Daily SBT / WUA  Intermittent CXR  VAP preventions measures   CARDIOVASCULAR A:  Cardiac arrest - VT/VF STEMI s/p PCI November 12, 2023 HTN  P:  Care per Cardiology Wyn Quaker, Lopressor & Lipitor ICU monitoring of hemodynamics  Follow triglycerides while on propofol   RENAL A:   Hypokalemia  Hypophosphatemia - Resolved. Hypomagnesemia - Resolved.  P:   Monitor UOP / BMP Trending renal function with daily BUN/Creatinine Replace electrolytes as indicated, kcl 12/4  GASTROINTESTINAL A:   Transaminitis - Shock liver. Resolving. At Risk Protein Calorie Malnutrition  P:   NPO Pepcid IV q12hr Tube feeds at goal  HEMATOLOGIC A:   Leukocytosis - Improving. Suspect stress response. Anemia  P:  Follow CBC Antiplatelet / anticoagulation per Cardiology Heparin Lyle q8hr for DVT prophylaxis   INFECTIOUS A:   Possible CAP Higher risk for infection from cooling  P:   Levofloxacin daily (plan stop date 12/4) Monitor fever curve / WBC  ENDOCRINE A:   Hyperglycemia - No H/O DM.  P:   Accu-Checks q4hr SSI coverage  AUTOIMMUNE:  A:  Rheumatoid Arthritis - on DMARD & Methotrexate at baseline   P:  Hold home medications  NEUROLOGIC A:   Concern for Anoxic Brain Injury - EEG negative. Following commands 12/3.  P:   RASS goal:  0 to -1  Fentanyl gtt for analgesia Propofol gtt Daily WUA / SBT Minimize sedation as able   FAMILY  - Updates: No family at bedside am 12/3.  - Inter-disciplinary family meet or Palliative Care meeting due by:  12/7    TODAY'S  SUMMARY:  69 year old male post arrest. S/P rewarming.  Minimize sedation, hopeful for extubation soon.   [name], NP-C [facility] Pgr: [phone] or if no answer [phone] 11/16/2015, 8:02 AM

## 2015-11-16 NOTE — Progress Notes (Signed)
eLink Physician-Brief Progress Note Patient Name: AYDIN BANO DOB: 01-18-1946 MRN: [id number]   Date of Service  11/16/2015  HPI/Events of Note  Agitation on precedex and fentanyl. ICU delirium  eICU Interventions  Haldol PRN. Follow qTC     Intervention Category Intermediate Interventions: Other:  Santana Edell 11/16/2015, 10:06 PM

## 2015-11-17 ENCOUNTER — Inpatient Hospital Stay (HOSPITAL_COMMUNITY): Payer: BLUE CROSS/BLUE SHIELD

## 2015-11-17 LAB — GLUCOSE, CAPILLARY
GLUCOSE-CAPILLARY: 156 mg/dL — AB (ref 65–99)
GLUCOSE-CAPILLARY: 151 mg/dL — AB (ref 65–99)
GLUCOSE-CAPILLARY: 141 mg/dL — AB (ref 65–99)
GLUCOSE-CAPILLARY: 153 mg/dL — AB (ref 65–99)
GLUCOSE-CAPILLARY: 167 mg/dL — AB (ref 65–99)
Glucose-Capillary: 140 mg/dL — ABNORMAL HIGH (ref 65–99)
Glucose-Capillary: 154 mg/dL — ABNORMAL HIGH (ref 65–99)

## 2015-11-17 LAB — CBC WITH DIFFERENTIAL/PLATELET
BASOS ABS: 0 10*3/uL (ref 0.0–0.1)
Basophils Relative: 0 %
EOS ABS: 0 10*3/uL (ref 0.0–0.7)
Eosinophils Relative: 0 %
HCT: 29.9 % — ABNORMAL LOW (ref 39.0–52.0)
HEMOGLOBIN: 10.5 g/dL — AB (ref 13.0–17.0)
LYMPHS ABS: 1.1 10*3/uL (ref 0.7–4.0)
LYMPHS PCT: 11 %
MCH: 31.9 pg (ref 26.0–34.0)
MCHC: 35.1 g/dL (ref 30.0–36.0)
MCV: 90.9 fL (ref 78.0–100.0)
Monocytes Absolute: 1.2 10*3/uL — ABNORMAL HIGH (ref 0.1–1.0)
Monocytes Relative: 12 %
NEUTROS PCT: 77 %
Neutro Abs: 7.6 10*3/uL (ref 1.7–7.7)
Platelets: 167 10*3/uL (ref 150–400)
RBC: 3.29 MIL/uL — AB (ref 4.22–5.81)
RDW: 14.8 % (ref 11.5–15.5)
WBC: 9.9 10*3/uL (ref 4.0–10.5)

## 2015-11-17 LAB — RENAL FUNCTION PANEL
ALBUMIN: 2.3 g/dL — AB (ref 3.5–5.0)
Anion gap: 7 (ref 5–15)
BUN: 29 mg/dL — ABNORMAL HIGH (ref 6–20)
CALCIUM: 8.2 mg/dL — AB (ref 8.9–10.3)
CHLORIDE: 104 mmol/L (ref 101–111)
CO2: 30 mmol/L (ref 22–32)
CREATININE: 0.97 mg/dL (ref 0.61–1.24)
Glucose, Bld: 153 mg/dL — ABNORMAL HIGH (ref 65–99)
PHOSPHORUS: 2.6 mg/dL (ref 2.5–4.6)
Potassium: 3 mmol/L — ABNORMAL LOW (ref 3.5–5.1)
Sodium: 141 mmol/L (ref 135–145)

## 2015-11-17 LAB — MAGNESIUM: MAGNESIUM: 2.1 mg/dL (ref 1.7–2.4)

## 2015-11-17 LAB — TRIGLYCERIDES: TRIGLYCERIDES: 184 mg/dL — AB (ref <150)

## 2015-11-17 MED ORDER — POTASSIUM CHLORIDE 20 MEQ/15ML (10%) PO SOLN
30.0000 meq | ORAL | Status: AC
  Administered 2015-11-17 (×2): 30 meq
  Filled 2015-11-17 (×2): qty 30

## 2015-11-17 MED ORDER — HALOPERIDOL LACTATE 5 MG/ML IJ SOLN
5.0000 mg | INTRAMUSCULAR | Status: DC
  Administered 2015-11-17 – 2015-11-19 (×8): 5 mg via INTRAVENOUS
  Filled 2015-11-17 (×8): qty 1

## 2015-11-17 MED ORDER — FAMOTIDINE 40 MG/5ML PO SUSR
20.0000 mg | Freq: Two times a day (BID) | ORAL | Status: DC
  Administered 2015-11-17 – 2015-12-04 (×34): 20 mg via ORAL
  Filled 2015-11-17 (×36): qty 2.5

## 2015-11-17 MED ORDER — DEXMEDETOMIDINE HCL IN NACL 400 MCG/100ML IV SOLN
0.4000 ug/kg/h | INTRAVENOUS | Status: DC
Start: 2015-11-17 — End: 2015-11-22
  Administered 2015-11-17: 1.4 ug/kg/h via INTRAVENOUS
  Administered 2015-11-17: 1.2 ug/kg/h via INTRAVENOUS
  Administered 2015-11-17 – 2015-11-18 (×4): 1 ug/kg/h via INTRAVENOUS
  Administered 2015-11-19: 0.5 ug/kg/h via INTRAVENOUS
  Administered 2015-11-19 (×3): 1 ug/kg/h via INTRAVENOUS
  Administered 2015-11-20: 0.5 ug/kg/h via INTRAVENOUS
  Filled 2015-11-17 (×15): qty 100

## 2015-11-17 NOTE — Progress Notes (Addendum)
PULMONARY / CRITICAL CARE MEDICINE   Name: [name] MRN: [id number] DOB: Sep 12, 1946    ADMISSION DATE:  11/11/2015 CONSULTATION DATE:  11/11/2015  REFERRING MD:  Dr. Burt Knack  CHIEF COMPLAINT:  Cardiac arrest  HISTORY OF PRESENT ILLNESS:  69 year old male with PMH of RA and HTN. He presented to [facility] ED 11/29 after a witnessed cardiac arrest at home. He has had intermittent chest and arm pain for about 2 weeks status post mechanical fall. 11/29 chest pain acutely worsened and he unfortunately suffered a cardiac arrest, which was witnessed by his wife and EMS was called. Family initiated CPR. EMS arrived and AED was placed advising shock. 2 shocks were delivered and ROSC was achieved. 2 rounds epinephrine also administerred. EKG in the field was concerning for STEMI and code STEMI was called. In ED patient was unresponsive, he was intubated and taken emergently to cardiac cath lab where he remains at this time. Occlusive LAD lesion identified and intervened upon. PCCM consulted for ICU care. Total downtime estimated at less than 15 mins.   SUBJECTIVE: Hypokalemia repleted. Severe agitation on Precedex and fentanyl. Started on Haldol  VITAL SIGNS: BP 100/64 mmHg  Pulse 86  Temp(Src) 98.3 F (36.8 C) (Axillary)  Resp 19  Ht 5\' 7"  (1.702 m)  Wt 197 lb 5 oz (89.5 kg)  BMI 30.90 kg/m2  SpO2 91%  HEMODYNAMICS: CVP:  [8 mmHg-12 mmHg] 12 mmHg  VENTILATOR SETTINGS: Vent Mode:  [-] PRVC FiO2 (%):  [40 %-60 %] 60 % Set Rate:  [20 bmp] 20 bmp Vt Set:  [550 mL] 550 mL PEEP:  [5 cmH20] 5 cmH20 Plateau Pressure:  [14 cmH20-18 cmH20] 15 cmH20  INTAKE / OUTPUT: I/O last 3 completed shifts: In: 5175.1 [I.V.:2261.1; NG/GT:2314; IV [id number] Out: 2865 [Urine:2865]  PHYSICAL EXAMINATION: General:Sedated, agitated, uncomfortable  Neuro: Does not open eyes to command  HEENT:ET tube in place, moist mucous membranes  Cardiovascular: Sinus tachycardia, no murmurs rubs gallops   Lungs:Clear anteriorly  Abdomen: Soft, positive bowel sounds  Skin: Intact   LABS:  CBC  Recent Labs Lab 11/15/15 0420 11/16/15 0515 11/17/15 0520  WBC 12.5* 10.5 9.9  HGB 11.7* 10.8* 10.5*  HCT 33.7* 31.7* 29.9*  PLT 137* 156 167   Coag's  Recent Labs Lab 11/12/15 0015 11/12/15 0932  APTT 75* 24  INR 1.19 1.11   BMET  Recent Labs Lab 11/15/15 0420 11/16/15 0515 11/17/15 0520  NA 138 141 141  K 3.8 3.0* 3.0*  CL 102 100* 104  CO2 28 33* 30  BUN 29* 35* 29*  CREATININE 0.85 1.06 0.97  GLUCOSE 97 117* 153*   Electrolytes  Recent Labs Lab 11/15/15 0420 11/16/15 0515 11/17/15 0520  CALCIUM 8.3* 8.4* 8.2*  MG 2.3 2.0 2.1  PHOS 3.1 3.3 2.6   Sepsis Markers  Recent Labs Lab 11/12/15 1300 11/13/15 11/13/15 0500 11/14/15 0400  LATICACIDVEN  --  1.5  --   --   PROCALCITON 0.22  --  0.20 0.13   ABG  Recent Labs Lab 11/12/15 0330 11/13/15 0305 11/13/15 1849  PHART 7.391 7.416 7.309*  PCO2ART 31.2* 25.4* 38.2  PO2ART 164* 70.7* 95.0   Liver Enzymes  Recent Labs Lab 11/13/15 0500 11/14/15 0400 11/15/15 0420 11/16/15 0515 11/17/15 0520  AST 118* 39 133*  --   --   ALT 158* 80* 78*  --   --   ALKPHOS 62 53 56  --   --   BILITOT 0.7 0.4  0.8  --   --   ALBUMIN 3.2* 2.5* 2.6* 2.3* 2.3*   Cardiac Enzymes  Recent Labs Lab 11/12/15 0804 11/12/15 1138 11/12/15 1800  TROPONINI 26.88* 12.23* 8.53*   Glucose  Recent Labs Lab 11/16/15 1220 11/16/15 1632 11/16/15 2009 11/17/15 0004 11/17/15 0322 11/17/15 0740  GLUCAP 136* 134* 112* 154* 156* 151*    Imaging Dg Chest Port 1 View  11/17/2015  CLINICAL DATA:  Acute respiratory failure, CHF, and coronary artery disease EXAM: PORTABLE CHEST 1 VIEW COMPARISON:  Portable chest x-ray of November 15, 2015 FINDINGS: The lungs are adequately inflated. There has been further development of alveolar opacity in the left mid and lower lung. The retrocardiac region is opacified and the left  hemidiaphragm is largely obscured. There is persistent alveolar opacity in the right infrahilar region. The heart is mildly enlarged. The pulmonary vascularity is mildly engorged. The endotracheal tube tip projects 4.9 cm above the carina. The esophagogastric tube tip projects below the inferior margin of the image. The left internal jugular venous catheter tip projects over the midportion of the SVC. IMPRESSION: Slight interval worsening of alveolar opacities on the left compatible with pneumonia or less likely pulmonary edema. Stable subsegmental atelectasis or early infiltrate in the right infrahilar region. Low-grade CHF. Electronically Signed   By: David  Martinique M.D.   On: 11/17/2015 07:11     STUDIES:  03-Dec-2023  LHC - stent lad 03-Dec-2023  CT head - no acute abnormality 11/30  eeg - no seizure activity. suppression 12/03  Port CXR 12/3 - Bilat lower lung opacity. 12/04  Rewarmed   CULTURES:   ANTIBIOTICS: Levaquin 11/30 >> 12/4  SIGNIFICANT EVENTS: 12/03/2023 cardiac arrest > cath lab, out to ICU, therapeutic hypothermia.  12/02 completed rewarming  LINES/TUBES: ETT 2023/12/03 >>  CVL 03-Dec-2023 >> Left radial 03-Dec-2023 >> 12/3   ASSESSMENT / PLAN:  PULMONARY A: Acute Hypoxic Respiratory Failure Severe CAP vs Aspiration Pneumonia   P:   MV support, 8 cc/kg Wean PEEP / FiO2 for sats > 92% Daily SBT / WUA as tolerated. ICU delirium is a significant barrier to progress  Intermittent CXR  VAP preventions measures   CARDIOVASCULAR A:  Cardiac arrest - VT/VF STEMI s/p PCI 12-03-2023 HTN  P:  Care per Cardiology Wyn Quaker, Lopressor & Lipitor ICU monitoring of hemodynamics  Follow triglycerides while on propofol   RENAL A:   Hypokalemia  Hypophosphatemia - Resolved. Hypomagnesemia - Resolved.  P:   Monitor UOP / BMP Renal function is stable  Potassium repleted today  GASTROINTESTINAL A:   Transaminitis - Shock liver. Resolving. At Risk Protein Calorie Malnutrition  P:    NPO Pepcid IV q12hr Tube feeds at goal  HEMATOLOGIC A:   Leukocytosis - Improving. Suspect stress response. Anemia  P:  Follow CBC Antiplatelet / anticoagulation per Cardiology Heparin Grover q8hr for DVT prophylaxis   INFECTIOUS A:   Possible CAP Higher risk for infection from cooling  P:   Finished a course of levofloxacin  Monitor fever curve / WBC  ENDOCRINE A:   Hyperglycemia - No H/O DM.  P:   Accu-Checks q4hr SSI coverage  AUTOIMMUNE:  A:  Rheumatoid Arthritis - on DMARD & Methotrexate at baseline   P:  Hold home medications  NEUROLOGIC A:   Concern for Anoxic Brain Injury - EEG negative. Following commands 12/3.  P:   RASS goal:  0 to -1  On max doses of Precedex, fentanyl. Start Haldol. Monitor QTC  He may  need to go back on the propofol  Daily WUA / SBT Minimize sedation as able   FAMILY  - Updates: No family at bedside am 12/5 - Inter-disciplinary family meet or Palliative Care meeting due by:  12/7    TODAY'S SUMMARY:  69 year old male post arrest. S/P rewarming. Difficulty with agitation, sedation.  Critical care time-35 minutes  Marshell Garfinkel MD [facility] Pager [phone] If no answer or after 3pm call: [phone] 11/17/2015, 9:52 AM

## 2015-11-17 NOTE — Progress Notes (Signed)
RT note-fio2 increased to 60% due to agitation period prior, continue to monitor and wean as tolerated.

## 2015-11-17 NOTE — Progress Notes (Signed)
Patient Name: [name] Date of Encounter: 11/17/2015  Principal Problem:   Cardiac arrest with ventricular fibrillation ([place]) Active Problems:   ST elevation (STEMI) myocardial infarction involving left anterior descending coronary artery ([name])   Rheumatoid arthritis ([place])   Acute respiratory failure with hypoxia ([name])   Shock circulatory ([name])   Coma ([place])   CAD S/P PCI OF pLAD with 3.5 mm x38 mm Promus DES (3.75 mm)   Cardiomyopathy, ischemic - EF 20-25% by LV Gram   Acute delirium   Primary Cardiologist: Dr Burt Knack  Patient Profile: 69 yo male w/ no previous cardiac hx, admitted 11/29 w/ OOH cardiac arrest in setting of anterior STEMI, s/p DES LAD, EF 25% at cath, 50% by echo.   SUBJECTIVE: Intubated and sedated. Restless last pm, sedation increased. Hypotensive, norepi restarted  OBJECTIVE Filed Vitals:   11/17/15 0400 11/17/15 0500 11/17/15 0600 11/17/15 0700  BP: 104/65 102/61 101/64   Pulse: 66 66 64 64  Temp: 97 F (36.1 C)     TempSrc: Axillary     Resp: 20 20 20 20   Height:      Weight:      SpO2: 96% 97% 98% 98%    Intake/Output Summary (Last 24 hours) at 11/17/15 0705 Last data filed at 11/17/15 0700  Gross per 24 hour  Intake 3671.95 ml  Output   1605 ml  Net 2066.95 ml   Filed Weights   11/15/15 0437 11/16/15 0200 11/17/15 0200  Weight: 191 lb 12.8 oz (87 kg) 193 lb 5.5 oz (87.7 kg) 197 lb 5 oz (89.5 kg)    PHYSICAL EXAM General: Well developed, well nourished, male in no acute distress. Head: Normocephalic, atraumatic.  Neck: Supple without bruits, JVD not elevated, difficult to assess 2nd lines & equipment. Lungs:  Resp regular and unlabored, CTA. Heart: RRR, S1, S2, no S3, S4, or murmur; no rub. Abdomen: Soft, non-tender, non-distended, BS + x 4.  Extremities: No clubbing, cyanosis, edema.  Neuro: sedated on the vent.   LABS: CBC:  Recent Labs  11/16/15 0515 11/17/15 0520  WBC 10.5 9.9  NEUTROABS 8.2* 7.6  HGB 10.8*  10.5*  HCT 31.7* 29.9*  MCV 91.4 90.9  PLT 156 [id number]   Basic Metabolic Panel:  Recent Labs  11/16/15 0515 11/17/15 0520  NA 141 141  K 3.0* 3.0*  CL 100* 104  CO2 33* 30  GLUCOSE 117* 153*  BUN 35* 29*  CREATININE 1.06 0.97  CALCIUM 8.4* 8.2*  MG 2.0 2.1  PHOS 3.3 2.6   Liver Function Tests:  Recent Labs  11/15/15 0420 11/16/15 0515 11/17/15 0520  AST 133*  --   --   ALT 78*  --   --   ALKPHOS 56  --   --   BILITOT 0.8  --   --   PROT 5.1*  --   --   ALBUMIN 2.6* 2.3* 2.3*   Fasting Lipid Panel:  Recent Labs  11/17/15 0520  TRIG 184*   TELE:  SR, ST, no sig ectopy      Radiology/Studies: CXR pending No results found.   Current Medications:  . antiseptic oral rinse  7 mL Mouth Rinse 10 times per day  . aspirin  81 mg Oral Daily  . atorvastatin  80 mg Oral q1800  . chlorhexidine gluconate  15 mL Mouth Rinse BID  . famotidine (PEPCID) IV  20 mg Intravenous Q12H  . feeding supplement (PRO-STAT SUGAR FREE 64)  30  mL Per Tube BID  . heparin  5,000 Units Subcutaneous 3 times per day  . insulin aspart  0-20 Units Subcutaneous 6 times per day  . insulin aspart  3 Units Subcutaneous 6 times per day  . metoprolol  2.5 mg Intravenous Q4H  . potassium chloride  30 mEq Per Tube Q4H  . sodium chloride  10-40 mL Intracatheter Q12H  . sodium chloride  3 mL Intravenous Q12H  . ticagrelor  90 mg Oral BID   . sodium chloride Stopped (11/16/15 2019)  . dexmedetomidine 1.2 mcg/kg/hr (11/17/15 [id number])  . feeding supplement (VITAL AF 1.2 CAL) 1,000 mL (11/16/15 1900)  . fentaNYL infusion INTRAVENOUS 300 mcg/hr (11/16/15 2130)  . norepinephrine (LEVOPHED) Adult infusion 5 mcg/min (11/17/15 0700)  . propofol (DIPRIVAN) infusion Stopped (11/16/15 1500)    ASSESSMENT AND PLAN: Principal Problem:   Cardiac arrest with ventricular fibrillation ([place]) - mgt per CCM - possible SBT today  Active Problems:   ST elevation (STEMI) myocardial infarction involving left anterior  descending coronary artery ([name]) - s/p DES LAD - EF better by echo than at cath - on ASA, Brilinta, statin  - also getting metoprolol 2.5 mg q 4 h for HR control - add ACE once BP more stable    Rheumatoid arthritis ([place]) - off methotrexate for now    Acute respiratory failure with hypoxia ([place]) - per CCM    Shock circulatory ([place]) - Norepi restarted last pm.   Blood pressure is stable today. Plan to continue low-dose pressor during the day today.    Coma ([place]) - may have anoxic brain injury    CAD S/P PCI OF pLAD with 3.5 mm x38 mm Promus DES (3.75 mm) - see above    Cardiomyopathy, ischemic - EF 20-25% by LV Gram - EF 50-55% by echo - on BB, add ACE when able    Acute delirium - still requiring sedation. The patient is on maximum dose sedation. He still arouses. He becomes agitated with increased heart rate. I feel that we should not change medications for his heart rate at this time. His blood pressure continues to be borderline with need for low-dose pressors.  Signed, Rosaria Ferries , PA-C 7:05 AM 11/17/2015  Patient seen and examined. I agree with the assessment and plan as detailed above. See also my additional thoughts below.   I agree with the full assessment above. It includes my addition. The patient acquired high dose sedation. He develops sinus tachycardia when agitated. However there are other times his rate is controlled. No further change in his therapy at this time.  Dola Argyle, MD, [facility] 11/17/2015 9:34 AM

## 2015-11-17 NOTE — Progress Notes (Signed)
RT note- increased fio2 and peep +8, moderate amount of pink frothy secretions, sp02 78%-82%, HME changed, MD at the bedside. Continue to monitor.

## 2015-11-17 NOTE — Progress Notes (Signed)
[facility] ADULT ICU REPLACEMENT PROTOCOL FOR AM LAB REPLACEMENT ONLY  The patient does apply for the [facility] Adult ICU Electrolyte Replacment Protocol based on the criteria listed below:   1. Is GFR >/= 40 ml/min? Yes.    Patient's GFR today is >60 2. Is urine output >/= 0.5 ml/kg/hr for the last 6 hours? Yes.   Patient's UOP is 0.62 ml/kg/hr 3. Is BUN < 60 mg/dL? Yes.    Patient's BUN today is 29 4. Abnormal electrolyte  K 3.0 5. Ordered repletion with: per protocol 6. If a panic level lab has been reported, has the CCM MD in charge been notified? Yes.  .   Physician:  Elroy Channel 11/17/2015 6:43 AM

## 2015-11-18 ENCOUNTER — Inpatient Hospital Stay (HOSPITAL_COMMUNITY): Payer: BLUE CROSS/BLUE SHIELD

## 2015-11-18 LAB — ALBUMIN: ALBUMIN: 2.1 g/dL — AB (ref 3.5–5.0)

## 2015-11-18 LAB — CBC WITH DIFFERENTIAL/PLATELET
Basophils Absolute: 0 10*3/uL (ref 0.0–0.1)
Basophils Relative: 0 %
EOS ABS: 0.1 10*3/uL (ref 0.0–0.7)
Eosinophils Relative: 1 %
HEMATOCRIT: 31.2 % — AB (ref 39.0–52.0)
Hemoglobin: 10.7 g/dL — ABNORMAL LOW (ref 13.0–17.0)
LYMPHS ABS: 1.1 10*3/uL (ref 0.7–4.0)
LYMPHS PCT: 14 %
MCH: 32.2 pg (ref 26.0–34.0)
MCHC: 34.3 g/dL (ref 30.0–36.0)
MCV: 94 fL (ref 78.0–100.0)
MONOS PCT: 12 %
Monocytes Absolute: 1 10*3/uL (ref 0.1–1.0)
NEUTROS PCT: 73 %
Neutro Abs: 5.7 10*3/uL (ref 1.7–7.7)
Platelets: 156 10*3/uL (ref 150–400)
RBC: 3.32 MIL/uL — ABNORMAL LOW (ref 4.22–5.81)
RDW: 15.1 % (ref 11.5–15.5)
WBC: 7.9 10*3/uL (ref 4.0–10.5)

## 2015-11-18 LAB — GLUCOSE, CAPILLARY
GLUCOSE-CAPILLARY: 136 mg/dL — AB (ref 65–99)
GLUCOSE-CAPILLARY: 110 mg/dL — AB (ref 65–99)
GLUCOSE-CAPILLARY: 148 mg/dL — AB (ref 65–99)
GLUCOSE-CAPILLARY: 142 mg/dL — AB (ref 65–99)
Glucose-Capillary: 157 mg/dL — ABNORMAL HIGH (ref 65–99)
Glucose-Capillary: 168 mg/dL — ABNORMAL HIGH (ref 65–99)

## 2015-11-18 LAB — BLOOD GAS, ARTERIAL
ACID-BASE EXCESS: 2.7 mmol/L — AB (ref 0.0–2.0)
BICARBONATE: 26.2 meq/L — AB (ref 20.0–24.0)
Drawn by: 406621
FIO2: 0.7
LHR: 20 {breaths}/min
O2 Saturation: 95.9 %
PEEP/CPAP: 8 cmH2O
PH ART: 7.47 — AB (ref 7.350–7.450)
Patient temperature: 98.6
TCO2: 27.3 mmol/L (ref 0–100)
VT: 550 mL
pCO2 arterial: 36.4 mmHg (ref 35.0–45.0)
pO2, Arterial: 77.3 mmHg — ABNORMAL LOW (ref 80.0–100.0)

## 2015-11-18 LAB — BASIC METABOLIC PANEL
Anion gap: 4 — ABNORMAL LOW (ref 5–15)
BUN: 31 mg/dL — AB (ref 6–20)
CHLORIDE: 111 mmol/L (ref 101–111)
CO2: 29 mmol/L (ref 22–32)
CREATININE: 0.95 mg/dL (ref 0.61–1.24)
Calcium: 8.1 mg/dL — ABNORMAL LOW (ref 8.9–10.3)
GFR calc Af Amer: >60 mL/min (ref >60)
GFR calc non Af Amer: >60 mL/min (ref >60)
Glucose, Bld: 151 mg/dL — ABNORMAL HIGH (ref 65–99)
Potassium: 3.8 mmol/L (ref 3.5–5.1)
Sodium: 144 mmol/L (ref 135–145)

## 2015-11-18 LAB — PHOSPHORUS: Phosphorus: 2.7 mg/dL (ref 2.5–4.6)

## 2015-11-18 LAB — TRIGLYCERIDES: Triglycerides: 144 mg/dL (ref <150)

## 2015-11-18 LAB — MAGNESIUM: Magnesium: 2.2 mg/dL (ref 1.7–2.4)

## 2015-11-18 MED ORDER — TRAZODONE HCL 50 MG PO TABS
100.0000 mg | ORAL_TABLET | Freq: Every day | ORAL | Status: DC
  Administered 2015-11-18 – 2015-11-28 (×10): 100 mg via ORAL
  Filled 2015-11-18 (×12): qty 2

## 2015-11-18 MED ORDER — QUETIAPINE FUMARATE 50 MG PO TABS
50.0000 mg | ORAL_TABLET | Freq: Two times a day (BID) | ORAL | Status: DC
  Administered 2015-11-18 – 2015-11-21 (×8): 50 mg via ORAL
  Filled 2015-11-18 (×9): qty 1

## 2015-11-18 MED ORDER — FUROSEMIDE 10 MG/ML IJ SOLN
40.0000 mg | Freq: Two times a day (BID) | INTRAMUSCULAR | Status: DC
  Administered 2015-11-18 – 2015-11-20 (×6): 40 mg via INTRAVENOUS
  Filled 2015-11-18 (×6): qty 4

## 2015-11-18 MED ORDER — FUROSEMIDE 10 MG/ML IJ SOLN: 40.0000 mg | Freq: Two times a day (BID) | INTRAMUSCULAR | Status: DC

## 2015-11-18 NOTE — Progress Notes (Signed)
PULMONARY / CRITICAL CARE MEDICINE   Name: [name] MRN: [id number] DOB: 1946/06/13    ADMISSION DATE:  11/11/2015 CONSULTATION DATE:  11/11/2015  REFERRING MD:  Dr. Burt Knack  CHIEF COMPLAINT:  Cardiac arrest  HISTORY OF PRESENT ILLNESS:  69 year old male with PMH of RA and HTN. He presented to [facility] ED 11/29 after a witnessed cardiac arrest at home. He has had intermittent chest and arm pain for about 2 weeks status post mechanical fall. 11/29 chest pain acutely worsened and he unfortunately suffered a cardiac arrest, which was witnessed by his wife and EMS was called. Family initiated CPR. EMS arrived and AED was placed advising shock. 2 shocks were delivered and ROSC was achieved. 2 rounds epinephrine also administerred. EKG in the field was concerning for STEMI and code STEMI was called. In ED patient was unresponsive, he was intubated and taken emergently to cardiac cath lab where he remains at this time. Occlusive LAD lesion identified and intervened upon. PCCM consulted for ICU care. Total downtime estimated at less than 15 mins.   SUBJECTIVE:  More awake today and responsive. Continues to have Severe agitation on Precedex and fentanyl. Restarted on the propofol.  VITAL SIGNS: BP 95/53 mmHg  Pulse 65  Temp(Src) 97.8 F (36.6 C) (Axillary)  Resp 20  Ht 5\' 7"  (1.702 m)  Wt 198 lb 6.6 oz (90 kg)  BMI 31.07 kg/m2  SpO2 97%  HEMODYNAMICS: CVP:  [9 mmHg-12 mmHg] 12 mmHg  VENTILATOR SETTINGS: Vent Mode:  [-] PRVC FiO2 (%):  [50 %-100 %] 50 % Set Rate:  [20 bmp] 20 bmp Vt Set:  [550 mL] 550 mL PEEP:  [8 cmH20] 8 cmH20 Plateau Pressure:  [17 cmH20-27 cmH20] 17 cmH20  INTAKE / OUTPUT: I/O last 3 completed shifts: In: 5096.6 [I.V.:2662.6; QW:028793; IV Piggyback:350] Out: 2455 [Urine:2055; Emesis/NG output:400]  PHYSICAL EXAMINATION: General:Sedated, agitated, uncomfortable  Neuro: Open eyes to command, nods to questions HEENT:ET tube in place, moist mucous  membranes  Cardiovascular: Sinus tachycardia, no murmurs rubs gallops  Lungs:Clear anteriorly  Abdomen: Soft, positive bowel sounds  Skin: Intact   LABS:  CBC  Recent Labs Lab 11/16/15 0515 11/17/15 0520 11/18/15 0620  WBC 10.5 9.9 7.9  HGB 10.8* 10.5* 10.7*  HCT 31.7* 29.9* 31.2*  PLT 156 167 156   Coag's  Recent Labs Lab 11/12/15 0015 11/12/15 0932  APTT 75* 24  INR 1.19 1.11   BMET  Recent Labs Lab 11/16/15 0515 11/17/15 0520 11/18/15 0620  NA 141 141 144  K 3.0* 3.0* 3.8  CL 100* 104 111  CO2 33* 30 29  BUN 35* 29* 31*  CREATININE 1.06 0.97 0.95  GLUCOSE 117* 153* 151*   Electrolytes  Recent Labs Lab 11/16/15 0515 11/17/15 0520 11/18/15 0620  CALCIUM 8.4* 8.2* 8.1*  MG 2.0 2.1 2.2  PHOS 3.3 2.6 2.7   Sepsis Markers  Recent Labs Lab 11/12/15 1300 11/13/15 11/13/15 0500 11/14/15 0400  LATICACIDVEN  --  1.5  --   --   PROCALCITON 0.22  --  0.20 0.13   ABG  Recent Labs Lab 11/12/15 0330 11/13/15 0305 11/13/15 1849  PHART 7.391 7.416 7.309*  PCO2ART 31.2* 25.4* 38.2  PO2ART 164* 70.7* 95.0   Liver Enzymes  Recent Labs Lab 11/13/15 0500 11/14/15 0400 11/15/15 0420 11/16/15 0515 11/17/15 0520 11/18/15 0620  AST 118* 39 133*  --   --   --   ALT 158* 80* 78*  --   --   --  ALKPHOS 62 53 56  --   --   --   BILITOT 0.7 0.4 0.8  --   --   --   ALBUMIN 3.2* 2.5* 2.6* 2.3* 2.3* 2.1*   Cardiac Enzymes  Recent Labs Lab 11/12/15 0804 11/12/15 1138 11/12/15 1800  TROPONINI 26.88* 12.23* 8.53*   Glucose  Recent Labs Lab 11/17/15 0740 11/17/15 1212 11/17/15 1605 11/17/15 1936 11/17/15 2305 11/18/15 0736  GLUCAP 151* 141* 153* 140* 167* 110*    Imaging Dg Chest Port 1 View  11/18/2015  CLINICAL DATA:  69 year old male with acute respiratory failure. Status post cardiac arrest. Initial encounter. EXAM: PORTABLE CHEST 1 VIEW COMPARISON:  11/17/2015 and earlier. FINDINGS: Portable AP semi upright view at 0514 hours.  Stable endotracheal tube. Stable visualized enteric tube coursing to the abdomen. Stable left IJ central line. Increased veiling opacity at the right lung base. Continued dense retrocardiac opacity. Continued left and new right perihilar interstitial opacity. No pneumothorax. Small left side effusion suspected. IMPRESSION: 1.  Stable lines and tubes. 2. Interval increased bilateral perihilar opacity and right pleural effusion. Favor pulmonary interstitial edema over infection. 3. Continued dense lower lobe collapse/consolidation. Suspect stable small left effusion. Electronically Signed   By: Genevie Ann M.D.   On: 11/18/2015 07:12   Dg Abd Portable 1v  11/18/2015  CLINICAL DATA:  Orogastric tube placement EXAM: PORTABLE ABDOMEN - 1 VIEW COMPARISON:  CT abdomen and pelvis March 29, 2015 FINDINGS: Orogastric tube tip and side port are in the stomach. There is moderate stool throughout the colon. There is no bowel dilatation or air-fluid level suggesting obstruction. No free air is seen on this supine examination. IMPRESSION: Orogastric tube tip and side port in stomach. Bowel gas pattern unremarkable. Moderate stool throughout colon. Electronically Signed   By: Lowella Grip III M.D.   On: 11/18/2015 08:15     STUDIES:  11-23-23  LHC - stent lad November 23, 2023  CT head - no acute abnormality 11/30  eeg - no seizure activity. suppression 12/03  Port CXR 12/3 - Bilat lower lung opacity. 12/04  Rewarmed   CULTURES:   ANTIBIOTICS: Levaquin 11/30 >> 12/4  SIGNIFICANT EVENTS: 11-23-2023 cardiac arrest > cath lab, out to ICU, therapeutic hypothermia.  12/02 completed rewarming  LINES/TUBES: ETT 2023/11/23 >>  CVL 11/23/2023 >> Left radial 2023-11-23 >> 12/3   ASSESSMENT / PLAN:  PULMONARY A: Acute Hypoxic Respiratory Failure Severe CAP vs Aspiration Pneumonia   P:   MV support, 8 cc/kg Wean PEEP / FiO2 for sats > 92% Daily SBT / WUA as tolerated. ICU delirium is a significant barrier to progress  Intermittent CXR   VAP preventions measures   CARDIOVASCULAR A:  Cardiac arrest - VT/VF STEMI s/p PCI 11/23/23 HTN  P:  Care per Cardiology Wyn Quaker, Lopressor & Lipitor ICU monitoring of hemodynamics  Follow triglycerides while on propofol  Lasix for diuresis as chest x-ray shows edema  RENAL A:   Hypokalemia  Hypophosphatemia - Resolved. Hypomagnesemia - Resolved.  P:   Monitor UOP / BMP Renal function is stable    GASTROINTESTINAL A:   Transaminitis - Shock liver. Resolving. At Risk Protein Calorie Malnutrition  P:   NPO Pepcid IV q12hr Tube feeds at goal  HEMATOLOGIC A:   Leukocytosis - Improving. Suspect stress response. Anemia  P:  Follow CBC Antiplatelet / anticoagulation per Cardiology Heparin [place] q8hr for DVT prophylaxis   INFECTIOUS A:   Possible CAP Higher risk for infection from cooling  P:  Finished a course of levofloxacin  Monitor fever curve / WBC  ENDOCRINE A:   Hyperglycemia - No H/O DM.  P:   Accu-Checks q4hr SSI coverage  AUTOIMMUNE:  A:  Rheumatoid Arthritis - on DMARD & Methotrexate at baseline   P:  Hold home medications  NEUROLOGIC A:   Concern for Anoxic Brain Injury - EEG negative. Following commands 12/3.  P:   RASS goal:  0 to -1  On max doses of Precedex, fentanyl. Started propofol yesterday  On Haldol. QTC okay Start Seroquel and home trazodone Daily WUA / SBTif agitation continues to be a barrier to weaning then he may need a trach.  FAMILY - Updates: Wife and daughter updated 12/6 - Inter-disciplinary family meet or Palliative Care meeting due by:  12/7  Marshell Garfinkel MD [place] [facility] Pager [phone] If no answer or after 3pm call: [phone] 11/18/2015, 10:42 AM    TODAY'S SUMMARY:  69 year old male post arrest. S/P rewarming. Difficulty with agitation, sedation are barriers to weaning. He may need a trach   Critical care time-35 minutes  Marshell Garfinkel MD [place] [facility] Pager [phone] If no answer or after 3pm call: [phone] 11/18/2015, 10:37 AM

## 2015-11-18 NOTE — Progress Notes (Signed)
Patient Name: [name] Date of Encounter: 11/18/2015  Principal Problem:   Cardiac arrest with ventricular fibrillation ([name]) Active Problems:   ST elevation (STEMI) myocardial infarction involving left anterior descending coronary artery ([name])   Rheumatoid arthritis ([name])   Acute respiratory failure with hypoxia ([name])   Shock circulatory ([name])   Coma ([name])   CAD S/P PCI OF pLAD with 3.5 mm x38 mm Promus DES (3.75 mm)   Cardiomyopathy, ischemic - EF 20-25% by LV Gram   Acute delirium   Primary Cardiologist: Dr Burt Knack  Patient Profile: 69 yo male w/ no previous cardiac hx, admitted 11/29 w/ OOH cardiac arrest in setting of anterior STEMI, s/p DES LAD, EF 25% at cath, 50% by echo.   SUBJECTIVE: Failed wakeup 12/05, became agitated and tachycardic. Trying again today  OBJECTIVE Filed Vitals:   11/18/15 0617 11/18/15 0700 11/18/15 0716 11/18/15 0739  BP: 98/57 90/52 90/52    Pulse: 68 65 65   Temp:    97.8 F (36.6 C)  TempSrc:    Axillary  Resp: 20 20 20    Height: 5\' 7"  (1.702 m)     Weight: 198 lb 6.6 oz (90 kg)     SpO2: 90% 97% 97%     Intake/Output Summary (Last 24 hours) at 11/18/15 [id number] Last data filed at 11/18/15 0700  Gross per 24 hour  Intake 3153.28 ml  Output   1510 ml  Net 1643.28 ml   Filed Weights   11/16/15 0200 11/17/15 0200 11/18/15 0617  Weight: 193 lb 5.5 oz (87.7 kg) 197 lb 5 oz (89.5 kg) 198 lb 6.6 oz (90 kg)    PHYSICAL EXAM General: Well developed, well nourished, male  Head: Normocephalic, atraumatic.  Neck: Supple without bruits, JVD not well seen 2nd equipment. Lungs:  Resp regular and unlabored, bilateral rales and rhonchi Heart: RRR, S1, S2, no S3, S4, or murmur; no rub. Abdomen: Soft, non-tender, distended, BS + x 4.  Extremities: No clubbing, cyanosis,  Neuro: Patient continues to suffer from delirium. He does respond some to his family. Psych:  LABS: CBC: Recent Labs  11/17/15 0520 11/18/15 0620  WBC 9.9 7.9   NEUTROABS 7.6 5.7  HGB 10.5* 10.7*  HCT 29.9* 31.2*  MCV 90.9 94.0  PLT 167 [id number]   Basic Metabolic Panel: Recent Labs  11/17/15 0520 11/18/15 0620  NA 141 144  K 3.0* 3.8  CL 104 111  CO2 30 29  GLUCOSE 153* 151*  BUN 29* 31*  CREATININE 0.97 0.95  CALCIUM 8.2* 8.1*  MG 2.1 2.2  PHOS 2.6 2.7   Liver Function Tests: Recent Labs  11/17/15 0520 11/18/15 0620  ALBUMIN 2.3* 2.1*   Fasting Lipid Panel: Recent Labs  11/18/15 0620  TRIG 144    TELE:   SR, ST, HR 140s during waking trial   Radiology/Studies: Dg Chest Port 1 View 11/18/2015  CLINICAL DATA:  69 year old male with acute respiratory failure. Status post cardiac arrest. Initial encounter. EXAM: PORTABLE CHEST 1 VIEW COMPARISON:  11/17/2015 and earlier. FINDINGS: Portable AP semi upright view at 0514 hours. Stable endotracheal tube. Stable visualized enteric tube coursing to the abdomen. Stable left IJ central line. Increased veiling opacity at the right lung base. Continued dense retrocardiac opacity. Continued left and new right perihilar interstitial opacity. No pneumothorax. Small left side effusion suspected. IMPRESSION: 1.  Stable lines and tubes. 2. Interval increased bilateral perihilar opacity and right pleural effusion. Favor pulmonary interstitial edema over infection. 3. Continued  dense lower lobe collapse/consolidation. Suspect stable small left effusion. Electronically Signed   By: Genevie Ann M.D.   On: 11/18/2015 07:12   Dg Chest Port 1 View 11/17/2015  CLINICAL DATA:  Acute respiratory failure, CHF, and coronary artery disease EXAM: PORTABLE CHEST 1 VIEW COMPARISON:  Portable chest x-ray of November 15, 2015 FINDINGS: The lungs are adequately inflated. There has been further development of alveolar opacity in the left mid and lower lung. The retrocardiac region is opacified and the left hemidiaphragm is largely obscured. There is persistent alveolar opacity in the right infrahilar region. The heart is mildly  enlarged. The pulmonary vascularity is mildly engorged. The endotracheal tube tip projects 4.9 cm above the carina. The esophagogastric tube tip projects below the inferior margin of the image. The left internal jugular venous catheter tip projects over the midportion of the SVC. IMPRESSION: Slight interval worsening of alveolar opacities on the left compatible with pneumonia or less likely pulmonary edema. Stable subsegmental atelectasis or early infiltrate in the right infrahilar region. Low-grade CHF. Electronically Signed   By: David  Martinique M.D.   On: 11/17/2015 07:11     Current Medications:  . antiseptic oral rinse  7 mL Mouth Rinse 10 times per day  . aspirin  81 mg Oral Daily  . atorvastatin  80 mg Oral q1800  . chlorhexidine gluconate  15 mL Mouth Rinse BID  . famotidine  20 mg Oral BID  . feeding supplement (PRO-STAT SUGAR FREE 64)  30 mL Per Tube BID  . haloperidol lactate  5 mg Intravenous Q4H  . heparin  5,000 Units Subcutaneous 3 times per day  . insulin aspart  0-20 Units Subcutaneous 6 times per day  . insulin aspart  3 Units Subcutaneous 6 times per day  . metoprolol  2.5 mg Intravenous Q4H  . sodium chloride  10-40 mL Intracatheter Q12H  . sodium chloride  3 mL Intravenous Q12H  . ticagrelor  90 mg Oral BID   . sodium chloride 30 mL/hr at 11/17/15 1141  . dexmedetomidine 1 mcg/kg/hr (11/17/15 1931)  . feeding supplement (VITAL AF 1.2 CAL) 1,000 mL (11/16/15 1900)  . fentaNYL infusion INTRAVENOUS 200 mcg/hr (11/18/15 0748)  . norepinephrine (LEVOPHED) Adult infusion 2 mcg/min (11/17/15 1057)  . propofol (DIPRIVAN) infusion 10 mcg/kg/min (11/18/15 0802)    ASSESSMENT AND PLAN: Principal Problem:  Cardiac arrest with ventricular fibrillation ([name]) - mgt per CCM - possible SBT today  Active Problems:  ST elevation (STEMI) myocardial infarction involving left anterior descending coronary artery ([name]) - s/p DES LAD - EF better by echo than at cath - on ASA,  Brilinta, statin  - also getting metoprolol 2.5 mg q 4 h for HR control - add ACE once BP more stable   Rheumatoid arthritis ([name]) - off methotrexate for now   Acute respiratory failure with hypoxia ([name]) - per CCM - however, CXR worse today. - Weight is up 11 lbs since admit - discuss Lasix w/ MD.   His weight is definitely up and he is wet. Lasix will be started.   Shock circulatory ([place]) - Still on Norepi (restarted 12/04) Blood pressure is stable today. Plan to continue low-dose pressor during the day today.   Coma ([place]) - may have anoxic brain injury   CAD S/P PCI OF pLAD with 3.5 mm x38 mm Promus DES (3.75 mm) - see above   Cardiomyopathy, ischemic - EF 20-25% by LV Gram - EF 50-55% by echo - on BB, add  ACE when able   Acute delirium - still requiring sedation. The patient is on maximum dose sedation. He still arouses. He becomes agitated with increased heart rate. I feel that we should not change medications for his heart rate at this time. His blood pressure continues to be borderline with need for low-dose pressors.   Jonetta Speak , PA-C 8:07 AM 11/18/2015 Patient seen and examined. I agree with the assessment and plan as detailed above. See also my additional thoughts below.   I've spoken with the family in the room. Spoken with the critical care medicine team. Lasix will be started. His overall cardiac status is stable today.  Dola Argyle, MD, [facility] 11/18/2015 10:47 AM

## 2015-11-18 NOTE — Progress Notes (Signed)
RN had changed peep to 5, increased back to 8 per verbal order, Dr. Vaughan Browner. Based on ABG

## 2015-11-19 ENCOUNTER — Inpatient Hospital Stay (HOSPITAL_COMMUNITY): Payer: BLUE CROSS/BLUE SHIELD

## 2015-11-19 LAB — RENAL FUNCTION PANEL
ALBUMIN: 2 g/dL — AB (ref 3.5–5.0)
ANION GAP: 6 (ref 5–15)
BUN: 32 mg/dL — ABNORMAL HIGH (ref 6–20)
CO2: 30 mmol/L (ref 22–32)
CREATININE: 0.88 mg/dL (ref 0.61–1.24)
Calcium: 8 mg/dL — ABNORMAL LOW (ref 8.9–10.3)
Chloride: 106 mmol/L (ref 101–111)
GFR calc Af Amer: >60 mL/min (ref >60)
Glucose, Bld: 145 mg/dL — ABNORMAL HIGH (ref 65–99)
PHOSPHORUS: 3 mg/dL (ref 2.5–4.6)
POTASSIUM: 3.2 mmol/L — AB (ref 3.5–5.1)
Sodium: 142 mmol/L (ref 135–145)

## 2015-11-19 LAB — GLUCOSE, CAPILLARY
GLUCOSE-CAPILLARY: 140 mg/dL — AB (ref 65–99)
Glucose-Capillary: 131 mg/dL — ABNORMAL HIGH (ref 65–99)
Glucose-Capillary: 133 mg/dL — ABNORMAL HIGH (ref 65–99)
Glucose-Capillary: 123 mg/dL — ABNORMAL HIGH (ref 65–99)

## 2015-11-19 LAB — CBC WITH DIFFERENTIAL/PLATELET
BASOS ABS: 0 10*3/uL (ref 0.0–0.1)
Basophils Relative: 0 %
EOS PCT: 3 %
Eosinophils Absolute: 0.3 10*3/uL (ref 0.0–0.7)
HEMATOCRIT: 32.8 % — AB (ref 39.0–52.0)
Hemoglobin: 10.7 g/dL — ABNORMAL LOW (ref 13.0–17.0)
LYMPHS ABS: 1.2 10*3/uL (ref 0.7–4.0)
LYMPHS PCT: 14 %
MCH: 30.7 pg (ref 26.0–34.0)
MCHC: 32.6 g/dL (ref 30.0–36.0)
MCV: 94.3 fL (ref 78.0–100.0)
Monocytes Absolute: 1 10*3/uL (ref 0.1–1.0)
Monocytes Relative: 11 %
NEUTROS ABS: 6.6 10*3/uL (ref 1.7–7.7)
Neutrophils Relative %: 72 %
PLATELETS: 178 10*3/uL (ref 150–400)
RBC: 3.48 MIL/uL — AB (ref 4.22–5.81)
RDW: 15 % (ref 11.5–15.5)
WBC: 9 10*3/uL (ref 4.0–10.5)

## 2015-11-19 LAB — TRIGLYCERIDES: Triglycerides: 198 mg/dL — ABNORMAL HIGH (ref <150)

## 2015-11-19 LAB — MAGNESIUM: Magnesium: 2.2 mg/dL (ref 1.7–2.4)

## 2015-11-19 MED ORDER — SENNOSIDES 8.8 MG/5ML PO SYRP
5.0000 mL | ORAL_SOLUTION | Freq: Two times a day (BID) | ORAL | Status: DC | PRN
  Administered 2015-11-19: 5 mL
  Filled 2015-11-19 (×2): qty 5

## 2015-11-19 MED ORDER — HALOPERIDOL LACTATE 5 MG/ML IJ SOLN: 5.0000 mg | INTRAMUSCULAR | Status: DC | PRN

## 2015-11-19 MED ORDER — POTASSIUM CHLORIDE 20 MEQ/15ML (10%) PO SOLN
40.0000 meq | Freq: Two times a day (BID) | ORAL | Status: AC
  Administered 2015-11-19 (×2): 40 meq
  Filled 2015-11-19 (×2): qty 30

## 2015-11-19 MED ORDER — ALPRAZOLAM 0.5 MG PO TABS
0.5000 mg | ORAL_TABLET | Freq: Two times a day (BID) | ORAL | Status: DC | PRN
  Administered 2015-11-19 – 2015-11-20 (×2): 0.5 mg via ORAL
  Filled 2015-11-19 (×2): qty 1

## 2015-11-19 NOTE — Progress Notes (Signed)
Nutrition Follow-up  INTERVENTION:   Continue Vital AF 1.2 @ 54 ml/hr Continue 30 ml Prostat BID Provides 1750 kcal, 125 grams protein, and 1047 ml H2O TF regimen and propofol at current rate providing 1953 total kcal/day (99 % of kcal needs)  NUTRITION DIAGNOSIS:   Inadequate oral intake related to inability to eat as evidenced by NPO status. Ongoing.   GOAL:   Patient will meet greater than or equal to 90% of their needs Met.   MONITOR:   Vent status, Labs, Weight trends, TF tolerance  ASSESSMENT:   69 y.o. male w/ PMHx significant for rheumatoid arthritis and HTN who presented to [facility] on 11/11/2015 after an out-of-hospital cardiac arrest. The patient reportedly had chest and arm pain intermittently for 2 weeks after sustaining a mechanical fall. Today he had worsening of his chest pain and he ultimately arrested at home, witnessed by his wife.  Patient is currently intubated on ventilator support MV: 12.2 L/min Temp (24hrs), Avg:98.7 F (37.1 C), Min:97.4 F (36.3 C), Max:99.9 F (37.7 C)  Propofol: 7.7 ml/hr provides: 203 kcal per day from lipid Agitation barrier to extubation, started on Precedex and seroquel Labs reviewed: potassium low (3.2) on repletion, phosphorus and magnesium are WNL  Diet Order:    NPO  Skin:  Reviewed, no issues  Last BM:  11/29  Height:   Ht Readings from Last 1 Encounters:  11/18/15 _0  (1.702 m)   Weight:   Wt Readings from Last 1 Encounters:  11/19/15 196 lb 13.9 oz (89.3 kg)  85 kg on admission  Ideal Body Weight:  67.27 kg  BMI:  Body mass index is 30.83 kg/(m^2).  Estimated Nutritional Needs:   Kcal:  1982  Protein:  100 - 126 grams  Fluid:  1L  EDUCATION NEEDS:   No education needs identified at this time  [place], [place], [place] Pager [phone] After Hours Pager

## 2015-11-19 NOTE — Progress Notes (Signed)
PULMONARY / CRITICAL CARE MEDICINE   Name: [name] MRN: [id number] DOB: December 21, 1945    ADMISSION DATE:  11/11/2015 CONSULTATION DATE:  11/11/2015  REFERRING MD:  Dr. Burt Knack  CHIEF COMPLAINT:  Cardiac arrest  HISTORY OF PRESENT ILLNESS:  69 year old male with PMH of RA and HTN. He presented to [facility] ED 11/29 after a witnessed cardiac arrest at home. He has had intermittent chest and arm pain for about 2 weeks status post mechanical fall. 11/29 chest pain acutely worsened and he unfortunately suffered a cardiac arrest, which was witnessed by his wife and EMS was called. Family initiated CPR. EMS arrived and AED was placed advising shock. 2 shocks were delivered and ROSC was achieved. 2 rounds epinephrine also administerred. EKG in the field was concerning for STEMI and code STEMI was called. In ED patient was unresponsive, he was intubated and taken emergently to cardiac cath lab where he remains at this time. Occlusive LAD lesion identified and intervened upon. PCCM consulted for ICU care. Total downtime estimated at less than 15 mins.   SUBJECTIVE:  More awake today and responsive. Agitation is better controlled. He is off propofol.  VITAL SIGNS: BP 130/65 mmHg  Pulse 103  Temp(Src) 99 F (37.2 C) (Oral)  Resp 25  Ht 5\' 7"  (1.702 m)  Wt 196 lb 13.9 oz (89.3 kg)  BMI 30.83 kg/m2  SpO2 93%  HEMODYNAMICS: CVP:  [9 mmHg-10 mmHg] 10 mmHg  VENTILATOR SETTINGS: Vent Mode:  [-] PRVC FiO2 (%):  [50 %-80 %] 50 % Set Rate:  [20 bmp] 20 bmp Vt Set:  [550 mL] 550 mL PEEP:  [5 cmH20-8 cmH20] 5 cmH20 Plateau Pressure:  [13 cmH20-22 cmH20] 22 cmH20  INTAKE / OUTPUT: I/O last 3 completed shifts: In: 4409.8 [I.V.:2519.8; NG/GT:1890] Out: [id number] [Urine:5575]  PHYSICAL EXAMINATION: General:Sedated, calm, responsive Neuro: Open eyes to command, nods to questions HEENT:ET tube in place, moist mucous membranes  Cardiovascular: S1-S2, regular rate and rhythm, no murmurs rubs  gallops  Lungs:Clear anteriorly  Abdomen: Soft, positive bowel sounds  Skin: Intact   LABS:  CBC  Recent Labs Lab 11/17/15 0520 11/18/15 0620 11/19/15 0455  WBC 9.9 7.9 9.0  HGB 10.5* 10.7* 10.7*  HCT 29.9* 31.2* 32.8*  PLT 167 156 178   Coag's  Recent Labs Lab 11/12/15 0932  APTT 24  INR 1.11   BMET  Recent Labs Lab 11/17/15 0520 11/18/15 0620 11/19/15 0455  NA 141 144 142  K 3.0* 3.8 3.2*  CL 104 111 106  CO2 30 29 30   BUN 29* 31* 32*  CREATININE 0.97 0.95 0.88  GLUCOSE 153* 151* 145*   Electrolytes  Recent Labs Lab 11/17/15 0520 11/18/15 0620 11/19/15 0455  CALCIUM 8.2* 8.1* 8.0*  MG 2.1 2.2 2.2  PHOS 2.6 2.7 3.0   Sepsis Markers  Recent Labs Lab 11/12/15 1300 11/13/15 11/13/15 0500 11/14/15 0400  LATICACIDVEN  --  1.5  --   --   PROCALCITON 0.22  --  0.20 0.13   ABG  Recent Labs Lab 11/13/15 0305 11/13/15 1849 11/18/15 1150  PHART 7.416 7.309* 7.470*  PCO2ART 25.4* 38.2 36.4  PO2ART 70.7* 95.0 77.3*   Liver Enzymes  Recent Labs Lab 11/13/15 0500 11/14/15 0400 11/15/15 0420  11/17/15 0520 11/18/15 0620 11/19/15 0455  AST 118* 39 133*  --   --   --   --   ALT 158* 80* 78*  --   --   --   --  ALKPHOS 62 53 56  --   --   --   --   BILITOT 0.7 0.4 0.8  --   --   --   --   ALBUMIN 3.2* 2.5* 2.6*  < > 2.3* 2.1* 2.0*  < > = values in this interval not displayed. Cardiac Enzymes  Recent Labs Lab 11/12/15 1138 11/12/15 1800  TROPONINI 12.23* 8.53*   Glucose  Recent Labs Lab 11/18/15 0736 11/18/15 1131 11/18/15 1706 11/18/15 1949 11/18/15 2348 11/19/15 0731  GLUCAP 110* 168* 148* 142* 157* 140*    Imaging No results found.   STUDIES:  11-21-2023  LHC - stent lad 11-21-23  CT head - no acute abnormality 11/30  eeg - no seizure activity. suppression 12/03  Port CXR 12/3 - Bilat lower lung opacity. 12/04  Rewarmed   CULTURES:   ANTIBIOTICS: Levaquin 11/30 >> 12/4  SIGNIFICANT EVENTS: 2023-11-21 cardiac arrest >  cath lab, out to ICU, therapeutic hypothermia.  12/02 completed rewarming  LINES/TUBES: ETT 2023-11-21 >>  CVL 11/21/2023 >> Left radial 2023/11/21 >> 12/3   ASSESSMENT / PLAN:  PULMONARY A: Acute Hypoxic Respiratory Failure Severe CAP vs Aspiration Pneumonia   P:   MV support, 8 cc/kg Wean PEEP / FiO2 for sats > 92% Daily SBT / WUA as tolerated. ICU delirium is a significant barrier to progress  Intermittent CXR  VAP preventions measures   CARDIOVASCULAR A:  Cardiac arrest - VT/VF STEMI s/p PCI 11/21/23 HTN  P:  Care per Cardiology Wyn Quaker, Lopressor & Lipitor ICU monitoring of hemodynamics  Follow triglycerides while on propofol  Continue Lasix for diuresis. Check chest x-ray  RENAL A:   Hypokalemia  Hypophosphatemia - Resolved. Hypomagnesemia - Resolved.  P:   Monitor UOP / BMP Renal function is stable  Replete potassium  GASTROINTESTINAL A:   Transaminitis - Shock liver. Resolving. At Risk Protein Calorie Malnutrition  P:   NPO Pepcid IV q12hr Tube feeds at goal  HEMATOLOGIC A:   Leukocytosis - Improving. Suspect stress response. Anemia  P:  Follow CBC Antiplatelet / anticoagulation per Cardiology Heparin  q8hr for DVT prophylaxis   INFECTIOUS A:   Possible CAP Higher risk for infection from cooling  P:   Finished a course of levofloxacin  Monitor fever curve / WBC  ENDOCRINE A:   Hyperglycemia - No H/O DM.  P:   Accu-Checks q4hr SSI coverage  AUTOIMMUNE:  A:  Rheumatoid Arthritis - on DMARD & Methotrexate at baseline   P:  Hold home medications  NEUROLOGIC A:   Concern for Anoxic Brain Injury - EEG negative. Following commands 12/3.  P:   RASS goal:  0 to -1  On Precedex, fentanyl. Started on Seroquel and trazodone. Haldol PRN. QTC okay Daily WUA / SBTif agitation continues to be a barrier to weaning then he may need a trach.  FAMILY - Updates: Wife and daughter updated 12/7 - Inter-disciplinary family meet or  Palliative Care meeting due by:  12/7  Marshell Garfinkel MD [place] [facility] Pager [phone] If no answer or after 3pm call: [phone] 11/19/2015, 9:28 AM    TODAY'S SUMMARY:  69 year old male post arrest. S/P rewarming. Difficulty with agitation, sedation are barriers to weaning.   Critical care time-35 minutes  Marshell Garfinkel MD [place] [facility] Pager [phone] If no answer or after 3pm call: [phone] 11/19/2015, 9:28 AM

## 2015-11-19 NOTE — Care Management Important Message (Deleted)
Important Message  Patient Details  Name: [name] MRN: [id number] Date of Birth: 02/12/1946   Medicare Important Message Given:  Yes    Jas Betten P [place] 11/19/2015, 10:51 AM

## 2015-11-19 NOTE — Progress Notes (Signed)
   11/19/15 1200  Clinical Encounter Type  Visited With Family  Visit Type Follow-up;Psychological support;Spiritual support;Social support  Referral From Nurse  Consult/Referral To Chaplain  Spiritual Encounters  Spiritual Needs Emotional  Stress Factors  Family Stress Factors Exhausted  [place] referred to room to talk with family; pt and family had rough morning; pt currently sedated/resting; [place] encouraged pt wife for self-care; [place] will follow-up.  Gwynn Burly 12:03 PM

## 2015-11-19 NOTE — Progress Notes (Signed)
Patient Name:  [name], DOB: 27-Nov-1946, MRN: [id number] Primary Doctor: [name], MD Primary Cardiologist:   Date: 11/19/2015   SUBJECTIVE: Patient continues to improve slowly. I spoke with his wife and daughter in the room today. He is much less agitated. There was a brief period of bradycardia this morning. It appears that this was a limited vagal response as the nurses were moving him. There was no hypotension. There is no sustained bradycardia. His metoprolol was held earlier and has now been given.   Past Medical History  Diagnosis Date  . Rheumatoid arthritis(714.0) 1998  . Nephrolithiasis 1968  . Insomnia   . Allergic rhinitis   . Impaired glucose tolerance   . Hypertension   . ST elevation (STEMI) myocardial infarction involving left anterior descending coronary artery ([place]) 11/11/2015    100% LAD, Cardiogenic Shock. -- EF ~20-25%  . Cardiac arrest with ventricular fibrillation ([place]) 11/11/2015  . CAD S/P PCI OF pLAD with 3.5 mm x38 mm Promus DES (3.75 mm) 11/12/2015  . Cardiomyopathy, ischemic - EF 20-25% by LV Gram 11/12/2015    There is severe left ventricular systolic dysfunction. The left ventricular ejection fraction is 25-35% by visual estimate. There are wall motion abnormalities in the left ventricle. There are segmental wall motion abnormalities in the left ventricle. The anterolateral and periapical LV walls are akinetic with an estimated LVEF of 25%    Filed Vitals:   11/19/15 0700 11/19/15 0715 11/19/15 0800 11/19/15 0900  BP: 89/53 89/53 136/71 130/65  Pulse: 62 63 96 103  Temp:   99 F (37.2 C)   TempSrc:   Oral   Resp: 20 20 21 25   Height:      Weight:      SpO2: 92% 93% 97% 93%    Intake/Output Summary (Last 24 hours) at 11/19/15 0931 Last data filed at 11/19/15 0825  Gross per 24 hour  Intake 2881.67 ml  Output   5615 ml  Net -2733.33 ml   Filed Weights   11/17/15 0200 11/18/15 0617 11/19/15 0441  Weight: 197  lb 5 oz (89.5 kg) 198 lb 6.6 oz (90 kg) 196 lb 13.9 oz (89.3 kg)     LABS: Basic Metabolic Panel:  Recent Labs  11/18/15 0620 11/19/15 0455  NA 144 142  K 3.8 3.2*  CL 111 106  CO2 29 30  GLUCOSE 151* 145*  BUN 31* 32*  CREATININE 0.95 0.88  CALCIUM 8.1* 8.0*  MG 2.2 2.2  PHOS 2.7 3.0   Liver Function Tests:  Recent Labs  11/18/15 0620 11/19/15 0455  ALBUMIN 2.1* 2.0*   No results for input(s): LIPASE, AMYLASE in the last 72 hours. CBC:  Recent Labs  11/18/15 0620 11/19/15 0455  WBC 7.9 9.0  NEUTROABS 5.7 6.6  HGB 10.7* 10.7*  HCT 31.2* 32.8*  MCV 94.0 94.3  PLT 156 178   Cardiac Enzymes: No results for input(s): CKTOTAL, CKMB, CKMBINDEX, TROPONINI in the last 72 hours. BNP: Invalid input(s): POCBNP D-Dimer: No results for input(s): DDIMER in the last 72 hours. Thyroid Function Tests: No results for input(s): TSH, T4TOTAL, T3FREE, THYROIDAB in the last 72 hours.  Invalid input(s): FREET3  RADIOLOGY: Ct Head Wo Contrast  11/11/2015  CLINICAL DATA:  69 year old male with cardiac arrest. EXAM: CT HEAD WITHOUT CONTRAST TECHNIQUE: Contiguous axial images were obtained from the base of the skull through the vertex without intravenous contrast. COMPARISON:  Head CT dated 03/29/2015 FINDINGS: There is slight prominence  of the ventricles and sulci compatible with age-related volume loss. Mild periventricular and deep white matter hypodensities represent chronic microvascular ischemic changes. There is no intracranial hemorrhage. No mass effect or midline shift identified. The visualized paranasal sinuses and mastoid air cells are well aerated. The calvarium is intact. IMPRESSION: No acute intracranial pathology. Mild age-related atrophy and chronic microvascular ischemic disease. If symptoms persist and there are no contraindications, MRI may provide better evaluation if clinically indicated Electronically Signed   By: Anner Crete M.D.   On: 11/11/2015 23:44    Dg Chest Port 1 View  11/18/2015  CLINICAL DATA:  69 year old male with acute respiratory failure. Status post cardiac arrest. Initial encounter. EXAM: PORTABLE CHEST 1 VIEW COMPARISON:  11/17/2015 and earlier. FINDINGS: Portable AP semi upright view at 0514 hours. Stable endotracheal tube. Stable visualized enteric tube coursing to the abdomen. Stable left IJ central line. Increased veiling opacity at the right lung base. Continued dense retrocardiac opacity. Continued left and new right perihilar interstitial opacity. No pneumothorax. Small left side effusion suspected. IMPRESSION: 1.  Stable lines and tubes. 2. Interval increased bilateral perihilar opacity and right pleural effusion. Favor pulmonary interstitial edema over infection. 3. Continued dense lower lobe collapse/consolidation. Suspect stable small left effusion. Electronically Signed   By: Genevie Ann M.D.   On: 11/18/2015 07:12   Dg Chest Port 1 View  11/17/2015  CLINICAL DATA:  Acute respiratory failure, CHF, and coronary artery disease EXAM: PORTABLE CHEST 1 VIEW COMPARISON:  Portable chest x-ray of November 15, 2015 FINDINGS: The lungs are adequately inflated. There has been further development of alveolar opacity in the left mid and lower lung. The retrocardiac region is opacified and the left hemidiaphragm is largely obscured. There is persistent alveolar opacity in the right infrahilar region. The heart is mildly enlarged. The pulmonary vascularity is mildly engorged. The endotracheal tube tip projects 4.9 cm above the carina. The esophagogastric tube tip projects below the inferior margin of the image. The left internal jugular venous catheter tip projects over the midportion of the SVC. IMPRESSION: Slight interval worsening of alveolar opacities on the left compatible with pneumonia or less likely pulmonary edema. Stable subsegmental atelectasis or early infiltrate in the right infrahilar region. Low-grade CHF. Electronically Signed   By:  David  Martinique M.D.   On: 11/17/2015 07:11   Dg Chest Port 1 View  11/15/2015  CLINICAL DATA:  Pulmonary edema.  Recent cardiac catheterization. EXAM: PORTABLE CHEST 1 VIEW COMPARISON:  11/14/2015 and 11/13/2015. FINDINGS: 0524 hours. Endotracheal tube is unchanged, approximately 3 cm above the carina. Left IJ central venous catheter and nasogastric tube are unchanged. The heart size and mediastinal contours are stable. Bibasilar airspace opacities have not significantly changed. There is no pneumothorax or significant pleural effusion. IMPRESSION: Stable chest with bibasilar airspace opacities. Stable support system. Electronically Signed   By: Richardean Sale M.D.   On: 11/15/2015 09:14   Dg Chest Port 1 View  11/14/2015  CLINICAL DATA:  Hypoxia EXAM: PORTABLE CHEST 1 VIEW COMPARISON:  November 13, 2015 FINDINGS: Endotracheal tube tip is 3.1 cm above the carina. Nasogastric tube tip and side port below the diaphragm. Central catheter tip is in the superior vena cava. No pneumothorax. There is airspace consolidation in the left base. There is mild bibasilar interstitial edema. There is a small left pleural effusion. Heart is slightly enlarged with pulmonary vascularity within normal limits. No adenopathy. IMPRESSION: Tube and catheter positions as described without pneumothorax. Small left effusion with bibasilar edema.  Suspect a degree of congestive heart failure. Consolidation in the left base may represent alveolar edema but does suggest potential underlying pneumonia. No new opacity appreciable compared to 1 day prior. Electronically Signed   By: Lowella Grip III M.D.   On: 11/14/2015 07:21   Dg Chest Port 1 View  11/13/2015  CLINICAL DATA:  Cardiac arrest, acute respiratory failure, CHF. EXAM: PORTABLE CHEST 1 VIEW COMPARISON:  Portable chest x-ray of November 12, 2015 FINDINGS: The lungs are adequately inflated. The retrocardiac region remains dense on the left. The interstitial markings at the  right lung base are slightly more conspicuous today. There is no pneumothorax nor large pleural effusion. The heart is top-normal in size. The pulmonary vascularity is not clearly engorged. The endotracheal tube tip lies approximately 2.5 cm above the carina. The esophagogastric tube tip projects below the inferior margin of the image. The left internal jugular venous catheter tip projects over the midportion of the SVC. External pacemaker defibrillator pads are present. IMPRESSION: Increased interstitial density in both lung bases consistent with infiltrate or pulmonary edema. The upper lobe pulmonary vascularity is only minimally prominent. Stable support tubes. Electronically Signed   By: David  Martinique M.D.   On: 11/13/2015 07:14   Dg Chest Port 1 View  11/12/2015  CLINICAL DATA:  Central line placement EXAM: PORTABLE CHEST 1 VIEW COMPARISON:  11/11/2015 FINDINGS: The endotracheal tube is 4.7 cm above the carina. Nasogastric tube extends into the stomach. There is a new left jugular central line with tip in the low SVC. There is no pneumothorax. Ground-glass opacities are present in the central and basilar regions bilaterally, worsened. This could represent alveolar edema or infectious infiltrate. No large effusions. The left hemi thorax is superimposed by a percutaneous pacing patch. IMPRESSION: 1. New left jugular central line with tip in the low SVC. No pneumothorax. 2. Satisfactorily positioned endotracheal tube and nasogastric tube. 3. New/worsening central alveolar edema or infiltrate. Electronically Signed   By: Andreas Newport M.D.   On: 11/12/2015 06:00   Dg Chest Portable 1 View  11/11/2015  CLINICAL DATA:  Myocardial infarction.  Post resuscitation. EXAM: PORTABLE CHEST 1 VIEW COMPARISON:  03/29/2015 FINDINGS: Endotracheal tube tip is 2.6 cm above the carina. The nasogastric tube extends into the stomach. The lungs are clear except for mild atelectatic appearing linear basilar opacities on  the left. No confluent airspace consolidation. No large effusion. No pneumothorax. Mild central vascular prominence, which can be physiologic in the supine position. IMPRESSION: Support equipment appears satisfactorily positioned. Electronically Signed   By: Andreas Newport M.D.   On: 11/11/2015 22:12   Dg Abd Portable 1v  11/18/2015  CLINICAL DATA:  Orogastric tube placement EXAM: PORTABLE ABDOMEN - 1 VIEW COMPARISON:  CT abdomen and pelvis March 29, 2015 FINDINGS: Orogastric tube tip and side port are in the stomach. There is moderate stool throughout the colon. There is no bowel dilatation or air-fluid level suggesting obstruction. No free air is seen on this supine examination. IMPRESSION: Orogastric tube tip and side port in stomach. Bowel gas pattern unremarkable. Moderate stool throughout colon. Electronically Signed   By: Lowella Grip III M.D.   On: 11/18/2015 08:15    PHYSICAL EXAM   patient is intubated and sedated. His wife and daughter in the room. Cardiac exam reveals an S1 and S2. There is no significant edema.  TELEMETRY: I have reviewed telemetry today, November 19, 2015. There is sinus rhythm. He has had some intermittent sinus tachycardia when agitated.  There is a brief episode of bradycardia with a few dropped P waves at the time of his vasovagal episode.   ASSESSMENT AND PLAN:     Cardiac arrest with ventricular fibrillation ([name])     No return of ventricular arrhythmias in the hospital.    Rheumatoid arthritis ([place])    ST elevation (STEMI) myocardial infarction involving left anterior descending coronary artery ([name])   Acute respiratory failure with hypoxia ([name])   Shock circulatory ([name])   Coma ([name])   CAD S/P PCI OF pLAD with 3.5 mm x38 mm Promus DES (3.75 mm)    Cardiomyopathy, ischemic - EF 20-25% by LV Gram     He is diuresis.    Acute delirium     This is slowly improving.   Dola Argyle 11/19/2015 9:31 AM

## 2015-11-20 ENCOUNTER — Inpatient Hospital Stay (HOSPITAL_COMMUNITY): Payer: BLUE CROSS/BLUE SHIELD

## 2015-11-20 LAB — BASIC METABOLIC PANEL
Anion gap: 6 (ref 5–15)
BUN: 35 mg/dL — AB (ref 6–20)
CALCIUM: 8.3 mg/dL — AB (ref 8.9–10.3)
CO2: 32 mmol/L (ref 22–32)
Chloride: 106 mmol/L (ref 101–111)
Creatinine, Ser: 1.05 mg/dL (ref 0.61–1.24)
GFR calc Af Amer: >60 mL/min (ref >60)
GLUCOSE: 160 mg/dL — AB (ref 65–99)
POTASSIUM: 3.5 mmol/L (ref 3.5–5.1)
Sodium: 144 mmol/L (ref 135–145)

## 2015-11-20 LAB — CBC WITH DIFFERENTIAL/PLATELET
BASOS PCT: 0 %
Basophils Absolute: 0 10*3/uL (ref 0.0–0.1)
EOS ABS: 0.3 10*3/uL (ref 0.0–0.7)
Eosinophils Relative: 3 %
HCT: 33.9 % — ABNORMAL LOW (ref 39.0–52.0)
Hemoglobin: 10.9 g/dL — ABNORMAL LOW (ref 13.0–17.0)
Lymphocytes Relative: 15 %
Lymphs Abs: 1.4 10*3/uL (ref 0.7–4.0)
MCH: 30.8 pg (ref 26.0–34.0)
MCHC: 32.2 g/dL (ref 30.0–36.0)
MCV: 95.8 fL (ref 78.0–100.0)
MONO ABS: 0.7 10*3/uL (ref 0.1–1.0)
MONOS PCT: 7 %
Neutro Abs: 7.1 10*3/uL (ref 1.7–7.7)
Neutrophils Relative %: 75 %
Platelets: 205 10*3/uL (ref 150–400)
RBC: 3.54 MIL/uL — ABNORMAL LOW (ref 4.22–5.81)
RDW: 15.1 % (ref 11.5–15.5)
WBC: 9.6 10*3/uL (ref 4.0–10.5)

## 2015-11-20 LAB — GLUCOSE, CAPILLARY
GLUCOSE-CAPILLARY: 142 mg/dL — AB (ref 65–99)
GLUCOSE-CAPILLARY: 140 mg/dL — AB (ref 65–99)
GLUCOSE-CAPILLARY: 166 mg/dL — AB (ref 65–99)
Glucose-Capillary: 103 mg/dL — ABNORMAL HIGH (ref 65–99)
Glucose-Capillary: 142 mg/dL — ABNORMAL HIGH (ref 65–99)
Glucose-Capillary: 136 mg/dL — ABNORMAL HIGH (ref 65–99)
Glucose-Capillary: 88 mg/dL (ref 65–99)

## 2015-11-20 LAB — RENAL FUNCTION PANEL
Albumin: 2.1 g/dL — ABNORMAL LOW (ref 3.5–5.0)
Anion gap: 7 (ref 5–15)
BUN: 35 mg/dL — AB (ref 6–20)
CALCIUM: 8.3 mg/dL — AB (ref 8.9–10.3)
CO2: 31 mmol/L (ref 22–32)
CREATININE: 1.05 mg/dL (ref 0.61–1.24)
Chloride: 106 mmol/L (ref 101–111)
GFR calc Af Amer: >60 mL/min (ref >60)
GFR calc non Af Amer: >60 mL/min (ref >60)
GLUCOSE: 158 mg/dL — AB (ref 65–99)
Phosphorus: 3 mg/dL (ref 2.5–4.6)
Potassium: 3.5 mmol/L (ref 3.5–5.1)
SODIUM: 144 mmol/L (ref 135–145)

## 2015-11-20 LAB — MAGNESIUM: MAGNESIUM: 2.1 mg/dL (ref 1.7–2.4)

## 2015-11-20 LAB — PROCALCITONIN: PROCALCITONIN: 0.45 ng/mL

## 2015-11-20 LAB — TRIGLYCERIDES: TRIGLYCERIDES: 152 mg/dL — AB (ref <150)

## 2015-11-20 MED ORDER — POTASSIUM CHLORIDE 10 MEQ/50ML IV SOLN
10.0000 meq | INTRAVENOUS | Status: AC
  Administered 2015-11-20 (×2): 10 meq via INTRAVENOUS
  Filled 2015-11-20 (×2): qty 50

## 2015-11-20 MED ORDER — SODIUM PHOSPHATE 3 MMOLE/ML IV SOLN
10.0000 mmol | Freq: Once | INTRAVENOUS | Status: AC
  Administered 2015-11-20: 10 mmol via INTRAVENOUS
  Filled 2015-11-20: qty 3.33

## 2015-11-20 MED ORDER — FLUTICASONE PROPIONATE 50 MCG/ACT NA SUSP
2.0000 | Freq: Every day | NASAL | Status: DC
  Administered 2015-11-20 – 2015-12-04 (×12): 2 via NASAL
  Filled 2015-11-20 (×2): qty 16

## 2015-11-20 MED ORDER — TAMSULOSIN HCL 0.4 MG PO CAPS
0.4000 mg | ORAL_CAPSULE | Freq: Every day | ORAL | Status: DC
  Administered 2015-11-20 – 2015-12-04 (×15): 0.4 mg via ORAL
  Filled 2015-11-20 (×15): qty 1

## 2015-11-20 MED ORDER — SALINE SPRAY 0.65 % NA SOLN
1.0000 | NASAL | Status: DC | PRN
  Administered 2015-11-24: 1 via NASAL
  Filled 2015-11-20: qty 44

## 2015-11-20 NOTE — Progress Notes (Signed)
[facility] ADULT ICU REPLACEMENT PROTOCOL FOR AM LAB REPLACEMENT ONLY  The patient does apply for the [facility] Adult ICU Electrolyte Replacment Protocol based on the criteria listed below:   1. Is GFR >/= 40 ml/min? Yes.    Patient's GFR today is 35 2. Is urine output >/= 0.5 ml/kg/hr for the last 6 hours? Yes.   Patient's UOP is 0.9 ml/kg/hr 3. Is BUN < 60 mg/dL? Yes.    Patient's BUN today is 35 4. Abnormal electrolyte(s): K3.5,Phos2.1 5. Ordered repletion with: Per protocol 6. If a panic level lab has been reported, has the CCM MD in charge been notified? Yes.  .   Physician:  Calton Dach, MD  [place] Clock 11/20/2015 6:03 AM

## 2015-11-20 NOTE — Progress Notes (Signed)
Patient Name:  [name], DOB: [birth year], MRN: [id number] Primary Doctor: [name], MD Primary Cardiologist:   Date: 11/20/2015   SUBJECTIVE: There is overall slow continued improvement. The patient is still intubated. His mental status is improving. He did have an episode of agitation yesterday afternoon around the time that a chest x-ray was being done.    Past Medical History  Diagnosis Date  . Rheumatoid arthritis(714.0) 1998  . Nephrolithiasis 1968  . Insomnia   . Allergic rhinitis   . Impaired glucose tolerance   . Hypertension   . ST elevation (STEMI) myocardial infarction involving left anterior descending coronary artery ([place]) 11/11/2015    100% LAD, Cardiogenic Shock. -- EF ~20-25%  . Cardiac arrest with ventricular fibrillation ([place]) 11/11/2015  . CAD S/P PCI OF pLAD with 3.5 mm x38 mm Promus DES (3.75 mm) 11/12/2015  . Cardiomyopathy, ischemic - EF 20-25% by LV Gram 11/12/2015    There is severe left ventricular systolic dysfunction. The left ventricular ejection fraction is 25-35% by visual estimate. There are wall motion abnormalities in the left ventricle. There are segmental wall motion abnormalities in the left ventricle. The anterolateral and periapical LV walls are akinetic with an estimated LVEF of 25%    Filed Vitals:   11/20/15 0600 11/20/15 0700 11/20/15 0735 11/20/15 0806  BP: 100/56 88/55 88/55  93/55  Pulse: 67 64 62 68  Temp:    99.2 F (37.3 C)  TempSrc:    Oral  Resp: 20 20 20 20   Height:      Weight:      SpO2: 95% 95% 96% 94%    Intake/Output Summary (Last 24 hours) at 11/20/15 1030 Last data filed at 11/20/15 1000  Gross per 24 hour  Intake 2551.87 ml  Output   3925 ml  Net -1373.13 ml   Filed Weights   11/18/15 0617 11/19/15 0441 11/20/15 0500  Weight: 198 lb 6.6 oz (90 kg) 196 lb 13.9 oz (89.3 kg) 190 lb 0.6 oz (86.2 kg)     LABS: Basic Metabolic Panel:  Recent Labs  11/19/15 0455 11/20/15 0410    NA 142 144  144  K 3.2* 3.5  3.5  CL 106 106  106  CO2 30 32  31  GLUCOSE 145* 160*  158*  BUN 32* 35*  35*  CREATININE 0.88 1.05  1.05  CALCIUM 8.0* 8.3*  8.3*  MG 2.2 2.1  PHOS 3.0 3.0   Liver Function Tests:  Recent Labs  11/19/15 0455 11/20/15 0410  ALBUMIN 2.0* 2.1*   No results for input(s): LIPASE, AMYLASE in the last 72 hours. CBC:  Recent Labs  11/19/15 0455 11/20/15 0410  WBC 9.0 9.6  NEUTROABS 6.6 7.1  HGB 10.7* 10.9*  HCT 32.8* 33.9*  MCV 94.3 95.8  PLT 178 205   Cardiac Enzymes: No results for input(s): CKTOTAL, CKMB, CKMBINDEX, TROPONINI in the last 72 hours. BNP: Invalid input(s): POCBNP D-Dimer: No results for input(s): DDIMER in the last 72 hours. Thyroid Function Tests: No results for input(s): TSH, T4TOTAL, T3FREE, THYROIDAB in the last 72 hours.  Invalid input(s): FREET3  RADIOLOGY: Dg Chest 1 View  11/19/2015  CLINICAL DATA:  69 year old male status post V fib arrest. Respiratory failure. Initial encounter. EXAM: CHEST 1 VIEW COMPARISON:  11/18/2015 and earlier. FINDINGS: Portable AP semi upright view at 1007 hours. Endotracheal tube tip remains in good position between the clavicles and carina. Enteric tube courses to the abdomen, tip not included.  Stable left IJ central line. Improved right lung base ventilation, regressed opacity. Continued dense left lung base/retrocardiac opacity with some air bronchograms. No pneumothorax or pulmonary edema. Probable small pleural effusions. Stable cardiac size and mediastinal contours. IMPRESSION: 1.  Stable lines and tubes. 2. Interval improved right lung base ventilation. Residual left lower lobe collapse/consolidation. Probable small effusions. Electronically Signed   By: Genevie Ann M.D.   On: 11/19/2015 10:15   Ct Head Wo Contrast  11/11/2015  CLINICAL DATA:  69 year old male with cardiac arrest. EXAM: CT HEAD WITHOUT CONTRAST TECHNIQUE: Contiguous axial images were obtained from the base of the  skull through the vertex without intravenous contrast. COMPARISON:  Head CT dated 03/29/2015 FINDINGS: There is slight prominence of the ventricles and sulci compatible with age-related volume loss. Mild periventricular and deep white matter hypodensities represent chronic microvascular ischemic changes. There is no intracranial hemorrhage. No mass effect or midline shift identified. The visualized paranasal sinuses and mastoid air cells are well aerated. The calvarium is intact. IMPRESSION: No acute intracranial pathology. Mild age-related atrophy and chronic microvascular ischemic disease. If symptoms persist and there are no contraindications, MRI may provide better evaluation if clinically indicated Electronically Signed   By: Anner Crete M.D.   On: 11/11/2015 23:44   Dg Chest Port 1 View  11/20/2015  CLINICAL DATA:  Respiratory failure. EXAM: PORTABLE CHEST 1 VIEW COMPARISON:  11/19/2015.  11/17/2015.  CT 03/29/2015. FINDINGS: Endotracheal tube, NG tube, left IJ line stable position. Worsening bibasilar atelectasis and or infiltrates/edema. Small left pleural effusion. No pneumothorax. Stable cardiomegaly. Multiple left rib fractures again noted. IMPRESSION: 1. Lines and tubes in stable position. 2. Progressive bibasilar atelectasis and/or infiltrates/edema. Small left pleural effusion . 3. Stable cardiomegaly. Electronically Signed   By: Marcello Moores  Register   On: 11/20/2015 07:08   Dg Chest Port 1 View  11/18/2015  CLINICAL DATA:  69 year old male with acute respiratory failure. Status post cardiac arrest. Initial encounter. EXAM: PORTABLE CHEST 1 VIEW COMPARISON:  11/17/2015 and earlier. FINDINGS: Portable AP semi upright view at 0514 hours. Stable endotracheal tube. Stable visualized enteric tube coursing to the abdomen. Stable left IJ central line. Increased veiling opacity at the right lung base. Continued dense retrocardiac opacity. Continued left and new right perihilar interstitial opacity. No  pneumothorax. Small left side effusion suspected. IMPRESSION: 1.  Stable lines and tubes. 2. Interval increased bilateral perihilar opacity and right pleural effusion. Favor pulmonary interstitial edema over infection. 3. Continued dense lower lobe collapse/consolidation. Suspect stable small left effusion. Electronically Signed   By: Genevie Ann M.D.   On: 11/18/2015 07:12   Dg Chest Port 1 View  11/17/2015  CLINICAL DATA:  Acute respiratory failure, CHF, and coronary artery disease EXAM: PORTABLE CHEST 1 VIEW COMPARISON:  Portable chest x-ray of November 15, 2015 FINDINGS: The lungs are adequately inflated. There has been further development of alveolar opacity in the left mid and lower lung. The retrocardiac region is opacified and the left hemidiaphragm is largely obscured. There is persistent alveolar opacity in the right infrahilar region. The heart is mildly enlarged. The pulmonary vascularity is mildly engorged. The endotracheal tube tip projects 4.9 cm above the carina. The esophagogastric tube tip projects below the inferior margin of the image. The left internal jugular venous catheter tip projects over the midportion of the SVC. IMPRESSION: Slight interval worsening of alveolar opacities on the left compatible with pneumonia or less likely pulmonary edema. Stable subsegmental atelectasis or early infiltrate in the right infrahilar region.  Low-grade CHF. Electronically Signed   By: David  Martinique M.D.   On: 11/17/2015 07:11   Dg Chest Port 1 View  11/15/2015  CLINICAL DATA:  Pulmonary edema.  Recent cardiac catheterization. EXAM: PORTABLE CHEST 1 VIEW COMPARISON:  11/14/2015 and 11/13/2015. FINDINGS: 0524 hours. Endotracheal tube is unchanged, approximately 3 cm above the carina. Left IJ central venous catheter and nasogastric tube are unchanged. The heart size and mediastinal contours are stable. Bibasilar airspace opacities have not significantly changed. There is no pneumothorax or significant pleural  effusion. IMPRESSION: Stable chest with bibasilar airspace opacities. Stable support system. Electronically Signed   By: Richardean Sale M.D.   On: 11/15/2015 09:14   Dg Chest Port 1 View  11/14/2015  CLINICAL DATA:  Hypoxia EXAM: PORTABLE CHEST 1 VIEW COMPARISON:  November 13, 2015 FINDINGS: Endotracheal tube tip is 3.1 cm above the carina. Nasogastric tube tip and side port below the diaphragm. Central catheter tip is in the superior vena cava. No pneumothorax. There is airspace consolidation in the left base. There is mild bibasilar interstitial edema. There is a small left pleural effusion. Heart is slightly enlarged with pulmonary vascularity within normal limits. No adenopathy. IMPRESSION: Tube and catheter positions as described without pneumothorax. Small left effusion with bibasilar edema. Suspect a degree of congestive heart failure. Consolidation in the left base may represent alveolar edema but does suggest potential underlying pneumonia. No new opacity appreciable compared to 1 day prior. Electronically Signed   By: Lowella Grip III M.D.   On: 11/14/2015 07:21   Dg Chest Port 1 View  11/13/2015  CLINICAL DATA:  Cardiac arrest, acute respiratory failure, CHF. EXAM: PORTABLE CHEST 1 VIEW COMPARISON:  Portable chest x-ray of November 12, 2015 FINDINGS: The lungs are adequately inflated. The retrocardiac region remains dense on the left. The interstitial markings at the right lung base are slightly more conspicuous today. There is no pneumothorax nor large pleural effusion. The heart is top-normal in size. The pulmonary vascularity is not clearly engorged. The endotracheal tube tip lies approximately 2.5 cm above the carina. The esophagogastric tube tip projects below the inferior margin of the image. The left internal jugular venous catheter tip projects over the midportion of the SVC. External pacemaker defibrillator pads are present. IMPRESSION: Increased interstitial density in both lung bases  consistent with infiltrate or pulmonary edema. The upper lobe pulmonary vascularity is only minimally prominent. Stable support tubes. Electronically Signed   By: David  Martinique M.D.   On: 11/13/2015 07:14   Dg Chest Port 1 View  11/12/2015  CLINICAL DATA:  Central line placement EXAM: PORTABLE CHEST 1 VIEW COMPARISON:  11/11/2015 FINDINGS: The endotracheal tube is 4.7 cm above the carina. Nasogastric tube extends into the stomach. There is a new left jugular central line with tip in the low SVC. There is no pneumothorax. Ground-glass opacities are present in the central and basilar regions bilaterally, worsened. This could represent alveolar edema or infectious infiltrate. No large effusions. The left hemi thorax is superimposed by a percutaneous pacing patch. IMPRESSION: 1. New left jugular central line with tip in the low SVC. No pneumothorax. 2. Satisfactorily positioned endotracheal tube and nasogastric tube. 3. New/worsening central alveolar edema or infiltrate. Electronically Signed   By: Andreas Newport M.D.   On: 11/12/2015 06:00   Dg Chest Portable 1 View  11/11/2015  CLINICAL DATA:  Myocardial infarction.  Post resuscitation. EXAM: PORTABLE CHEST 1 VIEW COMPARISON:  03/29/2015 FINDINGS: Endotracheal tube tip is 2.6 cm above the  carina. The nasogastric tube extends into the stomach. The lungs are clear except for mild atelectatic appearing linear basilar opacities on the left. No confluent airspace consolidation. No large effusion. No pneumothorax. Mild central vascular prominence, which can be physiologic in the supine position. IMPRESSION: Support equipment appears satisfactorily positioned. Electronically Signed   By: Andreas Newport M.D.   On: 11/11/2015 22:12   Dg Abd Portable 1v  11/18/2015  CLINICAL DATA:  Orogastric tube placement EXAM: PORTABLE ABDOMEN - 1 VIEW COMPARISON:  CT abdomen and pelvis March 29, 2015 FINDINGS: Orogastric tube tip and side port are in the stomach. There is  moderate stool throughout the colon. There is no bowel dilatation or air-fluid level suggesting obstruction. No free air is seen on this supine examination. IMPRESSION: Orogastric tube tip and side port in stomach. Bowel gas pattern unremarkable. Moderate stool throughout colon. Electronically Signed   By: Lowella Grip III M.D.   On: 11/18/2015 08:15    PHYSICAL EXAM patient is intubated. He does respond to his family. Lungs reveal scattered rhonchi. Cardiac exam reveals S1 and S2. I spoke with the patient's wife in his room today.   TELEMETRY: I have reviewed telemetry today November 20, 2015. There is sinus rhythm with mild sinus bradycardia.  ASSESSMENT AND PLAN:     Cardiac arrest with ventricular fibrillation ([name])     Continued slow improvement    Rheumatoid arthritis ([name])    ST elevation (STEMI) myocardial infarction involving left anterior descending coronary artery ([place])     He is receiving aspirin and atorvastatin.    Acute respiratory failure with hypoxia ([place])     He is still intubated. Critical care medicine is working carefully with him to see if he can be extubated.      CAD S/P PCI OF pLAD with 3.5 mm x38 mm Promus DES (3.75 mm)    Cardiomyopathy, ischemic - EF 20-25% by LV Gram     His blood pressure is too low to use an ACE inhibitor at this point. In addition his heart rate is slow and a beta blocker cannot be used.    Acute delirium     There is continued slow improvement.   Dola Argyle 11/20/2015 10:30 AM

## 2015-11-20 NOTE — Procedures (Signed)
Extubation Procedure Note  Patient Details:   Name: [name] DOB: 11/27/1946 MRN: [id number]   Airway Documentation:     Evaluation  O2 sats: stable throughout Complications: No apparent complications Patient did tolerate procedure well. Bilateral Breath Sounds: Rhonchi Suctioning: Airway Yes   Positive cuff leak test, stable throughout, extubated to 3L [place]. Will continue to monitor.  Martin[place] R Jamesina Gaugh 11/20/2015, 11:50 AM

## 2015-11-20 NOTE — Progress Notes (Signed)
PULMONARY / CRITICAL CARE MEDICINE   Name: [name] MRN: [id number] DOB: June 01, 1946    ADMISSION DATE:  11/11/2015 CONSULTATION DATE:  11/11/2015  REFERRING MD:  Dr. Burt Knack  CHIEF COMPLAINT:  Cardiac arrest  HISTORY OF PRESENT ILLNESS:  69 year old male with PMH of RA and HTN. He presented to [facility] ED 11/29 after a witnessed cardiac arrest at home. He has had intermittent chest and arm pain for about 2 weeks status post mechanical fall. 11/29 chest pain acutely worsened and he unfortunately suffered a cardiac arrest, which was witnessed by his wife and EMS was called. Family initiated CPR. EMS arrived and AED was placed advising shock. 2 shocks were delivered and ROSC was achieved. 2 rounds epinephrine also administerred. EKG in the field was concerning for STEMI and code STEMI was called. In ED patient was unresponsive, he was intubated and taken emergently to cardiac cath lab where he remains at this time. Occlusive LAD lesion identified and intervened upon. PCCM consulted for ICU care. Total downtime estimated at less than 15 mins.   SUBJECTIVE:  On minimal vent setting now. Doing better on weaning trials. Levophed started yesterday for low BP. Continues on lasix diuresis.  VITAL SIGNS: BP 93/55 mmHg  Pulse 68  Temp(Src) 99.2 F (37.3 C) (Oral)  Resp 20  Ht 5\' 7"  (1.702 m)  Wt 190 lb 0.6 oz (86.2 kg)  BMI 29.76 kg/m2  SpO2 94%  HEMODYNAMICS: CVP:  [6 mmHg-8 mmHg] 8 mmHg  VENTILATOR SETTINGS: Vent Mode:  [-] PRVC FiO2 (%):  [40 %-50 %] 40 % Set Rate:  [20 bmp] 20 bmp Vt Set:  [550 mL] 550 mL PEEP:  [5 cmH20] 5 cmH20 Plateau Pressure:  [16 cmH20-20 cmH20] 18 cmH20  INTAKE / OUTPUT: I/O last 3 completed shifts: In: 4327.3 [I.V.:2152; [id number]; IV Piggyback:303.3] Out: 6015 [Urine:6015]  PHYSICAL EXAMINATION: General:Sedated, calm, responsive Neuro: Open eyes to command, nods to questions HEENT:ET tube in place, moist mucous membranes  Cardiovascular:  S1-S2, regular rate and rhythm, no murmurs rubs gallops  Lungs:Clear anteriorly  Abdomen: Soft, positive bowel sounds  Skin: Intact   LABS:  CBC  Recent Labs Lab 11/18/15 0620 11/19/15 0455 11/20/15 0410  WBC 7.9 9.0 9.6  HGB 10.7* 10.7* 10.9*  HCT 31.2* 32.8* 33.9*  PLT 156 178 205   Coag's No results for input(s): APTT, INR in the last 168 hours. BMET  Recent Labs Lab 11/18/15 0620 11/19/15 0455 11/20/15 0410  NA 144 142 144  144  K 3.8 3.2* 3.5  3.5  CL 111 106 106  106  CO2 29 30 32  31  BUN 31* 32* 35*  35*  CREATININE 0.95 0.88 1.05  1.05  GLUCOSE 151* 145* 160*  158*   Electrolytes  Recent Labs Lab 11/18/15 0620 11/19/15 0455 11/20/15 0410  CALCIUM 8.1* 8.0* 8.3*  8.3*  MG 2.2 2.2 2.1  PHOS 2.7 3.0 3.0   Sepsis Markers  Recent Labs Lab 11/14/15 0400  PROCALCITON 0.13   ABG  Recent Labs Lab 11/13/15 1849 11/18/15 1150  PHART 7.309* 7.470*  PCO2ART 38.2 36.4  PO2ART 95.0 77.3*   Liver Enzymes  Recent Labs Lab 11/14/15 0400 11/15/15 0420  11/18/15 0620 11/19/15 0455 11/20/15 0410  AST 39 133*  --   --   --   --   ALT 80* 78*  --   --   --   --   ALKPHOS 53 56  --   --   --   --  BILITOT 0.4 0.8  --   --   --   --   ALBUMIN 2.5* 2.6*  < > 2.1* 2.0* 2.1*  < > = values in this interval not displayed. Cardiac Enzymes No results for input(s): TROPONINI, PROBNP in the last 168 hours. Glucose  Recent Labs Lab 11/19/15 1215 11/19/15 1626 11/19/15 2029 11/19/15 2350 11/20/15 0350 11/20/15 0810  GLUCAP 133* 123* 142* 103* 136* 142*    Imaging Dg Chest 1 View  11/19/2015  CLINICAL DATA:  69 year old male status post V fib arrest. Respiratory failure. Initial encounter. EXAM: CHEST 1 VIEW COMPARISON:  11/18/2015 and earlier. FINDINGS: Portable AP semi upright view at 1007 hours. Endotracheal tube tip remains in good position between the clavicles and carina. Enteric tube courses to the abdomen, tip not included. Stable  left IJ central line. Improved right lung base ventilation, regressed opacity. Continued dense left lung base/retrocardiac opacity with some air bronchograms. No pneumothorax or pulmonary edema. Probable small pleural effusions. Stable cardiac size and mediastinal contours. IMPRESSION: 1.  Stable lines and tubes. 2. Interval improved right lung base ventilation. Residual left lower lobe collapse/consolidation. Probable small effusions. Electronically Signed   By: Genevie Ann M.D.   On: 11/19/2015 10:15   Dg Chest Port 1 View  11/20/2015  CLINICAL DATA:  Respiratory failure. EXAM: PORTABLE CHEST 1 VIEW COMPARISON:  11/19/2015.  11/17/2015.  CT 03/29/2015. FINDINGS: Endotracheal tube, NG tube, left IJ line stable position. Worsening bibasilar atelectasis and or infiltrates/edema. Small left pleural effusion. No pneumothorax. Stable cardiomegaly. Multiple left rib fractures again noted. IMPRESSION: 1. Lines and tubes in stable position. 2. Progressive bibasilar atelectasis and/or infiltrates/edema. Small left pleural effusion . 3. Stable cardiomegaly. Electronically Signed   By: Marcello Moores  Register   On: 11/20/2015 07:08     STUDIES:  November 14, 2023  LHC - stent lad 11/14/2023  CT head - no acute abnormality 11/30  eeg - no seizure activity. suppression 12/03  Port CXR 12/3 - Bilat lower lung opacity. 12/04  Rewarmed   CULTURES:   ANTIBIOTICS: Levaquin 11/30 >> 12/4  SIGNIFICANT EVENTS: 11-14-23 cardiac arrest > cath lab, out to ICU, therapeutic hypothermia.  12/02 completed rewarming  LINES/TUBES: ETT 14-Nov-2023 >>  CVL Nov 14, 2023 >> Left radial Nov 14, 2023 >> 12/3   ASSESSMENT / PLAN:  PULMONARY A: Acute Hypoxic Respiratory Failure Severe CAP vs Aspiration Pneumonia   P:   MV support, 8 cc/kg Wean PEEP / FiO2 for sats > 92% Daily SBT / WUA as tolerated. ICU delirium is a significant barrier to progress  Intermittent CXR  VAP preventions measures   CARDIOVASCULAR A:  Cardiac arrest - VT/VF STEMI s/p PCI  2023/11/14 HTN  P:  Care per Cardiology Wyn Quaker, Lopressor & Lipitor ICU monitoring of hemodynamics  Follow triglycerides while on propofol  Reduce Lasix dose to 40 mg qd.   RENAL A:   Hypokalemia  Hypophosphatemia - Resolved. Hypomagnesemia - Resolved.  P:   Monitor UOP / BMP Renal function is stable  Replete potassium  GASTROINTESTINAL A:   Transaminitis - Shock liver. Resolving. At Risk Protein Calorie Malnutrition  P:   NPO Pepcid IV q12hr Tube feeds at goal  HEMATOLOGIC A:   Leukocytosis - Improving. Suspect stress response. Anemia  P:  Follow CBC Antiplatelet / anticoagulation per Cardiology Heparin [place] q8hr for DVT prophylaxis   INFECTIOUS A:   Possible CAP Higher risk for infection from cooling  P:   Finished a course of levofloxacin  Monitor fever curve / WBC  ENDOCRINE A:   Hyperglycemia - No H/O DM.  P:   Accu-Checks q4hr SSI coverage  AUTOIMMUNE:  A:  Rheumatoid Arthritis - on DMARD & Methotrexate at baseline   P:  Hold home medications  NEUROLOGIC A:   Concern for Anoxic Brain Injury - EEG negative. Following commands 12/3.  P:   RASS goal:  0 to -1  On Precedex, fentanyl., propofol Started on Seroquel and trazodone. Haldol PRN. QTC okay Daily WUA / SBTif agitation continues to be a barrier to weaning then he may need a trach.  FAMILY - Updates: Wife and daughter updated 12/8 - Inter-disciplinary family meet or Palliative Care meeting due by:  12/7  Marshell Garfinkel MD [place] [facility] Pager [phone] If no answer or after 3pm call: [phone] 11/20/2015, 10:01 AM    TODAY'S SUMMARY:  69 year old male post arrest. S/P rewarming. Difficulty with agitation, sedation are barriers to weaning.   Critical care time-35 minutes  Marshell Garfinkel MD [place] [facility] Pager [phone] If no answer or after 3pm call: [phone] 11/20/2015, 10:01 AM

## 2015-11-21 ENCOUNTER — Inpatient Hospital Stay (HOSPITAL_COMMUNITY): Payer: BLUE CROSS/BLUE SHIELD

## 2015-11-21 DIAGNOSIS — E876 Hypokalemia: Secondary | ICD-10-CM

## 2015-11-21 DIAGNOSIS — R05 Cough: Secondary | ICD-10-CM

## 2015-11-21 DIAGNOSIS — R059 Cough, unspecified: Secondary | ICD-10-CM

## 2015-11-21 LAB — CBC WITH DIFFERENTIAL/PLATELET
BASOS ABS: 0 10*3/uL (ref 0.0–0.1)
Basophils Relative: 0 %
EOS ABS: 0 10*3/uL (ref 0.0–0.7)
EOS PCT: 0 %
HCT: 37.2 % — ABNORMAL LOW (ref 39.0–52.0)
HEMOGLOBIN: 12.4 g/dL — AB (ref 13.0–17.0)
LYMPHS PCT: 5 %
Lymphs Abs: 0.9 10*3/uL (ref 0.7–4.0)
MCH: 31.6 pg (ref 26.0–34.0)
MCHC: 33.3 g/dL (ref 30.0–36.0)
MCV: 94.9 fL (ref 78.0–100.0)
Monocytes Absolute: 1.1 10*3/uL — ABNORMAL HIGH (ref 0.1–1.0)
Monocytes Relative: 6 %
NEUTROS PCT: 89 %
Neutro Abs: 15.7 10*3/uL — ABNORMAL HIGH (ref 1.7–7.7)
PLATELETS: 231 10*3/uL (ref 150–400)
RBC: 3.92 MIL/uL — AB (ref 4.22–5.81)
RDW: 15 % (ref 11.5–15.5)
WBC: 17.7 10*3/uL — AB (ref 4.0–10.5)

## 2015-11-21 LAB — GLUCOSE, CAPILLARY
GLUCOSE-CAPILLARY: 158 mg/dL — AB (ref 65–99)
GLUCOSE-CAPILLARY: 165 mg/dL — AB (ref 65–99)
Glucose-Capillary: 123 mg/dL — ABNORMAL HIGH (ref 65–99)
Glucose-Capillary: 135 mg/dL — ABNORMAL HIGH (ref 65–99)
Glucose-Capillary: 146 mg/dL — ABNORMAL HIGH (ref 65–99)
Glucose-Capillary: 177 mg/dL — ABNORMAL HIGH (ref 65–99)

## 2015-11-21 LAB — PROCALCITONIN: PROCALCITONIN: 0.32 ng/mL

## 2015-11-21 LAB — RENAL FUNCTION PANEL
ALBUMIN: 2.7 g/dL — AB (ref 3.5–5.0)
Anion gap: 8 (ref 5–15)
BUN: 30 mg/dL — AB (ref 6–20)
CHLORIDE: 111 mmol/L (ref 101–111)
CO2: 29 mmol/L (ref 22–32)
Calcium: 8.6 mg/dL — ABNORMAL LOW (ref 8.9–10.3)
Creatinine, Ser: 0.99 mg/dL (ref 0.61–1.24)
Glucose, Bld: 149 mg/dL — ABNORMAL HIGH (ref 65–99)
PHOSPHORUS: 3 mg/dL (ref 2.5–4.6)
POTASSIUM: 3.3 mmol/L — AB (ref 3.5–5.1)
Sodium: 148 mmol/L — ABNORMAL HIGH (ref 135–145)

## 2015-11-21 LAB — BASIC METABOLIC PANEL
ANION GAP: 9 (ref 5–15)
BUN: 31 mg/dL — ABNORMAL HIGH (ref 6–20)
CALCIUM: 8.7 mg/dL — AB (ref 8.9–10.3)
CO2: 29 mmol/L (ref 22–32)
CREATININE: 0.96 mg/dL (ref 0.61–1.24)
Chloride: 110 mmol/L (ref 101–111)
Glucose, Bld: 148 mg/dL — ABNORMAL HIGH (ref 65–99)
Potassium: 3.4 mmol/L — ABNORMAL LOW (ref 3.5–5.1)
SODIUM: 148 mmol/L — AB (ref 135–145)

## 2015-11-21 LAB — MAGNESIUM: MAGNESIUM: 2.2 mg/dL (ref 1.7–2.4)

## 2015-11-21 LAB — PHOSPHORUS: PHOSPHORUS: 3 mg/dL (ref 2.5–4.6)

## 2015-11-21 LAB — EXPECTORATED SPUTUM ASSESSMENT W REFEX TO RESP CULTURE

## 2015-11-21 LAB — EXPECTORATED SPUTUM ASSESSMENT W GRAM STAIN, RFLX TO RESP C

## 2015-11-21 LAB — TRIGLYCERIDES: Triglycerides: 158 mg/dL — ABNORMAL HIGH (ref <150)

## 2015-11-21 MED ORDER — WHITE PETROLATUM GEL
Status: AC
  Administered 2015-11-21: 0.2
  Filled 2015-11-21: qty 1

## 2015-11-21 MED ORDER — CETYLPYRIDINIUM CHLORIDE 0.05 % MT LIQD
7.0000 mL | Freq: Two times a day (BID) | OROMUCOSAL | Status: DC
  Administered 2015-11-22 – 2015-11-24 (×6): 7 mL via OROMUCOSAL

## 2015-11-21 MED ORDER — VANCOMYCIN HCL IN DEXTROSE 1-5 GM/200ML-% IV SOLN
1000.0000 mg | Freq: Two times a day (BID) | INTRAVENOUS | Status: DC
  Administered 2015-11-21 – 2015-11-23 (×4): 1000 mg via INTRAVENOUS
  Filled 2015-11-21 (×5): qty 200

## 2015-11-21 MED ORDER — RAMIPRIL 2.5 MG PO CAPS
2.5000 mg | ORAL_CAPSULE | Freq: Every day | ORAL | Status: DC
  Administered 2015-11-21 – 2015-11-24 (×4): 2.5 mg via ORAL
  Filled 2015-11-21 (×6): qty 1

## 2015-11-21 MED ORDER — POTASSIUM CHLORIDE CRYS ER 20 MEQ PO TBCR
40.0000 meq | EXTENDED_RELEASE_TABLET | Freq: Once | ORAL | Status: AC
  Administered 2015-11-21: 40 meq via ORAL
  Filled 2015-11-21: qty 2

## 2015-11-21 MED ORDER — POTASSIUM CHLORIDE 10 MEQ/50ML IV SOLN
10.0000 meq | INTRAVENOUS | Status: AC
  Administered 2015-11-21 (×2): 10 meq via INTRAVENOUS
  Filled 2015-11-21 (×2): qty 50

## 2015-11-21 MED ORDER — CHLORHEXIDINE GLUCONATE 0.12 % MT SOLN
15.0000 mL | Freq: Two times a day (BID) | OROMUCOSAL | Status: DC
  Administered 2015-11-21 – 2015-11-24 (×7): 15 mL via OROMUCOSAL
  Filled 2015-11-21 (×4): qty 15

## 2015-11-21 MED ORDER — SODIUM CHLORIDE 0.9 % IV SOLN
1500.0000 mg | Freq: Once | INTRAVENOUS | Status: AC
  Administered 2015-11-21: 1500 mg via INTRAVENOUS
  Filled 2015-11-21: qty 1500

## 2015-11-21 MED ORDER — CARVEDILOL 3.125 MG PO TABS
3.1250 mg | ORAL_TABLET | Freq: Two times a day (BID) | ORAL | Status: DC
  Administered 2015-11-21 (×2): 3.125 mg via ORAL
  Filled 2015-11-21 (×2): qty 1

## 2015-11-21 MED ORDER — VITAL AF 1.2 CAL PO LIQD
1000.0000 mL | ORAL | Status: DC
  Administered 2015-11-21 – 2015-11-23 (×3): 1000 mL
  Filled 2015-11-21 (×7): qty 1000

## 2015-11-21 MED ORDER — AZTREONAM 1 G IJ SOLR
1.0000 g | Freq: Three times a day (TID) | INTRAMUSCULAR | Status: AC
  Administered 2015-11-21 – 2015-11-28 (×23): 1 g via INTRAVENOUS
  Filled 2015-11-21 (×23): qty 1

## 2015-11-21 NOTE — Progress Notes (Addendum)
Nutrition Follow-up  INTERVENTION:   After Cortrak placement confirmed:  Vital AF 1.2 @ 70 ml/hr Provides: 2016 kcal, 126 grams protein, and 1362 ml H2O.   NUTRITION DIAGNOSIS:   Inadequate oral intake related to inability to eat as evidenced by NPO status. Ongoing.   GOAL:   Patient will meet greater than or equal to 90% of their needs Not met.   MONITOR:   Diet advancement, Labs, Weight trends, I & O's  ASSESSMENT:   69 y.o. male w/ PMHx significant for rheumatoid arthritis and HTN who presented to [facility] on 11/11/2015 after an out-of-hospital cardiac arrest. The patient reportedly had chest and arm pain intermittently for 2 weeks after sustaining a mechanical fall. Today he had worsening of his chest pain and he ultimately arrested at home, witnessed by his wife.  Treated for severe CAP vs aspiration pneumonia 12/8 extubated, awaiting swallow evaluation, update: pt failed swallow this RD placed Cortrak feeding tube into small bowel and ordered TF.  Labs reviewed: sodium elevated (148), potassium low (3.4, magnesium and phosphorus are WNL CBG's: 146-177  Diet Order:  Diet NPO time specified NPO  Skin:  Reviewed, no issues  Last BM:  12/9  Height:   Ht Readings from Last 1 Encounters:  11/18/15 '5\' 7"'  (1.702 m)   Weight:   Wt Readings from Last 1 Encounters:  11/21/15 181 lb 3.5 oz (82.2 kg)   Ideal Body Weight:  67.27 kg  BMI:  Body mass index is 28.38 kg/(m^2).  Estimated Nutritional Needs:   Kcal:  1900-2100  Protein:  95-110 grams  Fluid:  > 1.9 L/day  EDUCATION NEEDS:   No education needs identified at this time  Ravanna, Melvina, [place] Pager [phone] After Hours Pager

## 2015-11-21 NOTE — Progress Notes (Signed)
PULMONARY / CRITICAL CARE MEDICINE   Name: [name] MRN: [id number] DOB: [birth year]    ADMISSION DATE:  11/11/2015 CONSULTATION DATE:  11/11/2015  REFERRING MD:  Dr. Burt Knack  CHIEF COMPLAINT:  Cardiac arrest  HISTORY OF PRESENT ILLNESS:  69 year old male with PMH of RA and HTN. He presented to [facility] ED 11/29 after a witnessed cardiac arrest at home. He has had intermittent chest and arm pain for about 2 weeks status post mechanical fall. 11/29 chest pain acutely worsened and he unfortunately suffered a cardiac arrest, which was witnessed by his wife and EMS was called. Family initiated CPR. EMS arrived and AED was placed advising shock. 2 shocks were delivered and ROSC was achieved. 2 rounds epinephrine also administerred. EKG in the field was concerning for STEMI and code STEMI was called. In ED patient was unresponsive, he was intubated and taken emergently to cardiac cath lab where he remains at this time. Occlusive LAD lesion identified and intervened upon. PCCM consulted for ICU care. Total downtime estimated at less than 15 mins.   SUBJECTIVE:  Extubated yesterday. More alert and oriented, less delirious. Has cough productive of purulent sputum. Elevated WBC count noted  VITAL SIGNS: BP 129/71 mmHg  Pulse 112  Temp(Src) 98.7 F (37.1 C) (Oral)  Resp 25  Ht 5\' 7"  (1.702 m)  Wt 181 lb 3.5 oz (82.2 kg)  BMI 28.38 kg/m2  SpO2 97%  HEMODYNAMICS: CVP:  [5 mmHg-8 mmHg] 8 mmHg  VENTILATOR SETTINGS: Vent Mode:  [-] CPAP;PSV FiO2 (%):  [40 %] 40 % PEEP:  [5 cmH20] 5 cmH20 Pressure Support:  [5 cmH20] 5 cmH20  INTAKE / OUTPUT: I/O last 3 completed shifts: In: 1637.7 [I.V.:640.4; NG/GT:594; IV Piggyback:403.3] Out: 4875 [Urine:4875]  PHYSICAL EXAMINATION: General: Awake, responsive Neuro: No gross focal deficits HEENT:Moist mucous membranes  Cardiovascular: S1-S2, regular rate and rhythm, no murmurs rubs gallops  Lungs:Clear anteriorly  Abdomen: Soft, positive  bowel sounds  Skin: Intact   LABS:  CBC  Recent Labs Lab 11/19/15 0455 11/20/15 0410 11/21/15 0438  WBC 9.0 9.6 17.7*  HGB 10.7* 10.9* 12.4*  HCT 32.8* 33.9* 37.2*  PLT 178 205 231   Coag's No results for input(s): APTT, INR in the last 168 hours. BMET  Recent Labs Lab 11/20/15 0410 11/21/15 0437 11/21/15 0438  NA 144  144 148* 148*  K 3.5  3.5 3.3* 3.4*  CL 106  106 111 110  CO2 32  31 29 29   BUN 35*  35* 30* 31*  CREATININE 1.05  1.05 0.99 0.96  GLUCOSE 160*  158* 149* 148*   Electrolytes  Recent Labs Lab 11/19/15 0455 11/20/15 0410 11/21/15 0437 11/21/15 0438  CALCIUM 8.0* 8.3*  8.3* 8.6* 8.7*  MG 2.2 2.1  --  2.2  PHOS 3.0 3.0 3.0 3.0   Sepsis Markers  Recent Labs Lab 11/20/15 1059 11/21/15 0438  PROCALCITON 0.45 0.32   ABG  Recent Labs Lab 11/18/15 1150  PHART 7.470*  PCO2ART 36.4  PO2ART 77.3*   Liver Enzymes  Recent Labs Lab 11/15/15 0420  11/19/15 0455 11/20/15 0410 11/21/15 0437  AST 133*  --   --   --   --   ALT 78*  --   --   --   --   ALKPHOS 56  --   --   --   --   BILITOT 0.8  --   --   --   --   ALBUMIN 2.6*  < >  2.0* 2.1* 2.7*  < > = values in this interval not displayed. Cardiac Enzymes No results for input(s): TROPONINI, PROBNP in the last 168 hours. Glucose  Recent Labs Lab 11/20/15 1138 11/20/15 1714 11/20/15 2045 11/20/15 2342 11/21/15 0427 11/21/15 0806  GLUCAP 140* 88 166* 123* 135* 146*    Imaging No results found.   STUDIES:  Dec 08, 2023  LHC - stent lad Dec 08, 2023  CT head - no acute abnormality 11/30  eeg - no seizure activity. suppression 12/03  Port CXR 12/3 - Bilat lower lung opacity.  CULTURES:  ANTIBIOTICS: Levaquin 11/30 >> 12/4 Vanco 12/9 >> Aztreonam 12/9 >>  SIGNIFICANT EVENTS: 12/08/23 cardiac arrest > cath lab, out to ICU, therapeutic hypothermia.  12/02 completed rewarming 12/9 Extubated  LINES/TUBES: ETT 2023-12-08 >> 12/8 CVL Dec 08, 2023 >> 12/9 Left radial 12/08/23 >>  12/3   ASSESSMENT / PLAN:  PULMONARY A: Acute Hypoxic Respiratory Failure Severe CAP vs Aspiration Pneumonia  HCAP  P:   Wean down on nasal cannula O2 as tolerated Chest x-ray today Start Vanco and aztreonam for appearance sputum, elevated WBC count Procalcitonin protocol  CARDIOVASCULAR A:  Cardiac arrest - VT/VF STEMI s/p PCI 12-08-2023 HTN  P:  Care per Cardiology Wyn Quaker, Lopressor & Lipitor ICU monitoring of hemodynamics  Follow triglycerides while on propofol  Off Lasix since yesterday. He continues to auto diurese.  RENAL A:   Hypokalemia  Hypophosphatemia - Resolved. Hypomagnesemia - Resolved.  P:   Monitor UOP / BMP Renal function is stable  Replete potassium  GASTROINTESTINAL A:   Transaminitis - Shock liver. Resolving. At Risk Protein Calorie Malnutrition  P:   Swallow eval Pepcid IV q12hr  HEMATOLOGIC A:   Leukocytosis - Improving. Suspect stress response. Anemia  P:  Follow CBC Antiplatelet / anticoagulation per Cardiology Heparin Jacobus q8hr for DVT prophylaxis   INFECTIOUS A:   Possible CAP HCAP Higher risk for infection from cooling  P:   Finished a course of levofloxacin  Starting broad-spectrum antibiotics for HCAP Monitor fever curve / WBC  ENDOCRINE A:   Hyperglycemia - No H/O DM.  P:   Accu-Checks q4hr SSI coverage  AUTOIMMUNE:  A:  Rheumatoid Arthritis - on DMARD & Methotrexate at baseline   P:  Hold home medications  NEUROLOGIC A:   ICU delirium P:   Continue Seroquel and trazodone. Haldol PRN. QTC okay Physical therapy evaluation ordered  FAMILY - Updates: Wife and daughter updated 12/9 - Inter-disciplinary family meet or Palliative Care meeting due by:  12/7  Marshell Garfinkel MD [place] [facility] Pager [phone] If no answer or after 3pm call: [phone] 11/21/2015, 10:52 AM    TODAY'S SUMMARY:  69 year old male post arrest. S/P rewarming. Difficulty with agitation, sedation  are barriers to weaning.   Critical care time-35 minutes  Marshell Garfinkel MD [place] [facility] Pager [phone] If no answer or after 3pm call: [phone] 11/21/2015, 10:52 AM

## 2015-11-21 NOTE — Progress Notes (Signed)
[place] for vancomycin and aztreonam Indication: aspiration pneumonia  Allergies  Allergen Reactions  . Contrast Media [Iodinated Diagnostic Agents] Hives    03/28/14 pt received 1 hour emergent prep and pt did good and had no complaints after scan.  . Ibuprofen     REACTION: unspecified  . Penicillins     REACTION: unspecified  . Sulfamethoxazole     REACTION: unspecified    Patient Measurements: Height: 5\' 7"  (170.2 cm) Weight: 181 lb 3.5 oz (82.2 kg) IBW/kg (Calculated) : 66.1  Vital Signs: Temp: 98.7 F (37.1 C) (12/09 0807) Temp Source: Oral (12/09 0807) BP: 129/71 mmHg (12/09 0805) Pulse Rate: 112 (12/09 0805) Intake/Output from previous day: 12/08 0701 - 12/09 0700 In: 100 [IV Piggyback:100] Out: 3625 [Urine:3625] Intake/Output from this shift: Total I/O In: 20 [I.V.:20] Out: 175 [Urine:175]  Labs:  Recent Labs  11/19/15 0455 11/20/15 0410 11/21/15 0437 11/21/15 0438  WBC 9.0 9.6  --  17.7*  HGB 10.7* 10.9*  --  12.4*  PLT 178 205  --  231  CREATININE 0.88 1.05  1.05 0.99 0.96   Estimated Creatinine Clearance: 75.5 mL/min (by C-G formula based on Cr of 0.96). No results for input(s): VANCOTROUGH, VANCOPEAK, VANCORANDOM, GENTTROUGH, GENTPEAK, GENTRANDOM, TOBRATROUGH, TOBRAPEAK, TOBRARND, AMIKACINPEAK, AMIKACINTROU, AMIKACIN in the last 72 hours.   Microbiology: Recent Results (from the past 720 hour(s))  MRSA PCR Screening     Status: None   Collection Time: 11/12/15 12:51 AM  Result Value Ref Range Status   MRSA by PCR NEGATIVE NEGATIVE Final    Comment:        The GeneXpert MRSA Assay (FDA approved for NASAL specimens only), is one component of a comprehensive MRSA colonization surveillance program. It is not intended to diagnose MRSA infection nor to guide or monitor treatment for MRSA infections.     Medical History: Past Medical History  Diagnosis Date  . Rheumatoid arthritis(714.0) 1998  .  Nephrolithiasis 1968  . Insomnia   . Allergic rhinitis   . Impaired glucose tolerance   . Hypertension   . ST elevation (STEMI) myocardial infarction involving left anterior descending coronary artery ([place]) 11/11/2015    100% LAD, Cardiogenic Shock. -- EF ~20-25%  . Cardiac arrest with ventricular fibrillation ([place]) 11/11/2015  . CAD S/P PCI OF pLAD with 3.5 mm x38 mm Promus DES (3.75 mm) 11/12/2015  . Cardiomyopathy, ischemic - EF 20-25% by LV Gram 11/12/2015    There is severe left ventricular systolic dysfunction. The left ventricular ejection fraction is 25-35% by visual estimate. There are wall motion abnormalities in the left ventricle. There are segmental wall motion abnormalities in the left ventricle. The anterolateral and periapical LV walls are akinetic with an estimated LVEF of 25%   Assessment: 69 year old male with PMH of RA and HTN. He presented to [facility] ED 11/29 after a witnessed cardiac arrest at home. He has now been extubated.  Tmax of 99.6, wbc now up to 17.7 overnight, renal function normal, pct 0.32. Concern today for severe CAP vs aspiration pneumonia will start empiric vancomycin and aztreonam. He completed 5d course of levaquin on 12/4.   Goal of Therapy:  Vancomycin trough level 15-20 mcg/ml  Plan:  Measure antibiotic drug levels at steady state Follow up culture results Aztreonam 1g IV q8 hours  Vancomycin 1500mg  IV now then 1g IV q12 hours  Erin Hearing PharmD., BCPS Clinical Pharmacist Pager [phone] 11/21/2015 11:28 AM

## 2015-11-21 NOTE — Evaluation (Addendum)
Physical Therapy Evaluation Patient Details Name: [name] MRN: [id number] DOB: 1946/10/10 Today's Date: 11/21/2015   History of Present Illness  69 y.o. male w/ PMHx significant for rheumatoid arthritis and HTN who presented to [facility] on 11/11/2015 after an out-of-hospital cardiac arrest. The patient reportedly had chest and arm pain intermittently for 2 weeks after sustaining a mechanical fall. Today he had worsening of his chest pain and he ultimately arrested at home, witnessed by his wife. First responder delivered 2 shocks via an AED and the patient had ROSC with this. An EKG in the field was suggestive of acute anterior STEMI  Patient intubated 11/29-12/8.  Clinical Impression  Patient presents with decreased mobility due to deficits listed in PT problem list below and will benefit from skilled PT in the acute setting to allow d/c home following inpatient rehab stay.  Feel pt with motor planning issues as well as profound weakness and will benefit from skilled interdisciplinary therapies offered in a comprehensive inpatient rehab setting.  Will benefit from OCCUPATIONAL THERAPY consult as well.    Follow Up Recommendations CIR;Supervision/Assistance - 24 hour    Equipment Recommendations  Other (comment) (TBA)    Recommendations for Other Services OT consult;Rehab consult     Precautions / Restrictions Precautions Precautions: Fall      Mobility  Bed Mobility Overal bed mobility: Needs Assistance Bed Mobility: Rolling;Sidelying to Sit;Sit to Supine Rolling: Mod assist;Max assist Sidelying to sit: Max assist   Sit to supine: Total assist;+2 for physical assistance   General bed mobility comments: rolled for hygiene due to reported stool incontinence, increased time and cues and mod A given, then rolled back to back and to other side in prep for side to sit with improved effort and mod A, up to sit with A for feet and trunk; to supine with assist of  nursing due to soiled with stool and sitting near EOB  Transfers Overall transfer level: Needs assistance   Transfers: Squat Pivot Transfers     Squat pivot transfers: Max assist;Total assist;+2 physical assistance     General transfer comment: bed to chair +1 A max A, pt able to bear weight through legs, but with poor upper trunk control; to bed with nursing A and increased time due to increased height of bed;.  Ambulation/Gait             General Gait Details:  pt unable to ambulate at this time  Stairs            Wheelchair Mobility    Modified Rankin (Stroke Patients Only)       Balance Overall balance assessment: Needs assistance Sitting-balance support: Bilateral upper extremity supported Sitting balance-Leahy Scale: Poor Sitting balance - Comments: worked on balance at EOB x about 10 minutes cues, placement, trunk rotation and facilitation for head, neck, shoulder and core activation.  Has strength, but cannot maintain more than a few seconds with head righted, able to rotate trunk and demonstrated improved balance momentarily afterwards.     Standing balance-Leahy Scale: Zero                               Pertinent Vitals/Pain Pain Assessment: Faces Faces Pain Scale: No hurt    Home Living Family/patient expects to be discharged to:: Private residence Living Arrangements: Spouse/significant other Available Help at Discharge: Family;Available 24 hours/day Type of Home: House Home Access: Stairs to enter Entrance Stairs-Rails: Right  Entrance Stairs-Number of Steps: 4 Home Layout: Two level;Able to live on main level with bedroom/bathroom Home Equipment: Walker - 2 wheels      Prior Function Level of Independence: Independent         Comments: did part time work for [employer], wife can work from home     Journalist, newspaper   Dominant Hand: Right    Extremity/Trunk Assessment   Upper Extremity Assessment: RUE deficits/detail;LUE  deficits/detail RUE Deficits / Details: AROM grossly WFL, strength shoulder flexion 3+/5, elbow flexion 4-/5 grip 4-/5     LUE Deficits / Details: AROM grossly WFL, strength shoulder flexion 3-/5, elbow flexion 4-/5 grip 4-/5   Lower Extremity Assessment: RLE deficits/detail;LLE deficits/detail RLE Deficits / Details: AAROM WFL, strength hip flexion 3-/5, knee extension 3+/5, ankle DF 4/5 LLE Deficits / Details: AAROM WFL, strength hip flexion 3-/5, knee extension 3+/5, ankle DF 4/5  Cervical / Trunk Assessment: Other exceptions  Communication   Communication: Expressive difficulties (whispers/low volume)  Cognition Arousal/Alertness: Awake/alert Behavior During Therapy: WFL for tasks assessed/performed Overall Cognitive Status: No family/caregiver present to determine baseline cognitive functioning Area of Impairment: Problem solving             Problem Solving: Decreased initiation;Difficulty sequencing;Requires verbal cues;Requires tactile cues;Slow processing General Comments: very slow to respond to mobility commands as if planning issue, but pt also globally weak; perseverated on getting up to chair despite his obvious weakness, stool incontinence and needing extensive assist even for sitting balance    General Comments General comments (skin integrity, edema, etc.): extensive bruising on L lateral and posterior aspect of trunk and hip     Exercises        Assessment/Plan    PT Assessment Patient needs continued PT services  PT Diagnosis Generalized weakness   PT Problem List Decreased strength;Decreased activity tolerance;Decreased cognition;Decreased safety awareness;Decreased knowledge of use of DME;Decreased balance;Decreased mobility;Decreased coordination;Cardiopulmonary status limiting activity  PT Treatment Interventions DME instruction;Balance training;Gait training;Neuromuscular re-education;Functional mobility training;Cognitive remediation;Patient/family  education;Therapeutic activities;Therapeutic exercise   PT Goals (Current goals can be found in the Care Plan section) Acute Rehab PT Goals Patient Stated Goal: To get stronger PT Goal Formulation: With patient Time For Goal Achievement: 12/05/15 Potential to Achieve Goals: Good    Frequency Min 3X/week   Barriers to discharge        Co-evaluation               End of Session Equipment Utilized During Treatment: Gait belt Activity Tolerance: Patient limited by fatigue;Other (comment) (limtied due to fecal incontinence) Patient left: in bed           Time: [id number] PT Time Calculation (min) (ACUTE ONLY): 47 min   Charges:   PT Evaluation $Initial PT Evaluation Tier I: 1 Procedure PT Treatments $Therapeutic Activity: 23-37 mins   PT G Codes:        Lexis Potenza,CYNDI 11-23-2015, 5:39 PM  Magda Kiel, [place] Nov 23, 2015

## 2015-11-21 NOTE — Progress Notes (Signed)
Patient Name:  [name], DOB: 1946/09/17, MRN: [id number] Primary Doctor: [name], MD Primary Cardiologist:   Date: 11/21/2015   SUBJECTIVE:  The patient has been extubated and he is doing well. His family is in the room. The patient is able to speak very softly. He's not having any significant chest pain or shortness of breath.   Past Medical History  Diagnosis Date  . Rheumatoid arthritis(714.0) 1998  . Nephrolithiasis 1968  . Insomnia   . Allergic rhinitis   . Impaired glucose tolerance   . Hypertension   . ST elevation (STEMI) myocardial infarction involving left anterior descending coronary artery ([place]) 11/11/2015    100% LAD, Cardiogenic Shock. -- EF ~20-25%  . Cardiac arrest with ventricular fibrillation ([employer]) 11/11/2015  . CAD S/P PCI OF pLAD with 3.5 mm x38 mm Promus DES (3.75 mm) 11/12/2015  . Cardiomyopathy, ischemic - EF 20-25% by LV Gram 11/12/2015    There is severe left ventricular systolic dysfunction. The left ventricular ejection fraction is 25-35% by visual estimate. There are wall motion abnormalities in the left ventricle. There are segmental wall motion abnormalities in the left ventricle. The anterolateral and periapical LV walls are akinetic with an estimated LVEF of 25%    Filed Vitals:   11/21/15 0700 11/21/15 0800 11/21/15 0805 11/21/15 0807  BP: 126/67  129/71   Pulse: 105 101 112   Temp:    98.7 F (37.1 C)  TempSrc:    Oral  Resp: 26 19 25    Height:      Weight:      SpO2: 93% 90% 97%     Intake/Output Summary (Last 24 hours) at 11/21/15 1023 Last data filed at 11/21/15 0900  Gross per 24 hour  Intake     70 ml  Output   2775 ml  Net  -2705 ml   Filed Weights   11/19/15 0441 11/20/15 0500 11/21/15 0443  Weight: 196 lb 13.9 oz (89.3 kg) 190 lb 0.6 oz (86.2 kg) 181 lb 3.5 oz (82.2 kg)     LABS: Basic Metabolic Panel:  Recent Labs  11/20/15 0410 11/21/15 0437 11/21/15 0438  NA 144  144 148* 148*    K 3.5  3.5 3.3* 3.4*  CL 106  106 111 110  CO2 32  31 29 29   GLUCOSE 160*  158* 149* 148*  BUN 35*  35* 30* 31*  CREATININE 1.05  1.05 0.99 0.96  CALCIUM 8.3*  8.3* 8.6* 8.7*  MG 2.1  --  2.2  PHOS 3.0 3.0 3.0   Liver Function Tests:  Recent Labs  11/20/15 0410 11/21/15 0437  ALBUMIN 2.1* 2.7*   No results for input(s): LIPASE, AMYLASE in the last 72 hours. CBC:  Recent Labs  11/20/15 0410 11/21/15 0438  WBC 9.6 17.7*  NEUTROABS 7.1 15.7*  HGB 10.9* 12.4*  HCT 33.9* 37.2*  MCV 95.8 94.9  PLT 205 231   Cardiac Enzymes: No results for input(s): CKTOTAL, CKMB, CKMBINDEX, TROPONINI in the last 72 hours. BNP: Invalid input(s): POCBNP D-Dimer: No results for input(s): DDIMER in the last 72 hours. Thyroid Function Tests: No results for input(s): TSH, T4TOTAL, T3FREE, THYROIDAB in the last 72 hours.  Invalid input(s): FREET3  RADIOLOGY: Dg Chest 1 View  11/19/2015  CLINICAL DATA:  69 year old male status post V fib arrest. Respiratory failure. Initial encounter. EXAM: CHEST 1 VIEW COMPARISON:  11/18/2015 and earlier. FINDINGS: Portable AP semi upright view at 1007 hours. Endotracheal tube  tip remains in good position between the clavicles and carina. Enteric tube courses to the abdomen, tip not included. Stable left IJ central line. Improved right lung base ventilation, regressed opacity. Continued dense left lung base/retrocardiac opacity with some air bronchograms. No pneumothorax or pulmonary edema. Probable small pleural effusions. Stable cardiac size and mediastinal contours. IMPRESSION: 1.  Stable lines and tubes. 2. Interval improved right lung base ventilation. Residual left lower lobe collapse/consolidation. Probable small effusions. Electronically Signed   By: Genevie Ann M.D.   On: 11/19/2015 10:15   Ct Head Wo Contrast  11/11/2015  CLINICAL DATA:  69 year old male with cardiac arrest. EXAM: CT HEAD WITHOUT CONTRAST TECHNIQUE: Contiguous axial images were  obtained from the base of the skull through the vertex without intravenous contrast. COMPARISON:  Head CT dated 03/29/2015 FINDINGS: There is slight prominence of the ventricles and sulci compatible with age-related volume loss. Mild periventricular and deep white matter hypodensities represent chronic microvascular ischemic changes. There is no intracranial hemorrhage. No mass effect or midline shift identified. The visualized paranasal sinuses and mastoid air cells are well aerated. The calvarium is intact. IMPRESSION: No acute intracranial pathology. Mild age-related atrophy and chronic microvascular ischemic disease. If symptoms persist and there are no contraindications, MRI may provide better evaluation if clinically indicated Electronically Signed   By: Anner Crete M.D.   On: 11/11/2015 23:44   Dg Chest Port 1 View  11/20/2015  CLINICAL DATA:  Respiratory failure. EXAM: PORTABLE CHEST 1 VIEW COMPARISON:  11/19/2015.  11/17/2015.  CT 03/29/2015. FINDINGS: Endotracheal tube, NG tube, left IJ line stable position. Worsening bibasilar atelectasis and or infiltrates/edema. Small left pleural effusion. No pneumothorax. Stable cardiomegaly. Multiple left rib fractures again noted. IMPRESSION: 1. Lines and tubes in stable position. 2. Progressive bibasilar atelectasis and/or infiltrates/edema. Small left pleural effusion . 3. Stable cardiomegaly. Electronically Signed   By: Marcello Moores  Register   On: 11/20/2015 07:08   Dg Chest Port 1 View  11/18/2015  CLINICAL DATA:  69 year old male with acute respiratory failure. Status post cardiac arrest. Initial encounter. EXAM: PORTABLE CHEST 1 VIEW COMPARISON:  11/17/2015 and earlier. FINDINGS: Portable AP semi upright view at 0514 hours. Stable endotracheal tube. Stable visualized enteric tube coursing to the abdomen. Stable left IJ central line. Increased veiling opacity at the right lung base. Continued dense retrocardiac opacity. Continued left and new right  perihilar interstitial opacity. No pneumothorax. Small left side effusion suspected. IMPRESSION: 1.  Stable lines and tubes. 2. Interval increased bilateral perihilar opacity and right pleural effusion. Favor pulmonary interstitial edema over infection. 3. Continued dense lower lobe collapse/consolidation. Suspect stable small left effusion. Electronically Signed   By: Genevie Ann M.D.   On: 11/18/2015 07:12   Dg Chest Port 1 View  11/17/2015  CLINICAL DATA:  Acute respiratory failure, CHF, and coronary artery disease EXAM: PORTABLE CHEST 1 VIEW COMPARISON:  Portable chest x-ray of November 15, 2015 FINDINGS: The lungs are adequately inflated. There has been further development of alveolar opacity in the left mid and lower lung. The retrocardiac region is opacified and the left hemidiaphragm is largely obscured. There is persistent alveolar opacity in the right infrahilar region. The heart is mildly enlarged. The pulmonary vascularity is mildly engorged. The endotracheal tube tip projects 4.9 cm above the carina. The esophagogastric tube tip projects below the inferior margin of the image. The left internal jugular venous catheter tip projects over the midportion of the SVC. IMPRESSION: Slight interval worsening of alveolar opacities on the left  compatible with pneumonia or less likely pulmonary edema. Stable subsegmental atelectasis or early infiltrate in the right infrahilar region. Low-grade CHF. Electronically Signed   By: David  Martinique M.D.   On: 11/17/2015 07:11   Dg Chest Port 1 View  11/15/2015  CLINICAL DATA:  Pulmonary edema.  Recent cardiac catheterization. EXAM: PORTABLE CHEST 1 VIEW COMPARISON:  11/14/2015 and 11/13/2015. FINDINGS: 0524 hours. Endotracheal tube is unchanged, approximately 3 cm above the carina. Left IJ central venous catheter and nasogastric tube are unchanged. The heart size and mediastinal contours are stable. Bibasilar airspace opacities have not significantly changed. There is no  pneumothorax or significant pleural effusion. IMPRESSION: Stable chest with bibasilar airspace opacities. Stable support system. Electronically Signed   By: Richardean Sale M.D.   On: 11/15/2015 09:14   Dg Chest Port 1 View  11/14/2015  CLINICAL DATA:  Hypoxia EXAM: PORTABLE CHEST 1 VIEW COMPARISON:  November 13, 2015 FINDINGS: Endotracheal tube tip is 3.1 cm above the carina. Nasogastric tube tip and side port below the diaphragm. Central catheter tip is in the superior vena cava. No pneumothorax. There is airspace consolidation in the left base. There is mild bibasilar interstitial edema. There is a small left pleural effusion. Heart is slightly enlarged with pulmonary vascularity within normal limits. No adenopathy. IMPRESSION: Tube and catheter positions as described without pneumothorax. Small left effusion with bibasilar edema. Suspect a degree of congestive heart failure. Consolidation in the left base may represent alveolar edema but does suggest potential underlying pneumonia. No new opacity appreciable compared to 1 day prior. Electronically Signed   By: Lowella Grip III M.D.   On: 11/14/2015 07:21   Dg Chest Port 1 View  11/13/2015  CLINICAL DATA:  Cardiac arrest, acute respiratory failure, CHF. EXAM: PORTABLE CHEST 1 VIEW COMPARISON:  Portable chest x-ray of November 12, 2015 FINDINGS: The lungs are adequately inflated. The retrocardiac region remains dense on the left. The interstitial markings at the right lung base are slightly more conspicuous today. There is no pneumothorax nor large pleural effusion. The heart is top-normal in size. The pulmonary vascularity is not clearly engorged. The endotracheal tube tip lies approximately 2.5 cm above the carina. The esophagogastric tube tip projects below the inferior margin of the image. The left internal jugular venous catheter tip projects over the midportion of the SVC. External pacemaker defibrillator pads are present. IMPRESSION: Increased  interstitial density in both lung bases consistent with infiltrate or pulmonary edema. The upper lobe pulmonary vascularity is only minimally prominent. Stable support tubes. Electronically Signed   By: David  Martinique M.D.   On: 11/13/2015 07:14   Dg Chest Port 1 View  11/12/2015  CLINICAL DATA:  Central line placement EXAM: PORTABLE CHEST 1 VIEW COMPARISON:  11/11/2015 FINDINGS: The endotracheal tube is 4.7 cm above the carina. Nasogastric tube extends into the stomach. There is a new left jugular central line with tip in the low SVC. There is no pneumothorax. Ground-glass opacities are present in the central and basilar regions bilaterally, worsened. This could represent alveolar edema or infectious infiltrate. No large effusions. The left hemi thorax is superimposed by a percutaneous pacing patch. IMPRESSION: 1. New left jugular central line with tip in the low SVC. No pneumothorax. 2. Satisfactorily positioned endotracheal tube and nasogastric tube. 3. New/worsening central alveolar edema or infiltrate. Electronically Signed   By: Andreas Newport M.D.   On: 11/12/2015 06:00   Dg Chest Portable 1 View  11/11/2015  CLINICAL DATA:  Myocardial infarction.  Post resuscitation. EXAM: PORTABLE CHEST 1 VIEW COMPARISON:  03/29/2015 FINDINGS: Endotracheal tube tip is 2.6 cm above the carina. The nasogastric tube extends into the stomach. The lungs are clear except for mild atelectatic appearing linear basilar opacities on the left. No confluent airspace consolidation. No large effusion. No pneumothorax. Mild central vascular prominence, which can be physiologic in the supine position. IMPRESSION: Support equipment appears satisfactorily positioned. Electronically Signed   By: Andreas Newport M.D.   On: 11/11/2015 22:12   Dg Abd Portable 1v  11/18/2015  CLINICAL DATA:  Orogastric tube placement EXAM: PORTABLE ABDOMEN - 1 VIEW COMPARISON:  CT abdomen and pelvis March 29, 2015 FINDINGS: Orogastric tube tip and  side port are in the stomach. There is moderate stool throughout the colon. There is no bowel dilatation or air-fluid level suggesting obstruction. No free air is seen on this supine examination. IMPRESSION: Orogastric tube tip and side port in stomach. Bowel gas pattern unremarkable. Moderate stool throughout colon. Electronically Signed   By: Lowella Grip III M.D.   On: 11/18/2015 08:15    PHYSICAL EXAM  the patient is oriented to person time and place. Affect is normal. His wife and daughter are in the room. He speaks softly. Lungs reveal scattered rhonchi. Cardiac exam reveals an S1 and S2. The abdomen is soft. There is no peripheral edema.   TELEMETRY: I have personally reviewed telemetry today November 21, 2015. There is sinus rhythm. There is no further bradycardia. The patient has mild resting sinus tachycardia.   ASSESSMENT AND PLAN:    Cardiac arrest with ventricular fibrillation [facility])     The patient is slowly improving.    Rheumatoid arthritis ([name])    ST elevation (STEMI) myocardial infarction involving left anterior descending coronary artery [facility])     He is on all appropriate medications.    Acute respiratory failure with hypoxia [facility])     He is now been extubated and is doing well.    CAD S/P PCI OF pLAD with 3.5 mm x38 mm Promus DES (3.75 mm)    Cardiomyopathy, ischemic - EF 20-25% by LV Gram     His volume status today appears stable. He diuresed yesterday with no significant diuretic. Chest x-ray is to be done this morning. I have not ordered any diuretics for today at this time. His blood pressure is now stable for the initiation of an ACE inhibitor. In addition his heart rate is stable for the initiation of carvedilol.    Acute delirium    This is greatly improved.    Hypokalemia    I have ordered a dose of potassium.    Cough with sputum production    The patient will be having a chest x-ray this morning. The critical care medicine team will assess the  x-ray and decide about the approach to the patient's cough with sputum. They can also decide at that time if there is any change in thought concerning Lasix therapy today.  Dola Argyle 11/21/2015 10:23 AM

## 2015-11-21 NOTE — Care Management Important Message (Signed)
Important Message  Patient Details  Name: [name] MRN: [id number] Date of Birth: March 14, 1946   Medicare Important Message Given:  Yes    Viveka Wilmeth P Orie [place]end[place] 11/21/2015, 1:32 PM

## 2015-11-21 NOTE — Evaluation (Signed)
Clinical/Bedside Swallow Evaluation Patient Details  Name: [name] MRN: [id number] Date of Birth: Jan 31, 1946  Today's Date: 11/21/2015 Time: SLP Start Time (ACUTE ONLY): [id number] SLP Stop Time (ACUTE ONLY): 1436 SLP Time Calculation (min) (ACUTE ONLY): 15 min  Past Medical History:  Past Medical History  Diagnosis Date  . Rheumatoid arthritis(714.0) 1998  . Nephrolithiasis 1968  . Insomnia   . Allergic rhinitis   . Impaired glucose tolerance   . Hypertension   . ST elevation (STEMI) myocardial infarction involving left anterior descending coronary artery ([place]) 11/11/2015    100% LAD, Cardiogenic Shock. -- EF ~20-25%  . Cardiac arrest with ventricular fibrillation ([place]) 11/11/2015  . CAD S/P PCI OF pLAD with 3.5 mm x38 mm Promus DES (3.75 mm) 11/12/2015  . Cardiomyopathy, ischemic - EF 20-25% by LV Gram 11/12/2015    There is severe left ventricular systolic dysfunction. The left ventricular ejection fraction is 25-35% by visual estimate. There are wall motion abnormalities in the left ventricle. There are segmental wall motion abnormalities in the left ventricle. The anterolateral and periapical LV walls are akinetic with an estimated LVEF of 25%    Past Surgical History:  Past Surgical History  Procedure Laterality Date  . Status post multiple lithotripy and urological surgery for reccurent calcium oxalate stones    . Cardiac catheterization N/A 11/11/2015    Procedure: Left Heart Cath and Cors/Grafts Angiography;  Surgeon: Sherren Mocha, MD;  Location: [place] [facility];  Service: Cardiovascular;  Laterality: N/A;  . Cardiac catheterization N/A 11/11/2015    Procedure: Coronary Stent Intervention;  Surgeon: Sherren Mocha, MD;  Location: [place] [facility];  Service: Cardiovascular;  Laterality: N/A;  prox lad 3.5x38 promus   HPI:  69 year old male with PMH of RA and HTN. He presented to [facility] ED 11/29 after a witnessed cardiac arrest at home. Family initiated CPR.  EMS arrived and AED was placed. Two shocks were delivered until ROSC was achieved. Total down time estimated at less than 15 minutes. Pt was intubated 11/29-12/8.   Assessment / Plan / Recommendation Clinical Impression  Pt is borderline aphonic and with generalized weakness, resulting in limited ability to cough and likely poor laryngeal closure for airway protection. Wet vocal quality, coughing, and expectoration of thin liquids and purees administered are concerning for high aspiration risk. Prognosis for return of fucntion is good with increased time post-extubation and restore of functional reserve, but for now would keep NPO. SLP will continue to follow for PO readiness versus need for FEES.    Aspiration Risk  Severe aspiration risk    Diet Recommendation  NPO   Medication Administration: Via alternative means    Other  Recommendations Oral Care Recommendations: Oral care QID   Follow up Recommendations   (tba)    Frequency and Duration min 2x/week  2 weeks       Prognosis Prognosis for Safe Diet Advancement: Good      Swallow Study   General Date of Onset: 11/11/15 HPI: 69 year old male with PMH of RA and HTN. He presented to [facility] ED 11/29 after a witnessed cardiac arrest at home. Family initiated CPR. EMS arrived and AED was placed. Two shocks were delivered until ROSC was achieved. Total down time estimated at less than 15 minutes. Pt was intubated 11/29-12/8. Type of Study: Bedside Swallow Evaluation Previous Swallow Assessment: none in chart Diet Prior to this Study: NPO Temperature Spikes Noted: Yes (99.6) Respiratory Status: Nasal cannula History  of Recent Intubation: Yes Length of Intubations (days): 10 days Date extubated: 11/20/15 Behavior/Cognition: Alert;Cooperative;Pleasant mood Oral Cavity Assessment: Within Functional Limits Oral Care Completed by SLP: Yes Oral Cavity - Dentition: Adequate natural dentition Self-Feeding Abilities: Needs  assist Patient Positioning: Upright in bed Baseline Vocal Quality: Low vocal intensity;Other (comment) (borderline aphonic) Volitional Cough: Weak Volitional Swallow: Able to elicit    Oral/Motor/Sensory Function Overall Oral Motor/Sensory Function: Generalized oral weakness   Ice Chips Ice chips: Impaired Presentation: Spoon Pharyngeal Phase Impairments: Suspected delayed Swallow;Cough - Immediate;Multiple swallows;Other (comments) (suspect poor laryngeal closure)   Thin Liquid Thin Liquid: Not tested    Nectar Thick Nectar Thick Liquid: Not tested   Honey Thick Honey Thick Liquid: Not tested   Puree Puree: Impaired Presentation: Spoon Pharyngeal Phase Impairments: Suspected delayed Swallow;Wet Vocal Quality;Cough - Delayed;Other (comments) (suspect poor laryngeal closure)   Solid Solid: Not tested      [name], M.A. CCC-SLP [phone]  [name] 11/21/2015,2:58 PM

## 2015-11-21 NOTE — Progress Notes (Signed)
[facility] ADULT ICU REPLACEMENT PROTOCOL FOR AM LAB REPLACEMENT ONLY  The patient does  apply for the [facility] Adult ICU Electrolyte Replacment Protocol based on the criteria listed below:   1. Is GFR >/= 40 ml/min? Yes.    Patient's GFR today is >60 2. Is urine output >/= 0.5 ml/kg/hr for the last 6 hours? Yes.   Patient's UOP is 1.3 ml/kg/hr 3. Is BUN < 60 mg/dL? Yes.    Patient's BUN today is 31 4. Abnormal electrolyte(s): K3.4 5. Ordered repletion with: per protocol 6. If a panic level lab has been reported, has the CCM MD in charge been notified? Yes.   Physician:  Janalyn Harder 11/21/2015 5:21 AM

## 2015-11-22 ENCOUNTER — Inpatient Hospital Stay (HOSPITAL_COMMUNITY): Payer: BLUE CROSS/BLUE SHIELD

## 2015-11-22 DIAGNOSIS — J189 Pneumonia, unspecified organism: Secondary | ICD-10-CM

## 2015-11-22 DIAGNOSIS — I5023 Acute on chronic systolic (congestive) heart failure: Secondary | ICD-10-CM

## 2015-11-22 LAB — CBC WITH DIFFERENTIAL/PLATELET
BASOS ABS: 0 10*3/uL (ref 0.0–0.1)
Basophils Relative: 0 %
Eosinophils Absolute: 0 10*3/uL (ref 0.0–0.7)
Eosinophils Relative: 0 %
HEMATOCRIT: 39.5 % (ref 39.0–52.0)
HEMOGLOBIN: 13 g/dL (ref 13.0–17.0)
LYMPHS ABS: 0.8 10*3/uL (ref 0.7–4.0)
LYMPHS PCT: 4 %
MCH: 31.6 pg (ref 26.0–34.0)
MCHC: 32.9 g/dL (ref 30.0–36.0)
MCV: 95.9 fL (ref 78.0–100.0)
Monocytes Absolute: 1 10*3/uL (ref 0.1–1.0)
Monocytes Relative: 5 %
NEUTROS ABS: 19.3 10*3/uL — AB (ref 1.7–7.7)
NEUTROS PCT: 91 %
PLATELETS: 264 10*3/uL (ref 150–400)
RBC: 4.12 MIL/uL — AB (ref 4.22–5.81)
RDW: 15.1 % (ref 11.5–15.5)
WBC: 21.1 10*3/uL — AB (ref 4.0–10.5)

## 2015-11-22 LAB — RENAL FUNCTION PANEL
ANION GAP: 6 (ref 5–15)
Albumin: 2.5 g/dL — ABNORMAL LOW (ref 3.5–5.0)
BUN: 39 mg/dL — ABNORMAL HIGH (ref 6–20)
CALCIUM: 8.5 mg/dL — AB (ref 8.9–10.3)
CO2: 25 mmol/L (ref 22–32)
Chloride: 117 mmol/L — ABNORMAL HIGH (ref 101–111)
Creatinine, Ser: 0.94 mg/dL (ref 0.61–1.24)
GFR calc non Af Amer: >60 mL/min (ref >60)
GLUCOSE: 196 mg/dL — AB (ref 65–99)
POTASSIUM: 3.6 mmol/L (ref 3.5–5.1)
Phosphorus: 2.2 mg/dL — ABNORMAL LOW (ref 2.5–4.6)
SODIUM: 148 mmol/L — AB (ref 135–145)

## 2015-11-22 LAB — GLUCOSE, CAPILLARY
GLUCOSE-CAPILLARY: 162 mg/dL — AB (ref 65–99)
GLUCOSE-CAPILLARY: 174 mg/dL — AB (ref 65–99)
GLUCOSE-CAPILLARY: 215 mg/dL — AB (ref 65–99)
Glucose-Capillary: 193 mg/dL — ABNORMAL HIGH (ref 65–99)
Glucose-Capillary: 233 mg/dL — ABNORMAL HIGH (ref 65–99)
Glucose-Capillary: 191 mg/dL — ABNORMAL HIGH (ref 65–99)
Glucose-Capillary: 196 mg/dL — ABNORMAL HIGH (ref 65–99)

## 2015-11-22 LAB — MAGNESIUM: Magnesium: 2.5 mg/dL — ABNORMAL HIGH (ref 1.7–2.4)

## 2015-11-22 LAB — TRIGLYCERIDES: TRIGLYCERIDES: 181 mg/dL — AB (ref <150)

## 2015-11-22 LAB — PROCALCITONIN: Procalcitonin: 0.24 ng/mL

## 2015-11-22 MED ORDER — FREE WATER
200.0000 mL | Status: DC
  Administered 2015-11-22 – 2015-11-24 (×13): 200 mL

## 2015-11-22 MED ORDER — FUROSEMIDE 10 MG/ML IJ SOLN
40.0000 mg | Freq: Once | INTRAMUSCULAR | Status: AC
  Administered 2015-11-22: 40 mg via INTRAVENOUS
  Filled 2015-11-22: qty 4

## 2015-11-22 MED ORDER — GUAIFENESIN 100 MG/5ML PO SOLN
15.0000 mL | ORAL | Status: DC
  Administered 2015-11-22 – 2015-12-04 (×63): 300 mg
  Filled 2015-11-22 (×10): qty 15
  Filled 2015-11-22: qty 5
  Filled 2015-11-22 (×3): qty 15
  Filled 2015-11-22: qty 5
  Filled 2015-11-22: qty 45
  Filled 2015-11-22: qty 15
  Filled 2015-11-22: qty 10
  Filled 2015-11-22: qty 15
  Filled 2015-11-22: qty 5
  Filled 2015-11-22 (×2): qty 15
  Filled 2015-11-22: qty 5
  Filled 2015-11-22 (×7): qty 15
  Filled 2015-11-22: qty 45
  Filled 2015-11-22: qty 15
  Filled 2015-11-22: qty 10
  Filled 2015-11-22: qty 15
  Filled 2015-11-22: qty 10
  Filled 2015-11-22: qty 5
  Filled 2015-11-22: qty 15
  Filled 2015-11-22: qty 75
  Filled 2015-11-22 (×9): qty 15
  Filled 2015-11-22: qty 45
  Filled 2015-11-22: qty 15
  Filled 2015-11-22: qty 30
  Filled 2015-11-22 (×8): qty 15
  Filled 2015-11-22: qty 5
  Filled 2015-11-22: qty 45
  Filled 2015-11-22: qty 15
  Filled 2015-11-22: qty 10
  Filled 2015-11-22 (×2): qty 15

## 2015-11-22 MED ORDER — CARVEDILOL 6.25 MG PO TABS
6.2500 mg | ORAL_TABLET | Freq: Two times a day (BID) | ORAL | Status: DC
  Administered 2015-11-22 – 2015-11-24 (×5): 6.25 mg via ORAL
  Filled 2015-11-22 (×5): qty 1

## 2015-11-22 MED ORDER — OXYMETAZOLINE HCL 0.05 % NA SOLN
1.0000 | Freq: Two times a day (BID) | NASAL | Status: DC
  Administered 2015-11-22 – 2015-11-28 (×9): 1 via NASAL
  Filled 2015-11-22: qty 15

## 2015-11-22 MED ORDER — DEXAMETHASONE SODIUM PHOSPHATE 4 MG/ML IJ SOLN
4.0000 mg | Freq: Four times a day (QID) | INTRAMUSCULAR | Status: AC
  Administered 2015-11-22 – 2015-11-23 (×6): 4 mg via INTRAVENOUS
  Filled 2015-11-22 (×6): qty 1

## 2015-11-22 MED ORDER — POTASSIUM PHOSPHATES 15 MMOLE/5ML IV SOLN
30.0000 mmol | Freq: Once | INTRAVENOUS | Status: AC
  Administered 2015-11-22: 30 mmol via INTRAVENOUS
  Filled 2015-11-22: qty 10

## 2015-11-22 MED ORDER — POTASSIUM CHLORIDE 20 MEQ/15ML (10%) PO SOLN
40.0000 meq | Freq: Once | ORAL | Status: AC
  Administered 2015-11-22: 40 meq via ORAL
  Filled 2015-11-22: qty 30

## 2015-11-22 NOTE — Progress Notes (Signed)
Rehab Admissions Coordinator Note:  Patient was screened by Retta Diones for appropriateness for an Inpatient Acute Rehab Consult.  At this time, we are recommending Inpatient Rehab consult.  Jodell Cipro M 11/22/2015, 2:09 PM  I can be reached at [phone].

## 2015-11-22 NOTE — Progress Notes (Signed)
SLP Cancellation Note  Patient Details Name: [name] MRN: [id number] DOB: [birth year]   Cancelled treatment:       Reason Eval/Treat Not Completed: Medical issues which prohibited therapy. Per chart review and discussion with RN, pt has had a decline in his respiratory status today, with stridor and tachypnea; concern for possible re-intubation. Upon my arrival, pt has secretions pooling in his oral cavity and spilling anteriorly from his mouth - RN providing suction. RR is hovering at 37 and SpO2 at 92% on high flow [place]. Will f/u as able for PO trials. Would continue strict NPO.   [name], M.A. CCC-SLP [phone]  [name] 11/22/2015, 3:05 PM

## 2015-11-22 NOTE — Progress Notes (Signed)
Patient ID: [name], male   DOB: September 17, 1946, 69 y.o.   MRN: [id number]    SUBJECTIVE: Extubated 12/8.  Still has NG tube with tube feeds (failed swallow study).  Speaks softly but seems oriented.  No chest pain. +Cough, remains on 5L [place].    Scheduled Meds: . antiseptic oral rinse  7 mL Mouth Rinse q12n4p  . aspirin  81 mg Oral Daily  . atorvastatin  80 mg Oral q1800  . aztreonam  1 g Intravenous 3 times per day  . carvedilol  6.25 mg Oral BID WC  . chlorhexidine  15 mL Mouth Rinse BID  . famotidine  20 mg Oral BID  . fluticasone  2 spray Each Nare Daily  . furosemide  40 mg Intravenous Once  . heparin  5,000 Units Subcutaneous 3 times per day  . insulin aspart  0-20 Units Subcutaneous 6 times per day  . insulin aspart  3 Units Subcutaneous 6 times per day  . potassium chloride  40 mEq Oral Once  . QUEtiapine  50 mg Oral BID  . ramipril  2.5 mg Oral Daily  . sodium chloride  10-40 mL Intracatheter Q12H  . sodium chloride  3 mL Intravenous Q12H  . tamsulosin  0.4 mg Oral Daily  . ticagrelor  90 mg Oral BID  . traZODone  100 mg Oral QHS  . vancomycin  1,000 mg Intravenous Q12H   Continuous Infusions: . sodium chloride 10 mL/hr at 11/21/15 0800  . dexmedetomidine Stopped (11/20/15 1200)  . feeding supplement (VITAL AF 1.2 CAL) 1,000 mL (11/21/15 1718)  . fentaNYL infusion INTRAVENOUS Stopped (11/20/15 1200)  . propofol (DIPRIVAN) infusion Stopped (11/20/15 1200)   PRN Meds:.sodium chloride, acetaminophen, ALPRAZolam, fentaNYL, haloperidol lactate, ondansetron (ZOFRAN) IV, sennosides, sodium chloride, sodium chloride, sodium chloride    Filed Vitals:   11/22/15 0300 11/22/15 0400 11/22/15 0500 11/22/15 0600  BP: 141/74 132/72 128/67 130/67  Pulse: 116 118 114 114  Temp:  99.1 F (37.3 C)    TempSrc:  Oral    Resp: 33 30 27 31   Height:      Weight:   182 lb 1.6 oz (82.6 kg)   SpO2: 93% 93% 92% 89%    Intake/Output Summary (Last 24 hours) at 11/22/15 0738 Last  data filed at 11/22/15 [id number]  Gross per 24 hour  Intake   1253 ml  Output    900 ml  Net    353 ml    LABS: Basic Metabolic Panel:  Recent Labs  11/21/15 0438 11/22/15 0431  NA 148* 148*  K 3.4* 3.6  CL 110 117*  CO2 29 25  GLUCOSE 148* 196*  BUN 31* 39*  CREATININE 0.96 0.94  CALCIUM 8.7* 8.5*  MG 2.2 2.5*  PHOS 3.0 2.2*   Liver Function Tests:  Recent Labs  11/21/15 0437 11/22/15 0431  ALBUMIN 2.7* 2.5*   No results for input(s): LIPASE, AMYLASE in the last 72 hours. CBC:  Recent Labs  11/21/15 0438 11/22/15 0431  WBC 17.7* 21.1*  NEUTROABS 15.7* 19.3*  HGB 12.4* 13.0  HCT 37.2* 39.5  MCV 94.9 95.9  PLT 231 264   Cardiac Enzymes: No results for input(s): CKTOTAL, CKMB, CKMBINDEX, TROPONINI in the last 72 hours. BNP: Invalid input(s): POCBNP D-Dimer: No results for input(s): DDIMER in the last 72 hours. Hemoglobin A1C: No results for input(s): HGBA1C in the last 72 hours. Fasting Lipid Panel:  Recent Labs  11/22/15 0431  TRIG 181*   Thyroid  Function Tests: No results for input(s): TSH, T4TOTAL, T3FREE, THYROIDAB in the last 72 hours.  Invalid input(s): FREET3 Anemia Panel: No results for input(s): VITAMINB12, FOLATE, FERRITIN, TIBC, IRON, RETICCTPCT in the last 72 hours.  RADIOLOGY: Dg Chest 1 View  11/19/2015  CLINICAL DATA:  69 year old male status post V fib arrest. Respiratory failure. Initial encounter. EXAM: CHEST 1 VIEW COMPARISON:  11/18/2015 and earlier. FINDINGS: Portable AP semi upright view at 1007 hours. Endotracheal tube tip remains in good position between the clavicles and carina. Enteric tube courses to the abdomen, tip not included. Stable left IJ central line. Improved right lung base ventilation, regressed opacity. Continued dense left lung base/retrocardiac opacity with some air bronchograms. No pneumothorax or pulmonary edema. Probable small pleural effusions. Stable cardiac size and mediastinal contours. IMPRESSION: 1.   Stable lines and tubes. 2. Interval improved right lung base ventilation. Residual left lower lobe collapse/consolidation. Probable small effusions. Electronically Signed   By: Genevie Ann M.D.   On: 11/19/2015 10:15   Dg Abd 1 View  11/21/2015  CLINICAL DATA:  Feeding tube placement EXAM: ABDOMEN - 1 VIEW COMPARISON:  November 18, 2015 FINDINGS: Nasogastric tube no longer present. Feeding tube tip is at the level of the first portion of the duodenum. The bowel gas pattern is unremarkable. There is moderate stool in the colon. IMPRESSION: Feeding tube tip at the level of the proximal duodenum. Bowel gas pattern unremarkable. Electronically Signed   By: Lowella Grip III M.D.   On: 11/21/2015 17:02   Ct Head Wo Contrast  11/11/2015  CLINICAL DATA:  69 year old male with cardiac arrest. EXAM: CT HEAD WITHOUT CONTRAST TECHNIQUE: Contiguous axial images were obtained from the base of the skull through the vertex without intravenous contrast. COMPARISON:  Head CT dated 03/29/2015 FINDINGS: There is slight prominence of the ventricles and sulci compatible with age-related volume loss. Mild periventricular and deep white matter hypodensities represent chronic microvascular ischemic changes. There is no intracranial hemorrhage. No mass effect or midline shift identified. The visualized paranasal sinuses and mastoid air cells are well aerated. The calvarium is intact. IMPRESSION: No acute intracranial pathology. Mild age-related atrophy and chronic microvascular ischemic disease. If symptoms persist and there are no contraindications, MRI may provide better evaluation if clinically indicated Electronically Signed   By: Anner Crete M.D.   On: 11/11/2015 23:44   Dg Chest Port 1 View  11/22/2015  CLINICAL DATA:  Acute respiratory failure EXAM: PORTABLE CHEST 1 VIEW COMPARISON:  November 21, 2015 FINDINGS: There is patchy atelectasis in the left base, unchanged. Lungs elsewhere clear. Heart is upper normal in size  with pulmonary vascularity within normal limits. No adenopathy. Feeding tube tip is below the diaphragm. IMPRESSION: Persistent patchy atelectasis left base. No new opacity. No change in cardiac silhouette. Electronically Signed   By: Lowella Grip III M.D.   On: 11/22/2015 07:03   Dg Chest Port 1 View  11/21/2015  CLINICAL DATA:  Cough with purulent sputum EXAM: PORTABLE CHEST 1 VIEW COMPARISON:  Yesterday FINDINGS: There is improved aeration, especially at the left lung base, with residual streaky lung opacities at least partially from atelectasis. No pulmonary edema or pleural effusion. No pneumothorax. Normal heart size and mediastinal contours. IMPRESSION: 1. Improved inflation, especially in the left lower lobe, after extubation. 2. Patchy bilateral atelectasis or bronchopneumonia. Electronically Signed   By: Monte Fantasia M.D.   On: 11/21/2015 11:19   Dg Chest Port 1 View  11/20/2015  CLINICAL DATA:  Respiratory failure.  EXAM: PORTABLE CHEST 1 VIEW COMPARISON:  11/19/2015.  11/17/2015.  CT 03/29/2015. FINDINGS: Endotracheal tube, NG tube, left IJ line stable position. Worsening bibasilar atelectasis and or infiltrates/edema. Small left pleural effusion. No pneumothorax. Stable cardiomegaly. Multiple left rib fractures again noted. IMPRESSION: 1. Lines and tubes in stable position. 2. Progressive bibasilar atelectasis and/or infiltrates/edema. Small left pleural effusion . 3. Stable cardiomegaly. Electronically Signed   By: Marcello Moores  Register   On: 11/20/2015 07:08   Dg Chest Port 1 View  11/18/2015  CLINICAL DATA:  69 year old male with acute respiratory failure. Status post cardiac arrest. Initial encounter. EXAM: PORTABLE CHEST 1 VIEW COMPARISON:  11/17/2015 and earlier. FINDINGS: Portable AP semi upright view at 0514 hours. Stable endotracheal tube. Stable visualized enteric tube coursing to the abdomen. Stable left IJ central line. Increased veiling opacity at the right lung base. Continued  dense retrocardiac opacity. Continued left and new right perihilar interstitial opacity. No pneumothorax. Small left side effusion suspected. IMPRESSION: 1.  Stable lines and tubes. 2. Interval increased bilateral perihilar opacity and right pleural effusion. Favor pulmonary interstitial edema over infection. 3. Continued dense lower lobe collapse/consolidation. Suspect stable small left effusion. Electronically Signed   By: Genevie Ann M.D.   On: 11/18/2015 07:12   Dg Chest Port 1 View  11/17/2015  CLINICAL DATA:  Acute respiratory failure, CHF, and coronary artery disease EXAM: PORTABLE CHEST 1 VIEW COMPARISON:  Portable chest x-ray of November 15, 2015 FINDINGS: The lungs are adequately inflated. There has been further development of alveolar opacity in the left mid and lower lung. The retrocardiac region is opacified and the left hemidiaphragm is largely obscured. There is persistent alveolar opacity in the right infrahilar region. The heart is mildly enlarged. The pulmonary vascularity is mildly engorged. The endotracheal tube tip projects 4.9 cm above the carina. The esophagogastric tube tip projects below the inferior margin of the image. The left internal jugular venous catheter tip projects over the midportion of the SVC. IMPRESSION: Slight interval worsening of alveolar opacities on the left compatible with pneumonia or less likely pulmonary edema. Stable subsegmental atelectasis or early infiltrate in the right infrahilar region. Low-grade CHF. Electronically Signed   By: David  Martinique M.D.   On: 11/17/2015 07:11   Dg Chest Port 1 View  11/15/2015  CLINICAL DATA:  Pulmonary edema.  Recent cardiac catheterization. EXAM: PORTABLE CHEST 1 VIEW COMPARISON:  11/14/2015 and 11/13/2015. FINDINGS: 0524 hours. Endotracheal tube is unchanged, approximately 3 cm above the carina. Left IJ central venous catheter and nasogastric tube are unchanged. The heart size and mediastinal contours are stable. Bibasilar  airspace opacities have not significantly changed. There is no pneumothorax or significant pleural effusion. IMPRESSION: Stable chest with bibasilar airspace opacities. Stable support system. Electronically Signed   By: Richardean Sale M.D.   On: 11/15/2015 09:14   Dg Chest Port 1 View  11/14/2015  CLINICAL DATA:  Hypoxia EXAM: PORTABLE CHEST 1 VIEW COMPARISON:  November 13, 2015 FINDINGS: Endotracheal tube tip is 3.1 cm above the carina. Nasogastric tube tip and side port below the diaphragm. Central catheter tip is in the superior vena cava. No pneumothorax. There is airspace consolidation in the left base. There is mild bibasilar interstitial edema. There is a small left pleural effusion. Heart is slightly enlarged with pulmonary vascularity within normal limits. No adenopathy. IMPRESSION: Tube and catheter positions as described without pneumothorax. Small left effusion with bibasilar edema. Suspect a degree of congestive heart failure. Consolidation in the left base may represent  alveolar edema but does suggest potential underlying pneumonia. No new opacity appreciable compared to 1 day prior. Electronically Signed   By: Lowella Grip III M.D.   On: 11/14/2015 07:21   Dg Chest Port 1 View  11/13/2015  CLINICAL DATA:  Cardiac arrest, acute respiratory failure, CHF. EXAM: PORTABLE CHEST 1 VIEW COMPARISON:  Portable chest x-ray of November 12, 2015 FINDINGS: The lungs are adequately inflated. The retrocardiac region remains dense on the left. The interstitial markings at the right lung base are slightly more conspicuous today. There is no pneumothorax nor large pleural effusion. The heart is top-normal in size. The pulmonary vascularity is not clearly engorged. The endotracheal tube tip lies approximately 2.5 cm above the carina. The esophagogastric tube tip projects below the inferior margin of the image. The left internal jugular venous catheter tip projects over the midportion of the SVC. External  pacemaker defibrillator pads are present. IMPRESSION: Increased interstitial density in both lung bases consistent with infiltrate or pulmonary edema. The upper lobe pulmonary vascularity is only minimally prominent. Stable support tubes. Electronically Signed   By: David  Martinique M.D.   On: 11/13/2015 07:14   Dg Chest Port 1 View  11/12/2015  CLINICAL DATA:  Central line placement EXAM: PORTABLE CHEST 1 VIEW COMPARISON:  11/11/2015 FINDINGS: The endotracheal tube is 4.7 cm above the carina. Nasogastric tube extends into the stomach. There is a new left jugular central line with tip in the low SVC. There is no pneumothorax. Ground-glass opacities are present in the central and basilar regions bilaterally, worsened. This could represent alveolar edema or infectious infiltrate. No large effusions. The left hemi thorax is superimposed by a percutaneous pacing patch. IMPRESSION: 1. New left jugular central line with tip in the low SVC. No pneumothorax. 2. Satisfactorily positioned endotracheal tube and nasogastric tube. 3. New/worsening central alveolar edema or infiltrate. Electronically Signed   By: Andreas Newport M.D.   On: 11/12/2015 06:00   Dg Chest Portable 1 View  11/11/2015  CLINICAL DATA:  Myocardial infarction.  Post resuscitation. EXAM: PORTABLE CHEST 1 VIEW COMPARISON:  03/29/2015 FINDINGS: Endotracheal tube tip is 2.6 cm above the carina. The nasogastric tube extends into the stomach. The lungs are clear except for mild atelectatic appearing linear basilar opacities on the left. No confluent airspace consolidation. No large effusion. No pneumothorax. Mild central vascular prominence, which can be physiologic in the supine position. IMPRESSION: Support equipment appears satisfactorily positioned. Electronically Signed   By: Andreas Newport M.D.   On: 11/11/2015 22:12   Dg Abd Portable 1v  11/18/2015  CLINICAL DATA:  Orogastric tube placement EXAM: PORTABLE ABDOMEN - 1 VIEW COMPARISON:  CT  abdomen and pelvis March 29, 2015 FINDINGS: Orogastric tube tip and side port are in the stomach. There is moderate stool throughout the colon. There is no bowel dilatation or air-fluid level suggesting obstruction. No free air is seen on this supine examination. IMPRESSION: Orogastric tube tip and side port in stomach. Bowel gas pattern unremarkable. Moderate stool throughout colon. Electronically Signed   By: Lowella Grip III M.D.   On: 11/18/2015 08:15    PHYSICAL EXAM General: NAD Neck: JVP ?8-9 cm, no thyromegaly or thyroid nodule.  Lungs: Rhonchi bilaterally CV: Nondisplaced PMI.  Heart mildly tachy, regular S1/S2, no S3/S4, no murmur.  No peripheral edema.   Abdomen: Soft, nontender, no hepatosplenomegaly, no distention.  Neurologic: Alert and oriented x 3.  Psych: Normal affect. Extremities: No clubbing or cyanosis.   TELEMETRY: Reviewed telemetry pt  in sinus tachy in 110s  ASSESSMENT AND PLAN: 69 yo with history of out-of-hospital ventricular fibrillation arrest in setting of anterior STEMI now s/p LAD PCI.  He had prolonged intubation, now extubated with suspected HCAP.  1. Ventricular fibrillation arrest: In setting of anterior STEMI, s/p treatment.  Continue beta blocker.  2. CAD: Anterior STEMI, s/p DES to LAD.   - Continue ASA 81 and ticagrelor - Continue atorvastatin 80 daily.  3. Acute/chronic systolic CHF: EF initially 25% on LV-gram, improved on echo to 50-55% after intervention.  Volume status difficult on exam, may have some volume overload.  He remains on 5L O2 by nasal cannula.   - Continue ramipril 2.5 daily.  - I will give Lasix 40 mg IV bid x 2 doses and reassess. - Increase Coreg to 6.25 mg bid.  4. HCAP: Suspected.  CXR today with left basilar infiltrate.  On vancomycin and aztreonam. 5. Nutrition: Has NGT and getting TFs. Failed swallow study.   Loralie Champagne 11/22/2015 7:44 AM

## 2015-11-22 NOTE — Progress Notes (Signed)
PULMONARY / CRITICAL CARE MEDICINE   Name: [name] MRN: [id number] DOB: [birth year]    ADMISSION DATE:  11/11/2015 CONSULTATION DATE:  11/11/2015  REFERRING MD:  Dr. Burt Knack  CHIEF COMPLAINT:  Cardiac arrest  HISTORY OF PRESENT ILLNESS:  69 year old male with PMH of RA and HTN. He presented to [facility] ED 11/29 after a witnessed cardiac arrest at home. He has had intermittent chest and arm pain for about 2 weeks status post mechanical fall. 11/29 chest pain acutely worsened and he unfortunately suffered a cardiac arrest, which was witnessed by his wife and EMS was called. Family initiated CPR. EMS arrived and AED was placed advising shock. 2 shocks were delivered and ROSC was achieved. 2 rounds epinephrine also administerred. EKG in the field was concerning for STEMI and code STEMI was called. In ED patient was unresponsive, he was intubated and taken emergently to cardiac cath lab where he remains at this time. Occlusive LAD lesion identified and intervened upon. PCCM consulted for ICU care. Total downtime estimated at less than 15 mins.   SUBJECTIVE:   Wants to get OOB  VITAL SIGNS: BP 135/72 mmHg  Pulse 118  Temp(Src) 98.1 F (36.7 C) (Oral)  Resp 31  Ht 5\' 7"  (1.702 m)  Wt 82.6 kg (182 lb 1.6 oz)  BMI 28.51 kg/m2  SpO2 92%  HEMODYNAMICS: CVP:  [8 mmHg] 8 mmHg     INTAKE / OUTPUT: I/O last 3 completed shifts: In: 1303 [I.V.:30; NG/GT:373; IV Piggyback:900] Out: 3100 [Urine:3100]  PHYSICAL EXAMINATION: General: Awake, extubated. Weak.  Neuro: No gross focal deficits; whispers. Has difficulty phonating  HEENT:Moist mucous membranes, marked upper airway noises  Cardiovascular: S1-S2, regular rate and rhythm, no murmurs rubs gallops  Lungs:scattered rhonchi exp wheeze, mild accessory muscle use  Abdomen: Soft, positive bowel sounds  Skin: Intact   LABS:  CBC  Recent Labs Lab 11/20/15 0410 11/21/15 0438 11/22/15 0431  WBC 9.6 17.7* 21.1*  HGB 10.9*  12.4* 13.0  HCT 33.9* 37.2* 39.5  PLT 205 231 264   Coag's No results for input(s): APTT, INR in the last 168 hours. BMET  Recent Labs Lab 11/21/15 0437 11/21/15 0438 11/22/15 0431  NA 148* 148* 148*  K 3.3* 3.4* 3.6  CL 111 110 117*  CO2 29 29 25   BUN 30* 31* 39*  CREATININE 0.99 0.96 0.94  GLUCOSE 149* 148* 196*   Electrolytes  Recent Labs Lab 11/20/15 0410 11/21/15 0437 11/21/15 0438 11/22/15 0431  CALCIUM 8.3*  8.3* 8.6* 8.7* 8.5*  MG 2.1  --  2.2 2.5*  PHOS 3.0 3.0 3.0 2.2*   Sepsis Markers  Recent Labs Lab 11/20/15 1059 11/21/15 0438 11/22/15 0431  PROCALCITON 0.45 0.32 0.24   ABG  Recent Labs Lab 11/18/15 1150  PHART 7.470*  PCO2ART 36.4  PO2ART 77.3*   Liver Enzymes  Recent Labs Lab 11/20/15 0410 11/21/15 0437 11/22/15 0431  ALBUMIN 2.1* 2.7* 2.5*   Cardiac Enzymes No results for input(s): TROPONINI, PROBNP in the last 168 hours. Glucose  Recent Labs Lab 11/21/15 0806 11/21/15 1204 11/21/15 1536 11/21/15 1944 11/21/15 2359 11/22/15 0327  GLUCAP 146* 177* 158* 165* 174* 193*    Imaging Dg Abd 1 View  11/21/2015  CLINICAL DATA:  Feeding tube placement EXAM: ABDOMEN - 1 VIEW COMPARISON:  November 18, 2015 FINDINGS: Nasogastric tube no longer present. Feeding tube tip is at the level of the first portion of the duodenum. The bowel gas pattern is unremarkable. There is  moderate stool in the colon. IMPRESSION: Feeding tube tip at the level of the proximal duodenum. Bowel gas pattern unremarkable. Electronically Signed   By: Lowella Grip III M.D.   On: 11/21/2015 17:02   Dg Chest Port 1 View  11/22/2015  CLINICAL DATA:  Acute respiratory failure EXAM: PORTABLE CHEST 1 VIEW COMPARISON:  November 21, 2015 FINDINGS: There is patchy atelectasis in the left base, unchanged. Lungs elsewhere clear. Heart is upper normal in size with pulmonary vascularity within normal limits. No adenopathy. Feeding tube tip is below the diaphragm.  IMPRESSION: Persistent patchy atelectasis left base. No new opacity. No change in cardiac silhouette. Electronically Signed   By: Lowella Grip III M.D.   On: 11/22/2015 07:03   Dg Chest Port 1 View  11/21/2015  CLINICAL DATA:  Cough with purulent sputum EXAM: PORTABLE CHEST 1 VIEW COMPARISON:  Yesterday FINDINGS: There is improved aeration, especially at the left lung base, with residual streaky lung opacities at least partially from atelectasis. No pulmonary edema or pleural effusion. No pneumothorax. Normal heart size and mediastinal contours. IMPRESSION: 1. Improved inflation, especially in the left lower lobe, after extubation. 2. Patchy bilateral atelectasis or bronchopneumonia. Electronically Signed   By: Monte Fantasia M.D.   On: 11/21/2015 11:19   Basilar L>R atx  STUDIES:  Nov 20, 2023  LHC - stent lad 20-Nov-2023  CT head - no acute abnormality 11/30  eeg - no seizure activity. suppression 12/03  Port CXR 12/3 - Bilat lower lung opacity.  CULTURES:  ANTIBIOTICS: Levaquin 11/30 >> 12/4 Vanco 12/9 >> Aztreonam 12/9 >>  SIGNIFICANT EVENTS: 11/20/2023 cardiac arrest > cath lab, out to ICU, therapeutic hypothermia.  12/02 completed rewarming 12/9 Extubated 12/10 getting OOB. Still weak, kept in ICU for intensive pulmonary toilet.   LINES/TUBES: ETT 2023-11-20 >> 12/8 CVL 2023-11-20 >> 12/9 Left radial 11-20-23 >> 12/3   ASSESSMENT / PLAN:  PULMONARY A: Acute Hypoxic Respiratory Failure Severe CAP vs Aspiration Pneumonia  HCAP Probable post-intubation Laryngeal edema  Extubated 12/9; some accessory muscle use and still w/ marked upper airway noises and slow to improve cough mechanics  Desaturating 12/10; not able to do flutter.  P: Add high flow O2 Cont nasal hygiene Get OOB  See ID protocol  Procalcitonin protocol  CARDIOVASCULAR A:  Cardiac arrest - VT/VF STEMI s/p PCI 11/20/2023 ST HTN ICM w ef 25%-->now 50-55% after intervention  P:  Care per Cardiology-->BB increased Asa,  Brilinta, Lopressor & Lipitor Follow triglycerides while on propofol  Lasix per cards   RENAL A:   Hypernatremia  Hypokalemia  Hypophosphatemia   P:   Monitor UOP / BMP Free water replacement  Renal function is stable  Replete potassium and PO4  GASTROINTESTINAL A:   Transaminitis - Shock liver. Resolving. At Risk Protein Calorie Malnutrition  Post intubation dysphagia  P:   Swallow eval pending Pepcid IV q12hr NPO until passes swallow eval  HEMATOLOGIC A:   Leukocytosis - Improving. Suspect stress response. Anemia  P:  Follow CBC Antiplatelet / anticoagulation per Cardiology Heparin [place] q8hr for DVT prophylaxis   INFECTIOUS A:   Possible CAP HCAP  P:   broad-spectrum antibiotics for HCAP started 12/9 Narrow abx as cultures specify  Monitor fever curve / WBC  ENDOCRINE A:   Hyperglycemia - No H/O DM.  P:   Accu-Checks q4hr-->change to AC/HS when taking POs SSI coverage  AUTOIMMUNE:  A:  Rheumatoid Arthritis - on DMARD & Methotrexate at baseline   P:  Hold home medications  NEUROLOGIC A:   ICU delirium-->seems to be clearing  Deconditioning s/p critical illness  P:   Dc precedex from profile Dc Seroquel, cont  Trazodone (home med). Haldol PRN. QTC okay Physical therapy evaluation ordered  FAMILY - Updates: Wife and daughter updated 12/9 - Inter-disciplinary family meet or Palliative Care meeting due by:  12/7  11/22/2015, 7:45 AM    TODAY'S SUMMARY:   Slowly improving. Has weak cough mechanics and still probably some laryngeal edema s/p intubation. Cards adjusting bb and diuretics. Will have to watch him as don't think that his pulmonary issues are as much volume as they are deconditioning, poor cough mechanics and upper airway issues associated w/ post-intubation laryngeal edema. Will try high-flow O2. From a pulmonary stand-point we will try to mobilize, cont to try flutter and focus on cough mechanics.  Also will replace free water and  begin titrating his antipsychotic regimen to off. I think he needs another day in the ICU as his pulmonary status remains tenuous. I am concerned that he remains a high risk for re-intubation. Will talk to Dr Lake Bells about thoughts on steroids for laryngeal edema component.   My critical care time 30 minutes.   Erick Colace ACNP-BC [place] Pager # [phone] OR # [phone] if no answer  11/22/2015, 7:45 AM    Attending:  I have seen and examined the patient with nurse practitioner/resident and agree with the note above.  My edits are in BOLD above.  We formulated the plan together and I elicited the following history.    [name] is tachypnic today but holding his own right now Feels like his nose is stopped up today  On exam: rhonchi all over, tachypnic, flail chest noted, belly soft, CV RRR no mg  CXR images reviewed: left lung infiltrate noted  HCAP> appropriate antibiotics being given, needs mucinex, sit up, close monitoring in ICU Acute respiratory failure with hypoxemia> tenuous respiratory status, mostly needs help with pulm toilette, add decadron for mild upper airway inflamation Rhinitis> add back afrin and saline sprays  Updated wife at length, let her know that he may need re-intubation but hopefully we can hold off  My cc time 33 minutes  Roselie Awkward, MD [place] [facility] Pager: [phone] Cell: [phone] After 3pm or if no response, call [phone]

## 2015-11-23 LAB — RENAL FUNCTION PANEL
ANION GAP: 8 (ref 5–15)
Albumin: 2.5 g/dL — ABNORMAL LOW (ref 3.5–5.0)
BUN: 41 mg/dL — ABNORMAL HIGH (ref 6–20)
CALCIUM: 8.6 mg/dL — AB (ref 8.9–10.3)
CO2: 28 mmol/L (ref 22–32)
CREATININE: 1 mg/dL (ref 0.61–1.24)
Chloride: 111 mmol/L (ref 101–111)
Glucose, Bld: 190 mg/dL — ABNORMAL HIGH (ref 65–99)
Phosphorus: 2.7 mg/dL (ref 2.5–4.6)
Potassium: 3.7 mmol/L (ref 3.5–5.1)
SODIUM: 147 mmol/L — AB (ref 135–145)

## 2015-11-23 LAB — CBC WITH DIFFERENTIAL/PLATELET
BASOS ABS: 0 10*3/uL (ref 0.0–0.1)
BASOS PCT: 0 %
EOS PCT: 0 %
Eosinophils Absolute: 0 10*3/uL (ref 0.0–0.7)
HEMATOCRIT: 38.7 % — AB (ref 39.0–52.0)
HEMOGLOBIN: 12.3 g/dL — AB (ref 13.0–17.0)
LYMPHS PCT: 3 %
Lymphs Abs: 0.8 10*3/uL (ref 0.7–4.0)
MCH: 30.6 pg (ref 26.0–34.0)
MCHC: 31.8 g/dL (ref 30.0–36.0)
MCV: 96.3 fL (ref 78.0–100.0)
MONOS PCT: 6 %
Monocytes Absolute: 1.6 10*3/uL — ABNORMAL HIGH (ref 0.1–1.0)
NEUTROS ABS: 23.7 10*3/uL — AB (ref 1.7–7.7)
Neutrophils Relative %: 91 %
Platelets: 319 10*3/uL (ref 150–400)
RBC: 4.02 MIL/uL — ABNORMAL LOW (ref 4.22–5.81)
RDW: 15.1 % (ref 11.5–15.5)
WBC: 26.1 10*3/uL — ABNORMAL HIGH (ref 4.0–10.5)

## 2015-11-23 LAB — GLUCOSE, CAPILLARY
GLUCOSE-CAPILLARY: 278 mg/dL — AB (ref 65–99)
Glucose-Capillary: 180 mg/dL — ABNORMAL HIGH (ref 65–99)
Glucose-Capillary: 197 mg/dL — ABNORMAL HIGH (ref 65–99)
Glucose-Capillary: 197 mg/dL — ABNORMAL HIGH (ref 65–99)
Glucose-Capillary: 233 mg/dL — ABNORMAL HIGH (ref 65–99)
Glucose-Capillary: 246 mg/dL — ABNORMAL HIGH (ref 65–99)

## 2015-11-23 LAB — CULTURE, RESPIRATORY W GRAM STAIN: Culture: NORMAL

## 2015-11-23 LAB — CULTURE, RESPIRATORY

## 2015-11-23 LAB — MAGNESIUM: Magnesium: 2.6 mg/dL — ABNORMAL HIGH (ref 1.7–2.4)

## 2015-11-23 LAB — TRIGLYCERIDES: TRIGLYCERIDES: 133 mg/dL (ref <150)

## 2015-11-23 NOTE — Progress Notes (Signed)
PULMONARY / CRITICAL CARE MEDICINE   Name: [name] MRN: [id number] DOB: 12/22/1945    ADMISSION DATE:  12/01/15 CONSULTATION DATE:  2015-12-01  REFERRING MD:  Dr. Burt Knack  CHIEF COMPLAINT:  Cardiac arrest  HISTORY OF PRESENT ILLNESS:  69 year old male with PMH of RA and HTN. He presented to [facility] ED 12-01-23 after a witnessed cardiac arrest at home. He has had intermittent chest and arm pain for about 2 weeks status post mechanical fall. 2023/12/01 chest pain acutely worsened and he unfortunately suffered a cardiac arrest, which was witnessed by his wife and EMS was called. Family initiated CPR. EMS arrived and AED was placed advising shock. 2 shocks were delivered and ROSC was achieved. 2 rounds epinephrine also administerred. EKG in the field was concerning for STEMI and code STEMI was called. In ED patient was unresponsive, he was intubated and taken emergently to cardiac cath lab where he remains at this time. Occlusive LAD lesion identified and intervened upon. PCCM consulted for ICU care. Total downtime estimated at less than 15 mins.   SUBJECTIVE:   Wants to get OOB  VITAL SIGNS: BP 126/66 mmHg  Pulse 103  Temp(Src) 99.1 F (37.3 C) (Oral)  Resp 26  Ht 5\' 7"  (1.702 m)  Wt 80.6 kg (177 lb 11.1 oz)  BMI 27.82 kg/m2  SpO2 99% 40% high flow  HEMODYNAMICS:    Vent Mode:  [-]  FiO2 (%):  [40 %-70 %] 40 %  INTAKE / OUTPUT: I/O last 3 completed shifts: In: 4470.8 [I.V.:435.8; NG/GT:3185; IV [id number] Out: [id number] [Urine:3650]  PHYSICAL EXAMINATION: General: Awake, extubated. Much stronger Neuro: No gross focal deficits; whispers. Has speech quality much improved  HEENT:Moist mucous membranes, marked upper airway noises have decreased  Cardiovascular: S1-S2, regular rate and rhythm, no murmurs rubs gallops  Lungs:scattered rhonchi improve w/ cough. No accessory muscle use Abdomen: Soft, positive bowel sounds  Skin: Intact   LABS:  CBC  Recent Labs Lab  11/21/15 0438 11/22/15 0431 11/23/15 0331  WBC 17.7* 21.1* 26.1*  HGB 12.4* 13.0 12.3*  HCT 37.2* 39.5 38.7*  PLT 231 264 319   Coag's No results for input(s): APTT, INR in the last 168 hours. BMET  Recent Labs Lab 11/21/15 0438 11/22/15 0431 11/23/15 0331  NA 148* 148* 147*  K 3.4* 3.6 3.7  CL 110 117* 111  CO2 29 25 28   BUN 31* 39* 41*  CREATININE 0.96 0.94 1.00  GLUCOSE 148* 196* 190*   Electrolytes  Recent Labs Lab 11/21/15 0438 11/22/15 0431 11/23/15 0331  CALCIUM 8.7* 8.5* 8.6*  MG 2.2 2.5* 2.6*  PHOS 3.0 2.2* 2.7   Sepsis Markers  Recent Labs Lab 11/20/15 1059 11/21/15 0438 11/22/15 0431  PROCALCITON 0.45 0.32 0.24   ABG  Recent Labs Lab 11/18/15 1150  PHART 7.470*  PCO2ART 36.4  PO2ART 77.3*   Liver Enzymes  Recent Labs Lab 11/21/15 0437 11/22/15 0431 11/23/15 0331  ALBUMIN 2.7* 2.5* 2.5*   Cardiac Enzymes No results for input(s): TROPONINI, PROBNP in the last 168 hours. Glucose  Recent Labs Lab 11/22/15 1155 11/22/15 1624 11/22/15 2015 11/22/15 2342 11/23/15 0344 11/23/15 0744  GLUCAP 233* 191* 196* 215* 246* 197*    Imaging No results found. Basilar L>R atx  STUDIES:  Dec 01, 2023  LHC - stent lad 01-Dec-2023  CT head - no acute abnormality 11/30  eeg - no seizure activity. suppression 12/03  Port CXR 12/3 - Bilat lower lung opacity.  CULTURES:  ANTIBIOTICS: Levaquin  11/30 >> 12/4 Vanco 12/9 >> Aztreonam 12/9 >>  SIGNIFICANT EVENTS: 11/29 cardiac arrest > cath lab, out to ICU, therapeutic hypothermia.  12/02 completed rewarming 12/9 Extubated 12/10 getting OOB. Still weak, kept in ICU for intensive pulmonary toilet. Added decadron for Laryngeal edema.  12/11 remarkably better. Phonation improved. Getting OOB.   LINES/TUBES: ETT 11/29 >> 12/8 CVL 11/29 >> 12/9 Left radial 11/29 >> 12/3   ASSESSMENT / PLAN:  PULMONARY A: Acute Hypoxic Respiratory Failure Severe CAP vs Aspiration Pneumonia  HCAP Probable  post-intubation Laryngeal edema  Extubated 12/9; some accessory muscle use and still w/ marked upper airway noises and slow to improve cough mechanics  Desaturating 12/10; not able to do flutter. Remarkably better 12/11. Likely a mix of steroids for laryngeal edema, abx for PNA +/- lasix.  P: Add high flow O2-->wean to regular cannula  Cont nasal hygiene Get OOB  See ID protocol  Procalcitonin protocol  CARDIOVASCULAR A:  Cardiac arrest - VT/VF STEMI s/p PCI 11/29 ST HTN ICM w ef 25%-->now 50-55% after intervention  P:  Care per Cardiology-->BB increased Asa, Brilinta, Lopressor & Lipitor   RENAL A:   Hypernatremia   P:   Monitor UOP / BMP Free water replacement   GASTROINTESTINAL A:   Transaminitis - Shock liver. Resolving. At Risk Protein Calorie Malnutrition  Post intubation dysphagia  P:   Swallow eval pending Pepcid IV q12hr NPO until passes swallow eval  HEMATOLOGIC A:   Leukocytosis - Improving. Suspect stress response & exacerbated by steroids Anemia  P:  Follow CBC Antiplatelet / anticoagulation per Cardiology Heparin [place] q8hr for DVT prophylaxis   INFECTIOUS A:   Possible CAP HCAP  P:   broad-spectrum antibiotics for HCAP started 12/9 Narrow abx as cultures specify  Monitor fever curve / WBC  ENDOCRINE A:   Hyperglycemia - No H/O DM.  P:   Accu-Checks q4hr-->change to AC/HS when taking POs SSI coverage  AUTOIMMUNE:  A:  Rheumatoid Arthritis - on DMARD & Methotrexate at baseline   P:  Hold home medications  NEUROLOGIC A:   ICU delirium-->much better Deconditioning s/p critical illness  P:   cont  Trazodone (home med). OOB Physical therapy evaluation ordered  FAMILY - Updates: Wife and daughter updated 12/9 - Inter-disciplinary family meet or Palliative Care meeting due by:  12/7  11/23/2015, 7:54 AM    TODAY'S SUMMARY:   Slowly improving. Yesterday major issue was deconditioning, poor cough mechanics and upper airway  issues associated w/ post-intubation laryngeal edema. We added decadron, continued abx for LLL HCAP, and escalated nasal and pulmonary hygiene. His voice quality, cough mechanics and overall strength are remarkably improved today. Suspect volume wise he is euvolemic. Today we will focus on strength and rehab efforts: get OOB, have SLP re-eval, get him back on regular N/C. For now we will cont current abx regimen. Should be able to mover out of ICU to SDU setting later today or tomorrow.   Erick Colace ACNP-BC [place] Pager # [phone] OR # [phone] if no answer  My critical care time 30 minutes.

## 2015-11-23 NOTE — Progress Notes (Signed)
Speech Language Pathology Treatment: Dysphagia  Patient Details Name: [name] MRN: [id number] DOB: 1946-08-04 Today's Date: 11/23/2015 Time: [id number] SLP Time Calculation (min) (ACUTE ONLY): 13 min  Assessment / Plan / Recommendation Clinical Impression  Pt with improved quality of phonation and stronger cough, although with persisting dysphonia s/p prolonged intubation.  Ice chips, thin liquids continue to elicit a wet cough, and RR continues to hover around the 30s, concerning for reduced airway protection due to laryngeal dysfunction and poor coordination of swallow-ventilatory sequence. Pt very eager to eat.  Teaspoons of puree were tolerated with no overt s/s of aspiration. Recommend allowing limited POs - pudding, applesauce, or other Dysphagia 1 consistencies - pending instrumental swallow study tomorrow.  Reviewed results/recs/rationale to pt, who is agreeable but has difficulty recalling specifics.  SLP will f/u next date.    HPI HPI: 69 year old male with PMH of RA and HTN. He presented to [facility] ED 11/29 after a witnessed cardiac arrest at home. Family initiated CPR. EMS arrived and AED was placed. Two shocks were delivered until ROSC was achieved. Total down time estimated at less than 15 minutes. Pt was intubated 11/29-12/8.      SLP Plan  Continue with current plan of care     Recommendations  Diet recommendations: NPO (except may have snack of pudding/applesauce ) Medication Administration: Via alternative means              Oral Care Recommendations: Oral care QID Follow up Recommendations:  (tba) Plan: Continue with current plan of care   Juan Quam Laurice 11/23/2015, 4:20 PM Euretha Najarro L. Tivis Ringer, [place] CCC/SLP Pager [phone]

## 2015-11-23 NOTE — Progress Notes (Addendum)
Pharmacy Antibiotic Follow-up Note  [name] is a 69 y.o. year-old male admitted on 11/11/2015.  The patient is currently on day 3 of vancomycin and aztreonam for pneumonia.  Assessment/Plan: After discussion with Marni Griffon of CCM, the current antibiotic(s) vancomycin and aztreonam will be narrowed to just aztreonam (vancomycin will be discontinued) due to negative respiratory culture and negative MRSA PCR. Continue Aztreonam 1 gm IV q8h for now, f/u LOT.   Temp (24hrs), Avg:98.9 F (37.2 C), Min:98.2 F (36.8 C), Max:99.6 F (37.6 C)   Recent Labs Lab 11/19/15 0455 11/20/15 0410 11/21/15 0438 11/22/15 0431 11/23/15 0331  WBC 9.0 9.6 17.7* 21.1* 26.1*    Recent Labs Lab 11/20/15 0410 11/21/15 0437 11/21/15 0438 11/22/15 0431 11/23/15 0331  CREATININE 1.05  1.05 0.99 0.96 0.94 1.00   Estimated Creatinine Clearance: 71.9 mL/min (by C-G formula based on Cr of 1).    Allergies  Allergen Reactions  . Contrast Media [Iodinated Diagnostic Agents] Hives    03/28/14 pt received 1 hour emergent prep and pt did good and had no complaints after scan.  . Ibuprofen     REACTION: unspecified  . Penicillins     REACTION: unspecified  . Sulfamethoxazole     REACTION: unspecified    Antimicrobials this admission: Levaquin 11/30 >> 12/4 Vancomycin 12/9 >> 12/11 Aztreonam 12/11 >>  Levels/dose changes this admission: none  Microbiology results: 12/9 Sputum: negative  11/30 MRSA PCR: negative  Thank you for allowing pharmacy to be a part of this patient's care.  Cassie L. Nicole Kindred, PharmD PGY2 Infectious Diseases Pharmacy Resident Pager: [phone] 11/23/2015 12:39 PM

## 2015-11-23 NOTE — Progress Notes (Signed)
Patient ID: FABRIZZIO GELHAR, male   DOB: 1946-06-23, 69 y.o.   MRN: [id number] P   SUBJECTIVE: Extubated 12/8.  Still has NG tube with tube feeds (failed swallow study).  Doing better today, breathing more easily.  Diuresed well, also got dexamethasone for suspected laryngeal edema.  Scheduled Meds: . antiseptic oral rinse  7 mL Mouth Rinse q12n4p  . aspirin  81 mg Oral Daily  . atorvastatin  80 mg Oral q1800  . aztreonam  1 g Intravenous 3 times per day  . carvedilol  6.25 mg Oral BID WC  . chlorhexidine  15 mL Mouth Rinse BID  . dexamethasone  4 mg Intravenous 4 times per day  . famotidine  20 mg Oral BID  . fluticasone  2 spray Each Nare Daily  . free water  200 mL Per Tube 6 times per day  . guaiFENesin  15 mL Per Tube 6 times per day  . heparin  5,000 Units Subcutaneous 3 times per day  . insulin aspart  0-20 Units Subcutaneous 6 times per day  . insulin aspart  3 Units Subcutaneous 6 times per day  . oxymetazoline  1 spray Each Nare BID  . ramipril  2.5 mg Oral Daily  . sodium chloride  10-40 mL Intracatheter Q12H  . sodium chloride  3 mL Intravenous Q12H  . tamsulosin  0.4 mg Oral Daily  . ticagrelor  90 mg Oral BID  . traZODone  100 mg Oral QHS  . vancomycin  1,000 mg Intravenous Q12H   Continuous Infusions: . sodium chloride 10 mL/hr at 11/22/15 2220  . feeding supplement (VITAL AF 1.2 CAL) 1,000 mL (11/22/15 1800)   PRN Meds:.sodium chloride, acetaminophen, ALPRAZolam, haloperidol lactate, ondansetron (ZOFRAN) IV, sennosides, sodium chloride, sodium chloride, sodium chloride    Filed Vitals:   11/23/15 0500 11/23/15 0600 11/23/15 0700 11/23/15 0755  BP: 119/70 126/65 126/66   Pulse: 104 102 103 102  Temp:      TempSrc:      Resp: 27 28 26    Height:      Weight:      SpO2: 97% 98% 99%     Intake/Output Summary (Last 24 hours) at 11/23/15 0758 Last data filed at 11/23/15 0700  Gross per 24 hour  Intake 3050.83 ml  Output   3300 ml  Net -249.17 ml     LABS: Basic Metabolic Panel:  Recent Labs  11/22/15 0431 11/23/15 0331  NA 148* 147*  K 3.6 3.7  CL 117* 111  CO2 25 28  GLUCOSE 196* 190*  BUN 39* 41*  CREATININE 0.94 1.00  CALCIUM 8.5* 8.6*  MG 2.5* 2.6*  PHOS 2.2* 2.7   Liver Function Tests:  Recent Labs  11/22/15 0431 11/23/15 0331  ALBUMIN 2.5* 2.5*   No results for input(s): LIPASE, AMYLASE in the last 72 hours. CBC:  Recent Labs  11/22/15 0431 11/23/15 0331  WBC 21.1* 26.1*  NEUTROABS 19.3* 23.7*  HGB 13.0 12.3*  HCT 39.5 38.7*  MCV 95.9 96.3  PLT 264 319   Cardiac Enzymes: No results for input(s): CKTOTAL, CKMB, CKMBINDEX, TROPONINI in the last 72 hours. BNP: Invalid input(s): POCBNP D-Dimer: No results for input(s): DDIMER in the last 72 hours. Hemoglobin A1C: No results for input(s): HGBA1C in the last 72 hours. Fasting Lipid Panel:  Recent Labs  11/23/15 0331  TRIG 133   Thyroid Function Tests: No results for input(s): TSH, T4TOTAL, T3FREE, THYROIDAB in the last 72 hours.  Invalid  input(s): FREET3 Anemia Panel: No results for input(s): VITAMINB12, FOLATE, FERRITIN, TIBC, IRON, RETICCTPCT in the last 72 hours.  RADIOLOGY: Dg Chest 1 View  11/19/2015  CLINICAL DATA:  69 year old male status post V fib arrest. Respiratory failure. Initial encounter. EXAM: CHEST 1 VIEW COMPARISON:  11/18/2015 and earlier. FINDINGS: Portable AP semi upright view at 1007 hours. Endotracheal tube tip remains in good position between the clavicles and carina. Enteric tube courses to the abdomen, tip not included. Stable left IJ central line. Improved right lung base ventilation, regressed opacity. Continued dense left lung base/retrocardiac opacity with some air bronchograms. No pneumothorax or pulmonary edema. Probable small pleural effusions. Stable cardiac size and mediastinal contours. IMPRESSION: 1.  Stable lines and tubes. 2. Interval improved right lung base ventilation. Residual left lower lobe  collapse/consolidation. Probable small effusions. Electronically Signed   By: Genevie Ann M.D.   On: 11/19/2015 10:15   Dg Abd 1 View  11/21/2015  CLINICAL DATA:  Feeding tube placement EXAM: ABDOMEN - 1 VIEW COMPARISON:  November 18, 2015 FINDINGS: Nasogastric tube no longer present. Feeding tube tip is at the level of the first portion of the duodenum. The bowel gas pattern is unremarkable. There is moderate stool in the colon. IMPRESSION: Feeding tube tip at the level of the proximal duodenum. Bowel gas pattern unremarkable. Electronically Signed   By: Lowella Grip III M.D.   On: 11/21/2015 17:02   Ct Head Wo Contrast  11/11/2015  CLINICAL DATA:  69 year old male with cardiac arrest. EXAM: CT HEAD WITHOUT CONTRAST TECHNIQUE: Contiguous axial images were obtained from the base of the skull through the vertex without intravenous contrast. COMPARISON:  Head CT dated 03/29/2015 FINDINGS: There is slight prominence of the ventricles and sulci compatible with age-related volume loss. Mild periventricular and deep white matter hypodensities represent chronic microvascular ischemic changes. There is no intracranial hemorrhage. No mass effect or midline shift identified. The visualized paranasal sinuses and mastoid air cells are well aerated. The calvarium is intact. IMPRESSION: No acute intracranial pathology. Mild age-related atrophy and chronic microvascular ischemic disease. If symptoms persist and there are no contraindications, MRI may provide better evaluation if clinically indicated Electronically Signed   By: Anner Crete M.D.   On: 11/11/2015 23:44   Dg Chest Port 1 View  11/22/2015  CLINICAL DATA:  Acute respiratory failure EXAM: PORTABLE CHEST 1 VIEW COMPARISON:  November 21, 2015 FINDINGS: There is patchy atelectasis in the left base, unchanged. Lungs elsewhere clear. Heart is upper normal in size with pulmonary vascularity within normal limits. No adenopathy. Feeding tube tip is below the  diaphragm. IMPRESSION: Persistent patchy atelectasis left base. No new opacity. No change in cardiac silhouette. Electronically Signed   By: Lowella Grip III M.D.   On: 11/22/2015 07:03   Dg Chest Port 1 View  11/21/2015  CLINICAL DATA:  Cough with purulent sputum EXAM: PORTABLE CHEST 1 VIEW COMPARISON:  Yesterday FINDINGS: There is improved aeration, especially at the left lung base, with residual streaky lung opacities at least partially from atelectasis. No pulmonary edema or pleural effusion. No pneumothorax. Normal heart size and mediastinal contours. IMPRESSION: 1. Improved inflation, especially in the left lower lobe, after extubation. 2. Patchy bilateral atelectasis or bronchopneumonia. Electronically Signed   By: Monte Fantasia M.D.   On: 11/21/2015 11:19   Dg Chest Port 1 View  11/20/2015  CLINICAL DATA:  Respiratory failure. EXAM: PORTABLE CHEST 1 VIEW COMPARISON:  11/19/2015.  11/17/2015.  CT 03/29/2015. FINDINGS: Endotracheal tube, NG  tube, left IJ line stable position. Worsening bibasilar atelectasis and or infiltrates/edema. Small left pleural effusion. No pneumothorax. Stable cardiomegaly. Multiple left rib fractures again noted. IMPRESSION: 1. Lines and tubes in stable position. 2. Progressive bibasilar atelectasis and/or infiltrates/edema. Small left pleural effusion . 3. Stable cardiomegaly. Electronically Signed   By: Marcello Moores  Register   On: 11/20/2015 07:08   Dg Chest Port 1 View  11/18/2015  CLINICAL DATA:  69 year old male with acute respiratory failure. Status post cardiac arrest. Initial encounter. EXAM: PORTABLE CHEST 1 VIEW COMPARISON:  11/17/2015 and earlier. FINDINGS: Portable AP semi upright view at 0514 hours. Stable endotracheal tube. Stable visualized enteric tube coursing to the abdomen. Stable left IJ central line. Increased veiling opacity at the right lung base. Continued dense retrocardiac opacity. Continued left and new right perihilar interstitial opacity. No  pneumothorax. Small left side effusion suspected. IMPRESSION: 1.  Stable lines and tubes. 2. Interval increased bilateral perihilar opacity and right pleural effusion. Favor pulmonary interstitial edema over infection. 3. Continued dense lower lobe collapse/consolidation. Suspect stable small left effusion. Electronically Signed   By: Genevie Ann M.D.   On: 11/18/2015 07:12   Dg Chest Port 1 View  11/17/2015  CLINICAL DATA:  Acute respiratory failure, CHF, and coronary artery disease EXAM: PORTABLE CHEST 1 VIEW COMPARISON:  Portable chest x-ray of November 15, 2015 FINDINGS: The lungs are adequately inflated. There has been further development of alveolar opacity in the left mid and lower lung. The retrocardiac region is opacified and the left hemidiaphragm is largely obscured. There is persistent alveolar opacity in the right infrahilar region. The heart is mildly enlarged. The pulmonary vascularity is mildly engorged. The endotracheal tube tip projects 4.9 cm above the carina. The esophagogastric tube tip projects below the inferior margin of the image. The left internal jugular venous catheter tip projects over the midportion of the SVC. IMPRESSION: Slight interval worsening of alveolar opacities on the left compatible with pneumonia or less likely pulmonary edema. Stable subsegmental atelectasis or early infiltrate in the right infrahilar region. Low-grade CHF. Electronically Signed   By: David  Martinique M.D.   On: 11/17/2015 07:11   Dg Chest Port 1 View  11/15/2015  CLINICAL DATA:  Pulmonary edema.  Recent cardiac catheterization. EXAM: PORTABLE CHEST 1 VIEW COMPARISON:  11/14/2015 and 11/13/2015. FINDINGS: 0524 hours. Endotracheal tube is unchanged, approximately 3 cm above the carina. Left IJ central venous catheter and nasogastric tube are unchanged. The heart size and mediastinal contours are stable. Bibasilar airspace opacities have not significantly changed. There is no pneumothorax or significant pleural  effusion. IMPRESSION: Stable chest with bibasilar airspace opacities. Stable support system. Electronically Signed   By: Richardean Sale M.D.   On: 11/15/2015 09:14   Dg Chest Port 1 View  11/14/2015  CLINICAL DATA:  Hypoxia EXAM: PORTABLE CHEST 1 VIEW COMPARISON:  November 13, 2015 FINDINGS: Endotracheal tube tip is 3.1 cm above the carina. Nasogastric tube tip and side port below the diaphragm. Central catheter tip is in the superior vena cava. No pneumothorax. There is airspace consolidation in the left base. There is mild bibasilar interstitial edema. There is a small left pleural effusion. Heart is slightly enlarged with pulmonary vascularity within normal limits. No adenopathy. IMPRESSION: Tube and catheter positions as described without pneumothorax. Small left effusion with bibasilar edema. Suspect a degree of congestive heart failure. Consolidation in the left base may represent alveolar edema but does suggest potential underlying pneumonia. No new opacity appreciable compared to 1 day prior.  Electronically Signed   By: Lowella Grip III M.D.   On: 11/14/2015 07:21   Dg Chest Port 1 View  11/13/2015  CLINICAL DATA:  Cardiac arrest, acute respiratory failure, CHF. EXAM: PORTABLE CHEST 1 VIEW COMPARISON:  Portable chest x-ray of November 12, 2015 FINDINGS: The lungs are adequately inflated. The retrocardiac region remains dense on the left. The interstitial markings at the right lung base are slightly more conspicuous today. There is no pneumothorax nor large pleural effusion. The heart is top-normal in size. The pulmonary vascularity is not clearly engorged. The endotracheal tube tip lies approximately 2.5 cm above the carina. The esophagogastric tube tip projects below the inferior margin of the image. The left internal jugular venous catheter tip projects over the midportion of the SVC. External pacemaker defibrillator pads are present. IMPRESSION: Increased interstitial density in both lung bases  consistent with infiltrate or pulmonary edema. The upper lobe pulmonary vascularity is only minimally prominent. Stable support tubes. Electronically Signed   By: David  Martinique M.D.   On: 11/13/2015 07:14   Dg Chest Port 1 View  11/12/2015  CLINICAL DATA:  Central line placement EXAM: PORTABLE CHEST 1 VIEW COMPARISON:  11/11/2015 FINDINGS: The endotracheal tube is 4.7 cm above the carina. Nasogastric tube extends into the stomach. There is a new left jugular central line with tip in the low SVC. There is no pneumothorax. Ground-glass opacities are present in the central and basilar regions bilaterally, worsened. This could represent alveolar edema or infectious infiltrate. No large effusions. The left hemi thorax is superimposed by a percutaneous pacing patch. IMPRESSION: 1. New left jugular central line with tip in the low SVC. No pneumothorax. 2. Satisfactorily positioned endotracheal tube and nasogastric tube. 3. New/worsening central alveolar edema or infiltrate. Electronically Signed   By: Andreas Newport M.D.   On: 11/12/2015 06:00   Dg Chest Portable 1 View  11/11/2015  CLINICAL DATA:  Myocardial infarction.  Post resuscitation. EXAM: PORTABLE CHEST 1 VIEW COMPARISON:  03/29/2015 FINDINGS: Endotracheal tube tip is 2.6 cm above the carina. The nasogastric tube extends into the stomach. The lungs are clear except for mild atelectatic appearing linear basilar opacities on the left. No confluent airspace consolidation. No large effusion. No pneumothorax. Mild central vascular prominence, which can be physiologic in the supine position. IMPRESSION: Support equipment appears satisfactorily positioned. Electronically Signed   By: Andreas Newport M.D.   On: 11/11/2015 22:12   Dg Abd Portable 1v  11/18/2015  CLINICAL DATA:  Orogastric tube placement EXAM: PORTABLE ABDOMEN - 1 VIEW COMPARISON:  CT abdomen and pelvis March 29, 2015 FINDINGS: Orogastric tube tip and side port are in the stomach. There is  moderate stool throughout the colon. There is no bowel dilatation or air-fluid level suggesting obstruction. No free air is seen on this supine examination. IMPRESSION: Orogastric tube tip and side port in stomach. Bowel gas pattern unremarkable. Moderate stool throughout colon. Electronically Signed   By: Lowella Grip III M.D.   On: 11/18/2015 08:15    PHYSICAL EXAM General: NAD Neck: JVP 7 cm, no thyromegaly or thyroid nodule.  Lungs: Rhonchi bilaterally CV: Nondisplaced PMI.  Heart mildly tachy, regular S1/S2, no S3/S4, no murmur.  No peripheral edema.   Abdomen: Soft, nontender, no hepatosplenomegaly, no distention.  Neurologic: Alert and oriented x 3.  Psych: Normal affect. Extremities: No clubbing or cyanosis.   TELEMETRY: Reviewed telemetry pt in sinus tachy in 100s  ASSESSMENT AND PLAN: 69 yo with history of out-of-hospital ventricular fibrillation  arrest in setting of anterior STEMI now s/p LAD PCI.  He had prolonged intubation, now extubated with suspected HCAP.  1. Ventricular fibrillation arrest: In setting of anterior STEMI, s/p treatment.  Continue beta blocker.  2. CAD: Anterior STEMI, s/p DES to LAD.   - Continue ASA 81 and ticagrelor - Continue atorvastatin 80 daily.  3. Acute/chronic systolic CHF: EF initially 25% on LV-gram, improved on echo to 50-55% after intervention.  Volume status looks ok after getting IV Lasix yesterday..   - Continue ramipril 2.5 daily and Coreg 6.25 mg bid.  - Think we can hold off on Lasix today.  4. HCAP: Suspected.  On vancomycin and aztreonam. 5. Nutrition: Has NGT and getting TFs. Failed swallow study.  6. Laryngeal edema: Much improved with dexamethasone.   Loralie Champagne 11/23/2015 7:58 AM

## 2015-11-24 ENCOUNTER — Inpatient Hospital Stay (HOSPITAL_COMMUNITY): Payer: BLUE CROSS/BLUE SHIELD

## 2015-11-24 DIAGNOSIS — A0472 Enterocolitis due to Clostridium difficile, not specified as recurrent: Secondary | ICD-10-CM | POA: Insufficient documentation

## 2015-11-24 LAB — GLUCOSE, CAPILLARY
GLUCOSE-CAPILLARY: 226 mg/dL — AB (ref 65–99)
GLUCOSE-CAPILLARY: 190 mg/dL — AB (ref 65–99)
Glucose-Capillary: 163 mg/dL — ABNORMAL HIGH (ref 65–99)
Glucose-Capillary: 163 mg/dL — ABNORMAL HIGH (ref 65–99)
Glucose-Capillary: 155 mg/dL — ABNORMAL HIGH (ref 65–99)

## 2015-11-24 LAB — CBC WITH DIFFERENTIAL/PLATELET
Basophils Absolute: 0 10*3/uL (ref 0.0–0.1)
Basophils Relative: 0 %
EOS PCT: 0 %
Eosinophils Absolute: 0 10*3/uL (ref 0.0–0.7)
HEMATOCRIT: 46.8 % (ref 39.0–52.0)
Hemoglobin: 15.4 g/dL (ref 13.0–17.0)
LYMPHS ABS: 1.2 10*3/uL (ref 0.7–4.0)
Lymphocytes Relative: 4 %
MCH: 31.8 pg (ref 26.0–34.0)
MCHC: 32.9 g/dL (ref 30.0–36.0)
MCV: 96.5 fL (ref 78.0–100.0)
MONOS PCT: 7 %
Monocytes Absolute: 2.1 10*3/uL — ABNORMAL HIGH (ref 0.1–1.0)
Neutro Abs: 26.5 10*3/uL — ABNORMAL HIGH (ref 1.7–7.7)
Neutrophils Relative %: 89 %
Platelets: 385 10*3/uL (ref 150–400)
RBC: 4.85 MIL/uL (ref 4.22–5.81)
RDW: 15 % (ref 11.5–15.5)
WBC: 29.8 10*3/uL — AB (ref 4.0–10.5)

## 2015-11-24 LAB — RENAL FUNCTION PANEL
ALBUMIN: 2.6 g/dL — AB (ref 3.5–5.0)
ANION GAP: 8 (ref 5–15)
BUN: 40 mg/dL — ABNORMAL HIGH (ref 6–20)
CO2: 27 mmol/L (ref 22–32)
Calcium: 8.6 mg/dL — ABNORMAL LOW (ref 8.9–10.3)
Chloride: 113 mmol/L — ABNORMAL HIGH (ref 101–111)
Creatinine, Ser: 0.98 mg/dL (ref 0.61–1.24)
Glucose, Bld: 181 mg/dL — ABNORMAL HIGH (ref 65–99)
PHOSPHORUS: 3.4 mg/dL (ref 2.5–4.6)
POTASSIUM: 3.7 mmol/L (ref 3.5–5.1)
Sodium: 148 mmol/L — ABNORMAL HIGH (ref 135–145)

## 2015-11-24 LAB — TRIGLYCERIDES: TRIGLYCERIDES: 171 mg/dL — AB (ref <150)

## 2015-11-24 LAB — MAGNESIUM: Magnesium: 2.5 mg/dL — ABNORMAL HIGH (ref 1.7–2.4)

## 2015-11-24 LAB — C DIFFICILE QUICK SCREEN W PCR REFLEX
C DIFFICILE (CDIFF) INTERP: POSITIVE
C DIFFICLE (CDIFF) ANTIGEN: POSITIVE — AB
C Diff toxin: POSITIVE — AB

## 2015-11-24 MED ORDER — RESOURCE THICKENUP CLEAR PO POWD
ORAL | Status: DC | PRN
  Filled 2015-11-24: qty 125

## 2015-11-24 MED ORDER — ENOXAPARIN SODIUM 40 MG/0.4ML ~~LOC~~ SOLN
40.0000 mg | SUBCUTANEOUS | Status: DC
  Administered 2015-11-24 – 2015-12-04 (×11): 40 mg via SUBCUTANEOUS
  Filled 2015-11-24 (×11): qty 0.4

## 2015-11-24 MED ORDER — VANCOMYCIN 50 MG/ML ORAL SOLUTION
500.0000 mg | Freq: Four times a day (QID) | ORAL | Status: DC
  Administered 2015-11-24 – 2015-12-04 (×36): 500 mg via ORAL
  Filled 2015-11-24 (×46): qty 10

## 2015-11-24 MED ORDER — CARVEDILOL 6.25 MG PO TABS
6.2500 mg | ORAL_TABLET | Freq: Once | ORAL | Status: AC
  Administered 2015-11-24: 6.25 mg via ORAL
  Filled 2015-11-24: qty 1

## 2015-11-24 MED ORDER — CARVEDILOL 12.5 MG PO TABS
12.5000 mg | ORAL_TABLET | Freq: Two times a day (BID) | ORAL | Status: DC
  Administered 2015-11-24: 12.5 mg via ORAL
  Filled 2015-11-24: qty 1

## 2015-11-24 NOTE — Progress Notes (Signed)
PT is on [place] at 6L  Transitioned of HF [place] by RN

## 2015-11-24 NOTE — Progress Notes (Signed)
Physical Therapy Treatment Patient Details Name: [name] MRN: [id number] DOB: 11-Apr-1946 Today's Date: 11/24/2015    History of Present Illness 69 y.o. male w/ PMHx significant for rheumatoid arthritis and HTN who presented to [facility] on 11/11/2015 after an out-of-hospital cardiac arrest. The patient reportedly had chest and arm pain intermittently for 2 weeks after sustaining a mechanical fall. Today he had worsening of his chest pain and he ultimately arrested at home, witnessed by his wife. First responder delivered 2 shocks via an AED and the patient had ROSC with this. An EKG in the field was suggestive of acute anterior STEMI  Patient intubated 11/29-12/8.    PT Comments    Pt admitted with above diagnosis. Pt currently with functional limitations due to balance and endurance deficits. Pt able to stand with RW to be cleaned with mod to max assist and cues for upright stance.  Fatigues quickly and limited due to BM today.  Will continue as pt able.   Pt will benefit from skilled PT to increase their independence and safety with mobility to allow discharge to the venue listed below.    Follow Up Recommendations  CIR;Supervision/Assistance - 24 hour     Equipment Recommendations  Other (comment) (TBA)    Recommendations for Other Services       Precautions / Restrictions Precautions Precautions: Fall Restrictions Weight Bearing Restrictions: No    Mobility  Bed Mobility               General bed mobility comments: pt in chair on arrival  Transfers Overall transfer level: Needs assistance Equipment used: Rolling walker (2 wheeled) Transfers: Sit to/from Stand Sit to Stand: Mod assist;Max assist;+2 physical assistance         General transfer comment: Pt needed cues for hand placement.  Needed mod to max asssist to power up from recliner.  Nursing was in room and cleaning pt and pt had stood a few times prior to PT arrival with nurses help.   Obtained RW and pt stood this time so that they could finish cleaning pt and he stood with rW for 1 1/2 minutes to be cleaned.  Pt needed constant cues to maintain stance as he had difficulty keeping his knees extended keeping them slightly flexed which decr his stability.  Pt fatigued and at 1 1/2 min could not stand any longer needing controlled descent into chair.  Pt positioned in chair comfortably.    Ambulation/Gait             General Gait Details:  pt unable to ambulate at this time   Stairs            Wheelchair Mobility    Modified Rankin (Stroke Patients Only)       Balance Overall balance assessment: Needs assistance         Standing balance support: Bilateral upper extremity supported;During functional activity Standing balance-Leahy Scale: Poor Standing balance comment: RElies on UE support and cuing to maintain static standing.                      Cognition Arousal/Alertness: Awake/alert Behavior During Therapy: WFL for tasks assessed/performed Overall Cognitive Status: No family/caregiver present to determine baseline cognitive functioning Area of Impairment: Problem solving             Problem Solving: Decreased initiation;Difficulty sequencing;Requires verbal cues;Requires tactile cues;Slow processing General Comments: very slow to respond to mobility commands as if planning issue, but  pt also globally weak; perseverated on getting up to chair despite his obvious weakness, stool incontinence and needing extensive assist even for sitting balance    Exercises General Exercises - Lower Extremity Ankle Circles/Pumps: AROM;Both;10 reps;Seated Long Arc Quad: AROM;Both;10 reps;Seated Hip Flexion/Marching: AROM;Both;10 reps;Seated    General Comments General comments (skin integrity, edema, etc.): pt with large BM just prior to PT arrival.  PT assisted pt to stand for linen change and cleaning of pt.       Pertinent Vitals/Pain Pain  Assessment: No/denies pain  VSS    Home Living                      Prior Function            PT Goals (current goals can now be found in the care plan section) Progress towards PT goals: Not progressing toward goals - comment (fatigued due to large BM and from standing for cleaning)    Frequency  Min 3X/week    PT Plan Current plan remains appropriate    Co-evaluation             End of Session Equipment Utilized During Treatment: Gait belt Activity Tolerance: Patient limited by fatigue Patient left: in chair;with call bell/phone within reach;with nursing/sitter in room     Time: [id number] PT Time Calculation (min) (ACUTE ONLY): 17 min  Charges:  $Self Care/Home Management: August 15, 2023                    G CodesIrwin Brakeman F 2015-12-05, 1:41 PM Jaid Quirion,PT Acute Rehabilitation [phone] (pager)

## 2015-11-24 NOTE — Progress Notes (Signed)
Speech Language Pathology Treatment: Dysphagia  Patient Details Name: [name] MRN: [id number] DOB: [birth year] Today's Date: 11/24/2015 Time: 0920-0940 SLP Time Calculation (min) (ACUTE ONLY): 20 min  Assessment / Plan / Recommendation Clinical Impression  Pt demonstrates signs of improvement in airway protection subjectively at bedside with strong cough, independent management of secretions, stable respiratory function and mild dysphonia. With trials of puree and small sips of nectar there are no signs of aspiration, though large sips of nectar and sips of water still result in immediate coughing indicative of sensed penetration/aspiration. Will proceed with diet initiation (dys 3 /mechanical soft diet and honey thick liquids) with f/u for possible upgrade/MBS depending on pt tolerance and improvement. Explained rationale to pt and discussed with RN. Suggest removal of NG tube for improved swallow function and pt comfort.    HPI HPI: 69 year old male with PMH of RA and HTN. He [facility] ED 11/29 after a witnessed cardiac arrest at home. Family initiated CPR. EMS arrived and AED was placed. Two shocks were delivered until ROSC was achieved. Total down time estimated at less than 15 minutes. Pt was intubated 11/29-12/8.      SLP Plan  Continue with current plan of care     Recommendations  Diet recommendations: Honey-thick liquid;Dysphagia 3 (mechanical soft) Liquids provided via: Cup Medication Administration: Whole meds with puree Supervision: Patient able to self feed;Intermittent supervision to cue for compensatory strategies Compensations: Clear throat intermittently;Slow rate;Small sips/bites;Minimize environmental distractions Postural Changes and/or Swallow Maneuvers: Seated upright 90 degrees              Oral Care Recommendations: Oral care BID Plan: Continue with current plan of care  [facility], MA CCC-SLP [phone]  Lynann Beaver 11/24/2015, 9:45 AM

## 2015-11-24 NOTE — Progress Notes (Signed)
MD informed of pt having frequent stool (loose stool), order for C. Diff received and sent. Pt has had frequent  stools today with buttock red and excoriated. Attempt to put flexiseal futile, pt refused. Barrier cream applied after each stool. Sacral mepilex off at this time due to frequent stools.  Pt's wife informed of Enteric precaution and information given on C. Diff toxin. Wife educated on importance of handwashing  with soap and water.  K.Thompson notified re Positive C. Diff antigen. New orders received. Will cont to [facility].

## 2015-11-24 NOTE — Progress Notes (Signed)
PULMONARY / CRITICAL CARE MEDICINE   Name: [name] MRN: [id number] DOB: July 13, 1946    ADMISSION DATE:  11/11/2015 CONSULTATION DATE:  11/11/2015  REFERRING MD:  Dr. Burt Knack  CHIEF COMPLAINT:  Cardiac arrest  HISTORY OF PRESENT ILLNESS:  69 year old male with PMH of RA and HTN. He presented to [facility] ED 11/29 after a witnessed cardiac arrest at home. He has had intermittent chest and arm pain for about 2 weeks status post mechanical fall. 11/29 chest pain acutely worsened and he unfortunately suffered a cardiac arrest, which was witnessed by his wife and EMS was called. Family initiated CPR. EMS arrived and AED was placed advising shock. 2 shocks were delivered and ROSC was achieved. 2 rounds epinephrine also administerred. EKG in the field was concerning for STEMI and code STEMI was called. In ED patient was unresponsive, he was intubated and taken emergently to cardiac cath lab where he remains at this time. Occlusive LAD lesion identified and intervened upon. PCCM consulted for ICU care. Total downtime estimated at less than 15 mins.   SUBJECTIVE:   Wants to get OOB  VITAL SIGNS: BP 121/73 mmHg  Pulse 116  Temp(Src) 98.6 F (37 C) (Oral)  Resp 23  Ht 5\' 7"  (1.702 m)  Wt 79.1 kg (174 lb 6.1 oz)  BMI 27.31 kg/m2  SpO2 100% 40% high flow  HEMODYNAMICS:    Vent Mode:  [-]  FiO2 (%):  [40 %] 40 %  INTAKE / OUTPUT: I/O last 3 completed shifts: In: 5470.8 [I.V.:345.8; NG/GT:4475; IV [id number] Out: 3000 [Urine:3000]  PHYSICAL EXAMINATION: General: Awake, extubated. Following all commands. Neuro: No gross focal deficits; whispers. On O2 [place].  HEENT: Moist mucous membranes, marked upper airway noises have decreased.  Cardiovascular: S1-S2, regular rate and rhythm, no murmurs rubs gallops  Lungs:scattered rhonchi improve w/ cough. No accessory muscle use Abdomen: Soft, positive bowel sounds  Skin: Intact.  -edema and -tenderness.  LABS:  CBC  Recent  Labs Lab 11/22/15 0431 11/23/15 0331 11/24/15 0319  WBC 21.1* 26.1* 29.8*  HGB 13.0 12.3* 15.4  HCT 39.5 38.7* 46.8  PLT 264 319 385   Coag's No results for input(s): APTT, INR in the last 168 hours. BMET  Recent Labs Lab 11/22/15 0431 11/23/15 0331 11/24/15 0319  NA 148* 147* 148*  K 3.6 3.7 3.7  CL 117* 111 113*  CO2 25 28 27   BUN 39* 41* 40*  CREATININE 0.94 1.00 0.98  GLUCOSE 196* 190* 181*   Electrolytes  Recent Labs Lab 11/22/15 0431 11/23/15 0331 11/24/15 0319  CALCIUM 8.5* 8.6* 8.6*  MG 2.5* 2.6* 2.5*  PHOS 2.2* 2.7 3.4   Sepsis Markers  Recent Labs Lab 11/20/15 1059 11/21/15 0438 11/22/15 0431  PROCALCITON 0.45 0.32 0.24   ABG  Recent Labs Lab 11/18/15 1150  PHART 7.470*  PCO2ART 36.4  PO2ART 77.3*   Liver Enzymes  Recent Labs Lab 11/22/15 0431 11/23/15 0331 11/24/15 0319  ALBUMIN 2.5* 2.5* 2.6*   Cardiac Enzymes No results for input(s): TROPONINI, PROBNP in the last 168 hours. Glucose  Recent Labs Lab 11/23/15 1141 11/23/15 1615 11/23/15 1924 11/23/15 2336 11/24/15 0342 11/24/15 0836  GLUCAP 278* 180* 233* 197* 163* 226*   Imaging Dg Chest Port 1 View  11/24/2015  CLINICAL DATA:  Pneumonia. EXAM: PORTABLE CHEST 1 VIEW COMPARISON:  11/22/2015. FINDINGS: Feeding tube noted coiled in stomach. Mediastinum hilar structures normal. Interim improvement of aeration. Persistent mild bibasilar subsegmental atelectasis. No pleural effusion or pneumothorax.  Heart size stable. No acute bony abnormality. IMPRESSION: Interim improvement of aeration with persistent mild bibasilar subsegmental atelectasis. Electronically Signed   By: Marcello Moores  Register   On: 11/24/2015 07:10   Basilar L>R atx  STUDIES:  2023-12-03  LHC - stent lad 2023-12-03  CT head - no acute abnormality 11/30  eeg - no seizure activity. suppression 12/03  Port CXR 12/3 - Bilat lower lung opacity.  CULTURES:  ANTIBIOTICS: Levaquin 11/30 >> 12/4 Vanco 12/9 >> Aztreonam  12/9 >>  SIGNIFICANT EVENTS: 2023-12-03 cardiac arrest > cath lab, out to ICU, therapeutic hypothermia.  12/02 completed rewarming 12/9 Extubated 12/10 getting OOB. Still weak, kept in ICU for intensive pulmonary toilet. Added decadron for Laryngeal edema.  12/11 remarkably better. Phonation improved. Getting OOB.   LINES/TUBES: ETT 12/03/23 >> 12/8 CVL 12-03-2023 >> 12/9 Left radial 03-Dec-2023 >> 12/3  I reviewed CXR myself, atelectasis noted on the left.   ASSESSMENT / PLAN:  PULMONARY A: Acute Hypoxic Respiratory Failure Severe CAP vs Aspiration Pneumonia  HCAP Probable post-intubation Laryngeal edema  High flow O2.  P: D/C high flow O2.  Cont nasal hygiene. Get OOB and PT.  CARDIOVASCULAR A:  Cardiac arrest - VT/VF STEMI s/p PCI 2023-12-03 ST HTN ICM w ef 25%-->now 50-55% after intervention  P:  Care per Cardiology-->BB increased Asa, Brilinta, Lopressor & Lipitor  RENAL A:   Hypernatremia   P:   Monitor UOP / BMP Free water replacement   GASTROINTESTINAL A:   Transaminitis - Shock liver. Resolving. At Risk Protein Calorie Malnutrition  Post intubation dysphagia  P:   Swallow eval pending Pepcid IV q12hr NPO until passes swallow eval  HEMATOLOGIC A:   Leukocytosis - Improving. Suspect stress response & exacerbated by steroids Anemia  P:  Follow CBC Antiplatelet / anticoagulation per Cardiology Heparin Gutierrez q8hr for DVT prophylaxis   INFECTIOUS A:   Possible CAP HCAP Diarrhea  P:   Broad-spectrum antibiotics for HCAP started 12/9 recommend a total of 8 days Narrow abx as cultures specify  Monitor fever curve / WBC Check C. Diff.  ENDOCRINE A:   Hyperglycemia - No H/O DM.  P:   Accu-Checks q4hr-->change to AC/HS when taking POs SSI coverage  AUTOIMMUNE:  A:  Rheumatoid Arthritis - on DMARD & Methotrexate at baseline   P:  Hold home medications  NEUROLOGIC A:   ICU delirium-->much better Deconditioning s/p critical illness  P:   Cont  Trazodone (home med). OOB Physical therapy evaluation ordered  FAMILY - Updates: Patient bedside.  TODAY'S SUMMARY:   Improving, start diet per speech recommendations, abx recommendations as above, may switch to PO augmentin if ready for discharge, may transfer to SDU, c. Diff pending, PCCM will sign off, please call back if needed.  Discussed with bedside RN and RT.  Rush Farmer, M.D. [facility] Pulmonary/Critical Care Medicine. Pager: [phone]. After hours pager: [phone].

## 2015-11-24 NOTE — Care Management Important Message (Signed)
Important Message  Patient Details  Name: [name] MRN: [id number] Date of Birth: 03-13-1946   Medicare Important Message Given:  Yes    Greggory Safranek P Eesa Justiss 11/24/2015, 1:54 PM

## 2015-11-24 NOTE — Progress Notes (Signed)
Subjective:    Day of hospitalization: 74   69 Hunter admitted for out of hospital Vfib arrest on 11/29.  EKG with acute anterior STEMI taken to cath lab with total occlusion of proximal LAD treated with DES.  Had difficult extubation complicated by laryngeal edema treated with dexamethasone and HCAP on abx.   Denies chest pain today.    Objective:   Temp:  [97.6 F (36.4 C)-99 F (37.2 C)] 98.5 F (36.9 C) (12/12 0400) Pulse Rate:  [89-111] 111 (12/12 0527) Resp:  [15-34] 26 (12/12 0527) BP: (117-136)/(62-75) 118/69 mmHg (12/12 0400) SpO2:  [91 %-100 %] 100 % (12/12 0527) FiO2 (%):  [40 %] 40 % (12/12 0326) Weight:  [79.1 kg (174 lb 6.1 oz)] 79.1 kg (174 lb 6.1 oz) (12/12 0242) Last BM Date: 11/22/15  Filed Weights   11/22/15 0500 11/23/15 0353 11/24/15 0242  Weight: 82.6 kg (182 lb 1.6 oz) 80.6 kg (177 lb 11.1 oz) 79.1 kg (174 lb 6.1 oz)    Intake/Output Summary (Last 24 hours) at 11/24/15 0607 Last data filed at 11/24/15 0536  Gross per 24 hour  Intake   3550 ml  Output   1700 ml  Net   1850 ml    Physical Exam: General: NAD. HEENT: EOMI.  Lungs: CTAB, nonlabored, on [place].  Cardiac: RRR, no m/r/g. Abdomen: +BS, NT/ND.   Extremities: No LE edema.  Neuro: Alert and oriented x3.   Lab Results:  Basic Metabolic Panel:  Recent Labs Lab 11/22/15 0431 11/23/15 0331 11/24/15 0319  NA 148* 147* 148*  K 3.6 3.7 3.7  CL 117* 111 113*  CO2 25 28 27   GLUCOSE 196* 190* 181*  BUN 39* 41* 40*  CREATININE 0.94 1.00 0.98  CALCIUM 8.5* 8.6* 8.6*  MG 2.5* 2.6* 2.5*    Liver Function Tests:  Recent Labs Lab 11/22/15 0431 11/23/15 0331 11/24/15 0319  ALBUMIN 2.5* 2.5* 2.6*    CBC:  Recent Labs Lab 11/22/15 0431 11/23/15 0331 11/24/15 0319  WBC 21.1* 26.1* 29.8*  HGB 13.0 12.3* 15.4  HCT 39.5 38.7* 46.8  MCV 95.9 96.3 96.5  PLT 264 319 385    Cardiac Enzymes: No results for input(s): CKTOTAL, CKMB, CKMBINDEX, TROPONINI in the last 168  hours.  BNP: No results for input(s): PROBNP in the last 8760 hours.  Coagulation: No results for input(s): INR in the last 168 hours.  Radiology: No results found.  Cardiac studies:   ECG:   Medications:   Scheduled Medications: . antiseptic oral rinse  7 mL Mouth Rinse q12n4p  . aspirin  81 mg Oral Daily  . atorvastatin  80 mg Oral q1800  . aztreonam  1 g Intravenous 3 times per day  . carvedilol  6.25 mg Oral BID WC  . chlorhexidine  15 mL Mouth Rinse BID  . famotidine  20 mg Oral BID  . fluticasone  2 spray Each Nare Daily  . free water  200 mL Per Tube 6 times per day  . guaiFENesin  15 mL Per Tube 6 times per day  . heparin  5,000 Units Subcutaneous 3 times per day  . insulin aspart  0-20 Units Subcutaneous 6 times per day  . insulin aspart  3 Units Subcutaneous 6 times per day  . oxymetazoline  1 spray Each Nare BID  . ramipril  2.5 mg Oral Daily  . sodium chloride  3 mL Intravenous Q12H  . tamsulosin  0.4 mg Oral Daily  .  ticagrelor  90 mg Oral BID  . traZODone  100 mg Oral QHS     Infusions: . sodium chloride 10 mL/hr at 11/23/15 0800  . feeding supplement (VITAL AF 1.2 CAL) 1,000 mL (11/23/15 1549)     PRN Medications:  sodium chloride, acetaminophen, ALPRAZolam, haloperidol lactate, ondansetron (ZOFRAN) IV, sennosides, sodium chloride, sodium chloride, sodium chloride   Assessment and Plan:   73 YOM admitted with out of hospital Vfib arrest in setting of anterior STEMI now s/p LAD PCI. He had prolonged intubation, now extubated with suspected HCAP.    Ventricular fibrillation arrest In setting of anterior STEMI, s/p DES to LAD -cont BB   STEMI S/p DES to LAD.  -cont ASA 81 and brilinta  -cont atorvastatin 80mg  qd  Acute/chronic systolic CHF EF initially 25% on LV-gram, improved to 50-55% after intervention.  -cont ramipril 2.5 daily and Coreg 6.25 mg bid  HCAP -per PCCM, on vancomycin and aztreonam  Nutrition Failed swallow  eval, has NGT and getting TF.    Laryngeal edema S/p extubation on 12/9.  Much improved with dexamethasone.    Jones Bales, MD PGY-3, Internal Medicine Teaching Service 11/24/2015, 6:07 AM    I have examined the patient and reviewed assessment and plan and discussed with patient.  Agree with above as stated.  Plan to increase Coreg to 12.5 BID.  Swallow study today.  Check stool for C. Diff.  Fairly stable from cardiac standpoint but he is very weak.  He will need PT eval.   Cuong Moorman S.

## 2015-11-25 ENCOUNTER — Inpatient Hospital Stay (HOSPITAL_COMMUNITY): Payer: BLUE CROSS/BLUE SHIELD

## 2015-11-25 DIAGNOSIS — Z789 Other specified health status: Secondary | ICD-10-CM | POA: Insufficient documentation

## 2015-11-25 DIAGNOSIS — Z87898 Personal history of other specified conditions: Secondary | ICD-10-CM

## 2015-11-25 DIAGNOSIS — A09 Infectious gastroenteritis and colitis, unspecified: Secondary | ICD-10-CM

## 2015-11-25 LAB — CBC
HEMATOCRIT: 49.1 % (ref 39.0–52.0)
Hemoglobin: 16.1 g/dL (ref 13.0–17.0)
MCH: 32 pg (ref 26.0–34.0)
MCHC: 32.8 g/dL (ref 30.0–36.0)
MCV: 97.6 fL (ref 78.0–100.0)
PLATELETS: 435 10*3/uL — AB (ref 150–400)
RBC: 5.03 MIL/uL (ref 4.22–5.81)
RDW: 15.8 % — ABNORMAL HIGH (ref 11.5–15.5)
WBC: 29.7 10*3/uL — ABNORMAL HIGH (ref 4.0–10.5)

## 2015-11-25 LAB — BASIC METABOLIC PANEL
Anion gap: 13 (ref 5–15)
BUN: 72 mg/dL — ABNORMAL HIGH (ref 6–20)
CHLORIDE: 116 mmol/L — AB (ref 101–111)
CO2: 20 mmol/L — AB (ref 22–32)
CREATININE: 1.7 mg/dL — AB (ref 0.61–1.24)
Calcium: 8.3 mg/dL — ABNORMAL LOW (ref 8.9–10.3)
GFR calc non Af Amer: 40 mL/min — ABNORMAL LOW (ref >60)
GFR, EST AFRICAN AMERICAN: 46 mL/min — AB (ref >60)
Glucose, Bld: 130 mg/dL — ABNORMAL HIGH (ref 65–99)
Potassium: 4.1 mmol/L (ref 3.5–5.1)
Sodium: 149 mmol/L — ABNORMAL HIGH (ref 135–145)

## 2015-11-25 LAB — POCT I-STAT 3, ART BLOOD GAS (G3+)
ACID-BASE DEFICIT: 1 mmol/L (ref 0.0–2.0)
Acid-base deficit: 3 mmol/L — ABNORMAL HIGH (ref 0.0–2.0)
BICARBONATE: 20.9 meq/L (ref 20.0–24.0)
Bicarbonate: 19.5 mEq/L — ABNORMAL LOW (ref 20.0–24.0)
O2 SAT: 91 %
O2 SAT: 96 %
PCO2 ART: 25.4 mmHg — AB (ref 35.0–45.0)
PH ART: 7.493 — AB (ref 7.350–7.450)
PO2 ART: 53 mmHg — AB (ref 80.0–100.0)
PO2 ART: 72 mmHg — AB (ref 80.0–100.0)
Patient temperature: 98.6
TCO2: 20 mmol/L (ref 0–100)
TCO2: 22 mmol/L (ref 0–100)
pCO2 arterial: 25.8 mmHg — ABNORMAL LOW (ref 35.0–45.0)
pH, Arterial: 7.515 — ABNORMAL HIGH (ref 7.350–7.450)

## 2015-11-25 LAB — GLUCOSE, CAPILLARY
GLUCOSE-CAPILLARY: 159 mg/dL — AB (ref 65–99)
GLUCOSE-CAPILLARY: 162 mg/dL — AB (ref 65–99)
GLUCOSE-CAPILLARY: 144 mg/dL — AB (ref 65–99)
Glucose-Capillary: 128 mg/dL — ABNORMAL HIGH (ref 65–99)
Glucose-Capillary: 132 mg/dL — ABNORMAL HIGH (ref 65–99)
Glucose-Capillary: 142 mg/dL — ABNORMAL HIGH (ref 65–99)

## 2015-11-25 MED ORDER — FENTANYL CITRATE (PF) 100 MCG/2ML IJ SOLN
50.0000 ug | INTRAMUSCULAR | Status: DC | PRN
  Administered 2015-11-25 – 2015-12-03 (×8): 100 ug via INTRAVENOUS
  Filled 2015-11-25 (×4): qty 2
  Filled 2015-11-25: qty 4
  Filled 2015-11-25 (×3): qty 2

## 2015-11-25 MED ORDER — FENTANYL CITRATE (PF) 100 MCG/2ML IJ SOLN: 50.0000 ug | INTRAMUSCULAR | Status: DC | PRN

## 2015-11-25 MED ORDER — MIDAZOLAM HCL 2 MG/2ML IJ SOLN
1.0000 mg | INTRAMUSCULAR | Status: DC | PRN
  Administered 2015-11-25: 1 mg via INTRAVENOUS
  Filled 2015-11-25: qty 2

## 2015-11-25 MED ORDER — VITAL AF 1.2 CAL PO LIQD
1000.0000 mL | ORAL | Status: DC
  Administered 2015-11-25 – 2015-11-27 (×4): 1000 mL
  Filled 2015-11-25 (×6): qty 1000

## 2015-11-25 MED ORDER — PHENYLEPHRINE HCL 10 MG/ML IJ SOLN
30.0000 ug/min | INTRAMUSCULAR | Status: DC
  Administered 2015-11-25: 70 ug/min via INTRAVENOUS
  Administered 2015-11-25: 30 ug/min via INTRAVENOUS
  Filled 2015-11-25 (×3): qty 1

## 2015-11-25 MED ORDER — SODIUM CHLORIDE 0.9 % IV BOLUS (SEPSIS)
500.0000 mL | Freq: Once | INTRAVENOUS | Status: AC
  Administered 2015-11-25: 500 mL via INTRAVENOUS

## 2015-11-25 MED ORDER — INSULIN ASPART 100 UNIT/ML ~~LOC~~ SOLN
2.0000 [IU] | SUBCUTANEOUS | Status: DC
  Administered 2015-11-25: 4 [IU] via SUBCUTANEOUS

## 2015-11-25 MED ORDER — MIDAZOLAM HCL 2 MG/2ML IJ SOLN
1.0000 mg | INTRAMUSCULAR | Status: DC | PRN
  Administered 2015-11-25 (×6): 2 mg via INTRAVENOUS
  Administered 2015-11-25: 4 mg via INTRAVENOUS
  Administered 2015-11-25: 2 mg via INTRAVENOUS
  Administered 2015-11-26 (×2): 4 mg via INTRAVENOUS
  Administered 2015-11-26 (×2): 2 mg via INTRAVENOUS
  Administered 2015-11-26 (×3): 4 mg via INTRAVENOUS
  Filled 2015-11-25 (×2): qty 4
  Filled 2015-11-25: qty 2
  Filled 2015-11-25 (×2): qty 4
  Filled 2015-11-25 (×2): qty 2
  Filled 2015-11-25: qty 4
  Filled 2015-11-25 (×4): qty 2
  Filled 2015-11-25 (×2): qty 4

## 2015-11-25 MED ORDER — SODIUM CHLORIDE 0.9 % IV SOLN
25.0000 ug/h | INTRAVENOUS | Status: DC
  Administered 2015-11-25: 75 ug/h via INTRAVENOUS
  Administered 2015-11-26 (×2): 200 ug/h via INTRAVENOUS
  Administered 2015-11-26 – 2015-11-27 (×2): 250 ug/h via INTRAVENOUS
  Filled 2015-11-25 (×4): qty 50

## 2015-11-25 MED ORDER — ANTISEPTIC ORAL RINSE SOLUTION (CORINZ)
7.0000 mL | Freq: Four times a day (QID) | OROMUCOSAL | Status: DC
  Administered 2015-11-25 – 2015-11-27 (×9): 7 mL via OROMUCOSAL

## 2015-11-25 MED ORDER — MIDAZOLAM HCL 2 MG/2ML IJ SOLN
INTRAMUSCULAR | Status: AC
  Administered 2015-11-25: 1 mg via INTRAVENOUS
  Filled 2015-11-25: qty 2

## 2015-11-25 MED ORDER — FENTANYL CITRATE (PF) 100 MCG/2ML IJ SOLN
INTRAMUSCULAR | Status: AC
  Filled 2015-11-25: qty 2

## 2015-11-25 MED ORDER — ETOMIDATE 2 MG/ML IV SOLN
20.0000 mg | Freq: Once | INTRAVENOUS | Status: AC
  Administered 2015-11-25: 20 mg via INTRAVENOUS

## 2015-11-25 MED ORDER — PHENYLEPHRINE HCL 10 MG/ML IJ SOLN
30.0000 ug/min | INTRAVENOUS | Status: DC
  Administered 2015-11-25 – 2015-11-26 (×3): 100 ug/min via INTRAVENOUS
  Administered 2015-11-26: 95 ug/min via INTRAVENOUS
  Administered 2015-11-26 (×2): 100 ug/min via INTRAVENOUS
  Administered 2015-11-27: 20 ug/min via INTRAVENOUS
  Filled 2015-11-25 (×9): qty 4

## 2015-11-25 MED ORDER — FENTANYL CITRATE (PF) 100 MCG/2ML IJ SOLN
50.0000 ug | INTRAMUSCULAR | Status: AC | PRN
  Administered 2015-11-25 (×2): 50 ug via INTRAVENOUS
  Filled 2015-11-25 (×2): qty 2

## 2015-11-25 MED ORDER — FENTANYL CITRATE (PF) 100 MCG/2ML IJ SOLN
100.0000 ug | Freq: Once | INTRAMUSCULAR | Status: AC
  Administered 2015-11-25: 100 ug via INTRAVENOUS

## 2015-11-25 MED ORDER — CHLORHEXIDINE GLUCONATE 0.12% ORAL RINSE (MEDLINE KIT)
15.0000 mL | Freq: Two times a day (BID) | OROMUCOSAL | Status: DC
  Administered 2015-11-25 – 2015-11-27 (×5): 15 mL via OROMUCOSAL

## 2015-11-25 MED ORDER — MIDAZOLAM HCL 2 MG/2ML IJ SOLN
1.0000 mg | INTRAMUSCULAR | Status: DC | PRN
  Administered 2015-11-25: 1 mg via INTRAVENOUS

## 2015-11-25 MED ORDER — INSULIN ASPART 100 UNIT/ML ~~LOC~~ SOLN
2.0000 [IU] | SUBCUTANEOUS | Status: DC
Start: 2015-11-25 — End: 2015-11-26
  Administered 2015-11-25: 2 [IU] via SUBCUTANEOUS
  Administered 2015-11-26: 4 [IU] via SUBCUTANEOUS
  Administered 2015-11-26 (×2): 6 [IU] via SUBCUTANEOUS

## 2015-11-25 NOTE — Progress Notes (Signed)
Door Progress Note Patient Name: KEYSHAWN SNIDE DOB: 15-Sep-1946 MRN: [id number]   Date of Service  11/25/2015  HPI/Events of Note  Inc fent prn is making him calmer on vent but dropping bp. SBP 70s.  Most recent ef 55% cxr now clear  eICU Interventions  500cc saline bolus MAP goal > 65 Neo if needed        Siani Utke 11/25/2015, 4:40 AM

## 2015-11-25 NOTE — Progress Notes (Signed)
Attempted to call patient's wife regarding the change in his condition. No answer, message left to call back ASAP

## 2015-11-25 NOTE — Progress Notes (Signed)
PT Cancellation Note and D/C  Patient Details Name: [name] MRN: [id number] DOB: 1946/07/10   Cancelled Treatment:    Reason Eval/Treat Not Completed: Medical issues which prohibited therapy (Pt intubated overnight.  Will sign off per nurse. Please reorder when appropriate.  Thanks.)   Irwin Brakeman F 11/25/2015, 11:14 AM [facility] [phone] (pager)

## 2015-11-25 NOTE — Progress Notes (Signed)
Speech Language Pathology Discharge Patient Details Name: [name] MRN: [id number] DOB: 1946-10-18 Today's Date: 11/25/2015 Time:  -     Patient discharged from SLP services secondary to medical decline - will need to re-order SLP to resume therapy services. Decline overnight. Discharge, please reorder if and when appropriate.  Please see latest therapy progress note for current level of functioning and progress toward goals.    Progress and discharge plan discussed with patient and/or caregiver: Patient unable to participate in discharge planning and no caregivers available  GO     Mick Sell, Tami Ribas C[place].Ed CCC-SLP Pager [phone]   11/25/2015, 9:11 AM

## 2015-11-25 NOTE — Progress Notes (Signed)
PULMONARY / CRITICAL CARE MEDICINE   Name: [name] MRN: [id number] DOB: November 16, 1946    ADMISSION DATE:  11/11/2015 CONSULTATION DATE:  11/11/2015  REFERRING MD:  Dr. Burt Knack  CHIEF COMPLAINT:  Cardiac arrest  HISTORY OF PRESENT ILLNESS:  69 year old male with PMH of RA and HTN. He presented to [facility] ED 11/29 after a witnessed cardiac arrest at home. He has had intermittent chest and arm pain for about 2 weeks status post mechanical fall. 11/29 chest pain acutely worsened and he unfortunately suffered a cardiac arrest, which was witnessed by his wife and EMS was called. Family initiated CPR. EMS arrived and AED was placed advising shock. 2 shocks were delivered and ROSC was achieved. 2 rounds epinephrine also administerred. EKG in the field was concerning for STEMI and code STEMI was called. In ED patient was unresponsive, he was intubated and taken emergently to cardiac cath lab where he remains at this time. Occlusive LAD lesion identified and intervened upon. PCCM consulted for ICU care. Total downtime estimated at less than 15 mins.   SUBJECTIVE / Interval events: Increased WOB through the day today. Has had continual diarrhea, volume unclear but changing about every hour. More fatigued and now lethargic after pm trazodone given. ABG with hypoxemia and respiratory alkalosis   VITAL SIGNS: BP 86/56 mmHg  Pulse 118  Temp(Src) 98.9 F (37.2 C) (Oral)  Resp 33  Ht 5\' 7"  (1.702 m)  Wt 79.8 kg (175 lb 14.8 oz)  BMI 27.55 kg/m2  SpO2 100% 40% high flow  HEMODYNAMICS:    Vent Mode:  [-] PRVC FiO2 (%):  [4 %-70 %] 70 % Set Rate:  [12 bmp] 12 bmp Vt Set:  [550 mL] 550 mL PEEP:  [5 cmH20] 5 cmH20  INTAKE / OUTPUT: I/O last 3 completed shifts: In: 2883 [P.O.:25; I.V.:418; NG/GT:1690; IV Piggyback:750] Out: 1860 [id number]  PHYSICAL EXAMINATION: General: Ill appearing, sedated and intubated but withdraws all ext to pain. Neuro: Withdraws to pain but not  interactive, on sedation. HEENT: Moist mucous membranes, [place]/AT, PERRL, EOM-I. Cardiovascular: S1-S2, regular rate and rhythm, no murmurs rubs gallops  Lungs: Coarse BS diffusely. Abdomen: Somewhat distended, positive bowel sounds  Skin: Intact.  -edema   LABS:  CBC  Recent Labs Lab 11/23/15 0331 11/24/15 0319 11/25/15 0258  WBC 26.1* 29.8* 29.7*  HGB 12.3* 15.4 16.1  HCT 38.7* 46.8 49.1  PLT 319 385 435*   Coag's No results for input(s): APTT, INR in the last 168 hours. BMET  Recent Labs Lab 11/23/15 0331 11/24/15 0319 11/25/15 0258  NA 147* 148* 149*  K 3.7 3.7 4.1  CL 111 113* 116*  CO2 28 27 20*  BUN 41* 40* 72*  CREATININE 1.00 0.98 1.70*  GLUCOSE 190* 181* 130*   Electrolytes  Recent Labs Lab 11/22/15 0431 11/23/15 0331 11/24/15 0319 11/25/15 0258  CALCIUM 8.5* 8.6* 8.6* 8.3*  MG 2.5* 2.6* 2.5*  --   PHOS 2.2* 2.7 3.4  --    Sepsis Markers  Recent Labs Lab 11/20/15 1059 11/21/15 0438 11/22/15 0431  PROCALCITON 0.45 0.32 0.24   ABG  Recent Labs Lab 11/18/15 1150 11/25/15 0014 11/25/15 0533  PHART 7.470* 7.515* 7.493*  PCO2ART 36.4 25.8* 25.4*  PO2ART 77.3* 53.0* 72.0*   Liver Enzymes  Recent Labs Lab 11/22/15 0431 11/23/15 0331 11/24/15 0319  ALBUMIN 2.5* 2.5* 2.6*   Cardiac Enzymes No results for input(s): TROPONINI, PROBNP in the last 168 hours. Glucose  Recent Labs Lab 11/24/15  [id number] 11/24/15 1150 11/24/15 1637 11/24/15 2029 11/25/15 11/25/15 0335  GLUCAP 226* 190* 163* 155* 132* 128*   Imaging Dg Chest Port 1 View  11/25/2015  CLINICAL DATA:  Acute respiratory failure.  Shortness of breath. EXAM: PORTABLE CHEST 1 VIEW COMPARISON:  11/25/2015 FINDINGS: Interval placement of an endotracheal tube with tip measuring 3.6 cm above the carina. An enteric tube is been placed with tip off the field of view but below the left hemidiaphragm. Normal heart size and pulmonary vascularity. Increasing atelectasis in the left lung  base. Persistent linear atelectasis in both mid lungs. No pneumothorax. No blunting of costophrenic angles. Mediastinal contours appear intact. IMPRESSION: Appliances appear in satisfactory location. Increasing atelectasis in the left lung base with persistent linear atelectasis in both mid lungs. Electronically Signed   By: Lucienne Capers M.D.   On: 11/25/2015 01:18   Dg Chest Portable 1 View  11/25/2015  CLINICAL DATA:  Shortness of breath.  Oxygen desaturation. EXAM: PORTABLE CHEST 1 VIEW COMPARISON:  11/24/2015 FINDINGS: Shallow inspiration. Increasing atelectasis in the left lung base with persistent atelectasis in the bilateral mid lung. Heart size is normal. Mediastinal contours appear intact. No pneumothorax. No blunting of costophrenic angles. IMPRESSION: Increasing atelectasis in the left lung base with persistent linear atelectasis in the mid lungs bilaterally. Electronically Signed   By: Lucienne Capers M.D.   On: 11/25/2015 00:14   Dg Abd Portable 1v  11/25/2015  CLINICAL DATA:  Feeding tube placement. EXAM: PORTABLE ABDOMEN - 1 VIEW COMPARISON:  11/21/2015 FINDINGS: Enteric tube tip is across the midline in the right upper quadrant consistent with location in the distal stomach or proximal duodenum. Motion artifact limits examination. IMPRESSION: Enteric tube tip is in the right upper quadrant consistent with location in the distal stomach or proximal duodenum. Electronically Signed   By: Lucienne Capers M.D.   On: 11/25/2015 01:19   Basilar L>R atx  STUDIES:  12/09/2023  LHC - stent lad 12-09-2023  CT head - no acute abnormality 11/30  eeg - no seizure activity. suppression 12/03  Port CXR 12/3 - Bilat lower lung opacity.  CULTURES: Resp 12/9 >> normal flora C diff 12/12 >> positive   ANTIBIOTICS: Levaquin 11/30 >> 12/4 Vanco 12/9  >> > stop date Aztreonam 12/9 >> Vanco PO 12/12 >>   SIGNIFICANT EVENTS: December 09, 2023 cardiac arrest > cath lab, out to ICU, therapeutic hypothermia.   12/02 completed rewarming 12/9 Extubated 12/10 getting OOB. Still weak, kept in ICU for intensive pulmonary toilet. Added decadron for Laryngeal edema.  12/11 remarkably better. Phonation improved. Getting OOB.  12/13 reintubated for laryngeal edema.  LINES/TUBES: ETT 12-09-2023 >> 12/8>>>12/13>>> CVL 12/09/2023 >> 12/9 Left radial December 09, 2023 >> 12/3  ASSESSMENT / PLAN:  PULMONARY A: Recurrent Acute Hypoxic Respiratory Failure, multifactorial due to overall illness + C diff colitis / dehydration + altered Severe CAP vs Aspiration Pneumonia  HCAP Probable post-intubation Laryngeal edema   P: Full vent support. Adjust vent for ABG. Titrate O2 for sat of 88-92%. See ID section.  CARDIOVASCULAR A:  Cardiac arrest - VT/VF STEMI s/p PCI 12-09-23 ST HTN ICM w ef 25%-->now 50-55% after intervention  P:  Care per Cardiology-->hold all anti-HTN Neo for BP support. Asa, Brilinta, Lopressor & Lipitor > will place OGT to allow him to receive   RENAL A:   Hypernatremia. Worsening renal function. P:   Monitor UOP / BMP. Replace electrolytes as indicated. IVF NS at 10 ml/hr. CVP  GASTROINTESTINAL A:   Transaminitis -  Shock liver. Resolving. At Risk Protein Calorie Malnutrition  Post intubation dysphagia  P:   Restart TF per nutrition. Pepcid IV q12hr  HEMATOLOGIC A:   Leukocytosis - Improving. Suspect stress response & exacerbated by steroids Anemia  P:  Follow CBC Antiplatelet / anticoagulation per Cardiology Heparin [place] q8hr for DVT prophylaxis   INFECTIOUS A:   Possible CAP HCAP C diff colitis positive  P:   Broad-spectrum antibiotics for HCAP started 12/9, continue aztreonam and PO vancomycin (for c diff), continue for now. Monitor fever curve/WBC. Enteral vanco started 12/12.  ENDOCRINE A:   Hyperglycemia - No H/O DM.  P:   ISS q4. SSI coverage.  AUTOIMMUNE:  A:  Rheumatoid Arthritis - on DMARD & Methotrexate at baseline   P:  Hold home  medications  NEUROLOGIC A:   Deconditioning s/p critical illness  P:   Stop trazodone GTT fentanyl and versed pushes  FAMILY - Updates: Wife updated bedside.   The patient is critically ill with multiple organ systems failure and requires high complexity decision making for assessment and support, frequent evaluation and titration of therapies, application of advanced monitoring technologies and extensive interpretation of multiple databases.   Critical Care Time devoted to patient care services described in this note is  45  Minutes. This time reflects time of care of this signee Dr Jennet Maduro. This critical care time does not reflect procedure time, or teaching time or supervisory time of PA/NP/Med student/Med Resident etc but could involve care discussion time.  Rush Farmer, M.D. [facility] Pulmonary/Critical Care Medicine. Pager: [phone]. After hours pager: [phone].

## 2015-11-25 NOTE — Progress Notes (Signed)
eLink Physician-Brief Progress Note Patient Name: [name] DOB: 1946-03-15 MRN: [id number]   Date of Service  11/25/2015  HPI/Events of Note  S/p intubation . Now on vent dysnchrony and some tachypnea  eICU Interventions  - inc fent/versed prn range for vent dsynchrony       Intervention Category Minor Interventions: Agitation / anxiety - evaluation and management  [name] 11/25/2015, 4:00 AM

## 2015-11-25 NOTE — Procedures (Signed)
Intubation Procedure Note COPE BENZON [id number] 1945/12/21  Procedure: Intubation Indications: Airway protection and maintenance  Procedure Details Consent: Unable to obtain consent because of altered level of consciousness. Time Out: Verified patient identification, verified procedure, site/side was marked, verified correct patient position, special equipment/implants available, medications/allergies/relevent history reviewed, required imaging and test results available.  Performed  Maximum sterile technique was used including antiseptics.  Miller    Evaluation Hemodynamic Status: BP stable throughout; O2 sats: stable throughout Patient's Current Condition: stable Complications: No apparent complications Patient did tolerate procedure well. Chest X-ray ordered to verify placement.  CXR: pending.   Chriss Driver [facility] 11/25/2015

## 2015-11-25 NOTE — Progress Notes (Signed)
SLP Cancellation Note  Patient Details Name: [name] MRN: [id number] DOB: [birth year]   Cancelled treatment:       Reason Eval/Treat Not Completed: Medical issues which prohibited therapy. Pt intubated. Will follow for readiness   Herron Fero, Katherene Ponto 11/25/2015, 7:37 AM

## 2015-11-25 NOTE — Progress Notes (Signed)
Subjective:    Day of hospitalization: 1   69 [name] admitted for out of hospital Vfib arrest on 11/29.  EKG with acute anterior STEMI taken to cath lab with total occlusion of proximal LAD treated with DES.  Had difficult extubation complicated by laryngeal edema treated with dexamethasone and HCAP on abx.   Yesterday, stool for c.diff positive and became more somnolent and tachypneic requiring reintubation.     Objective:   Temp:  [98 F (36.7 C)-99.7 F (37.6 C)] 98.9 F (37.2 C) (12/13 0400) Pulse Rate:  [102-129] 122 (12/13 0500) Resp:  [20-39] 36 (12/13 0500) BP: (63-136)/(26-83) 82/56 mmHg (12/13 0500) SpO2:  [79 %-100 %] 100 % (12/13 0500) FiO2 (%):  [4 %-70 %] 70 % (12/13 0400) Last BM Date: 11/24/15  Filed Weights   11/22/15 0500 11/23/15 0353 11/24/15 0242  Weight: 82.6 kg (182 lb 1.6 oz) 80.6 kg (177 lb 11.1 oz) 79.1 kg (174 lb 6.1 oz)    Intake/Output Summary (Last 24 hours) at 11/25/15 0534 Last data filed at 11/25/15 0500  Gross per 24 hour  Intake   1165 ml  Output   1550 ml  Net   -385 ml    Physical Exam: General: Intubated.  Lungs: CTAB.  Cardiac: tachycardic, no m/r/g. Abdomen: +BS, NT/ND.   Extremities: No LE edema.   Lab Results:  Basic Metabolic Panel:  Recent Labs Lab 11/22/15 0431 11/23/15 0331 11/24/15 0319 11/25/15 0258  NA 148* 147* 148* 149*  K 3.6 3.7 3.7 4.1  CL 117* 111 113* 116*  CO2 25 28 27  20*  GLUCOSE 196* 190* 181* 130*  BUN 39* 41* 40* 72*  CREATININE 0.94 1.00 0.98 1.70*  CALCIUM 8.5* 8.6* 8.6* 8.3*  MG 2.5* 2.6* 2.5*  --     Liver Function Tests:  Recent Labs Lab 11/22/15 0431 11/23/15 0331 11/24/15 0319  ALBUMIN 2.5* 2.5* 2.6*    CBC:  Recent Labs Lab 11/23/15 0331 11/24/15 0319 11/25/15 0258  WBC 26.1* 29.8* 29.7*  HGB 12.3* 15.4 16.1  HCT 38.7* 46.8 49.1  MCV 96.3 96.5 97.6  PLT 319 385 435*    Cardiac Enzymes: No results for input(Poole): CKTOTAL, CKMB, CKMBINDEX, TROPONINI in the  last 168 hours.  BNP: No results for input(Poole): PROBNP in the last 8760 hours.  Coagulation: No results for input(Poole): INR in the last 168 hours.  Radiology: Dg Chest Port 1 View  11/25/2015  CLINICAL DATA:  Acute respiratory failure.  Shortness of breath. EXAM: PORTABLE CHEST 1 VIEW COMPARISON:  11/25/2015 FINDINGS: Interval placement of an endotracheal tube with tip measuring 3.6 cm above the carina. An enteric tube is been placed with tip off the field of view but below the left hemidiaphragm. Normal heart size and pulmonary vascularity. Increasing atelectasis in the left lung base. Persistent linear atelectasis in both mid lungs. No pneumothorax. No blunting of costophrenic angles. Mediastinal contours appear intact. IMPRESSION: Appliances appear in satisfactory location. Increasing atelectasis in the left lung base with persistent linear atelectasis in both mid lungs. Electronically Signed   By: Lucienne Capers M.D.   On: 11/25/2015 01:18   Dg Chest Portable 1 View  11/25/2015  CLINICAL DATA:  Shortness of breath.  Oxygen desaturation. EXAM: PORTABLE CHEST 1 VIEW COMPARISON:  11/24/2015 FINDINGS: Shallow inspiration. Increasing atelectasis in the left lung base with persistent atelectasis in the bilateral mid lung. Heart size is normal. Mediastinal contours appear intact. No pneumothorax. No blunting of costophrenic angles. IMPRESSION: Increasing  atelectasis in the left lung base with persistent linear atelectasis in the mid lungs bilaterally. Electronically Signed   By: Lucienne Capers M.D.   On: 11/25/2015 00:14   Dg Chest Port 1 View  11/24/2015  CLINICAL DATA:  Pneumonia. EXAM: PORTABLE CHEST 1 VIEW COMPARISON:  11/22/2015. FINDINGS: Feeding tube noted coiled in stomach. Mediastinum hilar structures normal. Interim improvement of aeration. Persistent mild bibasilar subsegmental atelectasis. No pleural effusion or pneumothorax. Heart size stable. No acute bony abnormality. IMPRESSION:  Interim improvement of aeration with persistent mild bibasilar subsegmental atelectasis. Electronically Signed   By: Marcello Moores  Register   On: 11/24/2015 07:10   Dg Abd Portable 1v  11/25/2015  CLINICAL DATA:  Feeding tube placement. EXAM: PORTABLE ABDOMEN - 1 VIEW COMPARISON:  11/21/2015 FINDINGS: Enteric tube tip is across the midline in the right upper quadrant consistent with location in the distal stomach or proximal duodenum. Motion artifact limits examination. IMPRESSION: Enteric tube tip is in the right upper quadrant consistent with location in the distal stomach or proximal duodenum. Electronically Signed   By: Lucienne Capers M.D.   On: 11/25/2015 01:19    Cardiac studies:   ECG:   Medications:   Scheduled Medications: . antiseptic oral rinse  7 mL Mouth Rinse QID  . aspirin  81 mg Oral Daily  . atorvastatin  80 mg Oral q1800  . aztreonam  1 g Intravenous 3 times per day  . carvedilol  12.5 mg Oral BID WC  . chlorhexidine gluconate  15 mL Mouth Rinse BID  . enoxaparin (LOVENOX) injection  40 mg Subcutaneous Q24H  . famotidine  20 mg Oral BID  . fluticasone  2 spray Each Nare Daily  . guaiFENesin  15 mL Per Tube 6 times per day  . insulin aspart  0-20 Units Subcutaneous 6 times per day  . insulin aspart  3 Units Subcutaneous 6 times per day  . oxymetazoline  1 spray Each Nare BID  . ramipril  2.5 mg Oral Daily  . sodium chloride  3 mL Intravenous Q12H  . tamsulosin  0.4 mg Oral Daily  . ticagrelor  90 mg Oral BID  . traZODone  100 mg Oral QHS  . vancomycin  500 mg Oral 4 times per day    Infusions: . sodium chloride 10 mL/hr at 11/23/15 0800  . phenylephrine (NEO-SYNEPHRINE) Adult infusion      PRN Medications: sodium chloride, acetaminophen, fentaNYL (SUBLIMAZE) injection, fentaNYL (SUBLIMAZE) injection, haloperidol lactate, midazolam, midazolam, ondansetron (ZOFRAN) IV, RESOURCE THICKENUP CLEAR, sodium chloride, sodium chloride, sodium chloride   Assessment and  Plan:   25 YOM admitted with out of hospital Vfib arrest in setting of anterior STEMI now Poole/p LAD PCI. Now with c.diff and reintubated.    Ventricular fibrillation arrest In setting of anterior STEMI, Poole/p DES to LAD -hold coreg and ACEi d/t soft BP, tachycardia likely d/t acute illness  STEMI Poole/p DES to LAD.  -cont ASA 81 and brilinta  -cont atorvastatin 80mg  qd  Acute/chronic systolic CHF EF initially 25% on LV-gram, improved to 50-55% after intervention.  -cont ramipril 2.5 daily and coreg per above  HCAP -per PCCM, on vancomycin and aztreonam  C diff -on vancomycin   Robert Bales, MD PGY-3, Internal Medicine Teaching Service 11/25/2015, 5:34 AM    I have examined the patient and reviewed assessment and plan and discussed with patient.  Agree with above as stated.  Now intubated.  Holding Coreg while BP lower. HR around 100.  SPoke  with the wife at length. Continue C.Diff Rx.  [name],[name].

## 2015-11-25 NOTE — Progress Notes (Signed)
2 RTs at the bedside, patient's O2 sats slightly improved with high-flow O2, but he remains somnolent and tachypnic. Orders for ABG and chest x-ray obtained from Dr. Meda Coffee with cardiology. CCM also notified about patient's deteriorating condition. Dr. Lamonte Sakai to come to bedside.

## 2015-11-25 NOTE — Progress Notes (Signed)
PULMONARY / CRITICAL CARE MEDICINE   Name: [name] MRN: [id number] DOB: 1945-12-20    ADMISSION DATE:  11/11/2015 CONSULTATION DATE:  11/11/2015  REFERRING MD:  Dr. Burt Knack  CHIEF COMPLAINT:  Cardiac arrest  HISTORY OF PRESENT ILLNESS:  69 year old male with PMH of RA and HTN. He presented to [facility] ED 11/29 after a witnessed cardiac arrest at home. He has had intermittent chest and arm pain for about 2 weeks status post mechanical fall. 11/29 chest pain acutely worsened and he unfortunately suffered a cardiac arrest, which was witnessed by his wife and EMS was called. Family initiated CPR. EMS arrived and AED was placed advising shock. 2 shocks were delivered and ROSC was achieved. 2 rounds epinephrine also administerred. EKG in the field was concerning for STEMI and code STEMI was called. In ED patient was unresponsive, he was intubated and taken emergently to cardiac cath lab where he remains at this time. Occlusive LAD lesion identified and intervened upon. PCCM consulted for ICU care. Total downtime estimated at less than 15 mins.   SUBJECTIVE / Interval events: Increased WOB through the day today. Has had continual diarrhea, volume unclear but changing about every hour. More fatigued and now lethargic after pm trazodone given. ABG with hypoxemia and respiratory alkalosis   VITAL SIGNS: BP 112/83 mmHg  Pulse 121  Temp(Src) 98 F (36.7 C) (Oral)  Resp 39  Ht 5\' 7"  (1.702 m)  Wt 79.1 kg (174 lb 6.1 oz)  BMI 27.31 kg/m2  SpO2 95% 40% high flow  HEMODYNAMICS:    Vent Mode:  [-]  FiO2 (%):  [4 %-40 %] 4 %  INTAKE / OUTPUT: I/O last 3 completed shifts: In: 3945 [P.O.:25; I.V.:350; NG/GT:3170; IV Piggyback:400] Out: 2350 [Urine:2350]  PHYSICAL EXAMINATION: General: Ill appearing, accessory muscle use Neuro: obtunded, barely responds, opens eyes to pain HEENT: Moist mucous membranes, UA noise present probably secretions Cardiovascular: S1-S2, regular rate and  rhythm, no murmurs rubs gallops  Lungs:scattered rhonchi B Abdomen: Somewhat distended, positive bowel sounds  Skin: Intact.  -edema   LABS:  CBC  Recent Labs Lab 11/22/15 0431 11/23/15 0331 11/24/15 0319  WBC 21.1* 26.1* 29.8*  HGB 13.0 12.3* 15.4  HCT 39.5 38.7* 46.8  PLT 264 319 385   Coag's No results for input(s): APTT, INR in the last 168 hours. BMET  Recent Labs Lab 11/22/15 0431 11/23/15 0331 11/24/15 0319  NA 148* 147* 148*  K 3.6 3.7 3.7  CL 117* 111 113*  CO2 25 28 27   BUN 39* 41* 40*  CREATININE 0.94 1.00 0.98  GLUCOSE 196* 190* 181*   Electrolytes  Recent Labs Lab 11/22/15 0431 11/23/15 0331 11/24/15 0319  CALCIUM 8.5* 8.6* 8.6*  MG 2.5* 2.6* 2.5*  PHOS 2.2* 2.7 3.4   Sepsis Markers  Recent Labs Lab 11/20/15 1059 11/21/15 0438 11/22/15 0431  PROCALCITON 0.45 0.32 0.24   ABG  Recent Labs Lab 11/18/15 1150 11/25/15 0014  PHART 7.470* 7.515*  PCO2ART 36.4 25.8*  PO2ART 77.3* 53.0*   Liver Enzymes  Recent Labs Lab 11/22/15 0431 11/23/15 0331 11/24/15 0319  ALBUMIN 2.5* 2.5* 2.6*   Cardiac Enzymes No results for input(s): TROPONINI, PROBNP in the last 168 hours. Glucose  Recent Labs Lab 11/23/15 2336 11/24/15 0342 11/24/15 0836 11/24/15 1150 11/24/15 1637 11/24/15 2029  GLUCAP 197* 163* 226* 190* 163* 155*   Imaging Dg Chest Portable 1 View  11/25/2015  CLINICAL DATA:  Shortness of breath.  Oxygen desaturation. EXAM:  PORTABLE CHEST 1 VIEW COMPARISON:  11/24/2015 FINDINGS: Shallow inspiration. Increasing atelectasis in the left lung base with persistent atelectasis in the bilateral mid lung. Heart size is normal. Mediastinal contours appear intact. No pneumothorax. No blunting of costophrenic angles. IMPRESSION: Increasing atelectasis in the left lung base with persistent linear atelectasis in the mid lungs bilaterally. Electronically Signed   By: Lucienne Capers M.D.   On: 11/25/2015 00:14   Dg Chest Port 1  View  11/24/2015  CLINICAL DATA:  Pneumonia. EXAM: PORTABLE CHEST 1 VIEW COMPARISON:  11/22/2015. FINDINGS: Feeding tube noted coiled in stomach. Mediastinum hilar structures normal. Interim improvement of aeration. Persistent mild bibasilar subsegmental atelectasis. No pleural effusion or pneumothorax. Heart size stable. No acute bony abnormality. IMPRESSION: Interim improvement of aeration with persistent mild bibasilar subsegmental atelectasis. Electronically Signed   By: Marcello Moores  Register   On: 11/24/2015 07:10   Basilar L>R atx  STUDIES:  Dec 04, 2023  LHC - stent lad 12-04-2023  CT head - no acute abnormality 11/30  eeg - no seizure activity. suppression 12/03  Port CXR 12/3 - Bilat lower lung opacity.  CULTURES: Resp 12/9 >> normal flora C diff 12/12 >> positive   ANTIBIOTICS: Levaquin 11/30 >> 12/4 Vanco 12/9  >> > stop date Aztreonam 12/9 >> Vanco PO 12/12 >>   SIGNIFICANT EVENTS: 2023/12/04 cardiac arrest > cath lab, out to ICU, therapeutic hypothermia.  12/02 completed rewarming 12/9 Extubated 12/10 getting OOB. Still weak, kept in ICU for intensive pulmonary toilet. Added decadron for Laryngeal edema.  12/11 remarkably better. Phonation improved. Getting OOB.   LINES/TUBES: ETT 12-04-2023 >> 12/8 CVL 12/04/2023 >> 12/9 Left radial December 04, 2023 >> 12/3   ASSESSMENT / PLAN:  PULMONARY A: Recurrent Acute Hypoxic Respiratory Failure, multifactorial due to overall illness + C diff colitis / dehydration + altered Severe CAP vs Aspiration Pneumonia  HCAP Probable post-intubation Laryngeal edema   P: Reintubate now  CARDIOVASCULAR A:  Cardiac arrest - VT/VF STEMI s/p PCI 12/04/23 ST HTN ICM w ef 25%-->now 50-55% after intervention  P:  Care per Cardiology-->BB increased Asa, Brilinta, Lopressor & Lipitor > will place OGT to allow him to receive   RENAL A:   Hypernatremia   P:   Monitor UOP / BMP Free water replacement   GASTROINTESTINAL A:   Transaminitis - Shock liver.  Resolving. At Risk Protein Calorie Malnutrition  Post intubation dysphagia  P:   NPO Pepcid IV q12hr  HEMATOLOGIC A:   Leukocytosis - Improving. Suspect stress response & exacerbated by steroids Anemia  P:  Follow CBC Antiplatelet / anticoagulation per Cardiology Heparin [place] q8hr for DVT prophylaxis   INFECTIOUS A:   Possible CAP HCAP C diff colitis   P:   Broad-spectrum antibiotics for HCAP started 12/9 recommend aztreonam x 8 days Monitor fever curve / WBC Enteral vanco started 12/12  ENDOCRINE A:   Hyperglycemia - No H/O DM.  P:   Accu-Checks q4hr-->change to AC/HS when taking POs SSI coverage  AUTOIMMUNE:  A:  Rheumatoid Arthritis - on DMARD & Methotrexate at baseline   P:  Hold home medications  NEUROLOGIC A:   Deconditioning s/p critical illness  P:   Stop trazodone Prn fentanyl and versed pushes   FAMILY - Updates: family not present at this time.    Independent CC time 45 minutes  Baltazar Apo, MD, PhD 11/25/2015, 1:04 AM  Pulmonary and Critical Care [phone] or if no answer [phone]

## 2015-11-25 NOTE — Procedures (Signed)
Intubation Procedure Note [name] [id number] 1946-12-09  Procedure: Intubation Indications: Respiratory insufficiency  Procedure Details Consent: Unable to obtain consent because of altered level of consciousness. Time Out: Verified patient identification, verified procedure, site/side was marked, verified correct patient position, special equipment/implants available, medications/allergies/relevent history reviewed, required imaging and test results available.  Performed  Maximum sterile technique was used including gloves, gown, hand hygiene and mask.  8.0 ETT placed using 4.0 glidescope after fentanyl 168mcg + etomidate 20mg . Position confirmed by auscultation, direct visualization    Evaluation Hemodynamic Status: Transient hypotension treated with fluid; O2 sats: stable throughout Patient's Current Condition: stable Complications: No apparent complications Patient did tolerate procedure well. Chest X-ray ordered to verify placement.  CXR: pending.   Baltazar Apo, MD, PhD 11/25/2015, 12:48 AM [facility] [phone] or if no answer [phone]

## 2015-11-25 NOTE — Procedures (Signed)
Central Venous Catheter Insertion Procedure Note [name] [id number] [birth year]  Procedure: Insertion of Central Venous Catheter Indications: Assessment of intravascular volume, Drug and/or fluid administration and Frequent blood sampling  Procedure Details Consent: Risks of procedure as well as the alternatives and risks of each were explained to the (patient/caregiver).  Consent for procedure obtained. Time Out: Verified patient identification, verified procedure, site/side was marked, verified correct patient position, special equipment/implants available, medications/allergies/relevent history reviewed, required imaging and test results available.  Performed Real time [place] used to ID and cannulate the vessel  Maximum sterile technique was used including antiseptics, cap, gloves, gown, hand hygiene, mask and sheet. Skin prep: Chlorhexidine; local anesthetic administered A antimicrobial bonded/coated triple lumen catheter was placed in the right internal jugular vein using the Seldinger technique.  Evaluation Blood flow good Complications: No apparent complications Patient did tolerate procedure well. Chest X-ray ordered to verify placement.  CXR: pending.  [name] 11/25/2015, 10:13 AM  Rush Farmer, M.D. [facility] Pulmonary/Critical Care Medicine. Pager: [phone]. After hours pager: [phone].

## 2015-11-25 NOTE — Progress Notes (Signed)
Spoke to Neoma Laming Graca-patient's wife about patient's condition and changes overnight and his current status.

## 2015-11-25 NOTE — Progress Notes (Signed)
Foley catheter was inserted as patient was emergently intubated and is sedated. Foley huddle done with 3 RNs at the bedside.This RN and Melene Plan present during foley catheter insertion. Patient educated, but was not able to teach back due to meds and intubation.

## 2015-11-25 NOTE — Progress Notes (Signed)
Patient's O2 Sats down to 80's, he is now more sleepy and only wakes up momentarily when woken up. Respirations remain high in upper 30s. RT called to assess and start on high-flow O2.

## 2015-11-25 NOTE — Progress Notes (Signed)
Nutrition Follow-up  INTERVENTION:   Resume Vital AF 1.2 @ 70 ml/hr via OG tube Provides: 2016 kcal, 126 grams protein, and 1362 ml H2O.    NUTRITION DIAGNOSIS:   Inadequate oral intake related to inability to eat as evidenced by NPO status. Ongoing.  GOAL:   Patient will meet greater than or equal to 90% of their needs Not met.   MONITOR:   Vent status, Labs, Weight trends, TF tolerance, Skin  REASON FOR ASSESSMENT:   Consult Enteral/tube feeding initiation and management  ASSESSMENT:   69 y.o. male w/ PMHx significant for rheumatoid arthritis and HTN who presented to [facility] on 11/11/2015 after an out-of-hospital cardiac arrest. The patient reportedly had chest and arm pain intermittently for 2 weeks after sustaining a mechanical fall. Today he had worsening of his chest pain and he ultimately arrested at home, witnessed by his wife.  Patient is currently intubated on ventilator support MV: 16.7 L/min Temp (24hrs), Avg:98.7 F (37.1 C), Min:98 F (36.7 C), Max:99.7 F (37.6 C)  12/12 Diet advanced to Dysphagia 3 with Honey thickened liquids; Cortrak tube pulled out; tested positive for c. diff 12/13 Early am pt re-intubated, OG tube placed, rectal tube placed (18 stools documented)  Labs reviewed: sodium elevated (149) CBG's: 128-142 Discussed plan with RN. Will resume previous feeding as pt tolerating this well and meets pts nutrition needs.    Diet Order:  Diet NPO time specified  Skin:  Reviewed, no issues  Last BM:  12/13 18 stools doc, rectal tube inserted  Height:   Ht Readings from Last 1 Encounters:  11/18/15 _0  (1.702 m)   Weight:   Wt Readings from Last 1 Encounters:  11/25/15 175 lb 14.8 oz (79.8 kg)   Ideal Body Weight:  67.27 kg  BMI:  Body mass index is 27.55 kg/(m^2).  Estimated Nutritional Needs:   Kcal:  2055  Protein:  95-110 grams  Fluid:  > 1.9 L/day  EDUCATION NEEDS:   No education needs identified at  this time  [place], [place], [place] Pager [phone] After Hours Pager

## 2015-11-25 NOTE — Progress Notes (Signed)
   11/25/15 1000  Clinical Encounter Type  Visited With Family;Patient;Patient and family together  Visit Type Follow-up;Spiritual support;Social support  Spiritual Encounters  Spiritual Needs Emotional  Stress Factors  Patient Stress Factors Health changes;Major life changes  Family Stress Factors Exhausted;Health changes  [facility] follow-up with pt & family; pt had rough night and is intubated; [facility] assessed family spiritual needs and offered appropriate support; additional follow-up by [facility] recommended. 10:41 AM Gwynn Burly

## 2015-11-26 ENCOUNTER — Inpatient Hospital Stay (HOSPITAL_COMMUNITY): Payer: BLUE CROSS/BLUE SHIELD

## 2015-11-26 LAB — POCT I-STAT 3, ART BLOOD GAS (G3+)
Acid-base deficit: 3 mmol/L — ABNORMAL HIGH (ref 0.0–2.0)
Bicarbonate: 21.3 mEq/L (ref 20.0–24.0)
O2 Saturation: 95 %
PCO2 ART: 35 mmHg (ref 35.0–45.0)
PH ART: 7.396 (ref 7.350–7.450)
PO2 ART: 78 mmHg — AB (ref 80.0–100.0)
Patient temperature: 99.9
TCO2: 22 mmol/L (ref 0–100)

## 2015-11-26 LAB — GLUCOSE, CAPILLARY
GLUCOSE-CAPILLARY: 125 mg/dL — AB (ref 65–99)
GLUCOSE-CAPILLARY: 197 mg/dL — AB (ref 65–99)
GLUCOSE-CAPILLARY: 236 mg/dL — AB (ref 65–99)
Glucose-Capillary: 237 mg/dL — ABNORMAL HIGH (ref 65–99)
Glucose-Capillary: 188 mg/dL — ABNORMAL HIGH (ref 65–99)
Glucose-Capillary: 225 mg/dL — ABNORMAL HIGH (ref 65–99)

## 2015-11-26 LAB — BASIC METABOLIC PANEL
Anion gap: 8 (ref 5–15)
BUN: 84 mg/dL — AB (ref 6–20)
CALCIUM: 7.5 mg/dL — AB (ref 8.9–10.3)
CO2: 22 mmol/L (ref 22–32)
CREATININE: 1.81 mg/dL — AB (ref 0.61–1.24)
Chloride: 115 mmol/L — ABNORMAL HIGH (ref 101–111)
GFR calc Af Amer: 43 mL/min — ABNORMAL LOW (ref >60)
GFR, EST NON AFRICAN AMERICAN: 37 mL/min — AB (ref >60)
Glucose, Bld: 263 mg/dL — ABNORMAL HIGH (ref 65–99)
Potassium: 3.4 mmol/L — ABNORMAL LOW (ref 3.5–5.1)
SODIUM: 145 mmol/L (ref 135–145)

## 2015-11-26 LAB — MAGNESIUM: Magnesium: 2.9 mg/dL — ABNORMAL HIGH (ref 1.7–2.4)

## 2015-11-26 LAB — CBC
HCT: 42.1 % (ref 39.0–52.0)
Hemoglobin: 13.7 g/dL (ref 13.0–17.0)
MCH: 31.5 pg (ref 26.0–34.0)
MCHC: 32.5 g/dL (ref 30.0–36.0)
MCV: 96.8 fL (ref 78.0–100.0)
PLATELETS: 398 10*3/uL (ref 150–400)
RBC: 4.35 MIL/uL (ref 4.22–5.81)
RDW: 15.9 % — ABNORMAL HIGH (ref 11.5–15.5)
WBC: 37.5 10*3/uL — ABNORMAL HIGH (ref 4.0–10.5)

## 2015-11-26 LAB — PHOSPHORUS: PHOSPHORUS: 5.1 mg/dL — AB (ref 2.5–4.6)

## 2015-11-26 MED ORDER — GERHARDT'S BUTT CREAM
TOPICAL_CREAM | CUTANEOUS | Status: DC | PRN
  Administered 2015-11-27 – 2015-12-04 (×6): via TOPICAL
  Filled 2015-11-26 (×4): qty 1

## 2015-11-26 MED ORDER — METRONIDAZOLE IN NACL 5-0.79 MG/ML-% IV SOLN
500.0000 mg | Freq: Three times a day (TID) | INTRAVENOUS | Status: DC
  Administered 2015-11-26 – 2015-12-04 (×25): 500 mg via INTRAVENOUS
  Filled 2015-11-26 (×25): qty 100

## 2015-11-26 MED ORDER — INSULIN ASPART 100 UNIT/ML ~~LOC~~ SOLN
2.0000 [IU] | SUBCUTANEOUS | Status: DC
  Administered 2015-11-26: 2 [IU] via SUBCUTANEOUS
  Administered 2015-11-26 (×2): 4 [IU] via SUBCUTANEOUS
  Administered 2015-11-27: 6 [IU] via SUBCUTANEOUS
  Administered 2015-11-27 (×3): 4 [IU] via SUBCUTANEOUS

## 2015-11-26 MED ORDER — SODIUM CHLORIDE 0.9 % IJ SOLN: 10.0000 mL | INTRAMUSCULAR | Status: DC | PRN

## 2015-11-26 MED ORDER — MIDAZOLAM HCL 2 MG/2ML IJ SOLN
1.0000 mg | INTRAMUSCULAR | Status: DC | PRN
  Administered 2015-11-26 – 2015-11-27 (×5): 2 mg via INTRAVENOUS
  Filled 2015-11-26 (×5): qty 2

## 2015-11-26 MED ORDER — SODIUM CHLORIDE 0.9 % IV SOLN: INTRAVENOUS | Status: DC

## 2015-11-26 MED ORDER — SODIUM CHLORIDE 0.9 % IJ SOLN
10.0000 mL | Freq: Two times a day (BID) | INTRAMUSCULAR | Status: DC
  Administered 2015-11-26 – 2015-11-28 (×4): 10 mL
  Administered 2015-11-29: 30 mL
  Administered 2015-11-30: 10 mL

## 2015-11-26 MED ORDER — SODIUM CHLORIDE 0.9 % IV SOLN
INTRAVENOUS | Status: DC
  Administered 2015-11-26: 11:00:00 via INTRAVENOUS

## 2015-11-26 MED ORDER — POTASSIUM CHLORIDE 10 MEQ/50ML IV SOLN
10.0000 meq | INTRAVENOUS | Status: AC
  Administered 2015-11-26 (×4): 10 meq via INTRAVENOUS
  Filled 2015-11-26 (×5): qty 50

## 2015-11-26 MED ORDER — INSULIN ASPART 100 UNIT/ML ~~LOC~~ SOLN
4.0000 [IU] | SUBCUTANEOUS | Status: DC
  Administered 2015-11-26 – 2015-11-27 (×7): 4 [IU] via SUBCUTANEOUS

## 2015-11-26 NOTE — Progress Notes (Signed)
PULMONARY / CRITICAL CARE MEDICINE   Name: [name] MRN: [id number] DOB: [birth year]    ADMISSION DATE:  11/11/2015 CONSULTATION DATE:  11/11/2015  REFERRING MD:  Dr. Burt Knack  CHIEF COMPLAINT:  Cardiac arrest  HISTORY OF PRESENT ILLNESS:  69 year old male with PMH of RA and HTN. He presented to [facility] ED 11/29 after a witnessed cardiac arrest at home. He has had intermittent chest and arm pain for about 2 weeks status post mechanical fall. 11/29 chest pain acutely worsened and he unfortunately suffered a cardiac arrest, which was witnessed by his wife and EMS was called. Family initiated CPR. EMS arrived and AED was placed advising shock. 2 shocks were delivered and ROSC was achieved. 2 rounds epinephrine also administerred. EKG in the field was concerning for STEMI and code STEMI was called. In ED patient was unresponsive, he was intubated and taken emergently to cardiac cath lab where he remains at this time. Occlusive LAD lesion identified and intervened upon. PCCM consulted for ICU care. Total downtime estimated at less than 15 mins.   SUBJECTIVE / Interval events: Increased WOB through the day today. Has had continual diarrhea, volume unclear but changing about every hour. More fatigued and now lethargic after pm trazodone given. ABG with hypoxemia and respiratory alkalosis   VITAL SIGNS: BP 93/61 mmHg  Pulse 115  Temp(Src) 99.1 F (37.3 C) (Oral)  Resp 17  Ht 5\' 7"  (1.702 m)  Wt 79.3 kg (174 lb 13.2 oz)  BMI 27.37 kg/m2  SpO2 94% 40% high flow  HEMODYNAMICS: CVP:  [3 mmHg-5 mmHg] 3 mmHg  Vent Mode:  [-] PRVC FiO2 (%):  [30 %-70 %] 30 % Set Rate:  [12 bmp] 12 bmp Vt Set:  [550 mL] 550 mL PEEP:  [5 cmH20] 5 cmH20 Plateau Pressure:  [5 cmH20-15 cmH20] 15 cmH20  INTAKE / OUTPUT: I/O last 3 completed shifts: In: 3783.3 [I.V.:2030.1; NG/GT:1003.2; IV Piggyback:750] Out: 2305 [Urine:2025; Stool:280]  PHYSICAL EXAMINATION: General: Ill appearing, sedated and  intubated but withdraws all ext to pain. Neuro: Withdraws to pain but not interactive, on sedation. HEENT: Moist mucous membranes, [place]/AT, PERRL, EOM-I. Cardiovascular: S1-S2, regular rate and rhythm, no murmurs rubs gallops  Lungs: Coarse BS diffusely. Abdomen: Somewhat distended, positive bowel sounds  Skin: Intact.  -edema   LABS:  CBC  Recent Labs Lab 11/24/15 0319 11/25/15 0258 11/26/15 0352  WBC 29.8* 29.7* 37.5*  HGB 15.4 16.1 13.7  HCT 46.8 49.1 42.1  PLT 385 435* 398   Coag's No results for input(s): APTT, INR in the last 168 hours. BMET  Recent Labs Lab 11/24/15 0319 11/25/15 0258 11/26/15 0352  NA 148* 149* 145  K 3.7 4.1 3.4*  CL 113* 116* 115*  CO2 27 20* 22  BUN 40* 72* 84*  CREATININE 0.98 1.70* 1.81*  GLUCOSE 181* 130* 263*   Electrolytes  Recent Labs Lab 11/23/15 0331 11/24/15 0319 11/25/15 0258 11/26/15 0352  CALCIUM 8.6* 8.6* 8.3* 7.5*  MG 2.6* 2.5*  --  2.9*  PHOS 2.7 3.4  --  5.1*   Sepsis Markers  Recent Labs Lab 11/20/15 1059 11/21/15 0438 11/22/15 0431  PROCALCITON 0.45 0.32 0.24   ABG  Recent Labs Lab 11/25/15 0014 11/25/15 0533 11/26/15 0335  PHART 7.515* 7.493* 7.396  PCO2ART 25.8* 25.4* 35.0  PO2ART 53.0* 72.0* 78.0*   Liver Enzymes  Recent Labs Lab 11/22/15 0431 11/23/15 0331 11/24/15 0319  ALBUMIN 2.5* 2.5* 2.6*   Cardiac Enzymes No results for input(s):  TROPONINI, PROBNP in the last 168 hours. Glucose  Recent Labs Lab 11/25/15 1451 11/25/15 1745 11/25/15 2020 11/26/15 0010 11/26/15 0339 11/26/15 0755  GLUCAP 159* 162* 144* 197* 236* 237*   Imaging Dg Chest Port 1 View  11/26/2015  CLINICAL DATA:  Intubated patient, respiratory failure, acute MI with cardiac arrest, sepsis EXAM: PORTABLE CHEST 1 VIEW COMPARISON:  Portable chest x-ray of November 25, 2015 FINDINGS: Patient positioning has changed. The lungs are reasonably well inflated. There is persistent airspace opacity in the right  perihilar and infrahilar region on the right. Increased airspace opacity on the left in the perihilar region has developed. There is no pleural effusion or pneumothorax. The cardiac silhouette remains enlarged. The central pulmonary vascularity is mildly prominent. The endotracheal tube tip lies 3.3 cm above the carina. The esophagogastric tube tip projects below the inferior margin of the image. The right internal jugular venous catheter tip projects over the midportion of the SVC. IMPRESSION: Interval worsening of perihilar airspace opacities consistent with atelectasis or pneumonia. Mild central pulmonary vascular prominence is present as well. The support tubes are in reasonable position. Electronically Signed   By: David  Martinique M.D.   On: 11/26/2015 07:09   Dg Chest Port 1 View  11/25/2015  CLINICAL DATA:  Central line placement, endotracheal tube placement EXAM: PORTABLE CHEST 1 VIEW COMPARISON:  11/25/2015 FINDINGS: Cardiomediastinal silhouette is stable. Stable endotracheal and NG tube position. Persistent streaky atelectasis or infiltrate right base medially. There is right IJ central line with tip in SVC. No pneumothorax. IMPRESSION: Stable endotracheal and NG tube position. Persistent streaky atelectasis or infiltrate right base medially. There is right IJ central line with tip in SVC. No pneumothorax. Electronically Signed   By: Lahoma Crocker M.D.   On: 11/25/2015 10:48   Basilar L>R atx  STUDIES:  11-15-2023  LHC - stent lad 11-15-2023  CT head - no acute abnormality 11/30  eeg - no seizure activity. suppression 12/03  Port CXR 12/3 - Bilat lower lung opacity.  CULTURES: Resp 12/9 >> normal flora C diff 12/12 >> positive   ANTIBIOTICS: Levaquin 11/30 >> 12/4 Vanco 12/9  >> > stop date Aztreonam 12/9 >> Vanco PO 12/12 >>   SIGNIFICANT EVENTS: 11-15-2023 cardiac arrest > cath lab, out to ICU, therapeutic hypothermia.  12/02 completed rewarming 12/9 Extubated 12/10 getting OOB. Still weak, kept  in ICU for intensive pulmonary toilet. Added decadron for Laryngeal edema.  12/11 remarkably better. Phonation improved. Getting OOB.  12/13 reintubated for laryngeal edema.  LINES/TUBES: ETT 11-15-2023 >>>12/8>>>12/13>>> CVL 11-15-2023 >>>12/9 Left radial Nov 15, 2023 >>>12/3  ASSESSMENT / PLAN:  PULMONARY A: Recurrent Acute Hypoxic Respiratory Failure, multifactorial due to overall illness + C diff colitis / dehydration + altered Severe CAP vs Aspiration Pneumonia  HCAP Probable post-intubation Laryngeal edema   P: Begin PS trials but no extubation until hemodynamics are stable. Adjust vent for ABG. Titrate O2 for sat of 88-92%. See ID section.  CARDIOVASCULAR A:  Cardiac arrest - VT/VF STEMI s/p PCI 15-Nov-2023 ST HTN ICM w ef 25%-->now 50-55% after intervention   P:  Care per Cardiology-->hold all anti-HTN Neo for BP support. Asa, Brilinta, Lopressor & Lipitor > will place OGT to allow him to receive   RENAL A:   Hypernatremia. Worsening renal function, dehydrated.  P:   Monitor UOP / BMP. Replace electrolytes as indicated. IVF NS at 100 ml/hr. CVP 2.  GASTROINTESTINAL A:   Transaminitis - Shock liver. Resolving. At Risk Protein Calorie Malnutrition  Post  intubation dysphagia  P:   Continue TF per nutrition. Pepcid IV q12hr.  HEMATOLOGIC A:   Leukocytosis - Improving. Suspect stress response & exacerbated by steroids Anemia  P:  Follow CBC. Antiplatelet / anticoagulation per Cardiology. Heparin [place] q8hr for DVT prophylaxis.  INFECTIOUS A:   Possible CAP HCAP C diff colitis positive  P:   Broad-spectrum antibiotics for HCAP started 12/9, continue aztreonam and PO vancomycin (for c diff), continue for now. Monitor fever curve/WBC. Enteral vanco started 12/12. Add flagyl due to WBC increase.  ENDOCRINE A:   Hyperglycemia - No H/O DM.  P:   ISS q4. SSI coverage.  AUTOIMMUNE:  A:  Rheumatoid Arthritis - on DMARD & Methotrexate at baseline   P:  Hold  home medications  NEUROLOGIC A:   Deconditioning s/p critical illness  P:   Stop trazodone. GTT fentanyl and versed pushes.  FAMILY - Updates: Wife updated bedside.   The patient is critically ill with multiple organ systems failure and requires high complexity decision making for assessment and support, frequent evaluation and titration of therapies, application of advanced monitoring technologies and extensive interpretation of multiple databases.   Critical Care Time devoted to patient care services described in this note is  45  Minutes. This time reflects time of care of this signee Dr Jennet Maduro. This critical care time does not reflect procedure time, or teaching time or supervisory time of PA/NP/Med student/Med Resident etc but could involve care discussion time.  Rush Farmer, M.D. [facility] Pulmonary/Critical Care Medicine. Pager: [phone]. After hours pager: [phone].

## 2015-11-26 NOTE — Progress Notes (Signed)
eLink Physician-Brief Progress Note Patient Name: [name] DOB: 03/24/1946 MRN: [id number]   Date of Service  11/26/2015  HPI/Events of Note  Agitation.  eICU Interventions  Will increase Versed 1 - 4 mg IV Q 2 hours PRN to Q 1 hour PRN.      Intervention Category Minor Interventions: Agitation / anxiety - evaluation and management  Lysle Dingwall 11/26/2015, 6:57 PM

## 2015-11-26 NOTE — Progress Notes (Signed)
Subjective:    Day of hospitalization: 15   Intubated.     Objective:   Temp:  [98.5 F (36.9 C)-99.9 F (37.7 C)] 99.9 F (37.7 C) (12/14 0400) Pulse Rate:  [72-120] 112 (12/14 0400) Resp:  [14-40] 23 (12/14 0500) BP: (69-114)/(50-67) 90/60 mmHg (12/14 0500) SpO2:  [94 %-100 %] 95 % (12/14 0400) FiO2 (%):  [50 %-70 %] 50 % (12/14 0400) Weight:  [79.3 kg (174 lb 13.2 oz)] 79.3 kg (174 lb 13.2 oz) (12/14 0321) Last BM Date: 11/25/15  Filed Weights   11/24/15 0242 11/25/15 0530 11/26/15 0321  Weight: 79.1 kg (174 lb 6.1 oz) 79.8 kg (175 lb 14.8 oz) 79.3 kg (174 lb 13.2 oz)    Intake/Output Summary (Last 24 hours) at 11/26/15 [id number] Last data filed at 11/26/15 0522  Gross per 24 hour  Intake   2791 ml  Output   1810 ml  Net    981 ml    Physical Exam: General: Intubated.  Lungs: CTAB.  Cardiac: tachycardic, no m/r/g. Abdomen: +BS, mild distention.  Extremities: No LE edema.   Lab Results:  Basic Metabolic Panel:  Recent Labs Lab 11/23/15 0331 11/24/15 0319 11/25/15 0258 11/26/15 0352  NA 147* 148* 149* 145  K 3.7 3.7 4.1 3.4*  CL 111 113* 116* 115*  CO2 28 27 20* 22  GLUCOSE 190* 181* 130* 263*  BUN 41* 40* 72* 84*  CREATININE 1.00 0.98 1.70* 1.81*  CALCIUM 8.6* 8.6* 8.3* 7.5*  MG 2.6* 2.5*  --  2.9*    Liver Function Tests:  Recent Labs Lab 11/22/15 0431 11/23/15 0331 11/24/15 0319  ALBUMIN 2.5* 2.5* 2.6*    CBC:  Recent Labs Lab 11/24/15 0319 11/25/15 0258 11/26/15 0352  WBC 29.8* 29.7* 37.5*  HGB 15.4 16.1 13.7  HCT 46.8 49.1 42.1  MCV 96.5 97.6 96.8  PLT 385 435* 398    Cardiac Enzymes: No results for input(s): CKTOTAL, CKMB, CKMBINDEX, TROPONINI in the last 168 hours.  BNP: No results for input(s): PROBNP in the last 8760 hours.  Coagulation: No results for input(s): INR in the last 168 hours.  Radiology: Dg Chest Port 1 View  11/25/2015  CLINICAL DATA:  Central line placement, endotracheal tube placement EXAM:  PORTABLE CHEST 1 VIEW COMPARISON:  11/25/2015 FINDINGS: Cardiomediastinal silhouette is stable. Stable endotracheal and NG tube position. Persistent streaky atelectasis or infiltrate right base medially. There is right IJ central line with tip in SVC. No pneumothorax. IMPRESSION: Stable endotracheal and NG tube position. Persistent streaky atelectasis or infiltrate right base medially. There is right IJ central line with tip in SVC. No pneumothorax. Electronically Signed   By: Lahoma Crocker M.D.   On: 11/25/2015 10:48   Dg Chest Port 1 View  11/25/2015  CLINICAL DATA:  Acute respiratory failure.  Shortness of breath. EXAM: PORTABLE CHEST 1 VIEW COMPARISON:  11/25/2015 FINDINGS: Interval placement of an endotracheal tube with tip measuring 3.6 cm above the carina. An enteric tube is been placed with tip off the field of view but below the left hemidiaphragm. Normal heart size and pulmonary vascularity. Increasing atelectasis in the left lung base. Persistent linear atelectasis in both mid lungs. No pneumothorax. No blunting of costophrenic angles. Mediastinal contours appear intact. IMPRESSION: Appliances appear in satisfactory location. Increasing atelectasis in the left lung base with persistent linear atelectasis in both mid lungs. Electronically Signed   By: Lucienne Capers M.D.   On: 11/25/2015 01:18   Dg Chest Portable  1 View  11/25/2015  CLINICAL DATA:  Shortness of breath.  Oxygen desaturation. EXAM: PORTABLE CHEST 1 VIEW COMPARISON:  11/24/2015 FINDINGS: Shallow inspiration. Increasing atelectasis in the left lung base with persistent atelectasis in the bilateral mid lung. Heart size is normal. Mediastinal contours appear intact. No pneumothorax. No blunting of costophrenic angles. IMPRESSION: Increasing atelectasis in the left lung base with persistent linear atelectasis in the mid lungs bilaterally. Electronically Signed   By: Lucienne Capers M.D.   On: 11/25/2015 00:14   Dg Abd Portable  1v  11/25/2015  CLINICAL DATA:  Feeding tube placement. EXAM: PORTABLE ABDOMEN - 1 VIEW COMPARISON:  11/21/2015 FINDINGS: Enteric tube tip is across the midline in the right upper quadrant consistent with location in the distal stomach or proximal duodenum. Motion artifact limits examination. IMPRESSION: Enteric tube tip is in the right upper quadrant consistent with location in the distal stomach or proximal duodenum. Electronically Signed   By: Lucienne Capers M.D.   On: 11/25/2015 01:19    Cardiac studies:   ECG:   Medications:   Scheduled Medications: . antiseptic oral rinse  7 mL Mouth Rinse QID  . aspirin  81 mg Oral Daily  . atorvastatin  80 mg Oral q1800  . aztreonam  1 g Intravenous 3 times per day  . chlorhexidine gluconate  15 mL Mouth Rinse BID  . enoxaparin (LOVENOX) injection  40 mg Subcutaneous Q24H  . famotidine  20 mg Oral BID  . fluticasone  2 spray Each Nare Daily  . guaiFENesin  15 mL Per Tube 6 times per day  . insulin aspart  2-6 Units Subcutaneous 6 times per day  . oxymetazoline  1 spray Each Nare BID  . sodium chloride  3 mL Intravenous Q12H  . tamsulosin  0.4 mg Oral Daily  . ticagrelor  90 mg Oral BID  . traZODone  100 mg Oral QHS  . vancomycin  500 mg Oral 4 times per day    Infusions: . sodium chloride 10 mL/hr at 11/25/15 1200  . feeding supplement (VITAL AF 1.2 CAL) 1,000 mL (11/25/15 2023)  . fentaNYL infusion INTRAVENOUS 200 mcg/hr (11/26/15 0017)  . phenylephrine (NEO-SYNEPHRINE) Adult infusion 100 mcg/min (11/26/15 0017)    PRN Medications: sodium chloride, acetaminophen, fentaNYL (SUBLIMAZE) injection, fentaNYL (SUBLIMAZE) injection, haloperidol lactate, midazolam, midazolam, ondansetron (ZOFRAN) IV, RESOURCE THICKENUP CLEAR, sodium chloride, sodium chloride, sodium chloride   Assessment and Plan:   71 YOM admitted with out of hospital Vfib arrest in setting of anterior STEMI now s/p LAD PCI. Now with c.diff and reintubated.     Ventricular fibrillation arrest In setting of anterior STEMI, s/p DES to LAD -holding coreg and ACEi d/t soft BP  STEMI S/p DES to LAD.  -cont ASA 81 and brilinta  -cont atorvastatin 80mg  qd  Acute systolic CHF EF initially 25% on LV-gram, improved to 50-55% after intervention.  -cont ramipril 2.5 daily and coreg per above  HCAP -per PCCM, on vancomycin and aztreonam  C diff Leukocytosis worsening.  -on vancomycin   Jones Bales, MD PGY-3, Internal Medicine Teaching Service 11/26/2015, 6:23 AM   I have examined the patient and reviewed assessment and plan and discussed with patient.  Agree with above as stated.  BP low likely due to sedation for vent.  Had been stable from a cardiac standpoint before reintubation.  Restart Coreg after patient extubated and BP increases.  VARANASI,JAYADEEP S.

## 2015-11-26 NOTE — Progress Notes (Signed)
Pharmacy Antibiotic Follow-up Note  [name] is a 69 y.o. year-old male admitted on 11/11/2015.  The patient is currently on day 6 of aztreonam for pneumonia. Also is on day 2 of PO vanc and day 1 of IV flagyl for Cdiff.  Assessment/Plan: After discussion with Dr. Nelda Marseille, continue Aztreonam 1 gm IV q8h until 12/16 to complete an 8 day course. Due to worsening diarrhea and WBC, flagyl 500 mg IV q8h was added for Cdiff coverage. Continue vanc PO 500 mg q6h.  Temp (24hrs), Avg:99.2 F (37.3 C), Min:98.9 F (37.2 C), Max:99.9 F (37.7 C)   Recent Labs Lab 11/22/15 0431 11/23/15 0331 11/24/15 0319 11/25/15 0258 11/26/15 0352  WBC 21.1* 26.1* 29.8* 29.7* 37.5*     Recent Labs Lab 11/22/15 0431 11/23/15 0331 11/24/15 0319 11/25/15 0258 11/26/15 0352  CREATININE 0.94 1.00 0.98 1.70* 1.81*   Estimated Creatinine Clearance: 36.5 mL/min (by C-G formula based on Cr of 1.81).    Allergies  Allergen Reactions  . Contrast Media [Iodinated Diagnostic Agents] Hives    03/28/14 pt received 1 hour emergent prep and pt did good and had no complaints after scan.  . Ibuprofen     REACTION: unspecified  . Penicillins     REACTION: unspecified  . Sulfamethoxazole     REACTION: unspecified    Antimicrobials this admission: Levaquin 11/30 >> 12/4 Vancomycin 12/9 >> 12/11 Aztreonam 12/11 >> (stop date entered for 12/16) Vanc PO 12/12 >> Flagyl 12/14 >>  Levels/dose changes this admission: none  Microbiology results: 12/9 Sputum: negative  11/30 MRSA PCR: negative 12/12: Cdiff toxin positive  Thank you for allowing pharmacy to be a part of this patient's care.  Joya San, PharmD Clinical Pharmacy Resident Pager # [phone] 11/26/2015 11:14 AM

## 2015-11-26 NOTE — Progress Notes (Signed)
   11/26/15 1500  Clinical Encounter Type  Visited With Family  Visit Type Follow-up;Psychological support  Spiritual Encounters  Spiritual Needs Emotional;Grief support  [facility] continued follow-up with pt and family; pt still in distress causing family angst; [place] will continue to monitor and support. Gwynn Burly 3:08 PM

## 2015-11-27 ENCOUNTER — Inpatient Hospital Stay (HOSPITAL_COMMUNITY): Payer: BLUE CROSS/BLUE SHIELD

## 2015-11-27 DIAGNOSIS — A047 Enterocolitis due to Clostridium difficile: Secondary | ICD-10-CM

## 2015-11-27 LAB — BASIC METABOLIC PANEL
Anion gap: 5 (ref 5–15)
BUN: 43 mg/dL — AB (ref 6–20)
CALCIUM: 7.3 mg/dL — AB (ref 8.9–10.3)
CO2: 23 mmol/L (ref 22–32)
CREATININE: 1.05 mg/dL (ref 0.61–1.24)
Chloride: 120 mmol/L — ABNORMAL HIGH (ref 101–111)
Glucose, Bld: 216 mg/dL — ABNORMAL HIGH (ref 65–99)
Potassium: 3.8 mmol/L (ref 3.5–5.1)
SODIUM: 148 mmol/L — AB (ref 135–145)

## 2015-11-27 LAB — BLOOD GAS, ARTERIAL
Acid-base deficit: 2.7 mmol/L — ABNORMAL HIGH (ref 0.0–2.0)
Bicarbonate: 21.6 mEq/L (ref 20.0–24.0)
DRAWN BY: 44166
FIO2: 0.4
LHR: 12 {breaths}/min
MECHVT: 550 mL
O2 Saturation: 97.6 %
PATIENT TEMPERATURE: 98.6
PCO2 ART: 37.5 mmHg (ref 35.0–45.0)
PEEP: 5 cmH2O
PO2 ART: 102 mmHg — AB (ref 80.0–100.0)
TCO2: 22.8 mmol/L (ref 0–100)
pH, Arterial: 7.379 (ref 7.350–7.450)

## 2015-11-27 LAB — GLUCOSE, CAPILLARY
GLUCOSE-CAPILLARY: 190 mg/dL — AB (ref 65–99)
GLUCOSE-CAPILLARY: 203 mg/dL — AB (ref 65–99)
Glucose-Capillary: 192 mg/dL — ABNORMAL HIGH (ref 65–99)
Glucose-Capillary: 199 mg/dL — ABNORMAL HIGH (ref 65–99)
Glucose-Capillary: 165 mg/dL — ABNORMAL HIGH (ref 65–99)
Glucose-Capillary: 120 mg/dL — ABNORMAL HIGH (ref 65–99)

## 2015-11-27 LAB — CBC
HCT: 38.4 % — ABNORMAL LOW (ref 39.0–52.0)
Hemoglobin: 12.1 g/dL — ABNORMAL LOW (ref 13.0–17.0)
MCH: 30.6 pg (ref 26.0–34.0)
MCHC: 31.5 g/dL (ref 30.0–36.0)
MCV: 97.2 fL (ref 78.0–100.0)
PLATELETS: 317 10*3/uL (ref 150–400)
RBC: 3.95 MIL/uL — ABNORMAL LOW (ref 4.22–5.81)
RDW: 15.9 % — ABNORMAL HIGH (ref 11.5–15.5)
WBC: 25.1 10*3/uL — ABNORMAL HIGH (ref 4.0–10.5)

## 2015-11-27 LAB — MAGNESIUM: MAGNESIUM: 2.7 mg/dL — AB (ref 1.7–2.4)

## 2015-11-27 LAB — PHOSPHORUS: Phosphorus: 1.7 mg/dL — ABNORMAL LOW (ref 2.5–4.6)

## 2015-11-27 MED ORDER — CETYLPYRIDINIUM CHLORIDE 0.05 % MT LIQD
7.0000 mL | Freq: Two times a day (BID) | OROMUCOSAL | Status: DC
  Administered 2015-11-27: 7 mL via OROMUCOSAL

## 2015-11-27 MED ORDER — CHLORHEXIDINE GLUCONATE 0.12 % MT SOLN
15.0000 mL | Freq: Two times a day (BID) | OROMUCOSAL | Status: DC
  Administered 2015-11-27: 15 mL via OROMUCOSAL

## 2015-11-27 MED ORDER — POTASSIUM PHOSPHATES 15 MMOLE/5ML IV SOLN
30.0000 mmol | Freq: Once | INTRAVENOUS | Status: AC
  Administered 2015-11-27: 30 mmol via INTRAVENOUS
  Filled 2015-11-27: qty 10

## 2015-11-27 MED ORDER — CETYLPYRIDINIUM CHLORIDE 0.05 % MT LIQD
7.0000 mL | Freq: Two times a day (BID) | OROMUCOSAL | Status: DC
  Administered 2015-11-27 – 2015-12-02 (×10): 7 mL via OROMUCOSAL

## 2015-11-27 MED ORDER — FREE WATER
300.0000 mL | Freq: Four times a day (QID) | Status: DC
  Administered 2015-11-27: 300 mL

## 2015-11-27 NOTE — Progress Notes (Signed)
PULMONARY / CRITICAL CARE MEDICINE   Name: [name] MRN: [id number] DOB: 1946/10/14    ADMISSION DATE:  11/11/2015 CONSULTATION DATE:  11/11/2015  REFERRING MD:  Dr. Burt Knack  CHIEF COMPLAINT:  Cardiac arrest  HISTORY OF PRESENT ILLNESS:  69 year old male with PMH of RA and HTN. He presented to [facility] ED 11/29 after a witnessed cardiac arrest at home. He has had intermittent chest and arm pain for about 2 weeks status post mechanical fall. 11/29 chest pain acutely worsened and he unfortunately suffered a cardiac arrest, which was witnessed by his wife and EMS was called. Family initiated CPR. EMS arrived and AED was placed advising shock. 2 shocks were delivered and ROSC was achieved. 2 rounds epinephrine also administerred. EKG in the field was concerning for STEMI and code STEMI was called. In ED patient was unresponsive, he was intubated and taken emergently to cardiac cath lab where he remains at this time. Occlusive LAD lesion identified and intervened upon. PCCM consulted for ICU care. Total downtime estimated at less than 15 mins.   SUBJECTIVE / Interval events: No events overnight, awake this AM.  VITAL SIGNS: BP 108/77 mmHg  Pulse 108  Temp(Src) 99 F (37.2 C) (Oral)  Resp 18  Ht 5\' 7"  (1.702 m)  Wt 81 kg (178 lb 9.2 oz)  BMI 27.96 kg/m2  SpO2 96% 40% high flow  HEMODYNAMICS: CVP:  [3 mmHg-9 mmHg] 6 mmHg  Vent Mode:  [-] PSV;CPAP FiO2 (%):  [40 %] 40 % Set Rate:  [12 bmp] 12 bmp Vt Set:  [550 mL] 550 mL PEEP:  [5 cmH20] 5 cmH20 Pressure Support:  [5 cmH20-8 cmH20] 5 cmH20  INTAKE / OUTPUT: I/O last 3 completed shifts: In: 8371.4 [I.V.:4488.2; Other:40; NG/GT:3093.2; IV Piggyback:750] Out: [id number] [Urine:3270; Stool:780]  PHYSICAL EXAMINATION: General: Ill appearing, sedated and intubated but withdraws all ext to pain. Neuro: Withdraws to pain but not interactive, on sedation. HEENT: Moist mucous membranes, [place]/AT, PERRL, EOM-I. Cardiovascular: S1-S2,  regular rate and rhythm, no murmurs rubs gallops  Lungs: Coarse BS diffusely. Abdomen: Somewhat distended, positive bowel sounds  Skin: Intact.  -edema   LABS:  CBC  Recent Labs Lab 11/25/15 0258 11/26/15 0352 11/27/15 0407  WBC 29.7* 37.5* 25.1*  HGB 16.1 13.7 12.1*  HCT 49.1 42.1 38.4*  PLT 435* 398 317   Coag's No results for input(s): APTT, INR in the last 168 hours. BMET  Recent Labs Lab 11/25/15 0258 11/26/15 0352 11/27/15 0407  NA 149* 145 148*  K 4.1 3.4* 3.8  CL 116* 115* 120*  CO2 20* 22 23  BUN 72* 84* 43*  CREATININE 1.70* 1.81* 1.05  GLUCOSE 130* 263* 216*   Electrolytes  Recent Labs Lab 11/24/15 0319 11/25/15 0258 11/26/15 0352 11/27/15 0407  CALCIUM 8.6* 8.3* 7.5* 7.3*  MG 2.5*  --  2.9* 2.7*  PHOS 3.4  --  5.1* 1.7*   Sepsis Markers  Recent Labs Lab 11/20/15 1059 11/21/15 0438 11/22/15 0431  PROCALCITON 0.45 0.32 0.24   ABG  Recent Labs Lab 11/25/15 0533 11/26/15 0335 11/27/15 0500  PHART 7.493* 7.396 7.379  PCO2ART 25.4* 35.0 37.5  PO2ART 72.0* 78.0* 102*   Liver Enzymes  Recent Labs Lab 11/22/15 0431 11/23/15 0331 11/24/15 0319  ALBUMIN 2.5* 2.5* 2.6*   Cardiac Enzymes No results for input(s): TROPONINI, PROBNP in the last 168 hours. Glucose  Recent Labs Lab 11/26/15 1227 11/26/15 1658 11/26/15 1946 11/26/15 2012 11/26/15 2351 11/27/15 0343  GLUCAP 125*  188* 190* 225* 203* 192*   Imaging Dg Chest Port 1 View  11/27/2015  CLINICAL DATA:  Intubation. EXAM: PORTABLE CHEST 1 VIEW COMPARISON:  11/26/2015. FINDINGS: Endotracheal tube, NG tube, right IJ line in stable position. Heart size stable. Persistent bibasilar infiltrates. No pleural effusion or pneumothorax. IMPRESSION: 1. Lines tubes in stable position. 2. Persistent bibasilar infiltrates.  No significant interim change. Electronically Signed   By: Marcello Moores  Register   On: 11/27/2015 07:12   Basilar L>R atx  STUDIES:  December 08, 2023  LHC - stent lad 12-08-2023  CT  head - no acute abnormality 11/30  eeg - no seizure activity. suppression 12/03  Port CXR 12/3 - Bilat lower lung opacity.  CULTURES: Resp 12/9 >> normal flora C diff 12/12 >> positive   ANTIBIOTICS: Levaquin 11/30 >> 12/4 Vanco 12/9  >> > stop date Aztreonam 12/9 >> Vanco PO 12/12 >>  Flagyl 12/14>>>  SIGNIFICANT EVENTS: 12/08/23 cardiac arrest > cath lab, out to ICU, therapeutic hypothermia.  12/02 completed rewarming 12/9 Extubated 12/10 getting OOB. Still weak, kept in ICU for intensive pulmonary toilet. Added decadron for Laryngeal edema.  12/11 remarkably better. Phonation improved. Getting OOB.  12/13 reintubated for laryngeal edema.  LINES/TUBES: ETT 12/08/23 >>>12/8>>>12/13>>> CVL 12/08/23 >>>12/9 Left radial 2023-12-08 >>>12/3  ASSESSMENT / PLAN:  PULMONARY A: Recurrent Acute Hypoxic Respiratory Failure, multifactorial due to overall illness + C diff colitis / dehydration + altered Severe CAP vs Aspiration Pneumonia  HCAP Probable post-intubation Laryngeal edema   P: SBT today but no extubation until more hemodynamically stable. Adjust vent for ABG. Titrate O2 for sat of 88-92%. See ID section.  CARDIOVASCULAR A:  Cardiac arrest - VT/VF STEMI s/p PCI December 08, 2023 ST HTN ICM w ef 25%-->now 50-55% after intervention   P:  Care per Cardiology-->hold all anti-HTN Neo for BP support. Asa, Brilinta, Lopressor & Lipitor > will place OGT to allow him to receive   RENAL A:   Hypernatremia. Worsening renal function, dehydrated.  P:   Monitor UOP / BMP. Replace electrolytes as indicated. KVO IVF. Free water 300 q6.  GASTROINTESTINAL A:   Transaminitis - Shock liver. Resolving. At Risk Protein Calorie Malnutrition  Post intubation dysphagia  P:   Continue TF per nutrition. Pepcid IV q12hr.  HEMATOLOGIC A:   Leukocytosis - Improving. Suspect stress response & exacerbated by steroids Anemia  P:  Follow CBC. Antiplatelet / anticoagulation per  Cardiology. Heparin Disautel q8hr for DVT prophylaxis.  INFECTIOUS A:   Possible CAP HCAP C diff colitis positive  P:   Broad-spectrum antibiotics for HCAP started 12/9, continue aztreonam and PO vancomycin (for c diff), continue for now. Monitor fever curve/WBC. Enteral vanco started 12/12. Add flagyl due to WBC increase.  ENDOCRINE A:   Hyperglycemia - No H/O DM.  P:   ISS q4. SSI coverage.  AUTOIMMUNE:  A:  Rheumatoid Arthritis - on DMARD & Methotrexate at baseline   P:  Hold home medications  NEUROLOGIC A:   Deconditioning s/p critical illness  P:   Stop trazodone. GTT fentanyl and versed pushes.  FAMILY - Updates: No family bedside.   The patient is critically ill with multiple organ systems failure and requires high complexity decision making for assessment and support, frequent evaluation and titration of therapies, application of advanced monitoring technologies and extensive interpretation of multiple databases.   Critical Care Time devoted to patient care services described in this note is  45  Minutes. This time reflects time of care of this signee Dr  Jennet Maduro. This critical care time does not reflect procedure time, or teaching time or supervisory time of PA/NP/Med student/Med Resident etc but could involve care discussion time.  Rush Farmer, M.D. [facility] Pulmonary/Critical Care Medicine. Pager: [phone]. After hours pager: [phone].

## 2015-11-27 NOTE — Progress Notes (Signed)
eLink Physician-Brief Progress Note Patient Name: [name] DOB: 16-Apr-1946 MRN: [id number]   Date of Service  11/27/2015  HPI/Events of Note  Off all dextrose and tf cbg's staying below 150  eICU Interventions  Stop all insulin for now and re-write it when either starts tf or po intake      Intervention Category Major Interventions: Hyperglycemia - active titration of insulin therapy  Christinia Gully 11/27/2015, 8:23 PM

## 2015-11-27 NOTE — Progress Notes (Signed)
Patient CBG at 1600 was 120. Patient extubated this afternoon and tube feeds discontinued. Waiting for speech therapy consult for diet orders. Paged Carmel regarding standing orders for 4units of insulin every 4 hours due at 1645. Received orders to hold 1645 insulin dose and to call back after getting the CBG at 2000. Will continue to monitor and make night nurse aware.

## 2015-11-27 NOTE — Procedures (Signed)
Extubation Procedure Note  Patient Details:   Name: [name] DOB: 08-30-1946 MRN: [id number]   Airway Documentation:     Evaluation  O2 sats: stable throughout Complications: No apparent complications Patient did tolerate procedure well. Bilateral Breath Sounds: Clear, Diminished Suctioning: Oral, Airway Yes  Patient tolerated wean. MD ordered to extubate. Positive for cuff leak. Patient extubated to a 4 Lpm nasal cannula. No signs of stridor. Patient instructed on the Incentive Spirometer, achieving 600 mL, five times. RN at bedside. Will continue to monitor.   [name] 11/27/2015, 12:38 PM

## 2015-11-27 NOTE — Progress Notes (Signed)
Subjective:    Day of hospitalization: 16   Intubated.  Diarrhea continues with improving leukocytosis.     Objective:   Temp:  [98.6 F (37 C)-99.1 F (37.3 C)] 99 F (37.2 C) (12/15 0800) Pulse Rate:  [103-112] 103 (12/15 0800) Resp:  [13-23] 16 (12/15 0800) BP: (80-108)/(47-77) 108/77 mmHg (12/15 0800) SpO2:  [91 %-99 %] 97 % (12/15 0800) FiO2 (%):  [40 %] 40 % (12/15 0800) Weight:  [81 kg (178 lb 9.2 oz)] 81 kg (178 lb 9.2 oz) (12/15 0200) Last BM Date: 11/26/15  Filed Weights   11/25/15 0530 11/26/15 0321 11/27/15 0200  Weight: 79.8 kg (175 lb 14.8 oz) 79.3 kg (174 lb 13.2 oz) 81 kg (178 lb 9.2 oz)    Intake/Output Summary (Last 24 hours) at 11/27/15 0829 Last data filed at 11/27/15 0800  Gross per 24 hour  Intake 6468.22 ml  Output   3145 ml  Net 3323.22 ml    Physical Exam: General: Intubated.  Lungs: CTAB.  Cardiac: tachycardic, no m/r/g. Abdomen: +BS, mild distention.  Extremities: No LE edema.   Lab Results:  Basic Metabolic Panel:  Recent Labs Lab 11/24/15 0319 11/25/15 0258 11/26/15 0352 11/27/15 0407  NA 148* 149* 145 148*  K 3.7 4.1 3.4* 3.8  CL 113* 116* 115* 120*  CO2 27 20* 22 23  GLUCOSE 181* 130* 263* 216*  BUN 40* 72* 84* 43*  CREATININE 0.98 1.70* 1.81* 1.05  CALCIUM 8.6* 8.3* 7.5* 7.3*  MG 2.5*  --  2.9* 2.7*    Liver Function Tests:  Recent Labs Lab 11/22/15 0431 11/23/15 0331 11/24/15 0319  ALBUMIN 2.5* 2.5* 2.6*    CBC:  Recent Labs Lab 11/25/15 0258 11/26/15 0352 11/27/15 0407  WBC 29.7* 37.5* 25.1*  HGB 16.1 13.7 12.1*  HCT 49.1 42.1 38.4*  MCV 97.6 96.8 97.2  PLT 435* 398 317    Cardiac Enzymes: No results for input(s): CKTOTAL, CKMB, CKMBINDEX, TROPONINI in the last 168 hours.  BNP: No results for input(s): PROBNP in the last 8760 hours.  Coagulation: No results for input(s): INR in the last 168 hours.  Radiology: Dg Chest Port 1 View  11/27/2015  CLINICAL DATA:  Intubation. EXAM:  PORTABLE CHEST 1 VIEW COMPARISON:  11/26/2015. FINDINGS: Endotracheal tube, NG tube, right IJ line in stable position. Heart size stable. Persistent bibasilar infiltrates. No pleural effusion or pneumothorax. IMPRESSION: 1. Lines tubes in stable position. 2. Persistent bibasilar infiltrates.  No significant interim change. Electronically Signed   By: Marcello Moores  Register   On: 11/27/2015 07:12   Dg Chest Port 1 View  11/26/2015  CLINICAL DATA:  Intubated patient, respiratory failure, acute MI with cardiac arrest, sepsis EXAM: PORTABLE CHEST 1 VIEW COMPARISON:  Portable chest x-ray of November 25, 2015 FINDINGS: Patient positioning has changed. The lungs are reasonably well inflated. There is persistent airspace opacity in the right perihilar and infrahilar region on the right. Increased airspace opacity on the left in the perihilar region has developed. There is no pleural effusion or pneumothorax. The cardiac silhouette remains enlarged. The central pulmonary vascularity is mildly prominent. The endotracheal tube tip lies 3.3 cm above the carina. The esophagogastric tube tip projects below the inferior margin of the image. The right internal jugular venous catheter tip projects over the midportion of the SVC. IMPRESSION: Interval worsening of perihilar airspace opacities consistent with atelectasis or pneumonia. Mild central pulmonary vascular prominence is present as well. The support tubes are in reasonable position.  Electronically Signed   By: David  Martinique M.D.   On: 11/26/2015 07:09   Dg Chest Port 1 View  11/25/2015  CLINICAL DATA:  Central line placement, endotracheal tube placement EXAM: PORTABLE CHEST 1 VIEW COMPARISON:  11/25/2015 FINDINGS: Cardiomediastinal silhouette is stable. Stable endotracheal and NG tube position. Persistent streaky atelectasis or infiltrate right base medially. There is right IJ central line with tip in SVC. No pneumothorax. IMPRESSION: Stable endotracheal and NG tube  position. Persistent streaky atelectasis or infiltrate right base medially. There is right IJ central line with tip in SVC. No pneumothorax. Electronically Signed   By: Lahoma Crocker M.D.   On: 11/25/2015 10:48    Cardiac studies:   ECG:   Medications:   Scheduled Medications: . antiseptic oral rinse  7 mL Mouth Rinse QID  . aspirin  81 mg Oral Daily  . atorvastatin  80 mg Oral q1800  . aztreonam  1 g Intravenous 3 times per day  . chlorhexidine gluconate  15 mL Mouth Rinse BID  . enoxaparin (LOVENOX) injection  40 mg Subcutaneous Q24H  . famotidine  20 mg Oral BID  . fluticasone  2 spray Each Nare Daily  . guaiFENesin  15 mL Per Tube 6 times per day  . insulin aspart  2-6 Units Subcutaneous 6 times per day  . insulin aspart  4 Units Subcutaneous Q4H  . metronidazole  500 mg Intravenous Q8H  . oxymetazoline  1 spray Each Nare BID  . sodium chloride  10-40 mL Intracatheter Q12H  . tamsulosin  0.4 mg Oral Daily  . ticagrelor  90 mg Oral BID  . traZODone  100 mg Oral QHS  . vancomycin  500 mg Oral 4 times per day    Infusions: . sodium chloride 10 mL/hr at 11/27/15 0700  . sodium chloride 100 mL/hr at 11/27/15 0700  . feeding supplement (VITAL AF 1.2 CAL) 1,000 mL (11/27/15 0700)  . fentaNYL infusion INTRAVENOUS Stopped (11/27/15 0820)  . phenylephrine (NEO-SYNEPHRINE) Adult infusion 95 mcg/min (11/27/15 0700)    PRN Medications: acetaminophen, fentaNYL (SUBLIMAZE) injection, fentaNYL (SUBLIMAZE) injection, Gerhardt's butt cream, midazolam, ondansetron (ZOFRAN) IV, sodium chloride   Assessment and Plan:   69 YOM admitted with out of hospital Vfib arrest in setting of anterior STEMI now s/p LAD PCI. Now with c.diff and reintubated.    S/p Ventricular fibrillation arrest In setting of anterior STEMI, s/p DES to LAD -holding coreg and ACEi d/t soft BP  STEMI S/p DES to LAD.  -cont ASA 81 and brilinta  -cont atorvastatin 80mg  qd  Acute systolic CHF EF initially 25%  on LV-gram, improved to 50-55% after intervention.  -holding ramipril and coreg per above  HCAP -per PCCM, on vancomycin and aztreonam  C diff Leukocytosis now improving.  -on vancomycin and now flagyl for increased leukocytosis   Jones Bales, MD PGY-3, Internal Medicine Teaching Service 11/27/2015, 8:29 AM     I have examined the patient and reviewed assessment and plan and discussed with patient.  Agree with above as stated.  Weaning pressors.  Hoping to extubate later today.  He is awake.  Treating C.Diff diarrhea. Restart beta blocker when BP improves.  Lemarcus Baggerly S.

## 2015-11-27 NOTE — Progress Notes (Signed)
abg collected  

## 2015-11-27 NOTE — Progress Notes (Signed)
123ml of fentanyl wasted in sink witnessed by Alben Deeds, RN and [employer], Therapist, sports.

## 2015-11-28 ENCOUNTER — Inpatient Hospital Stay (HOSPITAL_COMMUNITY): Payer: BLUE CROSS/BLUE SHIELD

## 2015-11-28 LAB — BASIC METABOLIC PANEL
Anion gap: 5 (ref 5–15)
BUN: 25 mg/dL — AB (ref 6–20)
CALCIUM: 7.2 mg/dL — AB (ref 8.9–10.3)
CO2: 23 mmol/L (ref 22–32)
Chloride: 120 mmol/L — ABNORMAL HIGH (ref 101–111)
Creatinine, Ser: 0.89 mg/dL (ref 0.61–1.24)
GFR calc Af Amer: >60 mL/min (ref >60)
GLUCOSE: 138 mg/dL — AB (ref 65–99)
Potassium: 3.5 mmol/L (ref 3.5–5.1)
Sodium: 148 mmol/L — ABNORMAL HIGH (ref 135–145)

## 2015-11-28 LAB — GLUCOSE, CAPILLARY
GLUCOSE-CAPILLARY: 135 mg/dL — AB (ref 65–99)
GLUCOSE-CAPILLARY: 146 mg/dL — AB (ref 65–99)
Glucose-Capillary: 126 mg/dL — ABNORMAL HIGH (ref 65–99)
Glucose-Capillary: 129 mg/dL — ABNORMAL HIGH (ref 65–99)
Glucose-Capillary: 179 mg/dL — ABNORMAL HIGH (ref 65–99)

## 2015-11-28 LAB — BLOOD GAS, ARTERIAL
Acid-base deficit: 0.9 mmol/L (ref 0.0–2.0)
Bicarbonate: 21.7 mEq/L (ref 20.0–24.0)
Drawn by: 441661
O2 Content: 4 L/min
O2 SAT: 96.1 %
PCO2 ART: 26.7 mmHg — AB (ref 35.0–45.0)
PO2 ART: 76.9 mmHg — AB (ref 80.0–100.0)
Patient temperature: 98.6
TCO2: 22.5 mmol/L (ref 0–100)
pH, Arterial: 7.521 — ABNORMAL HIGH (ref 7.350–7.450)

## 2015-11-28 LAB — CBC
HCT: 35.9 % — ABNORMAL LOW (ref 39.0–52.0)
Hemoglobin: 12.1 g/dL — ABNORMAL LOW (ref 13.0–17.0)
MCH: 31.9 pg (ref 26.0–34.0)
MCHC: 33.7 g/dL (ref 30.0–36.0)
MCV: 94.7 fL (ref 78.0–100.0)
PLATELETS: 325 10*3/uL (ref 150–400)
RBC: 3.79 MIL/uL — ABNORMAL LOW (ref 4.22–5.81)
RDW: 15.7 % — AB (ref 11.5–15.5)
WBC: 19.2 10*3/uL — ABNORMAL HIGH (ref 4.0–10.5)

## 2015-11-28 LAB — PHOSPHORUS: Phosphorus: 1.4 mg/dL — ABNORMAL LOW (ref 2.5–4.6)

## 2015-11-28 LAB — MAGNESIUM: Magnesium: 2.5 mg/dL — ABNORMAL HIGH (ref 1.7–2.4)

## 2015-11-28 MED ORDER — ENSURE ENLIVE PO LIQD
237.0000 mL | Freq: Two times a day (BID) | ORAL | Status: DC
  Administered 2015-11-29 – 2015-12-02 (×6): 237 mL via ORAL

## 2015-11-28 MED ORDER — POTASSIUM PHOSPHATES 15 MMOLE/5ML IV SOLN
30.0000 mmol | Freq: Once | INTRAVENOUS | Status: AC
  Administered 2015-11-28: 30 mmol via INTRAVENOUS
  Filled 2015-11-28: qty 10

## 2015-11-28 NOTE — Progress Notes (Signed)
abg collected  

## 2015-11-28 NOTE — Care Management Important Message (Signed)
Important Message  Patient Details  Name: [name] MRN: [id number] Date of Birth: [birth year]   Medicare Important Message Given:  Yes    Lacretia Leigh, RN 11/28/2015, 9:26 AM

## 2015-11-28 NOTE — Progress Notes (Addendum)
Nutrition Follow-up   INTERVENTION:   Ensure Enlive po BID, each supplement provides 350 kcal and 20 grams of protein  NUTRITION DIAGNOSIS:   Inadequate oral intake related to dysphagia (weakness) as evidenced by meal completion < 50%. Ongoing.   GOAL:   Patient will meet greater than or equal to 90% of their needs Not met.   MONITOR:   PO intake, Supplement acceptance, Diet advancement, Labs, Weight trends, Skin, I & O's  ASSESSMENT:   69 y.o. male w/ PMHx significant for rheumatoid arthritis and HTN who presented to [facility] on 11/11/2015 after an out-of-hospital cardiac arrest. The patient reportedly had chest and arm pain intermittently for 2 weeks after sustaining a mechanical fall. Today he had worsening of his chest pain and he ultimately arrested at home, witnessed by his wife.  Pt extubated 12/16. Pt very weak. Pt is lactose intolerant.  Passed swallow eval and started on Dysphagia 1 diet with Nectar thickened liquids Labs reviewed: sodium elevated (148), phosphorus low (1.4), magnesium elevated (2.5) CBG's: 126-129   Diet Order:  DIET - DYS 1 Room service appropriate?: Yes; Fluid consistency:: Nectar Thick  Skin:  Wound (see comment) (MASD on buttocks)  Last BM:  12/15 450 ml via rectal pouch (inserted 12/13)  Height:   Ht Readings from Last 1 Encounters:  11/18/15 _0  (1.702 m)   Weight:   Wt Readings from Last 1 Encounters:  11/28/15 179 lb 10.8 oz (81.5 kg)   Ideal Body Weight:  67.27 kg  BMI:  Body mass index is 28.13 kg/(m^2).  Estimated Nutritional Needs:   Kcal:  1900-2100  Protein:  95-110 grams  Fluid:  > 1.9 L/day  EDUCATION NEEDS:   No education needs identified at this time  [place], [place], [place] Pager [phone] After Hours Pager

## 2015-11-28 NOTE — Progress Notes (Signed)
MBSS complete. Full report located under chart review in imaging section.  Beverlyann Broxterman Paiewonsky, M.A. CCC-SLP [phone]  

## 2015-11-28 NOTE — Progress Notes (Signed)
SUBJECTIVE:  Hoarse after extubation  OBJECTIVE:   Vitals:   Filed Vitals:   11/28/15 0800 11/28/15 0900 11/28/15 1000 11/28/15 1200  BP: 96/47 101/57 108/66 109/59  Pulse:  97 96 101  Temp: 97.8 F (36.6 C)   98 F (36.7 C)  TempSrc: Oral   Oral  Resp: 30 30 28 30   Height:      Weight:      SpO2: 95% 95% 96% 96%   I&O's:   Intake/Output Summary (Last 24 hours) at 11/28/15 1332 Last data filed at 11/28/15 1300  Gross per 24 hour  Intake 1584.75 ml  Output   2055 ml  Net -470.25 ml   TELEMETRY: Reviewed telemetry pt in sinus tach:     PHYSICAL EXAM General: Frail Head:   Normal cephalic and atramatic  Lungs:   Coarse Breath sounds bilaterally to auscultation. Heart:   HRRR S1 S2  No JVD.   Abdomen: abdomen soft and non-tender Msk:  Back normal,  Normal strength and tone for age. Extremities:   No edema.   Neuro: Alert and oriented. Psych:  Normal affect, responds appropriately Skin: No rash   LABS: Basic Metabolic Panel:  Recent Labs  11/27/15 0407 11/28/15 0430  NA 148* 148*  K 3.8 3.5  CL 120* 120*  CO2 23 23  GLUCOSE 216* 138*  BUN 43* 25*  CREATININE 1.05 0.89  CALCIUM 7.3* 7.2*  MG 2.7* 2.5*  PHOS 1.7* 1.4*   Liver Function Tests: No results for input(s): AST, ALT, ALKPHOS, BILITOT, PROT, ALBUMIN in the last 72 hours. No results for input(s): LIPASE, AMYLASE in the last 72 hours. CBC:  Recent Labs  11/27/15 0407 11/28/15 0430  WBC 25.1* 19.2*  HGB 12.1* 12.1*  HCT 38.4* 35.9*  MCV 97.2 94.7  PLT 317 325   Cardiac Enzymes: No results for input(s): CKTOTAL, CKMB, CKMBINDEX, TROPONINI in the last 72 hours. BNP: Invalid input(s): POCBNP D-Dimer: No results for input(s): DDIMER in the last 72 hours. Hemoglobin A1C: No results for input(s): HGBA1C in the last 72 hours. Fasting Lipid Panel: No results for input(s): CHOL, HDL, LDLCALC, TRIG, CHOLHDL, LDLDIRECT in the last 72 hours. Thyroid Function Tests: No results for input(s):  TSH, T4TOTAL, T3FREE, THYROIDAB in the last 72 hours.  Invalid input(s): FREET3 Anemia Panel: No results for input(s): VITAMINB12, FOLATE, FERRITIN, TIBC, IRON, RETICCTPCT in the last 72 hours. Coag Panel:   Lab Results  Component Value Date   INR 1.11 11/12/2015   INR 1.19 11/12/2015    RADIOLOGY: Dg Chest 1 View  11/19/2015  CLINICAL DATA:  69 year old male status post V fib arrest. Respiratory failure. Initial encounter. EXAM: CHEST 1 VIEW COMPARISON:  11/18/2015 and earlier. FINDINGS: Portable AP semi upright view at 1007 hours. Endotracheal tube tip remains in good position between the clavicles and carina. Enteric tube courses to the abdomen, tip not included. Stable left IJ central line. Improved right lung base ventilation, regressed opacity. Continued dense left lung base/retrocardiac opacity with some air bronchograms. No pneumothorax or pulmonary edema. Probable small pleural effusions. Stable cardiac size and mediastinal contours. IMPRESSION: 1.  Stable lines and tubes. 2. Interval improved right lung base ventilation. Residual left lower lobe collapse/consolidation. Probable small effusions. Electronically Signed   By: Genevie Ann M.D.   On: 11/19/2015 10:15   Dg Abd 1 View  11/21/2015  CLINICAL DATA:  Feeding tube placement EXAM: ABDOMEN - 1 VIEW COMPARISON:  November 18, 2015 FINDINGS: Nasogastric tube no longer present.  Feeding tube tip is at the level of the first portion of the duodenum. The bowel gas pattern is unremarkable. There is moderate stool in the colon. IMPRESSION: Feeding tube tip at the level of the proximal duodenum. Bowel gas pattern unremarkable. Electronically Signed   By: Lowella Grip III M.D.   On: 11/21/2015 17:02   Ct Head Wo Contrast  11/11/2015  CLINICAL DATA:  69 year old male with cardiac arrest. EXAM: CT HEAD WITHOUT CONTRAST TECHNIQUE: Contiguous axial images were obtained from the base of the skull through the vertex without intravenous contrast.  COMPARISON:  Head CT dated 03/29/2015 FINDINGS: There is slight prominence of the ventricles and sulci compatible with age-related volume loss. Mild periventricular and deep white matter hypodensities represent chronic microvascular ischemic changes. There is no intracranial hemorrhage. No mass effect or midline shift identified. The visualized paranasal sinuses and mastoid air cells are well aerated. The calvarium is intact. IMPRESSION: No acute intracranial pathology. Mild age-related atrophy and chronic microvascular ischemic disease. If symptoms persist and there are no contraindications, MRI may provide better evaluation if clinically indicated Electronically Signed   By: Anner Crete M.D.   On: 11/11/2015 23:44   Dg Chest Port 1 View  11/28/2015  CLINICAL DATA:  Intubation. EXAM: PORTABLE CHEST 1 VIEW COMPARISON:  11/27/2015. FINDINGS: Interim extubation and removal of NG tube. Right IJ line in stable position . Stable cardiomegaly. Low lung volumes with mild bibasilar atelectasis and/or infiltrates again noted. No pleural effusion or pneumothorax. IMPRESSION: 1. Interim extubation removal of NG tube. Right IJ line stable position. 2. Bibasilar subsegmental atelectasis and/or infiltrates again noted. No significant interim change. Electronically Signed   By: [place]   On: 11/28/2015 07:36   Dg Chest Port 1 View  11/27/2015  CLINICAL DATA:  Intubation. EXAM: PORTABLE CHEST 1 VIEW COMPARISON:  11/26/2015. FINDINGS: Endotracheal tube, NG tube, right IJ line in stable position. Heart size stable. Persistent bibasilar infiltrates. No pleural effusion or pneumothorax. IMPRESSION: 1. Lines tubes in stable position. 2. Persistent bibasilar infiltrates.  No significant interim change. Electronically Signed   By: Marcello Moores  Register   On: 11/27/2015 07:12   Dg Chest Port 1 View  11/26/2015  CLINICAL DATA:  Intubated patient, respiratory failure, acute MI with cardiac arrest, sepsis EXAM: PORTABLE  CHEST 1 VIEW COMPARISON:  Portable chest x-ray of November 25, 2015 FINDINGS: Patient positioning has changed. The lungs are reasonably well inflated. There is persistent airspace opacity in the right perihilar and infrahilar region on the right. Increased airspace opacity on the left in the perihilar region has developed. There is no pleural effusion or pneumothorax. The cardiac silhouette remains enlarged. The central pulmonary vascularity is mildly prominent. The endotracheal tube tip lies 3.3 cm above the carina. The esophagogastric tube tip projects below the inferior margin of the image. The right internal jugular venous catheter tip projects over the midportion of the SVC. IMPRESSION: Interval worsening of perihilar airspace opacities consistent with atelectasis or pneumonia. Mild central pulmonary vascular prominence is present as well. The support tubes are in reasonable position. Electronically Signed   By: David  Martinique M.D.   On: 11/26/2015 07:09   Dg Chest Port 1 View  11/25/2015  CLINICAL DATA:  Central line placement, endotracheal tube placement EXAM: PORTABLE CHEST 1 VIEW COMPARISON:  11/25/2015 FINDINGS: Cardiomediastinal silhouette is stable. Stable endotracheal and NG tube position. Persistent streaky atelectasis or infiltrate right base medially. There is right IJ central line with tip in SVC. No pneumothorax. IMPRESSION: Stable  endotracheal and NG tube position. Persistent streaky atelectasis or infiltrate right base medially. There is right IJ central line with tip in SVC. No pneumothorax. Electronically Signed   By: Lahoma Crocker M.D.   On: 11/25/2015 10:48   Dg Chest Port 1 View  11/25/2015  CLINICAL DATA:  Acute respiratory failure.  Shortness of breath. EXAM: PORTABLE CHEST 1 VIEW COMPARISON:  11/25/2015 FINDINGS: Interval placement of an endotracheal tube with tip measuring 3.6 cm above the carina. An enteric tube is been placed with tip off the field of view but below the left  hemidiaphragm. Normal heart size and pulmonary vascularity. Increasing atelectasis in the left lung base. Persistent linear atelectasis in both mid lungs. No pneumothorax. No blunting of costophrenic angles. Mediastinal contours appear intact. IMPRESSION: Appliances appear in satisfactory location. Increasing atelectasis in the left lung base with persistent linear atelectasis in both mid lungs. Electronically Signed   By: Lucienne Capers M.D.   On: 11/25/2015 01:18   Dg Chest Portable 1 View  11/25/2015  CLINICAL DATA:  Shortness of breath.  Oxygen desaturation. EXAM: PORTABLE CHEST 1 VIEW COMPARISON:  11/24/2015 FINDINGS: Shallow inspiration. Increasing atelectasis in the left lung base with persistent atelectasis in the bilateral mid lung. Heart size is normal. Mediastinal contours appear intact. No pneumothorax. No blunting of costophrenic angles. IMPRESSION: Increasing atelectasis in the left lung base with persistent linear atelectasis in the mid lungs bilaterally. Electronically Signed   By: Lucienne Capers M.D.   On: 11/25/2015 00:14   Dg Chest Port 1 View  11/24/2015  CLINICAL DATA:  Pneumonia. EXAM: PORTABLE CHEST 1 VIEW COMPARISON:  11/22/2015. FINDINGS: Feeding tube noted coiled in stomach. Mediastinum hilar structures normal. Interim improvement of aeration. Persistent mild bibasilar subsegmental atelectasis. No pleural effusion or pneumothorax. Heart size stable. No acute bony abnormality. IMPRESSION: Interim improvement of aeration with persistent mild bibasilar subsegmental atelectasis. Electronically Signed   By: Marcello Moores  Register   On: 11/24/2015 07:10   Dg Chest Port 1 View  11/22/2015  CLINICAL DATA:  Acute respiratory failure EXAM: PORTABLE CHEST 1 VIEW COMPARISON:  November 21, 2015 FINDINGS: There is patchy atelectasis in the left base, unchanged. Lungs elsewhere clear. Heart is upper normal in size with pulmonary vascularity within normal limits. No adenopathy. Feeding tube tip is  below the diaphragm. IMPRESSION: Persistent patchy atelectasis left base. No new opacity. No change in cardiac silhouette. Electronically Signed   By: Lowella Grip III M.D.   On: 11/22/2015 07:03   Dg Chest Port 1 View  11/21/2015  CLINICAL DATA:  Cough with purulent sputum EXAM: PORTABLE CHEST 1 VIEW COMPARISON:  Yesterday FINDINGS: There is improved aeration, especially at the left lung base, with residual streaky lung opacities at least partially from atelectasis. No pulmonary edema or pleural effusion. No pneumothorax. Normal heart size and mediastinal contours. IMPRESSION: 1. Improved inflation, especially in the left lower lobe, after extubation. 2. Patchy bilateral atelectasis or bronchopneumonia. Electronically Signed   By: Monte Fantasia M.D.   On: 11/21/2015 11:19   Dg Chest Port 1 View  11/20/2015  CLINICAL DATA:  Respiratory failure. EXAM: PORTABLE CHEST 1 VIEW COMPARISON:  11/19/2015.  11/17/2015.  CT 03/29/2015. FINDINGS: Endotracheal tube, NG tube, left IJ line stable position. Worsening bibasilar atelectasis and or infiltrates/edema. Small left pleural effusion. No pneumothorax. Stable cardiomegaly. Multiple left rib fractures again noted. IMPRESSION: 1. Lines and tubes in stable position. 2. Progressive bibasilar atelectasis and/or infiltrates/edema. Small left pleural effusion . 3. Stable cardiomegaly. Electronically Signed  By: [place]   On: 11/20/2015 07:08   Dg Chest Port 1 View  11/18/2015  CLINICAL DATA:  69 year old male with acute respiratory failure. Status post cardiac arrest. Initial encounter. EXAM: PORTABLE CHEST 1 VIEW COMPARISON:  11/17/2015 and earlier. FINDINGS: Portable AP semi upright view at 0514 hours. Stable endotracheal tube. Stable visualized enteric tube coursing to the abdomen. Stable left IJ central line. Increased veiling opacity at the right lung base. Continued dense retrocardiac opacity. Continued left and new right perihilar interstitial  opacity. No pneumothorax. Small left side effusion suspected. IMPRESSION: 1.  Stable lines and tubes. 2. Interval increased bilateral perihilar opacity and right pleural effusion. Favor pulmonary interstitial edema over infection. 3. Continued dense lower lobe collapse/consolidation. Suspect stable small left effusion. Electronically Signed   By: Genevie Ann M.D.   On: 11/18/2015 07:12   Dg Chest Port 1 View  11/17/2015  CLINICAL DATA:  Acute respiratory failure, CHF, and coronary artery disease EXAM: PORTABLE CHEST 1 VIEW COMPARISON:  Portable chest x-ray of November 15, 2015 FINDINGS: The lungs are adequately inflated. There has been further development of alveolar opacity in the left mid and lower lung. The retrocardiac region is opacified and the left hemidiaphragm is largely obscured. There is persistent alveolar opacity in the right infrahilar region. The heart is mildly enlarged. The pulmonary vascularity is mildly engorged. The endotracheal tube tip projects 4.9 cm above the carina. The esophagogastric tube tip projects below the inferior margin of the image. The left internal jugular venous catheter tip projects over the midportion of the SVC. IMPRESSION: Slight interval worsening of alveolar opacities on the left compatible with pneumonia or less likely pulmonary edema. Stable subsegmental atelectasis or early infiltrate in the right infrahilar region. Low-grade CHF. Electronically Signed   By: David  Martinique M.D.   On: 11/17/2015 07:11   Dg Chest Port 1 View  11/15/2015  CLINICAL DATA:  Pulmonary edema.  Recent cardiac catheterization. EXAM: PORTABLE CHEST 1 VIEW COMPARISON:  11/14/2015 and 11/13/2015. FINDINGS: 0524 hours. Endotracheal tube is unchanged, approximately 3 cm above the carina. Left IJ central venous catheter and nasogastric tube are unchanged. The heart size and mediastinal contours are stable. Bibasilar airspace opacities have not significantly changed. There is no pneumothorax or  significant pleural effusion. IMPRESSION: Stable chest with bibasilar airspace opacities. Stable support system. Electronically Signed   By: Richardean Sale M.D.   On: 11/15/2015 09:14   Dg Chest Port 1 View  11/14/2015  CLINICAL DATA:  Hypoxia EXAM: PORTABLE CHEST 1 VIEW COMPARISON:  November 13, 2015 FINDINGS: Endotracheal tube tip is 3.1 cm above the carina. Nasogastric tube tip and side port below the diaphragm. Central catheter tip is in the superior vena cava. No pneumothorax. There is airspace consolidation in the left base. There is mild bibasilar interstitial edema. There is a small left pleural effusion. Heart is slightly enlarged with pulmonary vascularity within normal limits. No adenopathy. IMPRESSION: Tube and catheter positions as described without pneumothorax. Small left effusion with bibasilar edema. Suspect a degree of congestive heart failure. Consolidation in the left base may represent alveolar edema but does suggest potential underlying pneumonia. No new opacity appreciable compared to 1 day prior. Electronically Signed   By: Lowella Grip III M.D.   On: 11/14/2015 07:21   Dg Chest Port 1 View  11/13/2015  CLINICAL DATA:  Cardiac arrest, acute respiratory failure, CHF. EXAM: PORTABLE CHEST 1 VIEW COMPARISON:  Portable chest x-ray of November 12, 2015 FINDINGS: The lungs are  adequately inflated. The retrocardiac region remains dense on the left. The interstitial markings at the right lung base are slightly more conspicuous today. There is no pneumothorax nor large pleural effusion. The heart is top-normal in size. The pulmonary vascularity is not clearly engorged. The endotracheal tube tip lies approximately 2.5 cm above the carina. The esophagogastric tube tip projects below the inferior margin of the image. The left internal jugular venous catheter tip projects over the midportion of the SVC. External pacemaker defibrillator pads are present. IMPRESSION: Increased interstitial  density in both lung bases consistent with infiltrate or pulmonary edema. The upper lobe pulmonary vascularity is only minimally prominent. Stable support tubes. Electronically Signed   By: David  Martinique M.D.   On: 11/13/2015 07:14   Dg Chest Port 1 View  11/12/2015  CLINICAL DATA:  Central line placement EXAM: PORTABLE CHEST 1 VIEW COMPARISON:  11/11/2015 FINDINGS: The endotracheal tube is 4.7 cm above the carina. Nasogastric tube extends into the stomach. There is a new left jugular central line with tip in the low SVC. There is no pneumothorax. Ground-glass opacities are present in the central and basilar regions bilaterally, worsened. This could represent alveolar edema or infectious infiltrate. No large effusions. The left hemi thorax is superimposed by a percutaneous pacing patch. IMPRESSION: 1. New left jugular central line with tip in the low SVC. No pneumothorax. 2. Satisfactorily positioned endotracheal tube and nasogastric tube. 3. New/worsening central alveolar edema or infiltrate. Electronically Signed   By: Andreas Newport M.D.   On: 11/12/2015 06:00   Dg Chest Portable 1 View  11/11/2015  CLINICAL DATA:  Myocardial infarction.  Post resuscitation. EXAM: PORTABLE CHEST 1 VIEW COMPARISON:  03/29/2015 FINDINGS: Endotracheal tube tip is 2.6 cm above the carina. The nasogastric tube extends into the stomach. The lungs are clear except for mild atelectatic appearing linear basilar opacities on the left. No confluent airspace consolidation. No large effusion. No pneumothorax. Mild central vascular prominence, which can be physiologic in the supine position. IMPRESSION: Support equipment appears satisfactorily positioned. Electronically Signed   By: Andreas Newport M.D.   On: 11/11/2015 22:12   Dg Abd Portable 1v  11/25/2015  CLINICAL DATA:  Feeding tube placement. EXAM: PORTABLE ABDOMEN - 1 VIEW COMPARISON:  11/21/2015 FINDINGS: Enteric tube tip is across the midline in the right upper  quadrant consistent with location in the distal stomach or proximal duodenum. Motion artifact limits examination. IMPRESSION: Enteric tube tip is in the right upper quadrant consistent with location in the distal stomach or proximal duodenum. Electronically Signed   By: Lucienne Capers M.D.   On: 11/25/2015 01:19   Dg Abd Portable 1v  11/18/2015  CLINICAL DATA:  Orogastric tube placement EXAM: PORTABLE ABDOMEN - 1 VIEW COMPARISON:  CT abdomen and pelvis March 29, 2015 FINDINGS: Orogastric tube tip and side port are in the stomach. There is moderate stool throughout the colon. There is no bowel dilatation or air-fluid level suggesting obstruction. No free air is seen on this supine examination. IMPRESSION: Orogastric tube tip and side port in stomach. Bowel gas pattern unremarkable. Moderate stool throughout colon. Electronically Signed   By: Lowella Grip III M.D.   On: 11/18/2015 08:15   Dg Swallowing Func-speech Pathology  11/28/2015  Objective Swallowing Evaluation:   Patient Details Name: [name] Date of Birth: 1946-05-07 Today's Date: 11/28/2015 Time: SLP Start Time (ACUTE ONLY): 1021-SLP Stop Time (ACUTE ONLY): 1036 SLP Time Calculation (min) (ACUTE ONLY): 15 min Past Medical History:  Past Medical History Diagnosis Date . Rheumatoid arthritis(714.0) 1998 . Nephrolithiasis 1968 . Insomnia  . Allergic rhinitis  . Impaired glucose tolerance  . Hypertension  . ST elevation (STEMI) myocardial infarction involving left anterior descending coronary artery ([place]) 11/11/2015   100% LAD, Cardiogenic Shock. -- EF ~20-25% . Cardiac arrest with ventricular fibrillation ([place]) 11/11/2015 . CAD S/P PCI OF pLAD with 3.5 mm x38 mm Promus DES (3.75 mm) 11/12/2015 . Cardiomyopathy, ischemic - EF 20-25% by LV Gram 11/12/2015   There is severe left ventricular systolic dysfunction. The left ventricular ejection fraction is 25-35% by visual estimate. There are wall motion abnormalities in the  left ventricle. There are segmental wall motion abnormalities in the left ventricle. The anterolateral and periapical LV walls are akinetic with an estimated LVEF of 25%  Past Surgical History: Past Surgical History Procedure Laterality Date . Status post multiple lithotripy and urological surgery for reccurent calcium oxalate stones   . Cardiac catheterization N/A 11/11/2015   Procedure: Left Heart Cath and Cors/Grafts Angiography;  Surgeon: Sherren Mocha, MD;  Location: [facility];  Service: Cardiovascular;  Laterality: N/A; . Cardiac catheterization N/A 11/11/2015   Procedure: Coronary Stent Intervention;  Surgeon: Sherren Mocha, MD;  Location: [facility];  Service: Cardiovascular;  Laterality: N/A;  prox lad 3.5x38 promus HPI: 69 year old male with PMH of RA and HTN. He presented to [facility] ED 11/29 after a witnessed cardiac arrest at home. Family initiated CPR. EMS arrived and AED was placed. Two shocks were delivered until ROSC was achieved. Total down time estimated at less than 15 minutes. Pt was intubated 11/29-12/8, and required reintubation 12/13-12/15. Subjective: alert, pleasant mood, dysphonic Assessment / Plan / Recommendation CHL IP CLINICAL IMPRESSIONS 11/28/2015 Therapy Diagnosis Mild pharyngeal phase dysphagia;Moderate pharyngeal phase dysphagia Clinical Impression Pt has a mild-moderate pharyngeal dysphagia that is likely acute and reversible secondary to deconditioning and prolonged intubations. Oral phase is grossly WFL. Pharyngeal weakness and likely decreased glottal closure result in penetration during the swallow with straw sips of nectar thick liquids. Vallecular residue is also present with all consistencies tested, although increases with more solid textures with soft solid bolus remaining almost entirely post-swallow. Chin tuck does not help in clearance, but small liquid wash does. Recommend Dys 1 diet and cup sips of nectar thick liquids with multiple swallows.  SLP to f/u for tolerance and advancement. Impact on safety and function Mild aspiration risk   CHL IP TREATMENT RECOMMENDATION 11/28/2015 Treatment Recommendations Therapy as outlined in treatment plan below   Prognosis 11/28/2015 Prognosis for Safe Diet Advancement Good Barriers to Reach Goals -- Barriers/Prognosis Comment -- CHL IP DIET RECOMMENDATION 11/28/2015 SLP Diet Recommendations Dysphagia 1 (Puree) solids;Nectar thick liquid Liquid Administration via Cup;No straw Medication Administration Crushed with puree Compensations Slow rate;Small sips/bites;Multiple dry swallows after each bite/sip Postural Changes Seated upright at 90 degrees   CHL IP OTHER RECOMMENDATIONS 11/28/2015 Recommended Consults -- Oral Care Recommendations Oral care BID Other Recommendations Order thickener from pharmacy;Prohibited food (jello, ice cream, thin soups);Remove water pitcher   CHL IP FOLLOW UP RECOMMENDATIONS 11/28/2015 Follow up Recommendations Inpatient Rehab   CHL IP FREQUENCY AND DURATION 11/28/2015 Speech Therapy Frequency (ACUTE ONLY) min 2x/week Treatment Duration 2 weeks      CHL IP ORAL PHASE 11/28/2015 Oral Phase WFL Oral - Pudding Teaspoon -- Oral - Pudding Cup -- Oral - Honey Teaspoon -- Oral - Honey Cup -- Oral - Nectar Teaspoon -- Oral - Nectar Cup -- Oral -  Nectar Straw -- Oral - Thin Teaspoon -- Oral - Thin Cup -- Oral - Thin Straw -- Oral - Puree -- Oral - Mech Soft -- Oral - Regular -- Oral - Multi-Consistency -- Oral - Pill -- Oral Phase - Comment --  CHL IP PHARYNGEAL PHASE 11/28/2015 Pharyngeal Phase Impaired Pharyngeal- Pudding Teaspoon -- Pharyngeal -- Pharyngeal- Pudding Cup -- Pharyngeal -- Pharyngeal- Honey Teaspoon -- Pharyngeal -- Pharyngeal- Honey Cup -- Pharyngeal -- Pharyngeal- Nectar Teaspoon Reduced anterior laryngeal mobility;Reduced laryngeal elevation;Reduced tongue base retraction Pharyngeal -- Pharyngeal- Nectar Cup Reduced anterior laryngeal mobility;Reduced laryngeal elevation;Reduced  tongue base retraction;Pharyngeal residue - valleculae Pharyngeal -- Pharyngeal- Nectar Straw Reduced anterior laryngeal mobility;Reduced laryngeal elevation;Reduced tongue base retraction;Pharyngeal residue - valleculae;Penetration/Aspiration during swallow Pharyngeal Material enters airway, remains ABOVE vocal cords and not ejected out Pharyngeal- Thin Teaspoon -- Pharyngeal -- Pharyngeal- Thin Cup -- Pharyngeal -- Pharyngeal- Thin Straw -- Pharyngeal -- Pharyngeal- Puree Reduced anterior laryngeal mobility;Reduced laryngeal elevation;Reduced tongue base retraction;Pharyngeal residue - valleculae Pharyngeal -- Pharyngeal- Mechanical Soft Reduced anterior laryngeal mobility;Reduced laryngeal elevation;Reduced tongue base retraction;Pharyngeal residue - valleculae Pharyngeal -- Pharyngeal- Regular -- Pharyngeal -- Pharyngeal- Multi-consistency -- Pharyngeal -- Pharyngeal- Pill -- Pharyngeal -- Pharyngeal Comment --  CHL IP CERVICAL ESOPHAGEAL PHASE 11/28/2015 Cervical Esophageal Phase WFL Pudding Teaspoon -- Pudding Cup -- Honey Teaspoon -- Honey Cup -- Nectar Teaspoon -- Nectar Cup -- Nectar Straw -- Thin Teaspoon -- Thin Cup -- Thin Straw -- Puree -- Mechanical Soft -- Regular -- Multi-consistency -- Pill -- Cervical Esophageal Comment -- [name], M.A. CCC-SLP [phone] [name] 11/28/2015, 11:54 AM                 ASSESSMENT: s/p cardiac arrest, anterior MI  PLAN:  Continue DAPT. Now extubated.  Weaning neo.  Add back beta blocker as BP allows.  Treat C. Diff.  Patient reasonably stable from cardiac standpoint.  EF had recovered at last check.  Jettie Booze, MD  11/28/2015  1:32 PM

## 2015-11-28 NOTE — Progress Notes (Signed)
PULMONARY / CRITICAL CARE MEDICINE   Name: [name] MRN: [id number] DOB: 12-02-1946    ADMISSION DATE:  11/11/2015 CONSULTATION DATE:  11/11/2015  REFERRING MD:  Dr. Burt Knack  CHIEF COMPLAINT:  Cardiac arrest  HISTORY OF PRESENT ILLNESS:  69 year old male with PMH of RA and HTN. He presented to [facility] ED 11/29 after a witnessed cardiac arrest at home. He has had intermittent chest and arm pain for about 2 weeks status post mechanical fall. 11/29 chest pain acutely worsened and he unfortunately suffered a cardiac arrest, which was witnessed by his wife and EMS was called. Family initiated CPR. EMS arrived and AED was placed advising shock. 2 shocks were delivered and ROSC was achieved. 2 rounds epinephrine also administerred. EKG in the field was concerning for STEMI and code STEMI was called. In ED patient was unresponsive, he was intubated and taken emergently to cardiac cath lab where he remains at this time. Occlusive LAD lesion identified and intervened upon. PCCM consulted for ICU care. Total downtime estimated at less than 15 mins.   SUBJECTIVE / Interval events: No events overnight, awake this AM and following commands.  VITAL SIGNS: BP 101/57 mmHg  Pulse 97  Temp(Src) 97.8 F (36.6 C) (Oral)  Resp 30  Ht 5\' 7"  (1.702 m)  Wt 81.5 kg (179 lb 10.8 oz)  BMI 28.13 kg/m2  SpO2 95% 40% high flow  HEMODYNAMICS: CVP:  [1 mmHg-5 mmHg] 2 mmHg  INTAKE / OUTPUT: I/O last 3 completed shifts: In: 6260.2 [I.V.:3080.2; NG/GT:1930; IV [id number] Out: [id number] [Urine:2855; Stool:900]  PHYSICAL EXAMINATION: General: Well appearing, NAD, moving all ext to command, slight confusion at times. Neuro: Moving all ext to command, WNL. HEENT: Moist mucous membranes, [place]/AT, PERRL, EOM-I. Cardiovascular: Nl S1/S2, regular rate and rhythm, no murmurs rubs gallops  Lungs: Coarse BS diffusely. Abdomen: Somewhat distended, positive bowel sounds  Skin: Intact.  -edema    LABS:  CBC  Recent Labs Lab 11/26/15 0352 11/27/15 0407 11/28/15 0430  WBC 37.5* 25.1* 19.2*  HGB 13.7 12.1* 12.1*  HCT 42.1 38.4* 35.9*  PLT 398 317 325   Coag's No results for input(s): APTT, INR in the last 168 hours. BMET  Recent Labs Lab 11/26/15 0352 11/27/15 0407 11/28/15 0430  NA 145 148* 148*  K 3.4* 3.8 3.5  CL 115* 120* 120*  CO2 22 23 23   BUN 84* 43* 25*  CREATININE 1.81* 1.05 0.89  GLUCOSE 263* 216* 138*   Electrolytes  Recent Labs Lab 11/26/15 0352 11/27/15 0407 11/28/15 0430  CALCIUM 7.5* 7.3* 7.2*  MG 2.9* 2.7* 2.5*  PHOS 5.1* 1.7* 1.4*   Sepsis Markers  Recent Labs Lab 11/22/15 0431  PROCALCITON 0.24   ABG  Recent Labs Lab 11/26/15 0335 11/27/15 0500 11/28/15 0426  PHART 7.396 7.379 7.521*  PCO2ART 35.0 37.5 26.7*  PO2ART 78.0* 102* 76.9*   Liver Enzymes  Recent Labs Lab 11/22/15 0431 11/23/15 0331 11/24/15 0319  ALBUMIN 2.5* 2.5* 2.6*   Cardiac Enzymes No results for input(s): TROPONINI, PROBNP in the last 168 hours. Glucose  Recent Labs Lab 11/27/15 1215 11/27/15 1627 11/27/15 2012 11/28/15 0023 11/28/15 0434 11/28/15 0823  GLUCAP 165* 120* 146* 135* 126* 129*   Imaging Dg Chest Port 1 View  11/28/2015  CLINICAL DATA:  Intubation. EXAM: PORTABLE CHEST 1 VIEW COMPARISON:  11/27/2015. FINDINGS: Interim extubation and removal of NG tube. Right IJ line in stable position . Stable cardiomegaly. Low lung volumes with mild bibasilar atelectasis and/or infiltrates  again noted. No pleural effusion or pneumothorax. IMPRESSION: 1. Interim extubation removal of NG tube. Right IJ line stable position. 2. Bibasilar subsegmental atelectasis and/or infiltrates again noted. No significant interim change. Electronically Signed   By: Marcello Moores  Register   On: 11/28/2015 07:36   I reviewed CXR myself, basilar L>R atx  STUDIES:  2023/12/04  LHC - stent lad 12/04/23  CT head - no acute abnormality 11/30  eeg - no seizure activity.  suppression 12/03  Port CXR 12/3 - Bilat lower lung opacity.  CULTURES: Resp 12/9 >> normal flora C diff 12/12 >> positive   ANTIBIOTICS: Levaquin 11/30 >> 12/4 Vanco 12/9  >> > stop date Aztreonam 12/9 >> Vanco PO 12/12 >>  Flagyl 12/14>>>  SIGNIFICANT EVENTS: 2023/12/04 cardiac arrest > cath lab, out to ICU, therapeutic hypothermia.  12/02 completed rewarming 12/9 Extubated 12/10 getting OOB. Still weak, kept in ICU for intensive pulmonary toilet. Added decadron for Laryngeal edema.  12/11 remarkably better. Phonation improved. Getting OOB.  12/13 reintubated for laryngeal edema.  LINES/TUBES: ETT 12/04/2023 >>>12/8>>>12/13>>> CVL Dec 04, 2023 >>>12/9 Left radial 12-04-2023 >>>12/3  ASSESSMENT / PLAN:  PULMONARY A: Recurrent Acute Hypoxic Respiratory Failure, multifactorial due to overall illness + C diff colitis / dehydration + altered Severe CAP vs Aspiration Pneumonia  HCAP Probable post-intubation Laryngeal edema   P: Titrate O2 for sat of 88-92% IS per RT protocol Ambulate  CARDIOVASCULAR A:  Cardiac arrest - VT/VF STEMI s/p PCI 12-04-23 ST HTN ICM w ef 25%-->now 50-55% after intervention   P:  Care per Cardiology-->hold all anti-HTN. Remains on neo 20 mcg for BP support. Asa, Brilinta, Lopressor & Lipitor > will place OGT to allow him to receive. Check CVP, may need additional fluid to get off neo.  RENAL A:   Hypernatremia. Worsening renal function, dehydrated.  P:   Monitor UOP / BMP. Replace electrolytes as indicated. KVO IVF. Encourage fluid intake if able to take PO, in the meantime continue free water 300 ml q6.  GASTROINTESTINAL A:   Transaminitis - Shock liver. Resolving. At Risk Protein Calorie Malnutrition  Post intubation dysphagia  P:   Continue TF per nutrition until able to pass swallow evaluation then will take NGT out Pepcid IV q12hr.  HEMATOLOGIC A:   Leukocytosis - Improving. Suspect stress response & exacerbated by steroids Anemia  P:   Follow CBC. Antiplatelet / anticoagulation per Cardiology. Heparin [place] q8hr for DVT prophylaxis.  INFECTIOUS A:   Possible CAP HCAP C diff colitis positive  P:   Broad-spectrum antibiotics for HCAP started 12/9, continue aztreonam and PO vancomycin (for c diff), continue for now. Monitor fever curve/WBC. Enteral vanco started 12/12. Adding flagyl IV seemed very effective in controlling infection and WBC.  ENDOCRINE A:   Hyperglycemia - No H/O DM.  P:   ISS q4. SSI coverage.  AUTOIMMUNE:  A:  Rheumatoid Arthritis - on DMARD & Methotrexate at baseline   P:  Hold home medications  NEUROLOGIC A:   Deconditioning s/p critical illness  P:   Stop trazodone. D/C sedation now that patient is extubated.  FAMILY - Updates: No family bedside.   The patient is critically ill with multiple organ systems failure and requires high complexity decision making for assessment and support, frequent evaluation and titration of therapies, application of advanced monitoring technologies and extensive interpretation of multiple databases.   Critical Care Time devoted to patient care services described in this note is  35  Minutes. This time reflects time of care of  this signee Dr Jennet Maduro. This critical care time does not reflect procedure time, or teaching time or supervisory time of PA/NP/Med student/Med Resident etc but could involve care discussion time.  Rush Farmer, M.D. [facility] Pulmonary/Critical Care Medicine. Pager: [phone]. After hours pager: [phone].

## 2015-11-28 NOTE — Progress Notes (Signed)
   11/28/15 1700  Clinical Encounter Type  Visited With Patient and family together  Visit Type Follow-up  Spiritual Encounters  Spiritual Needs Grief support;Emotional  Stress Factors  Patient Stress Factors Other (Comment);None identified  [place] visited on follow-up with pt and spouse; pt alert and talking; Spouse appears tired and complained that pt is verbally abrasive; [place] offered spiritual and emotional support and will continue to follow-up. 5:40 PM Gwynn Burly

## 2015-11-28 NOTE — Progress Notes (Signed)
Pt adamantly refuses flexiseal to be placed back in. Pt informed of reasoning ordered. Pt still refuses. Pt states "Im leaving in a few days and I dont want it." Talked with pt about being able to sleep without being changed every hour if flexiseal was put back in and he wouldn't be SOB with changing linen. Despite the information pt refuses. Will continue to monitor.

## 2015-11-28 NOTE — Evaluation (Signed)
Clinical/Bedside Swallow Evaluation Patient Details  Name: [name] MRN: [id number] Date of Birth: Dec 03, 1946  Today's Date: 11/28/2015 Time: SLP Start Time (ACUTE ONLY): [id number] SLP Stop Time (ACUTE ONLY): 0935 SLP Time Calculation (min) (ACUTE ONLY): 10 min  Past Medical History:  Past Medical History  Diagnosis Date  . Rheumatoid arthritis(714.0) 1998  . Nephrolithiasis 1968  . Insomnia   . Allergic rhinitis   . Impaired glucose tolerance   . Hypertension   . ST elevation (STEMI) myocardial infarction involving left anterior descending coronary artery ([place]) 11/11/2015    100% LAD, Cardiogenic Shock. -- EF ~20-25%  . Cardiac arrest with ventricular fibrillation ([place]) 11/11/2015  . CAD S/P PCI OF pLAD with 3.5 mm x38 mm Promus DES (3.75 mm) 11/12/2015  . Cardiomyopathy, ischemic - EF 20-25% by LV Gram 11/12/2015    There is severe left ventricular systolic dysfunction. The left ventricular ejection fraction is 25-35% by visual estimate. There are wall motion abnormalities in the left ventricle. There are segmental wall motion abnormalities in the left ventricle. The anterolateral and periapical LV walls are akinetic with an estimated LVEF of 25%    Past Surgical History:  Past Surgical History  Procedure Laterality Date  . Status post multiple lithotripy and urological surgery for reccurent calcium oxalate stones    . Cardiac catheterization N/A 11/11/2015    Procedure: Left Heart Cath and Cors/Grafts Angiography;  Surgeon: Sherren Mocha, MD;  Location: [place] [facility];  Service: Cardiovascular;  Laterality: N/A;  . Cardiac catheterization N/A 11/11/2015    Procedure: Coronary Stent Intervention;  Surgeon: Sherren Mocha, MD;  Location: [place] CV LAB;  Service: Cardiovascular;  Laterality: N/A;  prox lad 3.5x38 promus   HPI:  69 year old male with PMH of RA and HTN. He presented to [facility] ED 11/29 after a witnessed cardiac arrest at home. Family initiated CPR.  EMS arrived and AED was placed. Two shocks were delivered until ROSC was achieved. Total down time estimated at less than 15 minutes. Pt was intubated 11/29-12/8, and required reintubation 12/13-12/15.   Assessment / Plan / Recommendation Clinical Impression  Pt is dysphonic although with mildly stronger phonation than after prior intubation. Ice chips elicit a consistent, immediate cough response. Several bites of puree are provided without change in vocal quality, but with cough x1. Recommend to proceed with objective testing prior to PO initiation.    Aspiration Risk  Moderate aspiration risk    Diet Recommendation  NPO   Medication Administration: Via alternative means    Other  Recommendations Oral Care Recommendations: Oral care QID   Follow up Recommendations       Frequency and Duration            Prognosis        Swallow Study   General Date of Onset: 11/11/15 HPI: 69 year old male with PMH of RA and HTN. He presented to [facility] ED 11/29 after a witnessed cardiac arrest at home. Family initiated CPR. EMS arrived and AED was placed. Two shocks were delivered until ROSC was achieved. Total down time estimated at less than 15 minutes. Pt was intubated 11/29-12/8, and required reintubation 12/13-12/15. Type of Study: Bedside Swallow Evaluation Previous Swallow Assessment: BSE 12/09 recommending NPO Diet Prior to this Study: NPO Temperature Spikes Noted: Yes (100.2) Respiratory Status: Nasal cannula History of Recent Intubation: Yes Length of Intubations (days): 13 days (across 2 intubations) Date extubated: 11/27/15 Behavior/Cognition: Alert;Cooperative;Pleasant mood Oral Cavity Assessment: Dry Oral  Cavity - Dentition: Adequate natural dentition Vision: Functional for self-feeding Self-Feeding Abilities: Needs assist Patient Positioning: Upright in bed Baseline Vocal Quality: Low vocal intensity Volitional Cough: Weak;Congested    Oral/Motor/Sensory Function  Overall Oral Motor/Sensory Function: Generalized oral weakness   Ice Chips Ice chips: Impaired Presentation: Spoon Pharyngeal Phase Impairments: Suspected delayed Swallow;Cough - Immediate   Thin Liquid Thin Liquid: Not tested    Nectar Thick Nectar Thick Liquid: Not tested   Honey Thick Honey Thick Liquid: Not tested   Puree Puree: Impaired Presentation: Spoon Pharyngeal Phase Impairments: Suspected delayed Swallow;Cough - Immediate (cough x1)   Solid Solid: Not tested      [name], M.A. CCC-SLP [phone]  [name] 11/28/2015,10:20 AM

## 2015-11-29 LAB — BASIC METABOLIC PANEL
Anion gap: 6 (ref 5–15)
BUN: 20 mg/dL (ref 6–20)
CHLORIDE: 123 mmol/L — AB (ref 101–111)
CO2: 23 mmol/L (ref 22–32)
Calcium: 7.5 mg/dL — ABNORMAL LOW (ref 8.9–10.3)
Creatinine, Ser: 0.86 mg/dL (ref 0.61–1.24)
Glucose, Bld: 140 mg/dL — ABNORMAL HIGH (ref 65–99)
POTASSIUM: 3.6 mmol/L (ref 3.5–5.1)
SODIUM: 152 mmol/L — AB (ref 135–145)

## 2015-11-29 LAB — GLUCOSE, CAPILLARY
GLUCOSE-CAPILLARY: 138 mg/dL — AB (ref 65–99)
GLUCOSE-CAPILLARY: 172 mg/dL — AB (ref 65–99)
Glucose-Capillary: 143 mg/dL — ABNORMAL HIGH (ref 65–99)
Glucose-Capillary: 163 mg/dL — ABNORMAL HIGH (ref 65–99)
Glucose-Capillary: 147 mg/dL — ABNORMAL HIGH (ref 65–99)

## 2015-11-29 LAB — PHOSPHORUS: PHOSPHORUS: 2.2 mg/dL — AB (ref 2.5–4.6)

## 2015-11-29 LAB — CBC
HCT: 35.8 % — ABNORMAL LOW (ref 39.0–52.0)
HEMOGLOBIN: 11.7 g/dL — AB (ref 13.0–17.0)
MCH: 31 pg (ref 26.0–34.0)
MCHC: 32.7 g/dL (ref 30.0–36.0)
MCV: 94.7 fL (ref 78.0–100.0)
PLATELETS: 350 10*3/uL (ref 150–400)
RBC: 3.78 MIL/uL — AB (ref 4.22–5.81)
RDW: 15.5 % (ref 11.5–15.5)
WBC: 14.2 10*3/uL — ABNORMAL HIGH (ref 4.0–10.5)

## 2015-11-29 LAB — MAGNESIUM: MAGNESIUM: 2.3 mg/dL (ref 1.7–2.4)

## 2015-11-29 MED ORDER — TRAZODONE HCL 50 MG PO TABS
50.0000 mg | ORAL_TABLET | Freq: Every day | ORAL | Status: DC
  Administered 2015-11-29 – 2015-12-03 (×5): 50 mg via ORAL
  Filled 2015-11-29 (×5): qty 1

## 2015-11-29 NOTE — Progress Notes (Addendum)
PULMONARY / CRITICAL CARE MEDICINE   Name: [name] MRN: [id number] DOB: December 11, 1946    ADMISSION DATE:  11/11/2015 CONSULTATION DATE:  11/11/2015  REFERRING MD:  Dr. Burt Knack  CHIEF COMPLAINT:  Cardiac arrest  HISTORY OF PRESENT ILLNESS:  69 year old male with PMH of RA and HTN. He presented to [facility] ED 11/29 after a witnessed cardiac arrest at home. He has had intermittent chest and arm pain for about 2 weeks status post mechanical fall. 11/29 chest pain acutely worsened and he unfortunately suffered a cardiac arrest, which was witnessed by his wife and EMS was called. Family initiated CPR. EMS arrived and AED was placed advising shock. 2 shocks were delivered and ROSC was achieved. 2 rounds epinephrine also administerred. EKG in the field was concerning for STEMI and code STEMI was called. In ED patient was unresponsive, he was intubated and taken emergently to cardiac cath lab where he remains at this time. Occlusive LAD lesion identified and intervened upon. PCCM consulted for ICU care. Total downtime estimated at less than 15 mins.   SUBJECTIVE / Interval events: Phenylephrine off Still some confusion Taking some PO but note hypernatremia  VITAL SIGNS: BP 111/64 mmHg  Pulse 95  Temp(Src) 98.5 F (36.9 C) (Oral)  Resp 26  Ht 5\' 7"  (1.702 m)  Wt 79.5 kg (175 lb 4.3 oz)  BMI 27.44 kg/m2  SpO2 99% 40% high flow  HEMODYNAMICS: CVP:  [2 mmHg-5 mmHg] 5 mmHg  INTAKE / OUTPUT: I/O last 3 completed shifts: In: 1789.9 [I.V.:579.9; IV [id number] Out: 2055 [Urine:1455; Stool:600]  PHYSICAL EXAMINATION: General: Well appearing, NAD, moving all ext to command, slight confusion at times. Neuro: Moving all ext to command, WNL. HEENT: Moist mucous membranes, Laura/AT, PERRL, EOM-I. Cardiovascular: Nl S1/S2, regular rate and rhythm, no murmurs rubs gallops  Lungs: distant, no wheeze, few crackles Abdomen: Somewhat distended, positive bowel sounds  Skin: Intact.  -edema    LABS:  CBC  Recent Labs Lab 11/27/15 0407 11/28/15 0430 11/29/15 0445  WBC 25.1* 19.2* 14.2*  HGB 12.1* 12.1* 11.7*  HCT 38.4* 35.9* 35.8*  PLT 317 325 350   Coag's No results for input(s): APTT, INR in the last 168 hours. BMET  Recent Labs Lab 11/27/15 0407 11/28/15 0430 11/29/15 0445  NA 148* 148* 152*  K 3.8 3.5 3.6  CL 120* 120* 123*  CO2 23 23 23   BUN 43* 25* 20  CREATININE 1.05 0.89 0.86  GLUCOSE 216* 138* 140*   Electrolytes  Recent Labs Lab 11/27/15 0407 11/28/15 0430 11/29/15 0445  CALCIUM 7.3* 7.2* 7.5*  MG 2.7* 2.5* 2.3  PHOS 1.7* 1.4* 2.2*   Sepsis Markers No results for input(s): LATICACIDVEN, PROCALCITON, O2SATVEN in the last 168 hours. ABG  Recent Labs Lab 11/26/15 0335 11/27/15 0500 11/28/15 0426  PHART 7.396 7.379 7.521*  PCO2ART 35.0 37.5 26.7*  PO2ART 78.0* 102* 76.9*   Liver Enzymes  Recent Labs Lab 11/23/15 0331 11/24/15 0319  ALBUMIN 2.5* 2.6*   Cardiac Enzymes No results for input(s): TROPONINI, PROBNP in the last 168 hours. Glucose  Recent Labs Lab 11/27/15 2012 11/28/15 0023 11/28/15 0434 11/28/15 0823 11/28/15 1956 11/28/15 2346  GLUCAP 146* 135* 126* 129* 179* 143*   Imaging Dg Swallowing Func-speech Pathology  11/28/2015  Objective Swallowing Evaluation:   Patient Details Name: [name] MRN: [id number] Date of Birth: 1946/05/23 Today's Date: 11/28/2015 Time: SLP Start Time (ACUTE ONLY): 1021-SLP Stop Time (ACUTE ONLY): 1036 SLP Time Calculation (min) (ACUTE ONLY):  15 min Past Medical History: Past Medical History Diagnosis Date . Rheumatoid arthritis(714.0) 1998 . Nephrolithiasis 1968 . Insomnia  . Allergic rhinitis  . Impaired glucose tolerance  . Hypertension  . ST elevation (STEMI) myocardial infarction involving left anterior descending coronary artery ([place]) 11/11/2015   100% LAD, Cardiogenic Shock. -- EF ~20-25% . Cardiac arrest with ventricular fibrillation ([place]) 11/11/2015 . CAD S/P PCI OF  pLAD with 3.5 mm x38 mm Promus DES (3.75 mm) 11/12/2015 . Cardiomyopathy, ischemic - EF 20-25% by LV Gram 11/12/2015   There is severe left ventricular systolic dysfunction. The left ventricular ejection fraction is 25-35% by visual estimate. There are wall motion abnormalities in the left ventricle. There are segmental wall motion abnormalities in the left ventricle. The anterolateral and periapical LV walls are akinetic with an estimated LVEF of 25%  Past Surgical History: Past Surgical History Procedure Laterality Date . Status post multiple lithotripy and urological surgery for reccurent calcium oxalate stones   . Cardiac catheterization N/A 11/11/2015   Procedure: Left Heart Cath and Cors/Grafts Angiography;  Surgeon: Sherren Mocha, MD;  Location: [place] [facility];  Service: Cardiovascular;  Laterality: N/A; . Cardiac catheterization N/A 11/11/2015   Procedure: Coronary Stent Intervention;  Surgeon: Sherren Mocha, MD;  Location: [place] [facility];  Service: Cardiovascular;  Laterality: N/A;  prox lad 3.5x38 promus HPI: 69 year old male with PMH of RA and HTN. He presented to [facility] ED 11/29 after a witnessed cardiac arrest at home. Family initiated CPR. EMS arrived and AED was placed. Two shocks were delivered until ROSC was achieved. Total down time estimated at less than 15 minutes. Pt was intubated 11/29-12/8, and required reintubation 12/13-12/15. Subjective: alert, pleasant mood, dysphonic Assessment / Plan / Recommendation CHL IP CLINICAL IMPRESSIONS 11/28/2015 Therapy Diagnosis Mild pharyngeal phase dysphagia;Moderate pharyngeal phase dysphagia Clinical Impression Pt has a mild-moderate pharyngeal dysphagia that is likely acute and reversible secondary to deconditioning and prolonged intubations. Oral phase is grossly WFL. Pharyngeal weakness and likely decreased glottal closure result in penetration during the swallow with straw sips of nectar thick liquids. Vallecular residue is also present  with all consistencies tested, although increases with more solid textures with soft solid bolus remaining almost entirely post-swallow. Chin tuck does not help in clearance, but small liquid wash does. Recommend Dys 1 diet and cup sips of nectar thick liquids with multiple swallows. SLP to f/u for tolerance and advancement. Impact on safety and function Mild aspiration risk   CHL IP TREATMENT RECOMMENDATION 11/28/2015 Treatment Recommendations Therapy as outlined in treatment plan below   Prognosis 11/28/2015 Prognosis for Safe Diet Advancement Good Barriers to Reach Goals -- Barriers/Prognosis Comment -- CHL IP DIET RECOMMENDATION 11/28/2015 SLP Diet Recommendations Dysphagia 1 (Puree) solids;Nectar thick liquid Liquid Administration via Cup;No straw Medication Administration Crushed with puree Compensations Slow rate;Small sips/bites;Multiple dry swallows after each bite/sip Postural Changes Seated upright at 90 degrees   CHL IP OTHER RECOMMENDATIONS 11/28/2015 Recommended Consults -- Oral Care Recommendations Oral care BID Other Recommendations Order thickener from pharmacy;Prohibited food (jello, ice cream, thin soups);Remove water pitcher   CHL IP FOLLOW UP RECOMMENDATIONS 11/28/2015 Follow up Recommendations Inpatient Rehab   CHL IP FREQUENCY AND DURATION 11/28/2015 Speech Therapy Frequency (ACUTE ONLY) min 2x/week Treatment Duration 2 weeks      CHL IP ORAL PHASE 11/28/2015 Oral Phase WFL Oral - Pudding Teaspoon -- Oral - Pudding Cup -- Oral - Honey Teaspoon -- Oral - Honey Cup -- Oral - Nectar Teaspoon -- Oral -  Nectar Cup -- Oral - Nectar Straw -- Oral - Thin Teaspoon -- Oral - Thin Cup -- Oral - Thin Straw -- Oral - Puree -- Oral - Mech Soft -- Oral - Regular -- Oral - Multi-Consistency -- Oral - Pill -- Oral Phase - Comment --  CHL IP PHARYNGEAL PHASE 11/28/2015 Pharyngeal Phase Impaired Pharyngeal- Pudding Teaspoon -- Pharyngeal -- Pharyngeal- Pudding Cup -- Pharyngeal -- Pharyngeal- Honey Teaspoon --  Pharyngeal -- Pharyngeal- Honey Cup -- Pharyngeal -- Pharyngeal- Nectar Teaspoon Reduced anterior laryngeal mobility;Reduced laryngeal elevation;Reduced tongue base retraction Pharyngeal -- Pharyngeal- Nectar Cup Reduced anterior laryngeal mobility;Reduced laryngeal elevation;Reduced tongue base retraction;Pharyngeal residue - valleculae Pharyngeal -- Pharyngeal- Nectar Straw Reduced anterior laryngeal mobility;Reduced laryngeal elevation;Reduced tongue base retraction;Pharyngeal residue - valleculae;Penetration/Aspiration during swallow Pharyngeal Material enters airway, remains ABOVE vocal cords and not ejected out Pharyngeal- Thin Teaspoon -- Pharyngeal -- Pharyngeal- Thin Cup -- Pharyngeal -- Pharyngeal- Thin Straw -- Pharyngeal -- Pharyngeal- Puree Reduced anterior laryngeal mobility;Reduced laryngeal elevation;Reduced tongue base retraction;Pharyngeal residue - valleculae Pharyngeal -- Pharyngeal- Mechanical Soft Reduced anterior laryngeal mobility;Reduced laryngeal elevation;Reduced tongue base retraction;Pharyngeal residue - valleculae Pharyngeal -- Pharyngeal- Regular -- Pharyngeal -- Pharyngeal- Multi-consistency -- Pharyngeal -- Pharyngeal- Pill -- Pharyngeal -- Pharyngeal Comment --  CHL IP CERVICAL ESOPHAGEAL PHASE 11/28/2015 Cervical Esophageal Phase WFL Pudding Teaspoon -- Pudding Cup -- Honey Teaspoon -- Honey Cup -- Nectar Teaspoon -- Nectar Cup -- Nectar Straw -- Thin Teaspoon -- Thin Cup -- Thin Straw -- Puree -- Mechanical Soft -- Regular -- Multi-consistency -- Pill -- Cervical Esophageal Comment -- [name], M.A. CCC-SLP [phone] [name] 11/28/2015, 11:54 AM              I reviewed CXR myself, basilar L>R atx  STUDIES:  2023/12/02  LHC - stent lad 12-02-2023  CT head - no acute abnormality 11/30  eeg - no seizure activity. suppression 12/03  Port CXR 12/3 - Bilat lower lung opacity.  CULTURES: Resp 12/9 >> normal flora C diff 12/12 >> positive   ANTIBIOTICS: Levaquin  11/30 >> 12/4 Vanco 12/9  >> > stop date Aztreonam 12/9 >> 12/16 Vanco PO 12/12 >>  Flagyl 12/14>>>  SIGNIFICANT EVENTS: 2023-12-02 cardiac arrest > cath lab, out to ICU, therapeutic hypothermia.  12/02 completed rewarming 12/9 Extubated 12/10 getting OOB. Still weak, kept in ICU for intensive pulmonary toilet. Added decadron for Laryngeal edema.  12/11 remarkably better. Phonation improved. Getting OOB.  12/13 reintubated for laryngeal edema.  LINES/TUBES: ETT 2023-12-02 >>>12/8>>>12/13>>> CVL 2023-12-02 >>>12/9 Left radial 12/02/23 >>>12/3  ASSESSMENT / PLAN:  PULMONARY A: Recurrent Acute Hypoxic Respiratory Failure, multifactorial due to overall illness + C diff colitis / dehydration + altered Severe CAP vs Aspiration Pneumonia  HCAP Probable post-intubation Laryngeal edema   P: Titrate O2 for sat of 88-92% IS per RT protocol Ambulate  CARDIOVASCULAR A:  Cardiac arrest - VT/VF STEMI s/p PCI 2023-12-02 ST HTN ICM w ef 25%-->now 50-55% after intervention   P:  Care per Cardiology-->hold all anti-HTN. Add back as able now that Neo off Asa, Brilinta, Lopressor & Lipitor   RENAL A:   Hypernatremia.  P:   Monitor UOP / BMP. Replace electrolytes as indicated. KVO IVF. Consider adding d5w if unable to increase his Po intake  GASTROINTESTINAL A:   Transaminitis - Shock liver. Resolving. At Risk Protein Calorie Malnutrition  Post intubation dysphagia  P:   Push PO diet as he can tolerate  Pepcid IV q12hr.  HEMATOLOGIC A:   Leukocytosis - Improving.  Suspect stress response & exacerbated by steroids Anemia  P:  Follow CBC. Antiplatelet / anticoagulation per Cardiology. Heparin [place] q8hr for DVT prophylaxis.  INFECTIOUS A:   Possible CAP HCAP C diff colitis positive  P:   Broad-spectrum antibiotics for HCAP started 12/9, finished aztreonam  PO vancomycin (for c diff), Enteral vanco started 12/12. Monitor fever curve/WBC. Adding flagyl IV seemed very effective in  controlling infection and WBC.  ENDOCRINE A:   Hyperglycemia - No H/O DM.  P:   ISS  SSI coverage.  AUTOIMMUNE:  A:  Rheumatoid Arthritis - on DMARD & Methotrexate at baseline   P:  Hold home medications  NEUROLOGIC A:   Deconditioning s/p critical illness  P:   Decrease trazodone on 12/17 D/C sedation now that patient is extubated.  FAMILY - Updates: No family bedside.   We will check on him on 12/19. Please call if we can help sooner.    Baltazar Apo, MD, PhD 11/29/2015, 7:37 AM [facility] [phone] or if no answer [phone]

## 2015-11-29 NOTE — Progress Notes (Signed)
SUBJECTIVE:  Denies pain or SOB.  Still with diarrhea   PHYSICAL EXAM Filed Vitals:   11/29/15 0530 11/29/15 0600 11/29/15 0630 11/29/15 0733  BP: 109/71 108/66 111/64 90/47  Pulse: 94 93 95 99  Temp:    98.1 F (36.7 C)  TempSrc:    Oral  Resp: 20 23 26 25   Height:      Weight:      SpO2: 98% 96% 99% 96%   General:  No acute distress Lungs:  Clear Heart:  RRR Abdomen:  Positive bowel sounds, no rebound no guarding Extremities:  No edema   LABS:  Results for orders placed or performed during the hospital encounter of 11/11/15 (from the past 24 hour(s))  Glucose, capillary     Status: Abnormal   Collection Time: 11/28/15  8:23 AM  Result Value Ref Range   Glucose-Capillary 129 (H) 65 - 99 mg/dL   Comment 1 Capillary Specimen   Glucose, capillary     Status: Abnormal   Collection Time: 11/28/15  7:56 PM  Result Value Ref Range   Glucose-Capillary 179 (H) 65 - 99 mg/dL   Comment 1 Capillary Specimen   Glucose, capillary     Status: Abnormal   Collection Time: 11/28/15 11:46 PM  Result Value Ref Range   Glucose-Capillary 143 (H) 65 - 99 mg/dL   Comment 1 Capillary Specimen   CBC     Status: Abnormal   Collection Time: 11/29/15  4:45 AM  Result Value Ref Range   WBC 14.2 (H) 4.0 - 10.5 K/uL   RBC 3.78 (L) 4.22 - 5.81 MIL/uL   Hemoglobin 11.7 (L) 13.0 - 17.0 g/dL   HCT 35.8 (L) 39.0 - 52.0 %   MCV 94.7 78.0 - 100.0 fL   MCH 31.0 26.0 - 34.0 pg   MCHC 32.7 30.0 - 36.0 g/dL   RDW 15.5 11.5 - 15.5 %   Platelets 350 150 - 400 K/uL  Basic metabolic panel     Status: Abnormal   Collection Time: 11/29/15  4:45 AM  Result Value Ref Range   Sodium 152 (H) 135 - 145 mmol/L   Potassium 3.6 3.5 - 5.1 mmol/L   Chloride 123 (H) 101 - 111 mmol/L   CO2 23 22 - 32 mmol/L   Glucose, Bld 140 (H) 65 - 99 mg/dL   BUN 20 6 - 20 mg/dL   Creatinine, Ser 0.86 0.61 - 1.24 mg/dL   Calcium 7.5 (L) 8.9 - 10.3 mg/dL   GFR calc non Af Amer >60 >60 mL/min   GFR calc Af Amer >60 >60  mL/min   Anion gap 6 5 - 15  Magnesium     Status: None   Collection Time: 11/29/15  4:45 AM  Result Value Ref Range   Magnesium 2.3 1.7 - 2.4 mg/dL  Phosphorus     Status: Abnormal   Collection Time: 11/29/15  4:45 AM  Result Value Ref Range   Phosphorus 2.2 (L) 2.5 - 4.6 mg/dL    Intake/Output Summary (Last 24 hours) at 11/29/15 [id number] Last data filed at 11/29/15 0600  Gross per 24 hour  Intake 1270.96 ml  Output   1475 ml  Net -204.04 ml     ASSESSMENT AND PLAN:    Ventricular fibrillation arrest:  Extubated and alert.   Continue supportive care.  STEMI:  Continue ASA and Brilinta.  Acute systolic CHF:  EF initially 25% on LV-gram, improved to 50-55% after intervention.   Systolic is  90 today.  Likely able to start a low dose beta blocker tomorrow.   HCAP:  Per PCCM, on vancomycin and aztreonam  C diff:  Still with diarrhea.  Continuing Vanc and Flagyl  Hypernatremia:  Encourage free water intake with thickened liquids.  Follow BMET.   [name] [facility] 11/29/2015 7:42 AM

## 2015-11-29 NOTE — Progress Notes (Signed)
Speech Language Pathology Treatment: Dysphagia  Patient Details Name: [name] MRN: [id number] DOB: 1945/12/18 Today's Date: 11/29/2015 Time: [id number] SLP Time Calculation (min) (ACUTE ONLY): 13 min  Assessment / Plan / Recommendation Clinical Impression  ST follow up for therapeutic diet tolerance.  Chart review indicated that the patient had 1 low grade temp since MBS and that lungs have been stable.  Nursing not reporting any overt issues.  Meal observation was completed and the patient continued to present, as he did during BSE, with cough post swallow given recommended diet.  The patient was unable to independently recall need for double swallow.  He required a cue for every bite/sip to perform double swallow.  At times, he was so fatigued that he was unable to initiate the second swallow.   Given that the patient's lungs have remained stable and his presentation is similar to clinical swallowing evaluation the was conducted prior to [facility] yesterday recommend continue with current diet given FULL SUPERVISION for intake to ensure use of double swallow.  ST will continue to follow.     HPI HPI: 69 year old male with PMH of RA and HTN. He presented to [facility] ED 11/29 after a witnessed cardiac arrest at home. Family initiated CPR. EMS arrived and AED was placed. Two shocks were delivered until ROSC was achieved. Total down time estimated at less than 15 minutes. Pt was intubated 11/29-12/8, and required reintubation 12/13-12/15.      SLP Plan  Continue with current plan of care     Recommendations  Diet recommendations: Dysphagia 1 (puree);Nectar-thick liquid Liquids provided via: Cup Medication Administration: Crushed with puree Supervision: Staff to assist with self feeding Compensations: Slow rate;Small sips/bites;Multiple dry swallows after each bite/sip Postural Changes and/or Swallow Maneuvers: Seated upright 90 degrees              Oral Care Recommendations: Oral  care QID Follow up Recommendations: Inpatient Rehab Plan: Continue with current plan of care  [name], [place], [place] [phone] [name] 11/29/2015, 11:19 AM

## 2015-11-29 NOTE — Progress Notes (Signed)
Pt buttocks has become reddened. Pt informed and asked if flexiseal could be placed, pt still refuses. Will continue to monitor.

## 2015-11-30 LAB — CBC
HCT: 38.8 % — ABNORMAL LOW (ref 39.0–52.0)
Hemoglobin: 12.6 g/dL — ABNORMAL LOW (ref 13.0–17.0)
MCH: 31.2 pg (ref 26.0–34.0)
MCHC: 32.5 g/dL (ref 30.0–36.0)
MCV: 96 fL (ref 78.0–100.0)
Platelets: 404 K/uL — ABNORMAL HIGH (ref 150–400)
RBC: 4.04 MIL/uL — ABNORMAL LOW (ref 4.22–5.81)
RDW: 15.7 % — ABNORMAL HIGH (ref 11.5–15.5)
WBC: 14.1 K/uL — ABNORMAL HIGH (ref 4.0–10.5)

## 2015-11-30 LAB — BASIC METABOLIC PANEL WITH GFR
Anion gap: 8 (ref 5–15)
BUN: 19 mg/dL (ref 6–20)
CO2: 22 mmol/L (ref 22–32)
Calcium: 8 mg/dL — ABNORMAL LOW (ref 8.9–10.3)
Chloride: 121 mmol/L — ABNORMAL HIGH (ref 101–111)
Creatinine, Ser: 0.87 mg/dL (ref 0.61–1.24)
GFR calc Af Amer: >60 mL/min
GFR calc non Af Amer: >60 mL/min
Glucose, Bld: 132 mg/dL — ABNORMAL HIGH (ref 65–99)
Potassium: 4 mmol/L (ref 3.5–5.1)
Sodium: 151 mmol/L — ABNORMAL HIGH (ref 135–145)

## 2015-11-30 LAB — GLUCOSE, CAPILLARY
GLUCOSE-CAPILLARY: 126 mg/dL — AB (ref 65–99)
GLUCOSE-CAPILLARY: 168 mg/dL — AB (ref 65–99)
GLUCOSE-CAPILLARY: 177 mg/dL — AB (ref 65–99)
Glucose-Capillary: 130 mg/dL — ABNORMAL HIGH (ref 65–99)
Glucose-Capillary: 163 mg/dL — ABNORMAL HIGH (ref 65–99)
Glucose-Capillary: 208 mg/dL — ABNORMAL HIGH (ref 65–99)

## 2015-11-30 NOTE — Progress Notes (Signed)
SUBJECTIVE:   Events of last night noted with agitation.  Still with diarrhea but much improved.  This morning he is alert and appropriate.     PHYSICAL EXAM Filed Vitals:   11/30/15 0400 11/30/15 0500 11/30/15 0530 11/30/15 0600  BP: 124/75  108/70 120/70  Pulse: 95  100 99  Temp: 97.8 F (36.6 C)     TempSrc: Oral     Resp: 25  24 25   Height:      Weight:  164 lb 7.4 oz (74.6 kg)    SpO2: 96%  95% 91%   General:  No acute distress Lungs:  Clear Heart:  RRR Abdomen:  Positive bowel sounds, no rebound no guarding Extremities:  No edema  Neuro:  Nonfocal  LABS:  Results for orders placed or performed during the hospital encounter of 11/11/15 (from the past 24 hour(s))  Glucose, capillary     Status: Abnormal   Collection Time: 11/29/15 11:05 AM  Result Value Ref Range   Glucose-Capillary 163 (H) 65 - 99 mg/dL   Comment 1 Notify RN   Glucose, capillary     Status: Abnormal   Collection Time: 11/29/15  3:44 PM  Result Value Ref Range   Glucose-Capillary 147 (H) 65 - 99 mg/dL   Comment 1 Capillary Specimen   Glucose, capillary     Status: Abnormal   Collection Time: 11/29/15  7:23 PM  Result Value Ref Range   Glucose-Capillary 172 (H) 65 - 99 mg/dL   Comment 1 Capillary Specimen   Glucose, capillary     Status: Abnormal   Collection Time: 11/30/15  5:15 AM  Result Value Ref Range   Glucose-Capillary 126 (H) 65 - 99 mg/dL   Comment 1 Venous Specimen   Basic metabolic panel     Status: Abnormal   Collection Time: 11/30/15  5:45 AM  Result Value Ref Range   Sodium 151 (H) 135 - 145 mmol/L   Potassium 4.0 3.5 - 5.1 mmol/L   Chloride 121 (H) 101 - 111 mmol/L   CO2 22 22 - 32 mmol/L   Glucose, Bld 132 (H) 65 - 99 mg/dL   BUN 19 6 - 20 mg/dL   Creatinine, Ser 0.87 0.61 - 1.24 mg/dL   Calcium 8.0 (L) 8.9 - 10.3 mg/dL   GFR calc non Af Amer >60 >60 mL/min   GFR calc Af Amer >60 >60 mL/min   Anion gap 8 5 - 15  CBC     Status: Abnormal   Collection Time: 11/30/15   5:45 AM  Result Value Ref Range   WBC 14.1 (H) 4.0 - 10.5 K/uL   RBC 4.04 (L) 4.22 - 5.81 MIL/uL   Hemoglobin 12.6 (L) 13.0 - 17.0 g/dL   HCT 38.8 (L) 39.0 - 52.0 %   MCV 96.0 78.0 - 100.0 fL   MCH 31.2 26.0 - 34.0 pg   MCHC 32.5 30.0 - 36.0 g/dL   RDW 15.7 (H) 11.5 - 15.5 %   Platelets 404 (H) 150 - 400 K/uL    Intake/Output Summary (Last 24 hours) at 11/30/15 0736 Last data filed at 11/30/15 0600  Gross per 24 hour  Intake 1317.83 ml  Output    475 ml  Net 842.83 ml     ASSESSMENT AND PLAN:    Ventricular fibrillation arrest:  Extubated and alert.   Continue supportive care.  I am going to keep him in the ICU.  PT has been consulted but not yet worked  with him.  We will contact them.    Hypernatremia:  This could be related to increased sodium loss with his diarrhea.  However, it is like iatrogenic with significant saline infusion during this ICU stay.   The patient is also significantly hyperchloremic.  I requested that nursing increase the patients PO free water intake.     STEMI:  Continue ASA and Brilinta.  Acute systolic CHF:  EF initially 25% on LV-gram, improved to 50-55% after intervention.  BP is slightly better.  I will start low dose beta blocker.   HCAP:  Per PCCM.  Finished aztreoman.  C diff:  Still with diarrhea but improved.  Continuing Vanc and Flagyl.  Day number 7.  Confer with ID prior to discharge.  Likely two week course and then repeat C diff toxin possibly.      Minus Breeding 11/30/2015 7:36 AM

## 2015-11-30 NOTE — Progress Notes (Signed)
At 4:30 patient became very agitated at tried climbing over the side rails of the bed. He was paranoid that there was a "conspiracy going on here" and pointing to his monitoring equipment (BP cuff, O2 probe, and continuous ECG) and was adamant that he was going to take a shower. The patient does not understand his physical limitation due to generalized weakness and tried to stand up on his own several times. He was not able to support his body weight while standing and needed assistance by two people. We were able to calm him and reposition him to a chair.  Patient did not sleep at all during the night and his restlessness has been increasing.

## 2015-12-01 ENCOUNTER — Encounter (HOSPITAL_COMMUNITY): Payer: Self-pay | Admitting: Physical Medicine and Rehabilitation

## 2015-12-01 DIAGNOSIS — D75839 Thrombocytosis, unspecified: Secondary | ICD-10-CM | POA: Insufficient documentation

## 2015-12-01 DIAGNOSIS — E870 Hyperosmolality and hypernatremia: Secondary | ICD-10-CM

## 2015-12-01 DIAGNOSIS — D72829 Elevated white blood cell count, unspecified: Secondary | ICD-10-CM

## 2015-12-01 DIAGNOSIS — D473 Essential (hemorrhagic) thrombocythemia: Secondary | ICD-10-CM

## 2015-12-01 DIAGNOSIS — R0682 Tachypnea, not elsewhere classified: Secondary | ICD-10-CM | POA: Insufficient documentation

## 2015-12-01 DIAGNOSIS — I10 Essential (primary) hypertension: Secondary | ICD-10-CM

## 2015-12-01 DIAGNOSIS — D649 Anemia, unspecified: Secondary | ICD-10-CM

## 2015-12-01 DIAGNOSIS — R1310 Dysphagia, unspecified: Secondary | ICD-10-CM

## 2015-12-01 LAB — GLUCOSE, CAPILLARY
GLUCOSE-CAPILLARY: 114 mg/dL — AB (ref 65–99)
GLUCOSE-CAPILLARY: 233 mg/dL — AB (ref 65–99)
Glucose-Capillary: 126 mg/dL — ABNORMAL HIGH (ref 65–99)
Glucose-Capillary: 145 mg/dL — ABNORMAL HIGH (ref 65–99)

## 2015-12-01 MED ORDER — METOPROLOL TARTRATE 12.5 MG HALF TABLET
12.5000 mg | ORAL_TABLET | Freq: Two times a day (BID) | ORAL | Status: DC
  Administered 2015-12-01 – 2015-12-04 (×7): 12.5 mg via ORAL
  Filled 2015-12-01 (×7): qty 1

## 2015-12-01 NOTE — Progress Notes (Signed)
Patient had multiple episodes of diarrhea over night. He has been in chair several times during the night, but gets uncomfortable in chair as well as in bed. He did not sleep at all last night. No confusion noted, but states that he "just can't get comfortable anywhere".

## 2015-12-01 NOTE — Progress Notes (Deleted)
Physical medicine rehabilitation consult requested chart reviewed. Await formal physical and occupational therapy evaluations to be completed after recent intubation. Follow-up formal rehabilitation consult and appropriate recommendations

## 2015-12-01 NOTE — Consult Note (Signed)
Physical Medicine and Rehabilitation Consult  Reason for Consult: Debility after cardiac arrest with multiple medical issues Referring Physician: Dr. Etter Sjogren   HPI: [name] is a 69 y.o. male with history of RA, impaired glucose tolerance, HTN who was admitted to [facility] on 11/11/15 with 2 week history of intermittent CP and  witnessed cardiac arrest.  Family initiated CPR, he was treated with ACLS protocol and EKG in field showed evidence of NSTEMI. He was unresponsive and intubated in ED and taken to cath lab emergently. Cardiac cath revealed occlusive LAD lesion which was treated with PCI and DES by Dr.  Burt Knack and was treated with hypothermia protocol.  Hospital course significant for respiratory failure due to HCAP as well as laryngeal edema post extubation 12/08 requiring reintubation on 12/13 due to increase in WOB and lethargy.  Diarrhea due to c diff colitis treated with vanc and flagyl with improvement in leucocytosis. He tolerated extubation on 12/15 and was started on dysphagia 2, nectar liquids due to dysphagia.  Patient with resultant weakness with inability to ambulate as well as cognitive deficits requiring multiple cues to maintain safe swallow strategies. CIR recommended for follow up therapy.   Review of Systems  Constitutional: Positive for malaise/fatigue.  HENT: Positive for sore throat. Negative for hearing loss.   Eyes: Negative for blurred vision and double vision.  Respiratory: Positive for cough. Negative for shortness of breath and wheezing.   Cardiovascular: Positive for chest pain (MSK).  Gastrointestinal: Positive for diarrhea. Negative for heartburn, nausea and abdominal pain.  Genitourinary: Negative for dysuria and frequency.  Musculoskeletal: Positive for back pain and joint pain (diffuse joint pain--left pelvis worse since fracture this year. ). Negative for myalgias.  Neurological: Positive for speech change and weakness. Negative for dizziness,  tingling and headaches.  Psychiatric/Behavioral: Positive for memory loss.  All other systems reviewed and are negative.   Past Medical History  Diagnosis Date  . Rheumatoid arthritis(714.0) 1998  . Nephrolithiasis 1968  . Insomnia   . Allergic rhinitis   . Impaired glucose tolerance   . Hypertension   . ST elevation (STEMI) myocardial infarction involving left anterior descending coronary artery ([place]) 11/11/2015    100% LAD, Cardiogenic Shock. -- EF ~20-25%  . Cardiac arrest with ventricular fibrillation ([place]) 11/11/2015  . CAD S/P PCI OF pLAD with 3.5 mm x38 mm Promus DES (3.75 mm) 11/12/2015  . Cardiomyopathy, ischemic - EF 20-25% by LV Gram 11/12/2015    There is severe left ventricular systolic dysfunction. The left ventricular ejection fraction is 25-35% by visual estimate. There are wall motion abnormalities in the left ventricle. There are segmental wall motion abnormalities in the left ventricle. The anterolateral and periapical LV walls are akinetic with an estimated LVEF of 25%     Past Surgical History  Procedure Laterality Date  . Status post multiple lithotripy and urological surgery for reccurent calcium oxalate stones    . Cardiac catheterization N/A 11/11/2015    Procedure: Left Heart Cath and Cors/Grafts Angiography;  Surgeon: Sherren Mocha, MD;  Location: [place] [facility];  Service: Cardiovascular;  Laterality: N/A;  . Cardiac catheterization N/A 11/11/2015    Procedure: Coronary Stent Intervention;  Surgeon: Sherren Mocha, MD;  Location: [place] [facility];  Service: Cardiovascular;  Laterality: N/A;  prox lad 3.5x38 promus    Family History  Problem Relation Age of Onset  . Cancer Mother     pancreatic ; pulmonary embolism   . Diabetes Father  Social History:  Married. 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. Wife works out of home and can provide intermittent care after discharge.  He reports that he has quit smoking 30+ years ago. He has never  used smokeless tobacco. He reports that he drinks alcohol couple times a year.  He does not use illicit drugs.  Allergies  Allergen Reactions  . Contrast Media [Iodinated Diagnostic Agents] Hives    03/28/14 pt received 1 hour emergent prep and pt did good and had no complaints after scan.  . Ibuprofen     REACTION: unspecified  . Penicillins     REACTION: unspecified  . Sulfamethoxazole     REACTION: unspecified     Medications Prior to Admission  Medication Sig Dispense Refill  . Cetirizine HCl 10 MG CAPS Take 10 mg by mouth daily.    . Cholecalciferol (VITAMIN D-3) 1000 UNITS CAPS Take 2,000 Units by mouth daily.     . Coenzyme Q10 (CO Q 10) 10 MG CAPS Take 10 mg by mouth daily.    . fexofenadine (ALLEGRA) 30 MG tablet Take 30 mg by mouth daily.    . fluticasone (FLONASE) 50 MCG/ACT nasal spray USE 2 SPRAYS INTO NOSE DAILY 48 g 3  . folic acid (FOLVITE) 1 MG tablet Take 1 mg by mouth daily.      . Golimumab ([place] ARIA IV) Inject into the vein. Infusion every 8 weeks    . hydrochlorothiazide (HYDRODIURIL) 25 MG tablet Take 1 tablet daily (Patient taking differently: Take 25 mg by mouth daily) 90 tablet 1  . HYDROcodone-acetaminophen (NORCO/VICODIN) 5-325 MG per tablet Take 1-2 tablets by mouth every 6 (six) hours as needed for moderate pain. 20 tablet 0  . meloxicam (MOBIC) 7.5 MG tablet Take 1 tablet (7.5 mg total) by mouth 2 (two) times daily. 90 tablet 3  . methotrexate (RHEUMATREX) 2.5 MG tablet Take 20 mg by mouth once a week. 8 tablets every week on Sunday Morning Caution:Chemotherapy. Protect from light.    . Probiotic Product (PRO-BIOTIC BLEND PO) Take 1 tablet by mouth daily.    . saw palmetto 160 MG capsule Take 320 mg by mouth daily.     . tamsulosin (FLOMAX) 0.4 MG CAPS capsule Take 1 capsule (0.4 mg total) by mouth daily. 90 capsule 0  . traMADol (ULTRAM) 50 MG tablet Take 50 mg by mouth every 6 (six) hours as needed for moderate pain.    . traZODone (DESYREL) 100 MG  tablet Take 1 tablet (100 mg total) by mouth at bedtime. 90 tablet 1    Home: Home Living Family/patient expects to be discharged to:: Private residence Living Arrangements: Spouse/significant other Available Help at Discharge: Friend(s), Available PRN/intermittently (wife works from home but she is upstairs and pt down) Type of Home: House Home Access: Stairs to enter Technical brewer of Steps: 4 Entrance Stairs-Rails: Right [place]: Two level, Able to live on main level with bedroom/bathroom Bathroom Shower/Tub: Chiropodist: Standard Home Equipment: Environmental consultant - 4 wheels Additional Comments: Wife states in April when pt injured, pt stayed on first level and borrowed a 3N1 and slept in [place].  He was able to get to his 3N1 without her help.  She works upstairs in her office and cannot provide 24/7 care to him.    Functional History: Prior Function Level of Independence: Independent Comments: did part time work for [employer], wife can work from home Functional Status:  Mobility: Bed Mobility Overal bed mobility: Needs Assistance  Bed Mobility: Rolling, Sit to Supine Rolling: Min assist Sidelying to sit: Max assist Sit to supine: Min assist, +2 for physical assistance General bed mobility comments: pt in chair on arrival Transfers Overall transfer level: Needs assistance Equipment used: Rolling walker (2 wheeled) Transfers: Sit to/from Stand Sit to Stand: Mod assist, +2 physical assistance Squat pivot transfers: Max assist, Total assist, +2 physical assistance General transfer comment: Took a few attempts to power up needing assist.  once up pt unsteady.   Ambulation/Gait General Gait Details:  pt unable to ambulate at this time    ADL:    Cognition: Cognition Overall Cognitive Status: Impaired/Different from baseline Orientation Level: Oriented X4 Cognition Arousal/Alertness: Awake/alert Behavior During Therapy: Anxious, Restless Overall  Cognitive Status: Impaired/Different from baseline Area of Impairment: Orientation, Following commands, Safety/judgement, Problem solving Orientation Level: Disoriented to, Situation, Place Following Commands: Follows one step commands inconsistently, Follows one step commands with increased time Safety/Judgement: Decreased awareness of safety, Decreased awareness of deficits Problem Solving: Decreased initiation, Difficulty sequencing, Requires verbal cues, Requires tactile cues, Slow processing General Comments: very slow to respond to mobility commands as if planning issue, but pt also globally weak; stool incontinence continues with poor awareness.  Poor awareness of deficits by pt.    Blood pressure 108/63, pulse 96, temperature 97.4 F (36.3 C), temperature source Oral, resp. rate 23, height 5\' 7"  (1.702 m), weight 77.6 kg (171 lb 1.2 oz), SpO2 96 %. Physical Exam  Nursing note and vitals reviewed. Constitutional: He is oriented to person, place, and time. He appears well-developed and well-nourished.  HENT:  Head: Normocephalic and atraumatic.  Mouth/Throat: Oropharynx is clear and moist.  Eyes: Conjunctivae and EOM are normal. Pupils are equal, round, and reactive to light.  Neck: Normal range of motion. Neck supple.  Cardiovascular: Normal rate and regular rhythm.   Respiratory: Effort normal and breath sounds normal. He exhibits tenderness.  GI: Soft. Bowel sounds are normal. He exhibits no distension. There is no tenderness.  Musculoskeletal: He exhibits tenderness (Chest wall). He exhibits no edema.  Neurological: He is alert and oriented to person, place, and time.  Dysphonic speech.  Able to follow basic motor commands without difficulty.   Sensation intact to light touch Motor: B/L UE: 4 -/5 proximal distal B/LE: 4+/5 proximal to distal  Skin: Skin is warm and dry.  Dressings C/D/I \ Scattered bruising  Psychiatric: Cognition and memory are impaired.    Results for  orders placed or performed during the hospital encounter of 11/11/15 (from the past 24 hour(s))  Glucose, capillary     Status: Abnormal   Collection Time: 11/30/15  4:38 PM  Result Value Ref Range   Glucose-Capillary 177 (H) 65 - 99 mg/dL  Glucose, capillary     Status: Abnormal   Collection Time: 11/30/15  8:46 PM  Result Value Ref Range   Glucose-Capillary 208 (H) 65 - 99 mg/dL  Glucose, capillary     Status: Abnormal   Collection Time: 11/30/15 11:42 PM  Result Value Ref Range   Glucose-Capillary 163 (H) 65 - 99 mg/dL  Glucose, capillary     Status: Abnormal   Collection Time: 12/01/15  3:00 AM  Result Value Ref Range   Glucose-Capillary 126 (H) 65 - 99 mg/dL  Glucose, capillary     Status: Abnormal   Collection Time: 12/01/15  7:54 AM  Result Value Ref Range   Glucose-Capillary 145 (H) 65 - 99 mg/dL   Comment 1 Capillary Specimen   Glucose, capillary  Status: Abnormal   Collection Time: 12/01/15 12:27 PM  Result Value Ref Range   Glucose-Capillary 233 (H) 65 - 99 mg/dL   No results found.  Assessment/Plan: Diagnosis: Debility after cardiac arrest Labs and images independently reviewed.  Records reviewed and summated above.  1. Does the need for close, 24 hr/day medical supervision in concert with the patient's rehab needs make it unreasonable for this patient to be served in a less intensive setting? Yes  2. Co-Morbidities requiring supervision/potential complications: dysphagia (cont SLP and advance diet as tolerated), Diarrhea (improving, continue to monitor), RA (ensure pain does not limit therapies, monitor for signs of flares), impaired glucose tolerance (continue to monitor CBGs, adjust medications in accordance with increased activity), HTN (currently labile monitor and provide prns in accordance with increased physical exertion and pain), tachypnea (monitor with increased physical activity) hypernatremia (trending down, continue to monitor), leukocytosis (trending  down continue to monitor), anemia (transfuse if necessary to ensure appropriate perfusion for increased activity tolerance), thrombocytosis (likely reactive, continue to monitor), CHF (Monitor in accordance with increased physical activity and avoid UE resistance exercises) 3. Due to safety, skin/wound care, disease management, medication administration, pain management and patient education, does the patient require 24 hr/day rehab nursing? Yes 4. Does the patient require coordinated care of a physician, rehab nurse, PT (1-2 hrs/day, 5 days/week), OT (1-2 hrs/day, 5 days/week) and SLP (1-2 hrs/day, 5 days/week) to address physical and functional deficits in the context of the above medical diagnosis(es)? Yes Addressing deficits in the following areas: balance, endurance, locomotion, strength, transferring, bathing, dressing, grooming, toileting, speech, swallowing and psychosocial support 5. Can the patient actively participate in an intensive therapy program of at least 3 hrs of therapy per day at least 5 days per week? Yes 6. The potential for patient to make measurable gains while on inpatient rehab is excellent 7. Anticipated functional outcomes upon discharge from inpatient rehab are modified independent and supervision  with PT, modified independent and supervision with OT, independent and modified independent with SLP. 8. Estimated rehab length of stay to reach the above functional goals is: 13-16 days. 9. Does the patient have adequate social supports and living environment to accommodate these discharge functional goals? Yes 10. Anticipated D/C setting: Home 11. Anticipated post D/C treatments: HH therapy and Home excercise program 12. Overall Rehab/Functional Prognosis: good  RECOMMENDATIONS: This patient's condition is appropriate for continued rehabilitative care in the following setting: CIR Patient has agreed to participate in recommended program. Yes Note that insurance prior  authorization may be required for reimbursement for recommended care.  Comment: Rehab Admissions Coordinator to follow up  Delice Lesch, MD 12/01/2015

## 2015-12-01 NOTE — Progress Notes (Signed)
Speech Language Pathology Treatment: Dysphagia  Patient Details Name: [name] MRN: [id number] DOB: 04-24-1946 Today's Date: 12/01/2015 Time: 1030-1105 SLP Time Calculation (min) (ACUTE ONLY): 35 min  Assessment / Plan / Recommendation Clinical Impression  Skilled treatment session focused on addressing dysphagia goals.  SLP facilitated session with set-up of puree textures and nectar-thick liquids via cup as well as Total assist to recall need for multiple swallows with each bite and sip and Mod verbal cues to utilize throughout session.  Spouse, Debbie present.  She and patient were educated on rationale for and cuing strategies for carryover.  Trials of thin liquids continue to elicit immediate throat clears and coughs.  Patient reports consuming regular textures (Graham crackers) this am; however, trials of regular textures followed by nectar-thick liquid cup sips reveal intermittent throat clears despite use of multiple swallows.  Recommend diet initiation of Dys.2 textures with continued need for nectar-thick liquids and full supervision.  RN made aware.     HPI HPI: 69 year old male with PMH of RA and HTN. He presented [facility] ED [facility]/29 after a witnessed cardiac arrest at home. Family initiated CPR. EMS arrived and AED was placed. Two shocks were delivered until ROSC was achieved. Total down time estimated at less than 15 minutes. Pt was intubated 11/29-12/8, and required reintubation 12/13-12/15.      SLP Plan  Continue with current plan of care     Recommendations  Diet recommendations: Dysphagia 2 (fine chop);Nectar-thick liquid Liquids provided via: Cup;No straw Medication Administration: Crushed with puree Supervision: Patient able to self feed;Full supervision/cueing for compensatory strategies Compensations: Slow rate;Small sips/bites;Multiple dry swallows after each bite/sip;Minimize environmental distractions Postural Changes and/or Swallow Maneuvers: Seated  upright 90 degrees              General recommendations: Rehab consult Oral Care Recommendations: Oral care BID Follow up Recommendations: Inpatient Rehab;24 hour supervision/assistance Plan: Continue with current plan of care  Carmelia Roller., CCC-SLP [id number]  [place] 12/01/2015, 11:18 AM

## 2015-12-01 NOTE — Progress Notes (Signed)
Subjective:  Some chest soreness - CPR. No SOB. Diarrhea improved. Sore buttocks. Weak. Ready to work with PT.  Objective:  Vital Signs in the last 24 hours: Temp:  [97.6 F (36.4 C)-98.9 F (37.2 C)] 97.6 F (36.4 C) (12/19 0757) Pulse Rate:  [44-138] 104 (12/19 0757) Resp:  [15-26] 16 (12/19 0757) BP: (85-125)/(48-96) 124/96 mmHg (12/19 0700) SpO2:  [92 %-100 %] 98 % (12/19 0700) Weight:  [171 lb 1.2 oz (77.6 kg)] 171 lb 1.2 oz (77.6 kg) (12/19 0301)  Intake/Output from previous day: 12/18 0701 - 12/19 0700 In: 51 [P.O.:770; IV Piggyback:300] Out: 800 [Urine:800]   Physical Exam: General: Well developed, well nourished, in no acute distress. Head:  Normocephalic and atraumatic. Lungs: Clear to auscultation and percussion. Heart: Normal S1 and S2.  No murmur, rubs or gallops.  Abdomen: soft, non-tender, positive bowel sounds. Extremities: No clubbing or cyanosis. No edema. Neurologic: Alert and oriented x 3.    Lab Results:  Recent Labs  11/29/15 0445 11/30/15 0545  WBC 14.2* 14.1*  HGB 11.7* 12.6*  PLT 350 404*    Recent Labs  11/29/15 0445 11/30/15 0545  NA 152* 151*  K 3.6 4.0  CL 123* 121*  CO2 23 22  GLUCOSE 140* 132*  BUN 20 19  CREATININE 0.86 0.87     Telemetry: No adverse rhythms. NSR/ sinus tach 106. Personally viewed.   Cardiac Studies:  EF 50% (was 25% cath)  Meds: Scheduled Meds: . antiseptic oral rinse  7 mL Mouth Rinse BID  . aspirin  81 mg Oral Daily  . atorvastatin  80 mg Oral q1800  . enoxaparin (LOVENOX) injection  40 mg Subcutaneous Q24H  . famotidine  20 mg Oral BID  . feeding supplement (ENSURE ENLIVE)  237 mL Oral BID BM  . fluticasone  2 spray Each Nare Daily  . guaiFENesin  15 mL Per Tube 6 times per day  . metronidazole  500 mg Intravenous Q8H  . tamsulosin  0.4 mg Oral Daily  . ticagrelor  90 mg Oral BID  . traZODone  50 mg Oral QHS  . vancomycin  500 mg Oral 4 times per day   Continuous Infusions: .  sodium chloride Stopped (11/30/15 1000)   PRN Meds:.acetaminophen, fentaNYL (SUBLIMAZE) injection, Gerhardt's butt cream, ondansetron (ZOFRAN) IV  Assessment/Plan:  Principal Problem:   Cardiac arrest with ventricular fibrillation ([name]) Active Problems:   Rheumatoid arthritis ([name])   ST elevation (STEMI) myocardial infarction involving left anterior descending coronary artery ([name])   Acute respiratory failure with hypoxia ([name])   Shock circulatory ([name])   Coma ([name])   CAD S/P PCI OF pLAD with 3.5 mm x38 mm Promus DES (3.75 mm)   Cardiomyopathy, ischemic - EF 20-25% by LV Gram   Acute delirium   Hypokalemia   Cough   HCAP (healthcare-associated pneumonia)   Diarrhea of presumed infectious origin   Difficult intravenous access   Ventricular fibrillation arrest:  - Alert, good recovery - Transfer to floor   Hypernatremia:  - This could be related to increased sodium loss with his diarrhea. However, it is like iatrogenic with significant saline infusion during this ICU stay.  - The patient is also significantly hyperchloremic. -  increase PO free water intake.   STEMI:  - Continue ASA and Brilinta. - Total occlusion of proximal LAD - DES - Non obstructive Circ and RCA - EF was 25% but improved to 50% on ECHO  Acute systolic CHF:  - EF  initially 25% on LV-gram, improved to 50-55% after intervention.  - BP is slightly better.  - start beta blocker, metoprolol 12.5 BID  HCAP:  - Per PCCM. Finished aztreoman.  - They have signed off.   C diff:  - diarrhea improved.  - Continuing Vanc PO and Flagyl IV. Day number 8.  - Per Dr. Lamonte Sakai continue IV Flagyl while here in hospital and when [place], continue PO Vanc for a total of 2 weeks. (Does not need IV Flagyl at home in other words).   [name], [name] 12/01/2015, 8:29 AM

## 2015-12-01 NOTE — Progress Notes (Signed)
Rehab Admissions Coordinator Note:  Patient was screened by Retta Diones for appropriateness for an Inpatient Acute Rehab Consult.  At this time, we are recommending Inpatient Rehab consult.  Retta Diones 12/01/2015, 12:50 PM  I can be reached at [phone].

## 2015-12-01 NOTE — Evaluation (Signed)
Physical Therapy Evaluation Patient Details Name: [name] MRN: [id number] DOB: June 24, 1946 Today's Date: 12/01/2015   History of Present Illness  69 y.o. male w/ PMHx significant for rheumatoid arthritis and HTN who presented to [facility] on 11/11/2015 after an out-of-hospital cardiac arrest. The patient reportedly had chest and arm pain intermittently for 2 weeks after sustaining a mechanical fall. Today he had worsening of his chest pain and he ultimately arrested at home, witnessed by his wife. First responder delivered 2 shocks via an AED and the patient had ROSC with this. An EKG in the field was suggestive of acute anterior STEMI  Patient intubated 11/29-12/8.  Pt reintubated 12/13 for laryngeal edema and PT signed off.  Pt extubated 12/16.  PT reordered 12/16 over weekend.    Clinical Impression  Pt admitted with above diagnosis. Pt currently with functional limitations due to the deficits listed below (see PT Problem List). Pt was able to ambulate a short distance but with mod assist due to weakness all 4 extremities as well as poor safety awareness.  Wife present and concerned that pt cannot come home at current level due to she works upstairs in her office and he will have to stay on ground floor at home and needs to be at supervision level prior to d/c home.  Pt has had multiple complications while here and needs to incr strength and endurance prior to d/c home with wife.  Feel pt is a good rehab candidate.  Will continue acute PT.   Pt will benefit from skilled PT to increase their independence and safety with mobility to allow discharge to the venue listed below.      Follow Up Recommendations CIR    Equipment Recommendations  Other (comment) (TBA)    Recommendations for Other Services Rehab consult     Precautions / Restrictions Precautions Precautions: Fall Restrictions Weight Bearing Restrictions: No      Mobility  Bed Mobility Overal bed mobility:  Needs Assistance Bed Mobility: Rolling;Sit to Supine Rolling: Min assist     Sit to supine: Min assist;+2 for physical assistance   General bed mobility comments: pt in chair on arrival  Transfers Overall transfer level: Needs assistance Equipment used: Rolling walker (2 wheeled) Transfers: Sit to/from Stand Sit to Stand: Mod assist;+2 physical assistance         General transfer comment: Took a few attempts to power up needing assist.  once up pt unsteady on feet.    Ambulation/Gait Ambulation/Gait assistance: Mod assist;+2 physical assistance Ambulation Distance (Feet): 12 Feet Assistive device: Rolling walker (2 wheeled) Gait Pattern/deviations: Step-through pattern;Decreased stride length;Trunk flexed;Staggering left;Staggering right;Leaning posteriorly   Gait velocity interpretation: Below normal speed for age/gender General Gait Details: Pt began ambulation and needed cues and assist to sequence steps and to steer rW.  At one point, pts right hand slipped off RW and pt needed mod assist to recover balance.  Pt generally unsteady with several LOB during ambulation in which pt needed assist to recover.  Pt lacks safety awareness to self correct.      Stairs            Wheelchair Mobility    Modified Rankin (Stroke Patients Only)       Balance Overall balance assessment: Needs assistance;History of Falls Sitting-balance support: No upper extremity supported;Feet supported Sitting balance-Leahy Scale: Fair     Standing balance support: Bilateral upper extremity supported;During functional activity Standing balance-Leahy Scale: Poor Standing balance comment: Relies on UE support  and cuing as well as assist to maintain static standing.               High level balance activites: Direction changes;Turns High Level Balance Comments: mod assist with above challenges to balance.               Pertinent Vitals/Pain Pain Assessment: Faces Faces Pain  Scale: Hurts little more Pain Location: generalized Pain Descriptors / Indicators: Grimacing;Guarding;Aching;Sore Pain Intervention(s): Limited activity within patient's tolerance;Monitored during session;Repositioned  VSS    Home Living Family/patient expects to be discharged to:: Private residence Living Arrangements: Spouse/significant other Available Help at Discharge: Friend(s);Available PRN/intermittently (wife works from home but she is upstairs and pt down) Type of Home: House Home Access: Stairs to enter Entrance Stairs-Rails: Right Entrance Stairs-Number of Steps: 4 Home Layout: Two level;Able to live on main level with bedroom/bathroom Home Equipment: Gilford Rile - 4 wheels Additional Comments: Wife states in April when pt injured, pt stayed on first level and borrowed a 3N1 and slept in [place].  He was able to get to his 3N1 without her help.  She works upstairs in her office and cannot provide 24/7 care to him.      Prior Function Level of Independence: Independent               Hand Dominance   Dominant Hand: Right    Extremity/Trunk Assessment   Upper Extremity Assessment: Defer to OT evaluation           Lower Extremity Assessment: RLE deficits/detail;LLE deficits/detail RLE Deficits / Details: grossly 3/5 LLE Deficits / Details: grossly 3/5  Cervical / Trunk Assessment: Kyphotic  Communication   Communication: Expressive difficulties (whispers/low volume)  Cognition Arousal/Alertness: Awake/alert Behavior During Therapy: Anxious;Restless Overall Cognitive Status: Impaired/Different from baseline Area of Impairment: Orientation;Following commands;Safety/judgement;Problem solving Orientation Level: Disoriented to;Situation;Place     Following Commands: Follows one step commands inconsistently;Follows one step commands with increased time Safety/Judgement: Decreased awareness of safety;Decreased awareness of deficits   Problem Solving: Decreased  initiation;Difficulty sequencing;Requires verbal cues;Requires tactile cues;Slow processing General Comments: very slow to respond to mobility commands as if planning issue, but pt also globally weak; stool incontinence continues with poor awareness.  Poor awareness of deficits by pt.     General Comments General comments (skin integrity, edema, etc.): Pt had BM on pad when he stood and total assist to clean pt.      Exercises General Exercises - Upper Extremity Shoulder Flexion: AROM;Both;5 reps;Supine Elbow Flexion: AROM;Both;10 reps;Supine Wrist Flexion: AROM;Both;5 reps;Supine Wrist Extension: AROM;Both;5 reps;Supine General Exercises - Lower Extremity Ankle Circles/Pumps: AROM;Both;10 reps;Seated Quad Sets: AROM;Both;5 reps;Supine Long Arc Quad: AROM;Both;10 reps;Seated Heel Slides: Both;10 reps;Supine;AAROM Hip ABduction/ADduction: Both;AAROM;10 reps;Supine      Assessment/Plan    PT Assessment Patient needs continued PT services  PT Diagnosis Generalized weakness   PT Problem List Decreased strength;Decreased activity tolerance;Decreased cognition;Decreased safety awareness;Decreased knowledge of use of DME;Decreased balance;Decreased mobility;Decreased coordination;Cardiopulmonary status limiting activity  PT Treatment Interventions DME instruction;Balance training;Gait training;Neuromuscular re-education;Functional mobility training;Cognitive remediation;Patient/family education;Therapeutic activities;Therapeutic exercise   PT Goals (Current goals can be found in the Care Plan section) Acute Rehab PT Goals Patient Stated Goal: To get stronger PT Goal Formulation: With patient Time For Goal Achievement: 12/15/15 Potential to Achieve Goals: Good    Frequency Min 3X/week   Barriers to discharge Decreased caregiver support (wife cannot provide 24 hour assist but can do supervision)      Co-evaluation  End of Session Equipment Utilized During  Treatment: Gait belt;Oxygen Activity Tolerance: Patient limited by fatigue Patient left: with call bell/phone within reach;in bed;with bed alarm set;with family/visitor present Nurse Communication: Mobility status         Time: [id number] PT Time Calculation (min) (ACUTE ONLY): 38 min   Charges:   PT Evaluation $Initial PT Evaluation Tier I: 1 Procedure PT Treatments $Gait Training: 8-22 mins $Therapeutic Exercise: 8-22 mins   PT G CodesIrwin Brakeman F 23-Dec-2015, 2:05 PM Maclovia Uher,PT Acute Rehabilitation [phone] (pager)

## 2015-12-01 NOTE — Progress Notes (Signed)
PCCM Interval Note  Patient improving, particularly MS. Still with diarrhea due to C diff colitis. Aztreonam has been finished, so hopefully he will begin to recover. Recommendation for PO vanco + IV flagyl is 2 weeks total therapy. Should not need repeat stool studies unless continues to have diarrhea. PCCM will sign off, please call if we can help you.   Baltazar Apo, MD, PhD 12/01/2015, 8:24 AM [facility] [phone] or if no answer [phone]

## 2015-12-02 MED ORDER — LORATADINE 10 MG PO TABS
10.0000 mg | ORAL_TABLET | Freq: Every day | ORAL | Status: DC
  Administered 2015-12-02 – 2015-12-04 (×3): 10 mg via ORAL
  Filled 2015-12-02 (×3): qty 1

## 2015-12-02 NOTE — Progress Notes (Signed)
Cardiologist: Dr. Burt Knack performed cardiac catheterization  Subjective:  Some chest soreness - CPR. No SOB. Diarrhea improved. Sore buttocks. Weak.   Jackelyn Poling - spouse. (They had just installed a new speaker system further television and were about to watch a movie when he made a loud gasp and collapsed on the couch. She immediately began CPR. 911, defibrillation 2.)  Objective:  Vital Signs in the last 24 hours: Temp:  [97.4 F (36.3 C)-99.1 F (37.3 C)] 98.1 F (36.7 C) (12/20 0656) Pulse Rate:  [93-104] 99 (12/20 1057) Resp:  [18-23] 18 (12/20 0656) BP: (107-117)/(56-68) 111/68 mmHg (12/20 1057) SpO2:  [96 %-99 %] 98 % (12/20 0656)  Intake/Output from previous day: 12/19 0701 - 12/20 0700 In: 77 [P.O.:720; IV Piggyback:100] Out: 650 [Urine:650]   Physical Exam: General: Well developed, well nourished, in no acute distress. Head:  Normocephalic and atraumatic. Lungs: Clear to auscultation and percussion. Heart: Normal S1 and S2.  No murmur, rubs or gallops. Mild tenderness of chest wall. Abdomen: soft, non-tender, positive bowel sounds. Extremities: No clubbing or cyanosis. No edema. Neurologic: Alert, laying in bed.    Lab Results:  Recent Labs  11/30/15 0545  WBC 14.1*  HGB 12.6*  PLT 404*    Recent Labs  11/30/15 0545  NA 151*  K 4.0  CL 121*  CO2 22  GLUCOSE 132*  BUN 19  CREATININE 0.87     Telemetry: No adverse rhythms. NSR/ sinus tach 102. Personally viewed.   Cardiac Studies:  EF 50% (was 25% cath)  Meds: Scheduled Meds: . antiseptic oral rinse  7 mL Mouth Rinse BID  . aspirin  81 mg Oral Daily  . atorvastatin  80 mg Oral q1800  . enoxaparin (LOVENOX) injection  40 mg Subcutaneous Q24H  . famotidine  20 mg Oral BID  . feeding supplement (ENSURE ENLIVE)  237 mL Oral BID BM  . fluticasone  2 spray Each Nare Daily  . guaiFENesin  15 mL Per Tube 6 times per day  . metoprolol tartrate  12.5 mg Oral BID  . metronidazole  500 mg Intravenous  Q8H  . tamsulosin  0.4 mg Oral Daily  . ticagrelor  90 mg Oral BID  . traZODone  50 mg Oral QHS  . vancomycin  500 mg Oral 4 times per day   Continuous Infusions: . sodium chloride Stopped (11/30/15 1000)   PRN Meds:.acetaminophen, fentaNYL (SUBLIMAZE) injection, Gerhardt's butt cream, ondansetron (ZOFRAN) IV  Assessment/Plan:  Principal Problem:   Cardiac arrest with ventricular fibrillation ([name]) Active Problems:   Rheumatoid arthritis ([name])   ST elevation (STEMI) myocardial infarction involving left anterior descending coronary artery ([name])   Acute respiratory failure with hypoxia ([name])   Shock circulatory ([name])   Coma ([name])   CAD S/P PCI OF pLAD with 3.5 mm x38 mm Promus DES (3.75 mm)   Cardiomyopathy, ischemic - EF 20-25% by LV Gram   Acute delirium   Hypokalemia   Cough   HCAP (healthcare-associated pneumonia)   Diarrhea of presumed infectious origin   Difficult intravenous access   Dysphagia   Tachypnea   Hypernatremia   Leukocytosis   Absolute anemia   Thrombocytosis ([name])   Ventricular fibrillation arrest:  - Alert, good recovery - Doing well on floor - Wife notes mild confusion at times. Overall improved.   Hypernatremia:  - This could be related to increased sodium loss with his diarrhea. However, it is like iatrogenic with significant saline infusion during this ICU stay.  - The  patient is also significantly hyperchloremic. -  increase PO free water intake.  - Check basic metabolic profile again in a.m.  STEMI:  - Continue ASA and Brilinta. - Total occlusion of proximal LAD - DES - Non obstructive Circ and RCA - EF was 25% but improved to 50% on ECHO  Acute systolic CHF:  - EF initially 25% on LV-gram, improved to 50-55% after intervention.  - BP is slightly better.  - started beta blocker, metoprolol 12.5 BID  HCAP:  - Per PCCM. Finished aztreoman.  - They have signed off.   C diff:  - diarrhea improved.  - Continuing Vanc  PO and Flagyl IV. Day number 9.  - Per Dr. Lamonte Sakai continue IV Flagyl while here in hospital and when [place], continue PO Vanc for a total of 2 weeks. (Does not need IV Flagyl at rehab in other words).  - He stated no need for repeat C diff assay unless diarrhea returns.   Rheumatoid arthritis  - Currently holding methotrexate. Recommend discussing with rheumatologist at home on when to restart.  - Avoid Mobic, NSAID  Disposition  - Comfortable with inpatient rehabilitation when insurance comes through  [place], [place] 12/02/2015, 11:54 AM

## 2015-12-02 NOTE — Care Management Important Message (Signed)
Important Message  Patient Details  Name: PRANEETH HILBURN MRN: [id number] Date of Birth: 1946-10-11   Medicare Important Message Given:  Yes    Dawayne Patricia, RN 12/02/2015, 11:00 AM

## 2015-12-02 NOTE — Care Management Important Message (Signed)
Important Message  Patient Details  Name: [name] MRN: [id number] Date of Birth: 11-Aug-1946   Medicare Important Message Given:  Yes    Nathen May 12/02/2015, 5:02 PM

## 2015-12-02 NOTE — Progress Notes (Signed)
Rehab admissions - I met briefly with patient and his wife.  They would like inpatient rehab admission.  I have called and faxed information to [facility] as they are the primary insurance provider.  Await decision from [facility] regarding potential inpatient rehab admission.  I will follow up once I hear back from insurance case manager.  Call me for questions.  #[phone]

## 2015-12-02 NOTE — Evaluation (Signed)
Occupational Therapy Evaluation Patient Details Name: [name] MRN: [id number] DOB: 04-10-1946 Today's Date: 12/02/2015    History of Present Illness 69 y.o. male w/ PMHx significant for rheumatoid arthritis and HTN who presented to [facility] on 11/11/2015 after an out-of-hospital cardiac arrest. The patient reportedly had chest and arm pain intermittently for 2 weeks after sustaining a mechanical fall. Today he had worsening of his chest pain and he ultimately arrested at home, witnessed by his wife. First responder delivered 2 shocks via an AED and the patient had ROSC with this. An EKG in the field was suggestive of acute anterior STEMI  Patient intubated 11/29-12/8.  Pt reintubated 12/13 for laryngeal edema and PT signed off.  Pt extubated 12/16.  PT reordered 12/16 over weekend.     Clinical Impression   Pt admitted with above. Pt independent with ADLs, PTA. Feel pt will benefit from acute OT to increase strength and independence prior to d/c. Recommending CIR for rehab.    Follow Up Recommendations  CIR    Equipment Recommendations  Other (comment) (defer to next venue)    Recommendations for Other Services Rehab consult     Precautions / Restrictions Precautions Precautions: Fall Restrictions Weight Bearing Restrictions: No      Mobility Bed Mobility               General bed mobility comments: not assessed  Transfers Overall transfer level: Needs assistance Equipment used: Rolling walker (2 wheeled) Transfers: Sit to/from [facility] Sit to Stand: Min assist (Min assist-Min guard) Stand pivot transfers: Min guard       General transfer comment: Took pivotal steps to/from [facility].     Balance      Used RW for pivotal steps in session.                                      ADL Overall ADL's : Needs assistance/impaired                 Upper Body Dressing : Set up;Sitting   Lower Body Dressing:  Min guard;Sit to/from stand   Toilet Transfer: Minimal assistance;Stand-pivot;RW;BSC (Min guard for stand pivot/pivotal steps; Min guard-Min for sit to stand transfer)   Toileting- Water quality scientist and Hygiene: Min guard;Sit to/from stand       Functional mobility during ADLs: Min guard;Rolling walker (stand pivot/pivotal steps)       Vision     Perception     Praxis      Pertinent Vitals/Pain Pain Assessment: 0-10 Pain Score:  (8.5) Pain Location: chest and buttocks Pain Descriptors / Indicators: Sore Pain Intervention(s): Monitored during session     Hand Dominance Right   Extremity/Trunk Assessment Upper Extremity Assessment Upper Extremity Assessment: Generalized weakness   Lower Extremity Assessment Lower Extremity Assessment: Defer to PT evaluation       Communication Communication Communication: No difficulties   Cognition Arousal/Alertness: Awake/alert Behavior During Therapy: WFL for tasks assessed/performed Overall Cognitive Status: No family/caregiver present to determine baseline cognitive functioning Area of Impairment: Orientation;Memory;Safety/judgement;Problem solving Orientation Level: Disoriented to;Time   Memory: Decreased short-term memory   Safety/Judgement: Decreased awareness of safety;Decreased awareness of deficits   Problem Solving: Slow processing     General Comments       Exercises       Shoulder Instructions      Home Living Family/patient expects to be discharged  to:: Unsure Living Arrangements: Spouse/significant other Available Help at Discharge: Friend(s);Available PRN/intermittently (wife works form home but she is upstairs and pt down) Type of Home: House Home Access: Stairs to enter [employer] of Steps: 4 Entrance Stairs-Rails: Right Home Layout: Two level;Able to live on main level with bedroom/bathroom     Bathroom Shower/Tub: Tub/shower unit   Bathroom Toilet: Standard     Home  Equipment: Environmental consultant - 4 wheels   Additional Comments: Wife states in April when pt injured, pt stayed on first level and borrowed a 3N1 and slept in recliner.  He was able to get to his 3N1 without her help.  She works upstairs in her office and cannot provide 24/7 care to him.        Prior Functioning/Environment Level of Independence: Independent        Comments: did part time work for [employer], wife can work from home    OT Diagnosis: Generalized weakness   OT Problem List: Decreased strength;Decreased cognition;Decreased safety awareness;Decreased knowledge of use of DME or AE;Decreased knowledge of precautions;Pain   OT Treatment/Interventions: Self-care/ADL training;DME and/or AE instruction;Therapeutic activities;Patient/family education;Balance training;Cognitive remediation/compensation;Therapeutic exercise    OT Goals(Current goals can be found in the care plan section) Acute Rehab OT Goals Patient Stated Goal: go home OT Goal Formulation: With patient Time For Goal Achievement: 12/09/15 Potential to Achieve Goals: Good ADL Goals Pt Will Perform Lower Body Dressing: sit to/from stand;with min guard assist (including gathering items) Pt Will Transfer to Toilet: with supervision;ambulating;regular height toilet Pt Will Perform Toileting - Clothing Manipulation and hygiene: with set-up;sit to/from stand;with supervision  OT Frequency: Min 2X/week   Barriers to D/C:            Co-evaluation              End of Session Equipment Utilized During Treatment: Gait belt;Rolling walker Nurse Communication: Mobility status  Activity Tolerance: Patient tolerated treatment well Patient left: in chair;with call bell/phone within reach;with chair alarm set   Time: [phone] OT Time Calculation (min): 15 min Charges:  OT General Charges $OT Visit: 1 Procedure OT Evaluation $Initial OT Evaluation Tier I: 1 Procedure G-CodesBenito Mccreedy OTR/L [id number] 12/02/2015,  9:43 AM

## 2015-12-03 ENCOUNTER — Inpatient Hospital Stay (HOSPITAL_COMMUNITY): Payer: BLUE CROSS/BLUE SHIELD

## 2015-12-03 LAB — BASIC METABOLIC PANEL
ANION GAP: 9 (ref 5–15)
BUN: 15 mg/dL (ref 6–20)
CHLORIDE: 115 mmol/L — AB (ref 101–111)
CO2: 20 mmol/L — ABNORMAL LOW (ref 22–32)
Calcium: 8.1 mg/dL — ABNORMAL LOW (ref 8.9–10.3)
Creatinine, Ser: 0.85 mg/dL (ref 0.61–1.24)
GFR calc Af Amer: >60 mL/min (ref >60)
GLUCOSE: 199 mg/dL — AB (ref 65–99)
POTASSIUM: 3.8 mmol/L (ref 3.5–5.1)
Sodium: 144 mmol/L (ref 135–145)

## 2015-12-03 MED ORDER — OXYCODONE-ACETAMINOPHEN 5-325 MG PO TABS
1.0000 | ORAL_TABLET | Freq: Four times a day (QID) | ORAL | Status: DC | PRN
  Administered 2015-12-04 (×2): 1 via ORAL
  Filled 2015-12-03 (×2): qty 1

## 2015-12-03 MED ORDER — SALINE SPRAY 0.65 % NA SOLN
1.0000 | NASAL | Status: DC | PRN
  Administered 2015-12-04: 1 via NASAL
  Filled 2015-12-03 (×2): qty 44

## 2015-12-03 NOTE — Progress Notes (Signed)
PT Cancellation Note  Patient Details Name: [name] MRN: [id number] DOB: 1946/07/09   Cancelled Treatment:    Reason Eval/Treat Not Completed: Patient at procedure or test/unavailable.  Pt in [place] when came by.  Will see 12/22 as able. 12/03/2015  Donnella Sham, [place] [phone]  (pager)   Armin Yerger, Tessie Fass 12/03/2015, 6:06 PM

## 2015-12-03 NOTE — Progress Notes (Signed)
Patient Profile: 69 y.o. male w/ PMHx significant for rheumatoid arthritis and HTN who presented to [facility] on 11/11/2015 after an out-of-hospital cardiac arrest/ CODE STEMI.   Subjective: Notes pleuritic chest discomfort, worse with coughing. No dyspnea. Main complaint is lack of sleep.   Objective: Vital signs in last 24 hours: Temp:  [97.7 F (36.5 C)-98.1 F (36.7 C)] 97.7 F (36.5 C) (12/21 0300) Pulse Rate:  [99-109] 101 (12/21 0300) Resp:  [16-18] 18 (12/21 0300) BP: (101-111)/(62-71) 107/62 mmHg (12/21 0300) SpO2:  [99 %] 99 % (12/21 0300) Weight:  [180 lb 3.2 oz (81.738 kg)] 180 lb 3.2 oz (81.738 kg) (12/21 0300) Last BM Date: 12/03/15  Intake/Output from previous day: 12/20 0701 - 12/21 0700 In: 840 [P.O.:840] Out: 400 [Urine:400] Intake/Output this shift: Total I/O In: 120 [P.O.:120] Out: -   Medications Current Facility-Administered Medications  Medication Dose Route Frequency Provider Last Rate Last Dose  . 0.9 %  sodium chloride infusion   Intravenous Continuous Praveen Mannam, MD 10 mL/hr at 12/02/15 1637    . acetaminophen (TYLENOL) tablet 650 mg  650 mg Oral Q4H PRN Sherren Mocha, MD   650 mg at 12/02/15 1641  . antiseptic oral rinse (CPC / CETYLPYRIDINIUM CHLORIDE 0.05%) solution 7 mL  7 mL Mouth Rinse BID Rush Farmer, MD   7 mL at 12/02/15 2200  . aspirin chewable tablet 81 mg  81 mg Oral Daily Sherren Mocha, MD   81 mg at 12/02/15 1053  . atorvastatin (LIPITOR) tablet 80 mg  80 mg Oral q1800 Leonie Man, MD   80 mg at 12/02/15 1918  . enoxaparin (LOVENOX) injection 40 mg  40 mg Subcutaneous Q24H Darnell Level Mancheril, RPH   40 mg at 12/02/15 1349  . famotidine (PEPCID) 40 MG/5ML suspension 20 mg  20 mg Oral BID Lyndee Leo, RPH   20 mg at 12/02/15 2200  . feeding supplement (ENSURE ENLIVE) (ENSURE ENLIVE) liquid 237 mL  237 mL Oral BID BM Asencion Islam, RD   237 mL at 12/02/15 1349  . fentaNYL (SUBLIMAZE) injection 50-200 mcg   50-200 mcg Intravenous Q2H PRN Brand Males, MD   100 mcg at 12/03/15 0550  . fluticasone (FLONASE) 50 MCG/ACT nasal spray 2 spray  2 spray Each Nare Daily Rhonda G Barrett, PA-C   2 spray at 12/02/15 1059  . Gerhardt's butt cream   Topical PRN Rush Farmer, MD      . guaiFENesin (ROBITUSSIN) 100 MG/5ML solution 300 mg  15 mL Per Tube 6 times per day Erick Colace, NP   300 mg at 12/03/15 0400  . loratadine (CLARITIN) tablet 10 mg  10 mg Oral Daily Jerline Pain, MD   10 mg at 12/02/15 1638  . metoprolol tartrate (LOPRESSOR) tablet 12.5 mg  12.5 mg Oral BID Jerline Pain, MD   12.5 mg at 12/02/15 2223  . metroNIDAZOLE (FLAGYL) IVPB 500 mg  500 mg Intravenous Q8H Rush Farmer, MD   500 mg at 12/03/15 0400  . ondansetron (ZOFRAN) injection 4 mg  4 mg Intravenous Q6H PRN Sherren Mocha, MD   4 mg at 11/30/15 0930  . tamsulosin (FLOMAX) capsule 0.4 mg  0.4 mg Oral Daily Rhonda G Barrett, PA-C   0.4 mg at 12/02/15 1053  . ticagrelor (BRILINTA) tablet 90 mg  90 mg Oral BID Sherren Mocha, MD   90 mg at 12/02/15 2223  . traZODone (DESYREL) tablet 50 mg  50 mg Oral QHS Collene Gobble, MD   50 mg at 12/02/15 2222  . vancomycin (VANCOCIN) 50 mg/mL oral solution 500 mg  500 mg Oral 4 times per day Eileen Stanford, PA-C   500 mg at 12/03/15 0551    PE: General appearance: alert, cooperative and no distress Neck: no carotid bruit and no JVD Lungs: clear to auscultation bilaterally Heart: regular rate and rhythm, S1, S2 normal, no murmur, click, rub or gallop Extremities: no LEE Pulses: 2+ and symmetric Skin: warm and dry Neurologic: Grossly normal  Lab Results:  No results for input(s): WBC, HGB, HCT, PLT in the last 72 hours. BMET  Recent Labs  12/03/15 0510  NA 144  K 3.8  CL 115*  CO2 20*  GLUCOSE 199*  BUN 15  CREATININE 0.85  CALCIUM 8.1*     Assessment/Plan  Principal Problem:   Cardiac arrest with ventricular fibrillation ([name]) Active Problems:   Rheumatoid  arthritis ([name])   ST elevation (STEMI) myocardial infarction involving left anterior descending coronary artery ([name])   Acute respiratory failure with hypoxia ([name])   Shock circulatory ([name])   Coma ([name])   CAD S/P PCI OF pLAD with 3.5 mm x38 mm Promus DES (3.75 mm)   Cardiomyopathy, ischemic - EF 20-25% by LV Gram   Acute delirium   Hypokalemia   Cough   HCAP (healthcare-associated pneumonia)   Diarrhea of presumed infectious origin   Difficult intravenous access   Dysphagia   Tachypnea   Hypernatremia   Leukocytosis   Absolute anemia   Thrombocytosis ([name])   1. Ventricular fibrillation arrest  - Alert, good recovery - Doing well on floor - Overall improved.    2. STEMI  - Total occlusion of proximal LAD - s/p DES - Non obstructive Circ and RCA - EF was 25% but improved to 50% on ECHO - Continue ASA and Brilinta, statin and BB  Acute systolic CHF  - EF initially 25% on LV-gram, improved to 50-55% after intervention.  - BP is slightly better.  - started beta blocker, metoprolol 12.5 BID  HCAP - Per PCCM. Finished aztreoman.  - They have signed off.   C diff - diarrhea improved.  - Continuing Vanc PO and Flagyl IV. Day number 10.  - Per Dr. Lamonte Sakai continue IV Flagyl while here in hospital and when [place], continue PO Vanc for a total of 2 weeks. (Does not need IV Flagyl at rehab in other words).  - He stated no need for repeat C diff assay unless diarrhea returns.   Rheumatoid arthritis - Currently holding methotrexate. Recommend discussing with rheumatologist at home on when to restart. - Avoid Mobic, NSAID  Hypernatremia - level improved and now WNL at 144  Disposition - Comfortable with inpatient rehabilitation when insurance comes through    LOS: 22 days    Brittainy M. Ladoris Gene 12/03/2015 9:28 AM  Personally seen and examined. Agree with above. Inpatient rehab when bed available. Candee Furbish, MD

## 2015-12-03 NOTE — Progress Notes (Signed)
Rehab admissions - I have approval for acute inpatient rehab admission from insurance carrier.  However, I do not have an available bed on rehab for patient right now.  I will update all if I get an available bed later today.  Call me for questions.  RC:[id number]

## 2015-12-03 NOTE — Progress Notes (Signed)
Speech Language Pathology Treatment: Dysphagia  Patient Details Name: [name] MRN: [id number] DOB: 09-04-1946 Today's Date: 12/03/2015 Time: [id number] SLP Time Calculation (min) (ACUTE ONLY): 25 min  Assessment / Plan / Recommendation Clinical Impression  Pt has occasional, dry throat clearing that is noted throughout trials of regular textures and thin liquids. He appears stronger overall and with significantly improved vocal quality since initial MBS 12/16. Recommend repeat MBS to assess for improvements prior to diet advancements given subtle signs of difficulty at bedside and silent penetration noted previously.   HPI HPI: 69 year old male with PMH of RA and HTN. He presented to [facility] ED 11/29 after a witnessed cardiac arrest at home. Family initiated CPR. EMS arrived and AED was placed. Two shocks were delivered until ROSC was achieved. Total down time estimated at less than 15 minutes. Pt was intubated 11/29-12/8, and required reintubation 12/13-12/15.      SLP Plan  MBS     Recommendations  Diet recommendations: Dysphagia 2 (fine chop);Nectar-thick liquid Liquids provided via: Cup;No straw Medication Administration: Crushed with puree Supervision: Patient able to self feed;Full supervision/cueing for compensatory strategies Compensations: Slow rate;Small sips/bites;Multiple dry swallows after each bite/sip;Minimize environmental distractions Postural Changes and/or Swallow Maneuvers: Seated upright 90 degrees       Oral Care Recommendations: Oral care BID Follow up Recommendations: Inpatient Rehab;24 hour supervision/assistance Plan: MBS   [name], M.A. CCC-SLP [phone]  [name] 12/03/2015, 10:50 AM

## 2015-12-03 NOTE — Progress Notes (Signed)
MBSS complete. Full report located under chart review in imaging section.  Waylan Busta Paiewonsky, M.A. CCC-SLP [phone]  

## 2015-12-03 NOTE — Progress Notes (Signed)
[id number] Came to educate pt with wife present. Reviewed importance of brilinta with stent. Has brilinta card. Reviewed heart healthy diet, NTG use, CRP 2. Referral made to [place] knowing this may be a long term goal for pt. Depends on progress. Assisted pt to [facility] and notified staffing. Pt 's wife in room with pt. PT to see pt today. We are following their progress with pt before attempting ambulation. Wife was very receptive to ed. Graylon Good RN BSN 12/03/2015 3:41 PM

## 2015-12-03 NOTE — PMR Pre-admission (Signed)
PMR Admission Coordinator Pre-Admission Assessment  Patient: [name] is an 69 y.o., male MRN: [id number] DOB: [birth year] Height: _0  (170.2 cm) Weight: 81.421 kg (179 lb 8 oz)              Insurance Information HMO:    PPO: Yes     PCP:       IPA:       80/20:       OTHER:  [place] # [id number] PRIMARY: [place]      Policy#: [id number]      Subscriber: Hilma Favors  CM Name: Randolm Idol      Phone#: [phone]     Fax#: [phone] Pre-Cert#: [id number] approved for 8 days with update due 12/09/15 to [phone] telephonic      Employer: Wife works FT Benefits:  Phone #: [phone]     Name: York Pellant. Date: 12/13/13     Deduct:  $1000 (met $1000)      Out of Pocket Max: $3000 (met $3000)      Life Max: None CIR: 90% w/auth      SNF: 90% w/auth with 120 days max Outpatient: 30 visits per year     Co-Pay: $30/visit Home Health: 90% with 60 visit limit      Co-Pay: 10% DME: 90%     Co-Pay: 10% Providers: in network  SECONDARY: Medicare A/B      Policy#: [id number]      Subscriber: Deniece Ree CM Name:        Phone#:       Fax#:   Pre-Cert#:        Employer: Parttime at [employer] Benefits:  Phone #:       Name: Checked in [place]. Date: 01/14/12     Deduct: $1288      Out of Pocket Max: none      Life Max: unlimited CIR: 100%      SNF: 100 days Outpatient: 80%     Co-Pay: 20% Home Health: 100%      Co-Pay: none DME: 80%     Co-Pay: 20%  Emergency Contact Information Contact Information    Name Relation Home Work Mobile   Capetillo,Deborah Spouse   [phone]     Current Medical History  Patient Admitting Diagnosis: Debility after cardiac arrest   History of Present Illness: A 69 y.o. male with history of RA, impaired glucose tolerance, HTN who was admitted to [facility] on 11/11/15 with 2 week history of intermittent CP and witnessed cardiac arrest. Family initiated CPR, he was treated with ACLS protocol and EKG in field showed evidence of NSTEMI. He  was unresponsive and intubated in ED and taken to cath lab emergently. Cardiac cath revealed occlusive LAD lesion which was treated with PCI and DES by Dr. Burt Knack and was treated with hypothermia protocol. Hospital course significant for respiratory failure due to HCAP as well as laryngeal edema post extubation 12/08 requiring reintubation on 12/13 due to increase in WOB and lethargy. Diarrhea due to c diff colitis treated with vanc and flagyl with improvement in leucocytosis. He tolerated extubation on 12/15 and was started on dysphagia 2, nectar liquids. Repeat swallow study done today and patient advanced to dysphagia 3, thin liquids. Patient with resultant weakness with unsteady gait and inability to complete ADL tasks. CIR recommended for follow up therapy.     Past Medical History  Past Medical History  Diagnosis Date  . Rheumatoid arthritis(714.0) 1998  . Nephrolithiasis 1968  . Insomnia   .  Allergic rhinitis   . Impaired glucose tolerance   . Hypertension   . ST elevation (STEMI) myocardial infarction involving left anterior descending coronary artery ([place]) 11/11/2015    100% LAD, Cardiogenic Shock. -- EF ~20-25%  . Cardiac arrest with ventricular fibrillation ([place]) 11/11/2015  . CAD S/P PCI OF pLAD with 3.5 mm x38 mm Promus DES (3.75 mm) 11/12/2015  . Cardiomyopathy, ischemic - EF 20-25% by LV Gram 11/12/2015    There is severe left ventricular systolic dysfunction. The left ventricular ejection fraction is 25-35% by visual estimate. There are wall motion abnormalities in the left ventricle. There are segmental wall motion abnormalities in the left ventricle. The anterolateral and periapical LV walls are akinetic with an estimated LVEF of 25%   . Pelvic fracture ([place]) 09/2015  . Fractured rib     Family History  family history includes Cancer in his mother; Diabetes in his father.  Prior Rehab/Hospitalizations: No previous rehab admissions.  Has the patient had major surgery  during 100 days prior to admission? No  Current Medications   Current facility-administered medications:  .  0.9 %  sodium chloride infusion, , Intravenous, Continuous, Praveen Mannam, MD, Last Rate: 10 mL/hr at 12/02/15 1637 .  acetaminophen (TYLENOL) tablet 650 mg, 650 mg, Oral, Q4H PRN, Sherren Mocha, MD, 650 mg at 12/03/15 1404 .  aspirin chewable tablet 81 mg, 81 mg, Oral, Daily, Sherren Mocha, MD, 81 mg at 12/03/15 0953 .  atorvastatin (LIPITOR) tablet 80 mg, 80 mg, Oral, q1800, Leonie Man, MD, 80 mg at 12/03/15 1746 .  enoxaparin (LOVENOX) injection 40 mg, 40 mg, Subcutaneous, Q24H, Darnell Level Mancheril, RPH, 40 mg at 12/03/15 1355 .  famotidine (PEPCID) 40 MG/5ML suspension 20 mg, 20 mg, Oral, BID, Lyndee Leo, RPH, 20 mg at 12/03/15 2128 .  feeding supplement (ENSURE ENLIVE) (ENSURE ENLIVE) liquid 237 mL, 237 mL, Oral, BID BM, Asencion Islam, RD, Stopped at 12/03/15 1030 .  fluticasone (FLONASE) 50 MCG/ACT nasal spray 2 spray, 2 spray, Each Nare, Daily, Rhonda G Barrett, PA-C, 2 spray at 12/03/15 0954 .  Gerhardt's butt cream, , Topical, PRN, Rush Farmer, MD .  guaiFENesin (ROBITUSSIN) 100 MG/5ML solution 300 mg, 15 mL, Per Tube, 6 times per day, Erick Colace, NP, 300 mg at 12/04/15 0824 .  loratadine (CLARITIN) tablet 10 mg, 10 mg, Oral, Daily, Jerline Pain, MD, 10 mg at 12/03/15 1478 .  metoprolol tartrate (LOPRESSOR) tablet 12.5 mg, 12.5 mg, Oral, BID, Jerline Pain, MD, 12.5 mg at 12/03/15 2101 .  metroNIDAZOLE (FLAGYL) IVPB 500 mg, 500 mg, Intravenous, Q8H, Rush Farmer, MD, 500 mg at 12/04/15 0503 .  ondansetron (ZOFRAN) injection 4 mg, 4 mg, Intravenous, Q6H PRN, Sherren Mocha, MD, 4 mg at 11/30/15 0930 .  oxyCODONE-acetaminophen (PERCOCET/ROXICET) 5-325 MG per tablet 1 tablet, 1 tablet, Oral, Q6H PRN, Consuelo Pandy, PA-C, 1 tablet at 12/04/15 [phone] .  sodium chloride (OCEAN) 0.65 % nasal spray 1 spray, 1 spray, Each Nare, PRN, Consuelo Pandy, PA-C,  1 spray at 12/04/15 0138 .  tamsulosin (FLOMAX) capsule 0.4 mg, 0.4 mg, Oral, Daily, Rhonda G Barrett, PA-C, 0.4 mg at 12/03/15 0953 .  ticagrelor (BRILINTA) tablet 90 mg, 90 mg, Oral, BID, Sherren Mocha, MD, 90 mg at 12/03/15 2101 .  traZODone (DESYREL) tablet 50 mg, 50 mg, Oral, QHS, Collene Gobble, MD, 50 mg at 12/03/15 2101 .  vancomycin (VANCOCIN) 50 mg/mL oral solution 500 mg, 500 mg, Oral, 4  times per day, Eileen Stanford, PA-C, 500 mg at 12/04/15 7681  Patients Current Diet: DIET DYS 3 Room service appropriate?: Yes; Fluid consistency:: Thin  Precautions / Restrictions Precautions Precautions: Fall Restrictions Weight Bearing Restrictions: No   Has the patient had 2 or more falls or a fall with injury in the past year?No.  However, he fell off ladder 04/08/15 with multiple injuries.  Prior Activity Level Community (5-7x/wk): Went out daily.  Worked PT 4 days a week 4 hours a day at [employer] in [employer].  He mowed his yard and was active outside.  Home Assistive Devices / Equipment Home Assistive Devices/Equipment: None Home Equipment: Walker - 4 wheels  Prior Device Use: Indicate devices/aids used by the patient prior to current illness, exacerbation or injury? None.  However, in May he used a walker and then progressed to cane, then off cane after June 2016.  Prior Functional Level Prior Function Level of Independence: Independent Comments: did part time work for [employer], wife can work from home  [place]: Did the patient need help bathing, dressing, using the toilet or eating?  Independent  Indoor Mobility: Did the patient need assistance with walking from room to room (with or without device)? Independent  Stairs: Did the patient need assistance with internal or external stairs (with or without device)? Independent  Functional Cognition: Did the patient need help planning regular tasks such as shopping or remembering to take medications?  Independent  Current Functional Level Cognition  Overall Cognitive Status: No family/caregiver present to determine baseline cognitive functioning Orientation Level: Oriented X4 Following Commands: Follows one step commands inconsistently, Follows one step commands with increased time Safety/Judgement: Decreased awareness of safety, Decreased awareness of deficits General Comments: very slow to respond to mobility commands as if planning issue, but pt also globally weak; stool incontinence continues with poor awareness.  Poor awareness of deficits by pt.     Extremity Assessment (includes Sensation/Coordination)  Upper Extremity Assessment: Generalized weakness RUE Deficits / Details: AROM grossly WFL, strength shoulder flexion 3+/5, elbow flexion 4-/5 grip 4-/5 LUE Deficits / Details: AROM grossly WFL, strength shoulder flexion 3-/5, elbow flexion 4-/5 grip 4-/5  Lower Extremity Assessment: Defer to PT evaluation RLE Deficits / Details: grossly 3/5 LLE Deficits / Details: grossly 3/5    ADLs  Overall ADL's : Needs assistance/impaired Upper Body Dressing : Set up, Sitting Lower Body Dressing: Min guard, Sit to/from stand Toilet Transfer: Minimal assistance, Stand-pivot, RW, BSC (Min guard for stand pivot/pivotal steps; Min guard-Min assist) Toileting- Water quality scientist and Hygiene: Min guard, Sit to/from stand Functional mobility during ADLs: Min guard, Rolling walker (stand pivot/pivotal steps)    Mobility  Overal bed mobility: Needs Assistance Bed Mobility: Rolling, Sit to Supine Rolling: Min assist Sidelying to sit: Max assist Sit to supine: Min assist, +2 for physical assistance General bed mobility comments: not assessed    Transfers  Overall transfer level: Needs assistance Equipment used: Rolling walker (2 wheeled) Transfers: Sit to/from Stand, [employer] Transfers Sit to Stand: Min assist (Min assist-Min guard) Stand pivot transfers: Min guard Squat pivot  transfers: Max assist, Total assist, +2 physical assistance General transfer comment: Took pivotal steps to/from [facility].     Ambulation / Gait / Stairs / Wheelchair Mobility  Ambulation/Gait Ambulation/Gait assistance: Mod assist, +2 physical assistance Ambulation Distance (Feet): 12 Feet Assistive device: Rolling walker (2 wheeled) Gait Pattern/deviations: Step-through pattern, Decreased stride length, Trunk flexed, Staggering left, Staggering right, Leaning posteriorly General Gait Details: Pt began  ambulation and needed cues and assist to sequence steps and to steer rW.  At one point, pts right hand slipped off RW and pt needed mod assist to recover balance.  Pt generally unsteady with several LOB during ambulation in which pt needed assist to recover.  Pt lacks safety awareness to self correct.     Gait velocity interpretation: Below normal speed for age/gender    Posture / Balance Dynamic Sitting Balance Sitting balance - Comments: worked on balance at EOB x about 10 minutes cues, placement, trunk rotation and facilitation for head, neck, shoulder and core activation.  Has strength, but cannot maintain more than a few seconds with head righted, able to rotate trunk and demonstrated improved balance momentarily afterwards. Balance Overall balance assessment: Needs assistance, History of Falls Sitting-balance support: No upper extremity supported, Feet supported Sitting balance-Leahy Scale: Fair Sitting balance - Comments: worked on balance at EOB x about 10 minutes cues, placement, trunk rotation and facilitation for head, neck, shoulder and core activation.  Has strength, but cannot maintain more than a few seconds with head righted, able to rotate trunk and demonstrated improved balance momentarily afterwards. Standing balance support: Bilateral upper extremity supported, During functional activity Standing balance-Leahy Scale: Poor Standing balance comment: Relies on UE support and cuing as  well as assist to maintain static standing.   High level balance activites: Direction changes, Turns High Level Balance Comments: mod assist with above challenges to balance.      Special needs/care consideration BiPAP/CPAP No CPM No Continuous Drip IV KVO Dialysis No        Life Vest No Oxygen No Special Bed No Trach Size No Wound Vac (area) No    Skin Has reddened buttocks area, back has hives per his wife                              Bowel mgmt: Last BM 12/03/15 - diarrhea, C-Diff precautions Bladder mgmt: Voiding in urinal Diabetic mgmt No Enteric Precautions - C-Diff, on antibiotics    Previous Home Environment Living Arrangements: Spouse/significant other Available Help at Discharge: Friend(s), Available PRN/intermittently (wife works form home but she is upstairs and pt down) Type of Home: House Home Layout: Two level, Able to live on main level with bedroom/bathroom Home Access: Stairs to enter Entrance Stairs-Rails: Right Entrance Stairs-Number of Steps: 4 Bathroom Shower/Tub: Chiropodist: Standard Home Care Services: No Additional Comments: Wife states in April when pt injured, pt stayed on first level and borrowed a 3N1 and slept in recliner.  He was able to get to his 3N1 without her help.  She works upstairs in her office and cannot provide 24/7 care to him.    Discharge Living Setting Plans for Discharge Living Setting: Patient's home, House, Lives with (comment) Type of Home at Discharge: House Discharge Home Layout: Two level, Full bath on main level, Bed/bath upstairs (Could stay in recliner on main level.) Alternate Level Stairs-Number of Steps: 15 steps Discharge Home Access: Stairs to enter Entrance Stairs-Number of Steps: 5 Does the patient have any problems obtaining your medications?: No  Social/Family/Support Systems Patient Roles: Spouse, Parent (Has a wife and a daughter.) Contact Information: Namish Krise -  wife Anticipated Caregiver: wife Anticipated Caregiver's Contact Information: Jackelyn Poling - wife [phone] Ability/Limitations of Caregiver: Wife works days 7:30 am to 6:30 pm Caregiver Availability: Intermittent Discharge Plan Discussed with Primary Caregiver: Yes Is Caregiver In Agreement with Plan?: Yes  Does Caregiver/Family have Issues with Lodging/Transportation while Pt is in Rehab?: No  Goals/Additional Needs Patient/Family Goal for Rehab: PT/OT mod I and supervision, ST mod I and independent goals Expected length of stay: 13-16 days Cultural Considerations: None Dietary Needs: Dys 3, thin liquids Equipment Needs: TBD Pt/Family Agrees to Admission and willing to participate: Yes Program Orientation Provided & Reviewed with Pt/Caregiver Including Roles  & Responsibilities: Yes  Decrease burden of Care through IP rehab admission: N/A  Possible need for SNF placement upon discharge: Not planned  Patient Condition: This patient's medical and functional status has changed since the consult dated: 12/01/15 in which the Rehabilitation Physician determined and documented that the patient's condition is appropriate for intensive rehabilitative care in an inpatient rehabilitation facility. See "History of Present Illness" (above) for medical update. Functional changes are: Currently requiring mod assist to ambulate 12' RW. Patient's medical and functional status update has been discussed with the Rehabilitation physician and patient remains appropriate for inpatient rehabilitation. Will admit to inpatient rehab today.  Preadmission Screen Completed By:  Retta Diones, 12/04/2015 9:06 AM ______________________________________________________________________   Discussed status with Dr. Naaman Plummer on 12/04/15 at 0907 and received telephone approval for admission today.  Admission Coordinator:  Retta Diones, time0907/Date12/22/16

## 2015-12-03 NOTE — NC FL2 (Signed)
[place] LEVEL OF CARE SCREENING TOOL     IDENTIFICATION  Patient Name: [name] Birthdate: 1946/01/15 Sex: male Admission Date (Current Location): 11/11/2015  [facility] and [place] Number:  Herbalist and Address:  [facility], [place] [address], [place], [place] [address]      Provider Number: [id number]  Attending Physician Name and Address:  Sherren Mocha, MD  Relative Name and Phone Number:       Current Level of Care: Hospital Recommended Level of Care: [place] Prior Approval Number:    Date Approved/Denied:   PASRR Number: [id number]  Discharge Plan: SNF    Current Diagnoses: Patient Active Problem List   Diagnosis Date Noted  . Dysphagia   . Tachypnea   . Hypernatremia   . Leukocytosis   . Absolute anemia   . Thrombocytosis ([place])   . Difficult intravenous access   . Diarrhea of presumed infectious origin   . HCAP (healthcare-associated pneumonia)   . Hypokalemia 11/21/2015  . Cough 11/21/2015  . Acute delirium   . Acute respiratory failure with hypoxia ([place]) 11/12/2015  . Cardiac arrest ([place]) 11/12/2015  . Shock circulatory ([place]) 11/12/2015  . Coma ([place]) 11/12/2015  . CAD S/P PCI OF pLAD with 3.5 mm x38 mm Promus DES (3.75 mm) 11/12/2015  . Cardiomyopathy, ischemic - EF 20-25% by LV Gram 11/12/2015  . ST elevation (STEMI) myocardial infarction involving left anterior descending coronary artery ([place]) 11/11/2015  . Cardiac arrest with ventricular fibrillation ([place]) 11/11/2015  . Essential hypertension 03/24/2010  . ONYCHOMYCOSIS, TOENAILS 09/23/2009  . TOBACCO USE, QUIT 09/23/2009  . IMPAIRED GLUCOSE TOLERANCE 12/18/2008  . Allergic rhinitis 12/18/2008  . Rheumatoid arthritis ([place]) 12/18/2008  . INSOMNIA 12/18/2008  . NEPHROLITHIASIS, HX OF 12/18/2008    Orientation RESPIRATION BLADDER Height & Weight    Self, Time, Situation, Place  Normal Continent 5\' 7"  (170.2 cm) 180  lbs.  BEHAVIORAL SYMPTOMS/MOOD NEUROLOGICAL BOWEL NUTRITION STATUS      Incontinent Diet (see DC summary)  AMBULATORY STATUS COMMUNICATION OF NEEDS Skin   Extensive Assist Verbally Normal                       Personal Care Assistance Level of Assistance  Bathing, Dressing Bathing Assistance: Maximum assistance   Dressing Assistance: Maximum assistance     Functional Limitations Info             SPECIAL CARE FACTORS FREQUENCY  PT (By licensed PT), OT (By licensed OT)     PT Frequency: 5/wk OT Frequency: 5/wk            Contractures      Additional Factors Info  Code Status, Allergies, Isolation Precautions Code Status Info: FULL Allergies Info: Contrast Media, Ibuprofen, Penicillins, Sulfamethoxazole     Isolation Precautions Info: Enteric Precautions- positive for CDIFF has had 9 days IV Flagyl and will transition to PO Vanc once DC'd from hospital     Current Medications (12/03/2015):  This is the current hospital active medication list Current Facility-Administered Medications  Medication Dose Route Frequency Provider Last Rate Last Dose  . 0.9 %  sodium chloride infusion   Intravenous Continuous Praveen Mannam, MD 10 mL/hr at 12/02/15 1637    . acetaminophen (TYLENOL) tablet 650 mg  650 mg Oral Q4H PRN Sherren Mocha, MD   650 mg at 12/02/15 1641  . antiseptic oral rinse (CPC / CETYLPYRIDINIUM CHLORIDE 0.05%) solution 7 mL  7 mL Mouth Rinse BID Rush Farmer, MD   7 mL at 12/02/15 2200  . aspirin chewable tablet 81 mg  81 mg Oral Daily Sherren Mocha, MD   81 mg at 12/02/15 1053  . atorvastatin (LIPITOR) tablet 80 mg  80 mg Oral q1800 Leonie Man, MD   80 mg at 12/02/15 1918  . enoxaparin (LOVENOX) injection 40 mg  40 mg Subcutaneous Q24H Darnell Level Mancheril, RPH   40 mg at 12/02/15 1349  . famotidine (PEPCID) 40 MG/5ML suspension 20 mg  20 mg Oral BID Lyndee Leo, RPH   20 mg at 12/02/15 2200  . feeding supplement (ENSURE ENLIVE) (ENSURE ENLIVE)  liquid 237 mL  237 mL Oral BID BM Asencion Islam, RD   237 mL at 12/02/15 1349  . fentaNYL (SUBLIMAZE) injection 50-200 mcg  50-200 mcg Intravenous Q2H PRN Brand Males, MD   100 mcg at 12/03/15 0550  . fluticasone (FLONASE) 50 MCG/ACT nasal spray 2 spray  2 spray Each Nare Daily Rhonda G Barrett, PA-C   2 spray at 12/02/15 1059  . Gerhardt's butt cream   Topical PRN Rush Farmer, MD      . guaiFENesin (ROBITUSSIN) 100 MG/5ML solution 300 mg  15 mL Per Tube 6 times per day Erick Colace, NP   300 mg at 12/03/15 0400  . loratadine (CLARITIN) tablet 10 mg  10 mg Oral Daily Jerline Pain, MD   10 mg at 12/02/15 1638  . metoprolol tartrate (LOPRESSOR) tablet 12.5 mg  12.5 mg Oral BID Jerline Pain, MD   12.5 mg at 12/02/15 2223  . metroNIDAZOLE (FLAGYL) IVPB 500 mg  500 mg Intravenous Q8H Rush Farmer, MD   500 mg at 12/03/15 0400  . ondansetron (ZOFRAN) injection 4 mg  4 mg Intravenous Q6H PRN Sherren Mocha, MD   4 mg at 11/30/15 0930  . tamsulosin (FLOMAX) capsule 0.4 mg  0.4 mg Oral Daily Rhonda G Barrett, PA-C   0.4 mg at 12/02/15 1053  . ticagrelor (BRILINTA) tablet 90 mg  90 mg Oral BID Sherren Mocha, MD   90 mg at 12/02/15 2223  . traZODone (DESYREL) tablet 50 mg  50 mg Oral QHS Collene Gobble, MD   50 mg at 12/02/15 2222  . vancomycin (VANCOCIN) 50 mg/mL oral solution 500 mg  500 mg Oral 4 times per day Eileen Stanford, PA-C   500 mg at 12/03/15 [id number]     Discharge Medications: Please see discharge summary for a list of discharge medications.  Relevant Imaging Results:  Relevant Lab Results:   Additional Information SS#: [id number]  [place] Mon, [place]

## 2015-12-03 NOTE — H&P (Signed)
Physical Medicine and Rehabilitation Admission H&P    Chief Complaint  Patient presents with  . Debility after cardiac arrest    HPI:   [name] is a 69 y.o. male with history of RA, impaired glucose tolerance, HTN who was admitted to [facility] on 11/11/15 with 2 week history of intermittent CP and witnessed cardiac arrest. Family initiated CPR, he was treated with ACLS protocol and EKG in field showed evidence of NSTEMI. He was unresponsive and intubated in ED and taken to cath lab emergently. Cardiac cath revealed occlusive LAD lesion which was treated with PCI and DES by Dr. Burt Knack and was treated with hypothermia protocol. Hospital course significant for respiratory failure due to HCAP as well as laryngeal edema post extubation 12/08 requiring reintubation on 12/13 due to increase in WOB and lethargy. Diarrhea due to c diff colitis treated with vanc and flagyl with improvement in leucocytosis. He tolerated extubation on 12/15 and was started on dysphagia 2, nectar liquids. Repeat swallow study done today and patient advanced to dysphagia 3, thin liquids. Patient with resultant weakness with unsteady gait and inability to complete ADL tasks. CIR recommended for follow up therapy.    Review of Systems  Constitutional: Negative for fever, chills and malaise/fatigue.  HENT: Positive for sore throat. Negative for hearing loss.   Eyes: Negative for blurred vision and double vision.  Respiratory: Positive for cough. Negative for hemoptysis.   Cardiovascular: Positive for chest pain (chest wall pain). Negative for palpitations and orthopnea.  Gastrointestinal: Positive for diarrhea. Negative for heartburn, nausea and vomiting.       Rectal pain due to diarrhea--improving  Genitourinary: Negative for dysuria and urgency.  Musculoskeletal: Positive for myalgias, back pain and joint pain.  Skin: Negative for rash.  Neurological: Negative for dizziness, tingling, tremors, sensory change  and headaches.  Psychiatric/Behavioral: Positive for memory loss. Negative for depression and suicidal ideas. The patient is nervous/anxious. The patient does not have insomnia.     Past Medical History  Diagnosis Date  . Rheumatoid arthritis(714.0) 1998  . Nephrolithiasis 1968  . Insomnia   . Allergic rhinitis   . Impaired glucose tolerance   . Hypertension   . ST elevation (STEMI) myocardial infarction involving left anterior descending coronary artery ([place]) 11/11/2015    100% LAD, Cardiogenic Shock. -- EF ~20-25%  . Cardiac arrest with ventricular fibrillation ([place]) 11/11/2015  . CAD S/P PCI OF pLAD with 3.5 mm x38 mm Promus DES (3.75 mm) 11/12/2015  . Cardiomyopathy, ischemic - EF 20-25% by LV Gram 11/12/2015    There is severe left ventricular systolic dysfunction. The left ventricular ejection fraction is 25-35% by visual estimate. There are wall motion abnormalities in the left ventricle. There are segmental wall motion abnormalities in the left ventricle. The anterolateral and periapical LV walls are akinetic with an estimated LVEF of 25%   . Pelvic fracture ([place]) 09/2015  . Fractured rib     Past Surgical History  Procedure Laterality Date  . Status post multiple lithotripy and urological surgery for reccurent calcium oxalate stones    . Cardiac catheterization N/A 11/11/2015    Procedure: Left Heart Cath and Cors/Grafts Angiography;  Surgeon: Sherren Mocha, MD;  Location: [facility];  Service: Cardiovascular;  Laterality: N/A;  . Cardiac catheterization N/A 11/11/2015    Procedure: Coronary Stent Intervention;  Surgeon: Sherren Mocha, MD;  Location: [facility];  Service: Cardiovascular;  Laterality: N/A;  prox lad 3.5x38 promus  Family History  Problem Relation Age of Onset  . Cancer Mother     pancreatic ; pulmonary embolism   . Diabetes Father     Social History: Married. 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. Wife works out of home and  can provide intermittent care after discharge. He reports that he has quit smoking 30+ years ago. He has never used smokeless tobacco. He reports that he drinks alcohol couple times a year. He does not use illicit drugs.    Allergies  Allergen Reactions  . Contrast Media [Iodinated Diagnostic Agents] Hives    03/28/14 pt received 1 hour emergent prep and pt did good and had no complaints after scan.  . Ibuprofen     REACTION: unspecified  . Penicillins     REACTION: unspecified  . Sulfamethoxazole     REACTION: unspecified    Medications Prior to Admission  Medication Sig Dispense Refill  . Cetirizine HCl 10 MG CAPS Take 10 mg by mouth daily.    . Cholecalciferol (VITAMIN D-3) 1000 UNITS CAPS Take 2,000 Units by mouth daily.     . Coenzyme Q10 (CO Q 10) 10 MG CAPS Take 10 mg by mouth daily.    . fexofenadine (ALLEGRA) 30 MG tablet Take 30 mg by mouth daily.    . fluticasone (FLONASE) 50 MCG/ACT nasal spray USE 2 SPRAYS INTO NOSE DAILY 48 g 3  . folic acid (FOLVITE) 1 MG tablet Take 1 mg by mouth daily.      . Golimumab ([place] ARIA IV) Inject into the vein. Infusion every 8 weeks    . hydrochlorothiazide (HYDRODIURIL) 25 MG tablet Take 1 tablet daily (Patient taking differently: Take 25 mg by mouth daily) 90 tablet 1  . HYDROcodone-acetaminophen (NORCO/VICODIN) 5-325 MG per tablet Take 1-2 tablets by mouth every 6 (six) hours as needed for moderate pain. 20 tablet 0  . meloxicam (MOBIC) 7.5 MG tablet Take 1 tablet (7.5 mg total) by mouth 2 (two) times daily. 90 tablet 3  . methotrexate (RHEUMATREX) 2.5 MG tablet Take 20 mg by mouth once a week. 8 tablets every week on Sunday Morning Caution:Chemotherapy. Protect from light.    . Probiotic Product (PRO-BIOTIC BLEND PO) Take 1 tablet by mouth daily.    . saw palmetto 160 MG capsule Take 320 mg by mouth daily.     . tamsulosin (FLOMAX) 0.4 MG CAPS capsule Take 1 capsule (0.4 mg total) by mouth daily. 90 capsule 0  . traMADol (ULTRAM)  50 MG tablet Take 50 mg by mouth every 6 (six) hours as needed for moderate pain.    . traZODone (DESYREL) 100 MG tablet Take 1 tablet (100 mg total) by mouth at bedtime. 90 tablet 1    Home: Home Living Family/patient expects to be discharged to:: Unsure Living Arrangements: Spouse/significant other Available Help at Discharge: Friend(s), Available PRN/intermittently (wife works form home but she is upstairs and pt down) Type of Home: House Home Access: Stairs to enter Technical brewer of Steps: 4 Entrance Stairs-Rails: Right Home Layout: Two level, Able to live on main level with bedroom/bathroom Bathroom Shower/Tub: Chiropodist: Standard Home Equipment: Environmental consultant - 4 wheels Additional Comments: Wife states in April when pt injured, pt stayed on first level and borrowed a 3N1 and slept in recliner.  He was able to get to his 3N1 without her help.  She works upstairs in her office and cannot provide 24/7 care to him.     Functional History: Prior Function  Level of Independence: Independent Comments: did part time work for [employer], wife can work from home  Functional Status:  Mobility: Bed Mobility Overal bed mobility: Needs Assistance Bed Mobility: Rolling, Sit to Supine Rolling: Min assist Sidelying to sit: Max assist Sit to supine: Min assist, +2 for physical assistance General bed mobility comments: not assessed Transfers Overall transfer level: Needs assistance Equipment used: Rolling walker (2 wheeled) Transfers: Sit to/from Stand, [employer] Transfers Sit to Stand: Min assist (Min assist-Min guard) Stand pivot transfers: Min guard Squat pivot transfers: Max assist, Total assist, +2 physical assistance General transfer comment: Took pivotal steps to/from [facility].  Ambulation/Gait Ambulation/Gait assistance: Mod assist, +2 physical assistance Ambulation Distance (Feet): 12 Feet Assistive device: Rolling walker (2 wheeled) Gait Pattern/deviations:  Step-through pattern, Decreased stride length, Trunk flexed, Staggering left, Staggering right, Leaning posteriorly General Gait Details: Pt began ambulation and needed cues and assist to sequence steps and to steer rW.  At one point, pts right hand slipped off RW and pt needed mod assist to recover balance.  Pt generally unsteady with several LOB during ambulation in which pt needed assist to recover.  Pt lacks safety awareness to self correct.     Gait velocity interpretation: Below normal speed for age/gender    ADL: ADL Overall ADL's : Needs assistance/impaired Upper Body Dressing : Set up, Sitting Lower Body Dressing: Min guard, Sit to/from stand Toilet Transfer: Minimal assistance, Stand-pivot, RW, BSC (Min guard for stand pivot/pivotal steps; Min guard-Min ) Toileting- Water quality scientist and Hygiene: Min guard, Sit to/from stand Functional mobility during ADLs: Min guard, Rolling walker (stand pivot/pivotal steps)  Cognition: Cognition Overall Cognitive Status: No family/caregiver present to determine baseline cognitive functioning Orientation Level: Oriented X4 Cognition Arousal/Alertness: Awake/alert Behavior During Therapy: WFL for tasks assessed/performed Overall Cognitive Status: No family/caregiver present to determine baseline cognitive functioning Area of Impairment: Orientation, Memory, Safety/judgement, Problem solving Orientation Level: Disoriented to, Time Memory: Decreased short-term memory Following Commands: Follows one step commands inconsistently, Follows one step commands with increased time Safety/Judgement: Decreased awareness of safety, Decreased awareness of deficits Problem Solving: Slow processing General Comments: very slow to respond to mobility commands as if planning issue, but pt also globally weak; stool incontinence continues with poor awareness.  Poor awareness of deficits by pt.     Blood pressure 112/56, pulse 95, temperature 98.4 F (36.9  C), temperature source Oral, resp. rate 18, height _0  (1.702 m), weight 81.421 kg (179 lb 8 oz), SpO2 97 %. Physical Exam  Nursing note and vitals reviewed. Constitutional: He is oriented to person, place, and time. He appears well-developed and well-nourished.  HENT:  Head: Normocephalic and atraumatic.  Right Ear: External ear normal.  Left Ear: External ear normal.  Mouth/Throat: Oropharynx is clear and moist. No oropharyngeal exudate.  Voice slightly hoarse  Eyes: Conjunctivae and EOM are normal. Pupils are equal, round, and reactive to light. Right eye exhibits no discharge. Left eye exhibits no discharge.  Neck: Normal range of motion. Neck supple. No tracheal deviation present. No thyromegaly present.  Cardiovascular: Normal rate and regular rhythm.  Exam reveals no friction rub.   No murmur heard. Respiratory: Effort normal. No respiratory distress. He has wheezes. He has no rales.  Continues to have frequent irritative coughing spells.   GI: Soft. Bowel sounds are normal. He exhibits no distension. There is no tenderness. There is no rebound.  Genitourinary: Penis normal.  Musculoskeletal: He exhibits no edema or tenderness.  Chest wall discomfort with palpation, trunk  movements, deep breathing.   Neurological: He is alert and oriented to person, place, and time. No cranial nerve deficit.  Voice is stronger but still dysphonic at times. Able to follow basic commands without difficulty UE: motor 5/5 delt,bicep,wrist ext, tricep, HI. LE:   3 to 3+ HF, 3+ to 4 KE and 4/5 ADF/APF. No sensory changes. Does not recall any of events of incident. ?mild STM deficits. Otherwise has good insight and awareness. Attention reasonable  Skin: Skin is warm and dry.  Psychiatric: He has a normal mood and affect. His behavior is normal.    Results for orders placed or performed during the hospital encounter of 11/11/15 (from the past 48 hour(s))  Basic metabolic panel     Status: Abnormal    Collection Time: 12/03/15  5:10 AM  Result Value Ref Range   Sodium 144 135 - 145 mmol/L   Potassium 3.8 3.5 - 5.1 mmol/L   Chloride 115 (H) 101 - 111 mmol/L   CO2 20 (L) 22 - 32 mmol/L   Glucose, Bld 199 (H) 65 - 99 mg/dL   BUN 15 6 - 20 mg/dL   Creatinine, Ser 0.85 0.61 - 1.24 mg/dL   Calcium 8.1 (L) 8.9 - 10.3 mg/dL   GFR calc non Af Amer >60 >60 mL/min   GFR calc Af Amer >60 >60 mL/min    Comment: (NOTE) The eGFR has been calculated using the CKD EPI equation. This calculation has not been validated in all clinical situations. eGFR's persistently <60 mL/min signify possible Chronic Kidney Disease.    Anion gap 9 5 - 15     Medical Problem List and Plan: 1.  Debility, dysphagia, ?mild cognitive deficits secondary to secondary to cardiac arrest, anoxia 2.  DVT Prophylaxis/Anticoagulation: Pharmaceutical: Lovenox 3. Pain Management: tylenol prn for chest wall pain.  4. Mood: LCSW to follow for evaluation and support.  5. Neuropsych: This patient is capable of making decisions on her own behalf. 6. Skin/Wound Care: Routine pressure relief measures. Maintain adequate nutrition and hydration status.  7. Fluids/Electrolytes/Nutrition: Monitor I/O. Check lytes in am. Monitor potassium and sodium levels with diarrhea ongoing.  8. RA: Methotrexate on hold--to follow up with Rhemumatology regarding resumption. No NSAIDS/Mobic per cards.  9. HTN: Monitor with BID checks.  10. CAD with V fib arrest/STEMI: On ASA and Brillinta due to LAD DES as well as BB and statin 11. C diff colitis:  On po flagyl and vancomycin D # 11/14. Educated (and allayed fear) patient on need for ongoing precautions.  12. HCAP: Has completed aztreoman for treatment. Continues to have ongoing cough despite scheduled robitussin. Will change to Tussionex for 48 hours to help manage symptoms.  13. Acute systolic CHF:  On metoprolol bid. Check daily weights. Low salt diet.  14. Chest wall pain:  Educate on splinting  when coughing to help with pain management.    Post Admission Physician Evaluation: 1. Functional deficits secondary  to debility, cognitive/swallowing dysfunction after cardiac arrest and multiple medical. 2. Patient is admitted to receive collaborative, interdisciplinary care between the physiatrist, rehab nursing staff, and therapy team. 3. Patient's level of medical complexity and substantial therapy needs in context of that medical necessity cannot be provided at a lesser intensity of care such as a SNF. 4. Patient has experienced substantial functional loss from his/her baseline which was documented above under the "Functional History" and "Functional Status" headings.  Judging by the patient's diagnosis, physical exam, and functional history, the patient has potential for  functional progress which will result in measurable gains while on inpatient rehab.  These gains will be of substantial and practical use upon discharge  in facilitating mobility and self-care at the household level. 5. Physiatrist will provide 24 hour management of medical needs as well as oversight of the therapy plan/treatment and provide guidance as appropriate regarding the interaction of the two. 6. 24 hour rehab nursing will assist with bladder management, bowel management, safety, skin/wound care, disease management, medication administration, pain management and patient education  and help integrate therapy concepts, techniques,education, etc. 7. PT will assess and treat for/with: Lower extremity strength, range of motion, stamina, balance, functional mobility, safety, adaptive techniques and equipment, NMR, activity tolerance, family ed.   Goals are: mod I. 8. OT will assess and treat for/with: ADL's, functional mobility, safety, upper extremity strength, adaptive techniques and equipment, NMR, community reintegration, ego support, family and patient ed.   Goals are: mod I. Therapy may proceed with showering this  patient. 9. SLP will assess and treat for/with: swallowing, speech, higher level cognitive assessment.  Goals are: mod I. 10. Case Management and Social Worker will assess and treat for psychological issues and discharge planning. 11. Team conference will be held weekly to assess progress toward goals and to determine barriers to discharge. 12. Patient will receive at least 3 hours of therapy per day at least 5 days per week. 13. ELOS: 7-10 days       14. Prognosis:  excellent     Meredith Staggers, MD, [place] [facility] 12/04/2015   12/04/2015

## 2015-12-04 ENCOUNTER — Inpatient Hospital Stay (HOSPITAL_COMMUNITY)
Admission: AD | Admit: 2015-12-04 | Discharge: 2015-12-09 | DRG: 947 | Disposition: A | Discharge status: Home Health | Payer: BLUE CROSS/BLUE SHIELD | Source: Intra-hospital | Attending: Physical Medicine & Rehabilitation | Admitting: Physical Medicine & Rehabilitation

## 2015-12-04 DIAGNOSIS — R5381 Other malaise: Secondary | ICD-10-CM | POA: Diagnosis present

## 2015-12-04 DIAGNOSIS — I2102 ST elevation (STEMI) myocardial infarction involving left anterior descending coronary artery: Secondary | ICD-10-CM

## 2015-12-04 DIAGNOSIS — Z87891 Personal history of nicotine dependence: Secondary | ICD-10-CM

## 2015-12-04 DIAGNOSIS — E8809 Other disorders of plasma-protein metabolism, not elsewhere classified: Secondary | ICD-10-CM | POA: Diagnosis present

## 2015-12-04 DIAGNOSIS — Z882 Allergy status to sulfonamides status: Secondary | ICD-10-CM

## 2015-12-04 DIAGNOSIS — Z8674 Personal history of sudden cardiac arrest: Secondary | ICD-10-CM

## 2015-12-04 DIAGNOSIS — Z79899 Other long term (current) drug therapy: Secondary | ICD-10-CM

## 2015-12-04 DIAGNOSIS — J189 Pneumonia, unspecified organism: Secondary | ICD-10-CM | POA: Diagnosis present

## 2015-12-04 DIAGNOSIS — I10 Essential (primary) hypertension: Secondary | ICD-10-CM | POA: Diagnosis not present

## 2015-12-04 DIAGNOSIS — Z91041 Radiographic dye allergy status: Secondary | ICD-10-CM | POA: Diagnosis not present

## 2015-12-04 DIAGNOSIS — Z6824 Body mass index (BMI) 24.0-24.9, adult: Secondary | ICD-10-CM

## 2015-12-04 DIAGNOSIS — R531 Weakness: Principal | ICD-10-CM | POA: Diagnosis present

## 2015-12-04 DIAGNOSIS — Z886 Allergy status to analgesic agent status: Secondary | ICD-10-CM

## 2015-12-04 DIAGNOSIS — E46 Unspecified protein-calorie malnutrition: Secondary | ICD-10-CM | POA: Diagnosis not present

## 2015-12-04 DIAGNOSIS — Z880 Allergy status to penicillin: Secondary | ICD-10-CM | POA: Diagnosis not present

## 2015-12-04 DIAGNOSIS — G9340 Encephalopathy, unspecified: Secondary | ICD-10-CM | POA: Insufficient documentation

## 2015-12-04 DIAGNOSIS — Z9861 Coronary angioplasty status: Secondary | ICD-10-CM

## 2015-12-04 DIAGNOSIS — Z955 Presence of coronary angioplasty implant and graft: Secondary | ICD-10-CM

## 2015-12-04 DIAGNOSIS — I255 Ischemic cardiomyopathy: Secondary | ICD-10-CM | POA: Diagnosis present

## 2015-12-04 DIAGNOSIS — I5021 Acute systolic (congestive) heart failure: Secondary | ICD-10-CM | POA: Diagnosis present

## 2015-12-04 DIAGNOSIS — I2510 Atherosclerotic heart disease of native coronary artery without angina pectoris: Secondary | ICD-10-CM

## 2015-12-04 DIAGNOSIS — R0789 Other chest pain: Secondary | ICD-10-CM | POA: Diagnosis not present

## 2015-12-04 DIAGNOSIS — A047 Enterocolitis due to Clostridium difficile: Secondary | ICD-10-CM | POA: Diagnosis not present

## 2015-12-04 DIAGNOSIS — M05742 Rheumatoid arthritis with rheumatoid factor of left hand without organ or systems involvement: Secondary | ICD-10-CM | POA: Diagnosis not present

## 2015-12-04 DIAGNOSIS — L299 Pruritus, unspecified: Secondary | ICD-10-CM | POA: Diagnosis not present

## 2015-12-04 DIAGNOSIS — A0472 Enterocolitis due to Clostridium difficile, not specified as recurrent: Secondary | ICD-10-CM | POA: Diagnosis present

## 2015-12-04 DIAGNOSIS — M05741 Rheumatoid arthritis with rheumatoid factor of right hand without organ or systems involvement: Secondary | ICD-10-CM | POA: Diagnosis not present

## 2015-12-04 LAB — CBC WITH DIFFERENTIAL/PLATELET
HEMATOCRIT: 37.7 % — AB (ref 39.0–52.0)
Hemoglobin: 12.4 g/dL — ABNORMAL LOW (ref 13.0–17.0)
MCH: 31.3 pg (ref 26.0–34.0)
MCHC: 32.9 g/dL (ref 30.0–36.0)
MCV: 95.2 fL (ref 78.0–100.0)
PLATELETS: 275 10*3/uL (ref 150–400)
RBC: 3.96 MIL/uL — ABNORMAL LOW (ref 4.22–5.81)
RDW: 15.6 % — AB (ref 11.5–15.5)
WBC: 6.1 10*3/uL (ref 4.0–10.5)

## 2015-12-04 MED ORDER — ENOXAPARIN SODIUM 40 MG/0.4ML ~~LOC~~ SOLN
40.0000 mg | SUBCUTANEOUS | Status: DC
  Administered 2015-12-05 – 2015-12-09 (×5): 40 mg via SUBCUTANEOUS
  Filled 2015-12-04 (×6): qty 0.4

## 2015-12-04 MED ORDER — ATORVASTATIN CALCIUM 80 MG PO TABS: 80.0000 mg | ORAL_TABLET | Freq: Every day | ORAL | Status: DC

## 2015-12-04 MED ORDER — TRAZODONE HCL 50 MG PO TABS
25.0000 mg | ORAL_TABLET | Freq: Every evening | ORAL | Status: DC | PRN
  Administered 2015-12-05: 50 mg via ORAL
  Filled 2015-12-04: qty 1

## 2015-12-04 MED ORDER — LORATADINE 10 MG PO TABS
10.0000 mg | ORAL_TABLET | Freq: Every day | ORAL | Status: DC
  Administered 2015-12-05 – 2015-12-09 (×5): 10 mg via ORAL
  Filled 2015-12-04 (×5): qty 1

## 2015-12-04 MED ORDER — TICAGRELOR 90 MG PO TABS: 90.0000 mg | ORAL_TABLET | Freq: Two times a day (BID) | ORAL | Status: DC

## 2015-12-04 MED ORDER — IPRATROPIUM-ALBUTEROL 0.5-2.5 (3) MG/3ML IN SOLN
3.0000 mL | Freq: Two times a day (BID) | RESPIRATORY_TRACT | Status: AC
  Administered 2015-12-05 – 2015-12-07 (×5): 3 mL via RESPIRATORY_TRACT
  Filled 2015-12-04 (×6): qty 3

## 2015-12-04 MED ORDER — METOPROLOL TARTRATE 12.5 MG HALF TABLET
12.5000 mg | ORAL_TABLET | Freq: Two times a day (BID) | ORAL | Status: DC
  Administered 2015-12-04 – 2015-12-09 (×10): 12.5 mg via ORAL
  Filled 2015-12-04 (×10): qty 1

## 2015-12-04 MED ORDER — VANCOMYCIN 50 MG/ML ORAL SOLUTION
500.0000 mg | Freq: Four times a day (QID) | ORAL | Status: AC
  Administered 2015-12-04 – 2015-12-08 (×17): 500 mg via ORAL
  Filled 2015-12-04 (×18): qty 10

## 2015-12-04 MED ORDER — VANCOMYCIN 50 MG/ML ORAL SOLUTION: 500.0000 mg | Freq: Four times a day (QID) | ORAL | Status: DC

## 2015-12-04 MED ORDER — PROCHLORPERAZINE MALEATE 5 MG PO TABS: 5.0000 mg | ORAL_TABLET | Freq: Four times a day (QID) | ORAL | Status: DC | PRN

## 2015-12-04 MED ORDER — HYDROCODONE-ACETAMINOPHEN 5-325 MG PO TABS
1.0000 | ORAL_TABLET | Freq: Four times a day (QID) | ORAL | Status: DC | PRN
  Administered 2015-12-04 – 2015-12-05 (×3): 2 via ORAL
  Administered 2015-12-06: 1 via ORAL
  Administered 2015-12-06: 2 via ORAL
  Administered 2015-12-06 – 2015-12-07 (×2): 1 via ORAL
  Administered 2015-12-07: 2 via ORAL
  Administered 2015-12-07 – 2015-12-08 (×2): 1 via ORAL
  Filled 2015-12-04 (×2): qty 1
  Filled 2015-12-04 (×4): qty 2
  Filled 2015-12-04: qty 1
  Filled 2015-12-04: qty 2
  Filled 2015-12-04 (×2): qty 1

## 2015-12-04 MED ORDER — SALINE SPRAY 0.65 % NA SOLN
1.0000 | NASAL | Status: DC | PRN
Start: 2015-12-04 — End: 2015-12-09
  Administered 2015-12-05 – 2015-12-07 (×2): 1 via NASAL
  Filled 2015-12-04: qty 44

## 2015-12-04 MED ORDER — TAMSULOSIN HCL 0.4 MG PO CAPS
0.4000 mg | ORAL_CAPSULE | Freq: Every day | ORAL | Status: DC
  Administered 2015-12-05 – 2015-12-09 (×5): 0.4 mg via ORAL
  Filled 2015-12-04 (×5): qty 1

## 2015-12-04 MED ORDER — ENSURE ENLIVE PO LIQD: 237.0000 mL | Freq: Two times a day (BID) | ORAL | Status: DC

## 2015-12-04 MED ORDER — ATORVASTATIN CALCIUM 80 MG PO TABS
80.0000 mg | ORAL_TABLET | Freq: Every day | ORAL | Status: DC
  Administered 2015-12-04 – 2015-12-08 (×5): 80 mg via ORAL
  Filled 2015-12-04 (×5): qty 1

## 2015-12-04 MED ORDER — BISACODYL 10 MG RE SUPP: 10.0000 mg | Freq: Every day | RECTAL | Status: DC | PRN

## 2015-12-04 MED ORDER — HYDROCOD POLST-CPM POLST ER 10-8 MG/5ML PO SUER
5.0000 mL | Freq: Two times a day (BID) | ORAL | Status: AC
  Administered 2015-12-04 – 2015-12-06 (×4): 5 mL via ORAL
  Filled 2015-12-04 (×4): qty 5

## 2015-12-04 MED ORDER — NITROGLYCERIN 0.4 MG SL SUBL: 0.4000 mg | SUBLINGUAL_TABLET | SUBLINGUAL | Status: DC | PRN

## 2015-12-04 MED ORDER — GUAIFENESIN 100 MG/5ML PO SOLN
5.0000 mL | ORAL | Status: DC | PRN
Start: 2015-12-04 — End: 2015-12-09

## 2015-12-04 MED ORDER — DIPHENHYDRAMINE HCL 12.5 MG/5ML PO ELIX
12.5000 mg | ORAL_SOLUTION | Freq: Four times a day (QID) | ORAL | Status: DC | PRN
Start: 2015-12-04 — End: 2015-12-09
  Administered 2015-12-07: 25 mg via ORAL
  Filled 2015-12-04: qty 10

## 2015-12-04 MED ORDER — IPRATROPIUM-ALBUTEROL 0.5-2.5 (3) MG/3ML IN SOLN
3.0000 mL | Freq: Four times a day (QID) | RESPIRATORY_TRACT | Status: DC | PRN
  Administered 2015-12-07 – 2015-12-08 (×2): 3 mL via RESPIRATORY_TRACT
  Filled 2015-12-04 (×2): qty 3

## 2015-12-04 MED ORDER — LORATADINE 10 MG PO TABS: 10.0000 mg | ORAL_TABLET | Freq: Every day | ORAL | Status: DC

## 2015-12-04 MED ORDER — GUAIFENESIN-DM 100-10 MG/5ML PO SYRP
5.0000 mL | ORAL_SOLUTION | Freq: Four times a day (QID) | ORAL | Status: DC | PRN
  Administered 2015-12-07: 10 mL via ORAL
  Filled 2015-12-04: qty 10

## 2015-12-04 MED ORDER — ASPIRIN 81 MG PO CHEW: 81.0000 mg | CHEWABLE_TABLET | Freq: Every day | ORAL | Status: DC

## 2015-12-04 MED ORDER — FLEET ENEMA 7-19 GM/118ML RE ENEM: 1.0000 | ENEMA | Freq: Once | RECTAL | Status: DC | PRN

## 2015-12-04 MED ORDER — SACCHAROMYCES BOULARDII 250 MG PO CAPS
250.0000 mg | ORAL_CAPSULE | Freq: Two times a day (BID) | ORAL | Status: DC
Start: 2015-12-04 — End: 2015-12-09
  Administered 2015-12-04 – 2015-12-09 (×10): 250 mg via ORAL
  Filled 2015-12-04 (×10): qty 1

## 2015-12-04 MED ORDER — ASPIRIN 81 MG PO CHEW
81.0000 mg | CHEWABLE_TABLET | Freq: Every day | ORAL | Status: DC
  Administered 2015-12-05 – 2015-12-09 (×5): 81 mg via ORAL
  Filled 2015-12-04 (×5): qty 1

## 2015-12-04 MED ORDER — TRAZODONE HCL 50 MG PO TABS
50.0000 mg | ORAL_TABLET | Freq: Every day | ORAL | Status: DC
  Administered 2015-12-04 – 2015-12-08 (×5): 50 mg via ORAL
  Filled 2015-12-04 (×5): qty 1

## 2015-12-04 MED ORDER — GERHARDT'S BUTT CREAM
TOPICAL_CREAM | CUTANEOUS | Status: DC | PRN
  Filled 2015-12-04: qty 1

## 2015-12-04 MED ORDER — PROCHLORPERAZINE 25 MG RE SUPP
12.5000 mg | Freq: Four times a day (QID) | RECTAL | Status: DC | PRN
Start: 2015-12-04 — End: 2015-12-09

## 2015-12-04 MED ORDER — ALUM & MAG HYDROXIDE-SIMETH 200-200-20 MG/5ML PO SUSP: 30.0000 mL | ORAL | Status: DC | PRN

## 2015-12-04 MED ORDER — FLUTICASONE PROPIONATE 50 MCG/ACT NA SUSP
2.0000 | Freq: Every day | NASAL | Status: DC
  Administered 2015-12-05 – 2015-12-09 (×5): 2 via NASAL

## 2015-12-04 MED ORDER — METOPROLOL TARTRATE 25 MG PO TABS: 12.5000 mg | ORAL_TABLET | Freq: Two times a day (BID) | ORAL | Status: DC

## 2015-12-04 MED ORDER — CALCIUM POLYCARBOPHIL 625 MG PO TABS
1250.0000 mg | ORAL_TABLET | Freq: Every day | ORAL | Status: DC
  Administered 2015-12-05 – 2015-12-09 (×5): 1250 mg via ORAL
  Filled 2015-12-04 (×6): qty 2

## 2015-12-04 MED ORDER — ACETAMINOPHEN 325 MG PO TABS
650.0000 mg | ORAL_TABLET | ORAL | Status: DC | PRN
  Administered 2015-12-06 – 2015-12-09 (×4): 650 mg via ORAL
  Filled 2015-12-04 (×4): qty 2

## 2015-12-04 MED ORDER — TICAGRELOR 90 MG PO TABS
90.0000 mg | ORAL_TABLET | Freq: Two times a day (BID) | ORAL | Status: DC
  Administered 2015-12-04 – 2015-12-09 (×10): 90 mg via ORAL
  Filled 2015-12-04 (×12): qty 1

## 2015-12-04 MED ORDER — PROCHLORPERAZINE EDISYLATE 5 MG/ML IJ SOLN: 5.0000 mg | Freq: Four times a day (QID) | INTRAMUSCULAR | Status: DC | PRN

## 2015-12-04 MED ORDER — METRONIDAZOLE IN NACL 5-0.79 MG/ML-% IV SOLN
500.0000 mg | Freq: Three times a day (TID) | INTRAVENOUS | Status: DC
  Administered 2015-12-04 – 2015-12-05 (×2): 500 mg via INTRAVENOUS
  Filled 2015-12-04 (×2): qty 100

## 2015-12-04 MED ORDER — FAMOTIDINE 40 MG/5ML PO SUSR
20.0000 mg | Freq: Two times a day (BID) | ORAL | Status: DC
Start: 2015-12-04 — End: 2015-12-09
  Administered 2015-12-04 – 2015-12-08 (×9): 20 mg via ORAL
  Filled 2015-12-04 (×12): qty 2.5

## 2015-12-04 NOTE — Progress Notes (Signed)
Physical Therapy Treatment Patient Details Name: [name] MRN: [id number] DOB: Jun 30, 1946 Today's Date: 12/04/2015    History of Present Illness 69 y.o. male w/ PMHx significant for rheumatoid arthritis and HTN who presented to [facility] on 11/11/2015 after an out-of-hospital cardiac arrest. The patient reportedly had chest and arm pain intermittently for 2 weeks after sustaining a mechanical fall. Today he had worsening of his chest pain and he ultimately arrested at home, witnessed by his wife. First responder delivered 2 shocks via an AED and the patient had ROSC with this. An EKG in the field was suggestive of acute anterior STEMI  Patient intubated 11/29-12/8.  Pt reintubated 12/13 for laryngeal edema and PT signed off.  Pt extubated 12/16.  PT reordered 12/16 over weekend.      PT Comments    Pt progressing steadily with general weakness, decreased activity tolerance.  Reports ready to get moving.  Follow Up Recommendations  CIR     Equipment Recommendations  Other (comment)    Recommendations for Other Services Rehab consult     Precautions / Restrictions Precautions Precautions: Fall    Mobility  Bed Mobility Overal bed mobility: Needs Assistance Bed Mobility: Supine to Sit Rolling: Min guard         General bed mobility comments: extra time, but no assisit  Transfers Overall transfer level: Needs assistance Equipment used: Rolling walker (2 wheeled) Transfers: Sit to/from [facility] Sit to Stand: Min assist Stand pivot transfers: Min assist       General transfer comment: cues for hand placement, steady assist  Ambulation/Gait Ambulation/Gait assistance: Min assist Ambulation Distance (Feet): 200 Feet Assistive device: Rolling walker (2 wheeled) Gait Pattern/deviations: Step-through pattern Gait velocity: slower   General Gait Details: mildly unsteady at times especially with scanning   Stairs             Wheelchair Mobility    Modified Rankin (Stroke Patients Only)       Balance Overall balance assessment: Needs assistance Sitting-balance support: No upper extremity supported Sitting balance-Leahy Scale: Fair     Standing balance support: Bilateral upper extremity supported Standing balance-Leahy Scale: Fair Standing balance comment: able to stand statically without assistive device                    Cognition Arousal/Alertness: Awake/alert Behavior During Therapy: WFL for tasks assessed/performed Overall Cognitive Status: Within Functional Limits for tasks assessed         Following Commands: Follows one step commands consistently            Exercises General Exercises - Upper Extremity Elbow Flexion: AROM;Both;10 reps;Supine Elbow Extension: AROM;10 reps;Supine General Exercises - Lower Extremity Hip Flexion/Marching: AROM;Both;10 reps;Seated Toe Raises:  (graded resistance)    General Comments        Pertinent Vitals/Pain Pain Assessment: Faces Pain Score: 4  Pain Location: chest from compressions Pain Descriptors / Indicators: Sore Pain Intervention(s): Monitored during session    Home Living                      Prior Function            PT Goals (current goals can now be found in the care plan section) Acute Rehab PT Goals Patient Stated Goal: go home PT Goal Formulation: With patient Time For Goal Achievement: 12/15/15 Potential to Achieve Goals: Good Progress towards PT goals: Progressing toward goals    Frequency  Min 3X/week  PT Plan Current plan remains appropriate    Co-evaluation             End of Session     Patient left: in chair;with call bell/phone within reach     Time: [id number] PT Time Calculation (min) (ACUTE ONLY): 29 min  Charges:  $Gait Training: 8-22 mins $Therapeutic Exercise: 8-22 mins                    G Codes:      Mariluz Crespo, Tessie Fass 12/04/2015, 1:41  PM 12/04/2015  Donnella Sham, PT [phone]  (pager)

## 2015-12-04 NOTE — Progress Notes (Signed)
Rehab admissions - I have a bed a rehab today.  Will admit to inpatient rehab today.  Patient and wife are agreeable.  Call me for questions.  [phone]

## 2015-12-04 NOTE — Progress Notes (Addendum)
Pt in stable condition, went over discharge instructions with pt and family,they verbalises understanding paper prescriptions given to pt, pt belongings at bedside. Pt being discharged to 4w17.Report called out to nurse on 4W.Pt transferred via wheelchair by a tech. Medications tubed to 4 w. Oren Beckmann, RN

## 2015-12-04 NOTE — Progress Notes (Signed)
Ankit Lorie Phenix, MD Physician Signed Physical Medicine and Rehabilitation Consult Note 12/01/2015 1:26 PM  Related encounter: ED to Hosp-Admission (Current) from 11/11/2015 in [place] [facility]        Physical Medicine and Rehabilitation Consult  Reason for Consult: Debility after cardiac arrest with multiple medical issues Referring Physician: Dr. Etter Sjogren   HPI: [name] is a 69 y.o. male with history of RA, impaired glucose tolerance, HTN who was admitted to [facility] on 11/11/15 with 2 week history of intermittent CP and witnessed cardiac arrest. Family initiated CPR, he was treated with ACLS protocol and EKG in field showed evidence of NSTEMI. He was unresponsive and intubated in ED and taken to cath lab emergently. Cardiac cath revealed occlusive LAD lesion which was treated with PCI and DES by Dr. Burt Knack and was treated with hypothermia protocol. Hospital course significant for respiratory failure due to HCAP as well as laryngeal edema post extubation 12/08 requiring reintubation on 12/13 due to increase in WOB and lethargy. Diarrhea due to c diff colitis treated with vanc and flagyl with improvement in leucocytosis. He tolerated extubation on 12/15 and was started on dysphagia 2, nectar liquids due to dysphagia. Patient with resultant weakness with inability to ambulate as well as cognitive deficits requiring multiple cues to maintain safe swallow strategies. CIR recommended for follow up therapy.   Review of Systems  Constitutional: Positive for malaise/fatigue.  HENT: Positive for sore throat. Negative for hearing loss.  Eyes: Negative for blurred vision and double vision.  Respiratory: Positive for cough. Negative for shortness of breath and wheezing.  Cardiovascular: Positive for chest pain (MSK).  Gastrointestinal: Positive for diarrhea. Negative for heartburn, nausea and abdominal pain.  Genitourinary: Negative  for dysuria and frequency.  Musculoskeletal: Positive for back pain and joint pain (diffuse joint pain--left pelvis worse since fracture this year. ). Negative for myalgias.  Neurological: Positive for speech change and weakness. Negative for dizziness, tingling and headaches.  Psychiatric/Behavioral: Positive for memory loss.  All other systems reviewed and are negative.   Past Medical History  Diagnosis Date  . Rheumatoid arthritis(714.0) 1998  . Nephrolithiasis 1968  . Insomnia   . Allergic rhinitis   . Impaired glucose tolerance   . Hypertension   . ST elevation (STEMI) myocardial infarction involving left anterior descending coronary artery ([place]) 11/11/2015    100% LAD, Cardiogenic Shock. -- EF ~20-25%  . Cardiac arrest with ventricular fibrillation ([place]) 11/11/2015  . CAD S/P PCI OF pLAD with 3.5 mm x38 mm Promus DES (3.75 mm) 11/12/2015  . Cardiomyopathy, ischemic - EF 20-25% by LV Gram 11/12/2015    There is severe left ventricular systolic dysfunction. The left ventricular ejection fraction is 25-35% by visual estimate. There are wall motion abnormalities in the left ventricle. There are segmental wall motion abnormalities in the left ventricle. The anterolateral and periapical LV walls are akinetic with an estimated LVEF of 25%     Past Surgical History  Procedure Laterality Date  . Status post multiple lithotripy and urological surgery for reccurent calcium oxalate stones    . Cardiac catheterization N/A 11/11/2015    Procedure: Left Heart Cath and Cors/Grafts Angiography; Surgeon: Sherren Mocha, MD; Location: [place] [facility]; Service: Cardiovascular; Laterality: N/A;  . Cardiac catheterization N/A 11/11/2015    Procedure: Coronary Stent Intervention; Surgeon: Sherren Mocha, MD; Location: [place] [facility]; Service: Cardiovascular; Laterality: N/A; prox lad 3.5x38 promus  Family History    Problem Relation Age of Onset  . Cancer Mother     pancreatic ; pulmonary embolism   . Diabetes Father      Social History: Married. 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. Wife works out of home and can provide intermittent care after discharge. He reports that he has quit smoking 30+ years ago. He has never used smokeless tobacco. He reports that he drinks alcohol couple times a year. He does not use illicit drugs.  Allergies  Allergen Reactions  . Contrast Media [Iodinated Diagnostic Agents] Hives    03/28/14 pt received 1 hour emergent prep and pt did good and had no complaints after scan.  . Ibuprofen     REACTION: unspecified  . Penicillins     REACTION: unspecified  . Sulfamethoxazole     REACTION: unspecified     Medications Prior to Admission  Medication Sig Dispense Refill  . Cetirizine HCl 10 MG CAPS Take 10 mg by mouth daily.    . Cholecalciferol (VITAMIN D-3) 1000 UNITS CAPS Take 2,000 Units by mouth daily.     . Coenzyme Q10 (CO Q 10) 10 MG CAPS Take 10 mg by mouth daily.    . fexofenadine (ALLEGRA) 30 MG tablet Take 30 mg by mouth daily.    . fluticasone (FLONASE) 50 MCG/ACT nasal spray USE 2 SPRAYS INTO NOSE DAILY 48 g 3  . folic acid (FOLVITE) 1 MG tablet Take 1 mg by mouth daily.     . Golimumab ([place] ARIA IV) Inject into the vein. Infusion every 8 weeks    . hydrochlorothiazide (HYDRODIURIL) 25 MG tablet Take 1 tablet daily (Patient taking differently: Take 25 mg by mouth daily) 90 tablet 1  . HYDROcodone-acetaminophen (NORCO/VICODIN) 5-325 MG per tablet Take 1-2 tablets by mouth every 6 (six) hours as needed for moderate pain. 20 tablet 0  . meloxicam (MOBIC) 7.5 MG tablet Take 1 tablet (7.5 mg total) by mouth 2 (two) times daily. 90 tablet 3  . methotrexate (RHEUMATREX) 2.5 MG tablet Take 20 mg by mouth once a week. 8 tablets every week on Sunday  Morning Caution:Chemotherapy. Protect from light.    . Probiotic Product (PRO-BIOTIC BLEND PO) Take 1 tablet by mouth daily.    . saw palmetto 160 MG capsule Take 320 mg by mouth daily.     . tamsulosin (FLOMAX) 0.4 MG CAPS capsule Take 1 capsule (0.4 mg total) by mouth daily. 90 capsule 0  . traMADol (ULTRAM) 50 MG tablet Take 50 mg by mouth every 6 (six) hours as needed for moderate pain.    . traZODone (DESYREL) 100 MG tablet Take 1 tablet (100 mg total) by mouth at bedtime. 90 tablet 1    Home: Home Living Family/patient expects to be discharged to:: Private residence Living Arrangements: Spouse/significant other Available Help at Discharge: Friend(s), Available PRN/intermittently (wife works from home but she is upstairs and pt down) Type of Home: House Home Access: Stairs to enter Technical brewer of Steps: 4 Entrance Stairs-Rails: Right [place]: Two level, Able to live on main level with bedroom/bathroom Bathroom Shower/Tub: Chiropodist: Standard Home Equipment: Environmental consultant - 4 wheels Additional Comments: Wife states in April when pt injured, pt stayed on first level and borrowed a 3N1 and slept in [place]. He was able to get to his 3N1 without her help. She works upstairs in her office and cannot provide 24/7 care to him.   Functional History: Prior Function Level of Independence:  Independent Comments: did part time work for [employer], wife can work from home Functional Status:  Mobility: Bed Mobility Overal bed mobility: Needs Assistance Bed Mobility: Rolling, Sit to Supine Rolling: Min assist Sidelying to sit: Max assist Sit to supine: Min assist, +2 for physical assistance General bed mobility comments: pt in chair on arrival Transfers Overall transfer level: Needs assistance Equipment used: Rolling walker (2 wheeled) Transfers: Sit to/from Stand Sit to Stand: Mod assist, +2 physical assistance Squat pivot  transfers: Max assist, Total assist, +2 physical assistance General transfer comment: Took a few attempts to power up needing assist. once up pt unsteady.  Ambulation/Gait General Gait Details: pt unable to ambulate at this time    ADL:    Cognition: Cognition Overall Cognitive Status: Impaired/Different from baseline Orientation Level: Oriented X4 Cognition Arousal/Alertness: Awake/alert Behavior During Therapy: Anxious, Restless Overall Cognitive Status: Impaired/Different from baseline Area of Impairment: Orientation, Following commands, Safety/judgement, Problem solving Orientation Level: Disoriented to, Situation, Place Following Commands: Follows one step commands inconsistently, Follows one step commands with increased time Safety/Judgement: Decreased awareness of safety, Decreased awareness of deficits Problem Solving: Decreased initiation, Difficulty sequencing, Requires verbal cues, Requires tactile cues, Slow processing General Comments: very slow to respond to mobility commands as if planning issue, but pt also globally weak; stool incontinence continues with poor awareness. Poor awareness of deficits by pt.    Blood pressure 108/63, pulse 96, temperature 97.4 F (36.3 C), temperature source Oral, resp. rate 23, height 5\' 7"  (1.702 m), weight 77.6 kg (171 lb 1.2 oz), SpO2 96 %. Physical Exam  Nursing note and vitals reviewed. Constitutional: He is oriented to person, place, and time. He appears well-developed and well-nourished.  HENT:  Head: Normocephalic and atraumatic.  Mouth/Throat: Oropharynx is clear and moist.  Eyes: Conjunctivae and EOM are normal. Pupils are equal, round, and reactive to light.  Neck: Normal range of motion. Neck supple.  Cardiovascular: Normal rate and regular rhythm.  Respiratory: Effort normal and breath sounds normal. He exhibits tenderness.  GI: Soft. Bowel sounds are normal. He exhibits no distension. There is no tenderness.    Musculoskeletal: He exhibits tenderness (Chest wall). He exhibits no edema.  Neurological: He is alert and oriented to person, place, and time.  Dysphonic speech.  Able to follow basic motor commands without difficulty.  Sensation intact to light touch Motor: B/L UE: 4 -/5 proximal distal B/LE: 4+/5 proximal to distal  Skin: Skin is warm and dry.  Dressings C/D/I \ Scattered bruising  Psychiatric: Cognition and memory are impaired.     Lab Results Last 24 Hours    Results for orders placed or performed during the hospital encounter of 11/11/15 (from the past 24 hour(s))  Glucose, capillary Status: Abnormal   Collection Time: 11/30/15 4:38 PM  Result Value Ref Range   Glucose-Capillary 177 (H) 65 - 99 mg/dL  Glucose, capillary Status: Abnormal   Collection Time: 11/30/15 8:46 PM  Result Value Ref Range   Glucose-Capillary 208 (H) 65 - 99 mg/dL  Glucose, capillary Status: Abnormal   Collection Time: 11/30/15 11:42 PM  Result Value Ref Range   Glucose-Capillary 163 (H) 65 - 99 mg/dL  Glucose, capillary Status: Abnormal   Collection Time: 12/01/15 3:00 AM  Result Value Ref Range   Glucose-Capillary 126 (H) 65 - 99 mg/dL  Glucose, capillary Status: Abnormal   Collection Time: 12/01/15 7:54 AM  Result Value Ref Range   Glucose-Capillary 145 (H) 65 - 99 mg/dL   Comment 1 Capillary Specimen  Glucose, capillary Status: Abnormal   Collection Time: 12/01/15 12:27 PM  Result Value Ref Range   Glucose-Capillary 233 (H) 65 - 99 mg/dL      Imaging Results (Last 48 hours)    No results found.    Assessment/Plan: Diagnosis: Debility after cardiac arrest Labs and images independently reviewed. Records reviewed and summated above.  1. Does the need for close, 24 hr/day medical supervision in concert with the patient's rehab needs make it unreasonable for this patient to be served in a  less intensive setting? Yes  2. Co-Morbidities requiring supervision/potential complications: dysphagia (cont SLP and advance diet as tolerated), Diarrhea (improving, continue to monitor), RA (ensure pain does not limit therapies, monitor for signs of flares), impaired glucose tolerance (continue to monitor CBGs, adjust medications in accordance with increased activity), HTN (currently labile monitor and provide prns in accordance with increased physical exertion and pain), tachypnea (monitor with increased physical activity) hypernatremia (trending down, continue to monitor), leukocytosis (trending down continue to monitor), anemia (transfuse if necessary to ensure appropriate perfusion for increased activity tolerance), thrombocytosis (likely reactive, continue to monitor), CHF (Monitor in accordance with increased physical activity and avoid UE resistance exercises) 3. Due to safety, skin/wound care, disease management, medication administration, pain management and patient education, does the patient require 24 hr/day rehab nursing? Yes 4. Does the patient require coordinated care of a physician, rehab nurse, PT (1-2 hrs/day, 5 days/week), OT (1-2 hrs/day, 5 days/week) and SLP (1-2 hrs/day, 5 days/week) to address physical and functional deficits in the context of the above medical diagnosis(es)? Yes Addressing deficits in the following areas: balance, endurance, locomotion, strength, transferring, bathing, dressing, grooming, toileting, speech, swallowing and psychosocial support 5. Can the patient actively participate in an intensive therapy program of at least 3 hrs of therapy per day at least 5 days per week? Yes 6. The potential for patient to make measurable gains while on inpatient rehab is excellent 7. Anticipated functional outcomes upon discharge from inpatient rehab are modified independent and supervision with PT, modified independent and supervision with OT, independent and modified  independent with SLP. 8. Estimated rehab length of stay to reach the above functional goals is: 13-16 days. 9. Does the patient have adequate social supports and living environment to accommodate these discharge functional goals? Yes 10. Anticipated D/C setting: Home 11. Anticipated post D/C treatments: HH therapy and Home excercise program 12. Overall Rehab/Functional Prognosis: good  RECOMMENDATIONS: This patient's condition is appropriate for continued rehabilitative care in the following setting: CIR Patient has agreed to participate in recommended program. Yes Note that insurance prior authorization may be required for reimbursement for recommended care.  Comment: Rehab Admissions Coordinator to follow up  Delice Lesch, MD 12/01/2015       Revision History     Date/Time User Provider Type Action   12/01/2015 3:58 PM Ankit Lorie Phenix, MD Physician Sign   12/01/2015 2:50 PM Bary Leriche, PA-C Physician Assistant Pend   View Details Report       Routing History     Date/Time From To Method   12/01/2015 3:58 PM Ankit Lorie Phenix, MD Ankit Lorie Phenix, MD In [facility]   12/01/2015 3:58 PM Ankit Lorie Phenix, MD Marletta Lor, MD In [facility]

## 2015-12-04 NOTE — H&P (View-Only) (Signed)
Physical Medicine and Rehabilitation Admission H&P    Chief Complaint  Patient presents with  . Debility after cardiac arrest    HPI:   [name] is a 69 y.o. male with history of RA, impaired glucose tolerance, HTN who was admitted to [facility] on 11/11/15 with 2 week history of intermittent CP and witnessed cardiac arrest. Family initiated CPR, he was treated with ACLS protocol and EKG in field showed evidence of NSTEMI. He was unresponsive and intubated in ED and taken to cath lab emergently. Cardiac cath revealed occlusive LAD lesion which was treated with PCI and DES by Dr. Burt Knack and was treated with hypothermia protocol. Hospital course significant for respiratory failure due to HCAP as well as laryngeal edema post extubation 12/08 requiring reintubation on 12/13 due to increase in WOB and lethargy. Diarrhea due to c diff colitis treated with vanc and flagyl with improvement in leucocytosis. He tolerated extubation on 12/15 and was started on dysphagia 2, nectar liquids. Repeat swallow study done today and patient advanced to dysphagia 3, thin liquids. Patient with resultant weakness with unsteady gait and inability to complete ADL tasks. CIR recommended for follow up therapy.    Review of Systems  Constitutional: Negative for fever, chills and malaise/fatigue.  HENT: Positive for sore throat. Negative for hearing loss.   Eyes: Negative for blurred vision and double vision.  Respiratory: Positive for cough. Negative for hemoptysis.   Cardiovascular: Positive for chest pain (chest wall pain). Negative for palpitations and orthopnea.  Gastrointestinal: Positive for diarrhea. Negative for heartburn, nausea and vomiting.       Rectal pain due to diarrhea--improving  Genitourinary: Negative for dysuria and urgency.  Musculoskeletal: Positive for myalgias, back pain and joint pain.  Skin: Negative for rash.  Neurological: Negative for dizziness, tingling, tremors, sensory change  and headaches.  Psychiatric/Behavioral: Positive for memory loss. Negative for depression and suicidal ideas. The patient is nervous/anxious. The patient does not have insomnia.     Past Medical History  Diagnosis Date  . Rheumatoid arthritis(714.0) 1998  . Nephrolithiasis 1968  . Insomnia   . Allergic rhinitis   . Impaired glucose tolerance   . Hypertension   . ST elevation (STEMI) myocardial infarction involving left anterior descending coronary artery ([place]) 11/11/2015    100% LAD, Cardiogenic Shock. -- EF ~20-25%  . Cardiac arrest with ventricular fibrillation ([place]) 11/11/2015  . CAD S/P PCI OF pLAD with 3.5 mm x38 mm Promus DES (3.75 mm) 11/12/2015  . Cardiomyopathy, ischemic - EF 20-25% by LV Gram 11/12/2015    There is severe left ventricular systolic dysfunction. The left ventricular ejection fraction is 25-35% by visual estimate. There are wall motion abnormalities in the left ventricle. There are segmental wall motion abnormalities in the left ventricle. The anterolateral and periapical LV walls are akinetic with an estimated LVEF of 25%   . Pelvic fracture ([place]) 09/2015  . Fractured rib     Past Surgical History  Procedure Laterality Date  . Status post multiple lithotripy and urological surgery for reccurent calcium oxalate stones    . Cardiac catheterization N/A 11/11/2015    Procedure: Left Heart Cath and Cors/Grafts Angiography;  Surgeon: Sherren Mocha, MD;  Location: [facility];  Service: Cardiovascular;  Laterality: N/A;  . Cardiac catheterization N/A 11/11/2015    Procedure: Coronary Stent Intervention;  Surgeon: Sherren Mocha, MD;  Location: [facility];  Service: Cardiovascular;  Laterality: N/A;  prox lad 3.5x38 promus  Family History  Problem Relation Age of Onset  . Cancer Mother     pancreatic ; pulmonary embolism   . Diabetes Father     Social History: Married. 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. Wife works out of home and  can provide intermittent care after discharge. He reports that he has quit smoking 30+ years ago. He has never used smokeless tobacco. He reports that he drinks alcohol couple times a year. He does not use illicit drugs.    Allergies  Allergen Reactions  . Contrast Media [Iodinated Diagnostic Agents] Hives    03/28/14 pt received 1 hour emergent prep and pt did good and had no complaints after scan.  . Ibuprofen     REACTION: unspecified  . Penicillins     REACTION: unspecified  . Sulfamethoxazole     REACTION: unspecified    Medications Prior to Admission  Medication Sig Dispense Refill  . Cetirizine HCl 10 MG CAPS Take 10 mg by mouth daily.    . Cholecalciferol (VITAMIN D-3) 1000 UNITS CAPS Take 2,000 Units by mouth daily.     . Coenzyme Q10 (CO Q 10) 10 MG CAPS Take 10 mg by mouth daily.    . fexofenadine (ALLEGRA) 30 MG tablet Take 30 mg by mouth daily.    . fluticasone (FLONASE) 50 MCG/ACT nasal spray USE 2 SPRAYS INTO NOSE DAILY 48 g 3  . folic acid (FOLVITE) 1 MG tablet Take 1 mg by mouth daily.      . Golimumab ([place] ARIA IV) Inject into the vein. Infusion every 8 weeks    . hydrochlorothiazide (HYDRODIURIL) 25 MG tablet Take 1 tablet daily (Patient taking differently: Take 25 mg by mouth daily) 90 tablet 1  . HYDROcodone-acetaminophen (NORCO/VICODIN) 5-325 MG per tablet Take 1-2 tablets by mouth every 6 (six) hours as needed for moderate pain. 20 tablet 0  . meloxicam (MOBIC) 7.5 MG tablet Take 1 tablet (7.5 mg total) by mouth 2 (two) times daily. 90 tablet 3  . methotrexate (RHEUMATREX) 2.5 MG tablet Take 20 mg by mouth once a week. 8 tablets every week on Sunday Morning Caution:Chemotherapy. Protect from light.    . Probiotic Product (PRO-BIOTIC BLEND PO) Take 1 tablet by mouth daily.    . saw palmetto 160 MG capsule Take 320 mg by mouth daily.     . tamsulosin (FLOMAX) 0.4 MG CAPS capsule Take 1 capsule (0.4 mg total) by mouth daily. 90 capsule 0  . traMADol (ULTRAM)  50 MG tablet Take 50 mg by mouth every 6 (six) hours as needed for moderate pain.    . traZODone (DESYREL) 100 MG tablet Take 1 tablet (100 mg total) by mouth at bedtime. 90 tablet 1    Home: Home Living Family/patient expects to be discharged to:: Unsure Living Arrangements: Spouse/significant other Available Help at Discharge: Friend(s), Available PRN/intermittently (wife works form home but she is upstairs and pt down) Type of Home: House Home Access: Stairs to enter Technical brewer of Steps: 4 Entrance Stairs-Rails: Right Home Layout: Two level, Able to live on main level with bedroom/bathroom Bathroom Shower/Tub: Chiropodist: Standard Home Equipment: Environmental consultant - 4 wheels Additional Comments: Wife states in April when pt injured, pt stayed on first level and borrowed a 3N1 and slept in recliner.  He was able to get to his 3N1 without her help.  She works upstairs in her office and cannot provide 24/7 care to him.     Functional History: Prior Function  Level of Independence: Independent Comments: did part time work for [employer], wife can work from home  Functional Status:  Mobility: Bed Mobility Overal bed mobility: Needs Assistance Bed Mobility: Rolling, Sit to Supine Rolling: Min assist Sidelying to sit: Max assist Sit to supine: Min assist, +2 for physical assistance General bed mobility comments: not assessed Transfers Overall transfer level: Needs assistance Equipment used: Rolling walker (2 wheeled) Transfers: Sit to/from Stand, [employer] Transfers Sit to Stand: Min assist (Min assist-Min guard) Stand pivot transfers: Min guard Squat pivot transfers: Max assist, Total assist, +2 physical assistance General transfer comment: Took pivotal steps to/from [facility].  Ambulation/Gait Ambulation/Gait assistance: Mod assist, +2 physical assistance Ambulation Distance (Feet): 12 Feet Assistive device: Rolling walker (2 wheeled) Gait Pattern/deviations:  Step-through pattern, Decreased stride length, Trunk flexed, Staggering left, Staggering right, Leaning posteriorly General Gait Details: Pt began ambulation and needed cues and assist to sequence steps and to steer rW.  At one point, pts right hand slipped off RW and pt needed mod assist to recover balance.  Pt generally unsteady with several LOB during ambulation in which pt needed assist to recover.  Pt lacks safety awareness to self correct.     Gait velocity interpretation: Below normal speed for age/gender    ADL: ADL Overall ADL's : Needs assistance/impaired Upper Body Dressing : Set up, Sitting Lower Body Dressing: Min guard, Sit to/from stand Toilet Transfer: Minimal assistance, Stand-pivot, RW, BSC (Min guard for stand pivot/pivotal steps; Min guard-Min ) Toileting- Water quality scientist and Hygiene: Min guard, Sit to/from stand Functional mobility during ADLs: Min guard, Rolling walker (stand pivot/pivotal steps)  Cognition: Cognition Overall Cognitive Status: No family/caregiver present to determine baseline cognitive functioning Orientation Level: Oriented X4 Cognition Arousal/Alertness: Awake/alert Behavior During Therapy: WFL for tasks assessed/performed Overall Cognitive Status: No family/caregiver present to determine baseline cognitive functioning Area of Impairment: Orientation, Memory, Safety/judgement, Problem solving Orientation Level: Disoriented to, Time Memory: Decreased short-term memory Following Commands: Follows one step commands inconsistently, Follows one step commands with increased time Safety/Judgement: Decreased awareness of safety, Decreased awareness of deficits Problem Solving: Slow processing General Comments: very slow to respond to mobility commands as if planning issue, but pt also globally weak; stool incontinence continues with poor awareness.  Poor awareness of deficits by pt.     Blood pressure 112/56, pulse 95, temperature 98.4 F (36.9  C), temperature source Oral, resp. rate 18, height _0  (1.702 m), weight 81.421 kg (179 lb 8 oz), SpO2 97 %. Physical Exam  Nursing note and vitals reviewed. Constitutional: He is oriented to person, place, and time. He appears well-developed and well-nourished.  HENT:  Head: Normocephalic and atraumatic.  Right Ear: External ear normal.  Left Ear: External ear normal.  Mouth/Throat: Oropharynx is clear and moist. No oropharyngeal exudate.  Voice slightly hoarse  Eyes: Conjunctivae and EOM are normal. Pupils are equal, round, and reactive to light. Right eye exhibits no discharge. Left eye exhibits no discharge.  Neck: Normal range of motion. Neck supple. No tracheal deviation present. No thyromegaly present.  Cardiovascular: Normal rate and regular rhythm.  Exam reveals no friction rub.   No murmur heard. Respiratory: Effort normal. No respiratory distress. He has wheezes. He has no rales.  Continues to have frequent irritative coughing spells.   GI: Soft. Bowel sounds are normal. He exhibits no distension. There is no tenderness. There is no rebound.  Genitourinary: Penis normal.  Musculoskeletal: He exhibits no edema or tenderness.  Chest wall discomfort with palpation, trunk  movements, deep breathing.   Neurological: He is alert and oriented to person, place, and time. No cranial nerve deficit.  Voice is stronger but still dysphonic at times. Able to follow basic commands without difficulty UE: motor 5/5 delt,bicep,wrist ext, tricep, HI. LE:   3 to 3+ HF, 3+ to 4 KE and 4/5 ADF/APF. No sensory changes. Does not recall any of events of incident. ?mild STM deficits. Otherwise has good insight and awareness. Attention reasonable  Skin: Skin is warm and dry.  Psychiatric: He has a normal mood and affect. His behavior is normal.    Results for orders placed or performed during the hospital encounter of 11/11/15 (from the past 48 hour(s))  Basic metabolic panel     Status: Abnormal    Collection Time: 12/03/15  5:10 AM  Result Value Ref Range   Sodium 144 135 - 145 mmol/L   Potassium 3.8 3.5 - 5.1 mmol/L   Chloride 115 (H) 101 - 111 mmol/L   CO2 20 (L) 22 - 32 mmol/L   Glucose, Bld 199 (H) 65 - 99 mg/dL   BUN 15 6 - 20 mg/dL   Creatinine, Ser 0.85 0.61 - 1.24 mg/dL   Calcium 8.1 (L) 8.9 - 10.3 mg/dL   GFR calc non Af Amer >60 >60 mL/min   GFR calc Af Amer >60 >60 mL/min    Comment: (NOTE) The eGFR has been calculated using the CKD EPI equation. This calculation has not been validated in all clinical situations. eGFR's persistently <60 mL/min signify possible Chronic Kidney Disease.    Anion gap 9 5 - 15     Medical Problem List and Plan: 1.  Debility, dysphagia, ?mild cognitive deficits secondary to secondary to cardiac arrest, anoxia 2.  DVT Prophylaxis/Anticoagulation: Pharmaceutical: Lovenox 3. Pain Management: tylenol prn for chest wall pain.  4. Mood: LCSW to follow for evaluation and support.  5. Neuropsych: This patient is capable of making decisions on her own behalf. 6. Skin/Wound Care: Routine pressure relief measures. Maintain adequate nutrition and hydration status.  7. Fluids/Electrolytes/Nutrition: Monitor I/O. Check lytes in am. Monitor potassium and sodium levels with diarrhea ongoing.  8. RA: Methotrexate on hold--to follow up with Rhemumatology regarding resumption. No NSAIDS/Mobic per cards.  9. HTN: Monitor with BID checks.  10. CAD with V fib arrest/STEMI: On ASA and Brillinta due to LAD DES as well as BB and statin 11. C diff colitis:  On po flagyl and vancomycin D # 11/14. Educated (and allayed fear) patient on need for ongoing precautions.  12. HCAP: Has completed aztreoman for treatment. Continues to have ongoing cough despite scheduled robitussin. Will change to Tussionex for 48 hours to help manage symptoms.  13. Acute systolic CHF:  On metoprolol bid. Check daily weights. Low salt diet.  14. Chest wall pain:  Educate on splinting  when coughing to help with pain management.    Post Admission Physician Evaluation: 1. Functional deficits secondary  to debility, cognitive/swallowing dysfunction after cardiac arrest and multiple medical. 2. Patient is admitted to receive collaborative, interdisciplinary care between the physiatrist, rehab nursing staff, and therapy team. 3. Patient's level of medical complexity and substantial therapy needs in context of that medical necessity cannot be provided at a lesser intensity of care such as a SNF. 4. Patient has experienced substantial functional loss from his/her baseline which was documented above under the "Functional History" and "Functional Status" headings.  Judging by the patient's diagnosis, physical exam, and functional history, the patient has potential for  functional progress which will result in measurable gains while on inpatient rehab.  These gains will be of substantial and practical use upon discharge  in facilitating mobility and self-care at the household level. 5. Physiatrist will provide 24 hour management of medical needs as well as oversight of the therapy plan/treatment and provide guidance as appropriate regarding the interaction of the two. 6. 24 hour rehab nursing will assist with bladder management, bowel management, safety, skin/wound care, disease management, medication administration, pain management and patient education  and help integrate therapy concepts, techniques,education, etc. 7. PT will assess and treat for/with: Lower extremity strength, range of motion, stamina, balance, functional mobility, safety, adaptive techniques and equipment, NMR, activity tolerance, family ed.   Goals are: mod I. 8. OT will assess and treat for/with: ADL's, functional mobility, safety, upper extremity strength, adaptive techniques and equipment, NMR, community reintegration, ego support, family and patient ed.   Goals are: mod I. Therapy may proceed with showering this  patient. 9. SLP will assess and treat for/with: swallowing, speech, higher level cognitive assessment.  Goals are: mod I. 10. Case Management and Social Worker will assess and treat for psychological issues and discharge planning. 11. Team conference will be held weekly to assess progress toward goals and to determine barriers to discharge. 12. Patient will receive at least 3 hours of therapy per day at least 5 days per week. 13. ELOS: 7-10 days       14. Prognosis:  excellent     Meredith Staggers, MD, [place] [facility] 12/04/2015   12/04/2015

## 2015-12-04 NOTE — Progress Notes (Signed)
Patient Profile: 69 y.o. male w/ PMHx significant for rheumatoid arthritis and HTN who presented to [facility] on 11/11/2015 after an out-of-hospital cardiac arrest/ CODE STEMI.   Subjective: Mild chest discomfort with deep breath (CPR) No dyspnea. Diarrhea much improved.   Happy wife.  Objective: Vital signs in last 24 hours: Temp:  [97.7 F (36.5 C)-98.4 F (36.9 C)] 98.3 F (36.8 C) (12/22 1012) Pulse Rate:  [93-109] 109 (12/22 1012) Resp:  [17-18] 18 (12/22 1012) BP: (103-112)/(55-67) 112/61 mmHg (12/22 1012) SpO2:  [97 %-100 %] 100 % (12/22 1012) Weight:  [179 lb 8 oz (81.421 kg)] 179 lb 8 oz (81.421 kg) (12/22 0525) Last BM Date: 12/04/15  Intake/Output from previous day: 12/21 0701 - 12/22 0700 In: 1140 [P.O.:840; IV Piggyback:300] Out: 575 [Urine:575] Intake/Output this shift:    Medications Current Facility-Administered Medications  Medication Dose Route Frequency Provider Last Rate Last Dose  . 0.9 %  sodium chloride infusion   Intravenous Continuous Praveen Mannam, MD 10 mL/hr at 12/02/15 1637    . acetaminophen (TYLENOL) tablet 650 mg  650 mg Oral Q4H PRN Sherren Mocha, MD   650 mg at 12/03/15 1404  . aspirin chewable tablet 81 mg  81 mg Oral Daily Sherren Mocha, MD   81 mg at 12/04/15 1014  . atorvastatin (LIPITOR) tablet 80 mg  80 mg Oral q1800 Leonie Man, MD   80 mg at 12/03/15 1746  . enoxaparin (LOVENOX) injection 40 mg  40 mg Subcutaneous Q24H Darnell Level Mancheril, RPH   40 mg at 12/03/15 1355  . famotidine (PEPCID) 40 MG/5ML suspension 20 mg  20 mg Oral BID Lyndee Leo, RPH   20 mg at 12/04/15 1014  . feeding supplement (ENSURE ENLIVE) (ENSURE ENLIVE) liquid 237 mL  237 mL Oral BID BM Asencion Islam, RD   Stopped at 12/03/15 1030  . fluticasone (FLONASE) 50 MCG/ACT nasal spray 2 spray  2 spray Each Nare Daily Rhonda G Barrett, PA-C   2 spray at 12/04/15 1015  . Gerhardt's butt cream   Topical PRN Rush Farmer, MD      .  guaiFENesin (ROBITUSSIN) 100 MG/5ML solution 300 mg  15 mL Per Tube 6 times per day Erick Colace, NP   300 mg at 12/04/15 0824  . loratadine (CLARITIN) tablet 10 mg  10 mg Oral Daily Jerline Pain, MD   10 mg at 12/04/15 1015  . metoprolol tartrate (LOPRESSOR) tablet 12.5 mg  12.5 mg Oral BID Jerline Pain, MD   12.5 mg at 12/04/15 1014  . metroNIDAZOLE (FLAGYL) IVPB 500 mg  500 mg Intravenous Q8H Rush Farmer, MD   500 mg at 12/04/15 0503  . ondansetron (ZOFRAN) injection 4 mg  4 mg Intravenous Q6H PRN Sherren Mocha, MD   4 mg at 11/30/15 0930  . oxyCODONE-acetaminophen (PERCOCET/ROXICET) 5-325 MG per tablet 1 tablet  1 tablet Oral Q6H PRN Consuelo Pandy, PA-C   1 tablet at 12/04/15 [id number]  . sodium chloride (OCEAN) 0.65 % nasal spray 1 spray  1 spray Each Nare PRN Consuelo Pandy, PA-C   1 spray at 12/04/15 0138  . tamsulosin (FLOMAX) capsule 0.4 mg  0.4 mg Oral Daily Rhonda G Barrett, PA-C   0.4 mg at 12/04/15 1014  . ticagrelor (BRILINTA) tablet 90 mg  90 mg Oral BID Sherren Mocha, MD   90 mg at 12/04/15 1014  . traZODone (DESYREL) tablet 50 mg  50 mg  Oral QHS Collene Gobble, MD   50 mg at 12/03/15 2101  . vancomycin (VANCOCIN) 50 mg/mL oral solution 500 mg  500 mg Oral 4 times per day Eileen Stanford, PA-C   500 mg at 12/04/15 0503    PE: General appearance: alert, cooperative and no distress Neck: no carotid bruit and no JVD Lungs: clear to auscultation bilaterally Heart: regular rate and rhythm, S1, S2 normal, no murmur, click, rub or gallop Extremities: no LEE Pulses: 2+ and symmetric Skin: warm and dry Neurologic: Grossly normal  Lab Results:  No results for input(s): WBC, HGB, HCT, PLT in the last 72 hours. BMET  Recent Labs  12/03/15 0510  NA 144  K 3.8  CL 115*  CO2 20*  GLUCOSE 199*  BUN 15  CREATININE 0.85  CALCIUM 8.1*     Assessment/Plan  Principal Problem:   Cardiac arrest with ventricular fibrillation ([name]) Active Problems:   Rheumatoid  arthritis ([name])   ST elevation (STEMI) myocardial infarction involving left anterior descending coronary artery ([name])   Acute respiratory failure with hypoxia ([name])   Shock circulatory ([name])   Coma ([name])   CAD S/P PCI OF pLAD with 3.5 mm x38 mm Promus DES (3.75 mm)   Cardiomyopathy, ischemic - EF 20-25% by LV Gram   Acute delirium   Hypokalemia   Cough   HCAP (healthcare-associated pneumonia)   Diarrhea of presumed infectious origin   Difficult intravenous access   Dysphagia   Tachypnea   Hypernatremia   Leukocytosis   Absolute anemia   Thrombocytosis ([name])   1. Ventricular fibrillation arrest  - Alert, good recovery - Doing well on floor - Overall improved.    2. STEMI  - Total occlusion of proximal LAD - s/p DES - Non obstructive Circ and RCA - EF was 25% but improved to 50% on ECHO - Continue ASA and Brilinta, statin and BB - BP soft, no room for increase Bb at this time.   Acute systolic CHF  - EF initially 25% on LV-gram, improved to 50-55% after intervention.  - BP is slightly better.  - started beta blocker, metoprolol 12.5 BID  HCAP - Per PCCM. Finished aztreoman.  - They have signed off.   C diff - diarrhea improved.  - Continuing Vanc PO and Flagyl IV. Day number 11.  - Per Dr. Lamonte Sakai continue IV Flagyl while here in hospital and when [place], continue PO Vanc for a total of 2 weeks. (Does not need IV Flagyl at rehab in other words).  - He stated no need for repeat C diff assay unless diarrhea returns.   Rheumatoid arthritis - Currently holding methotrexate. Recommend discussing with rheumatologist at home on when to restart. - Avoid Mobic, NSAID  Hypernatremia - level improved and now WNL at 144  Disposition - Comfortable with inpatient rehabilitation DC today    LOS: 23 days    Candee Furbish, MD

## 2015-12-04 NOTE — Progress Notes (Signed)
Retta Diones, RN Rehab Admission Coordinator Signed Physical Medicine and Rehabilitation PMR Pre-admission 12/03/2015 2:22 PM  Related encounter: ED to Hosp-Admission (Current) from 11/11/2015 in [place] Collapse All   PMR Admission Coordinator Pre-Admission Assessment  Patient: [name] is an 69 y.o., male MRN: [id number] DOB: Jul 09, 1946 Height: '5\' 7"'  (170.2 cm) Weight: 81.421 kg (179 lb 8 oz)  Insurance Information HMO: PPO: Yes PCP: IPA: 80/20: OTHER: [place] # [id number] PRIMARY: [place] Policy#: [id number] Subscriber: [name] CM Name: [name] Phone#: [phone] Fax#: [phone] Pre-Cert#: [id number] approved for 8 days with update due 12/09/15 to [phone] telephonic Employer: Wife works FT Benefits: Phone #: [phone] Name: [name]. Date: 12/13/13 Deduct: $1000 (met $1000) Out of Pocket Max: $3000 (met $3000) Life Max: None CIR: 90% w/auth SNF: 90% w/auth with 120 days max Outpatient: 30 visits per year Co-Pay: $30/visit Home Health: 90% with 60 visit limit Co-Pay: 10% DME: 90% Co-Pay: 10% Providers: in network  SECONDARY: Medicare A/B Policy#: [id number] Subscriber: [name] CM Name: Phone#: Fax#:  Pre-Cert#: Employer: Parttime at [employer] Benefits: Phone #: Name: Checked in [place]. Date: 01/14/12 Deduct: $1288 Out of Pocket Max: none Life Max: unlimited CIR: 100% SNF: 100 days Outpatient: 80% Co-Pay: 20% Home Health: 100% Co-Pay: none DME: 80% Co-Pay: 20%  Emergency Contact Information Contact Information    Name Relation Home Work Mobile    [place],[name]   [phone]     Current Medical History  Patient Admitting Diagnosis: Debility after cardiac arrest  History of Present Illness: A 69 y.o. male with history of RA, impaired glucose tolerance, HTN who was admitted to [facility] on 11/11/15 with 2 week history of intermittent CP and witnessed cardiac arrest. Family initiated CPR, he was treated with ACLS protocol and EKG in field showed evidence of NSTEMI. He was unresponsive and intubated in ED and taken to cath lab emergently. Cardiac cath revealed occlusive LAD lesion which was treated with PCI and DES by Dr. Burt Knack and was treated with hypothermia protocol. Hospital course significant for respiratory failure due to HCAP as well as laryngeal edema post extubation 12/08 requiring reintubation on 12/13 due to increase in WOB and lethargy. Diarrhea due to c diff colitis treated with vanc and flagyl with improvement in leucocytosis. He tolerated extubation on 12/15 and was started on dysphagia 2, nectar liquids. Repeat swallow study done today and patient advanced to dysphagia 3, thin liquids. Patient with resultant weakness with unsteady gait and inability to complete ADL tasks. CIR recommended for follow up therapy.    Past Medical History  Past Medical History  Diagnosis Date  . Rheumatoid arthritis(714.0) 1998  . Nephrolithiasis 1968  . Insomnia   . Allergic rhinitis   . Impaired glucose tolerance   . Hypertension   . ST elevation (STEMI) myocardial infarction involving left anterior descending coronary artery ([place]) 11/11/2015    100% LAD, Cardiogenic Shock. -- EF ~20-25%  . Cardiac arrest with ventricular fibrillation ([place]) 11/11/2015  . CAD S/P PCI OF pLAD with 3.5 mm x38 mm Promus DES (3.75 mm) 11/12/2015  . Cardiomyopathy, ischemic - EF 20-25% by LV Gram 11/12/2015    There is severe left ventricular systolic dysfunction. The left ventricular ejection fraction is  25-35% by visual estimate. There are wall motion abnormalities in the left ventricle. There are segmental wall motion abnormalities in the left ventricle. The anterolateral and periapical LV walls are akinetic with an estimated LVEF  of 25%   . Pelvic fracture ([place]) 09/2015  . Fractured rib     Family History  family history includes Cancer in his mother; Diabetes in his father.  Prior Rehab/Hospitalizations: No previous rehab admissions.  Has the patient had major surgery during 100 days prior to admission? No  Current Medications   Current facility-administered medications:  . 0.9 % sodium chloride infusion, , Intravenous, Continuous, Praveen Mannam, MD, Last Rate: 10 mL/hr at 12/02/15 1637 . acetaminophen (TYLENOL) tablet 650 mg, 650 mg, Oral, Q4H PRN, Sherren Mocha, MD, 650 mg at 12/03/15 1404 . aspirin chewable tablet 81 mg, 81 mg, Oral, Daily, Sherren Mocha, MD, 81 mg at 12/03/15 0953 . atorvastatin (LIPITOR) tablet 80 mg, 80 mg, Oral, q1800, Leonie Man, MD, 80 mg at 12/03/15 1746 . enoxaparin (LOVENOX) injection 40 mg, 40 mg, Subcutaneous, Q24H, Darnell Level Mancheril, RPH, 40 mg at 12/03/15 1355 . famotidine (PEPCID) 40 MG/5ML suspension 20 mg, 20 mg, Oral, BID, Lyndee Leo, RPH, 20 mg at 12/03/15 2128 . feeding supplement (ENSURE ENLIVE) (ENSURE ENLIVE) liquid 237 mL, 237 mL, Oral, BID BM, Asencion Islam, RD, Stopped at 12/03/15 1030 . fluticasone (FLONASE) 50 MCG/ACT nasal spray 2 spray, 2 spray, Each Nare, Daily, Rhonda G Barrett, PA-C, 2 spray at 12/03/15 0954 . Gerhardt's butt cream, , Topical, PRN, Rush Farmer, MD . guaiFENesin (ROBITUSSIN) 100 MG/5ML solution 300 mg, 15 mL, Per Tube, 6 times per day, Erick Colace, NP, 300 mg at 12/04/15 0824 . loratadine (CLARITIN) tablet 10 mg, 10 mg, Oral, Daily, Jerline Pain, MD, 10 mg at 12/03/15 0177 . metoprolol tartrate (LOPRESSOR) tablet 12.5 mg, 12.5 mg, Oral, BID, Jerline Pain, MD, 12.5 mg at  12/03/15 2101 . metroNIDAZOLE (FLAGYL) IVPB 500 mg, 500 mg, Intravenous, Q8H, Rush Farmer, MD, 500 mg at 12/04/15 0503 . ondansetron (ZOFRAN) injection 4 mg, 4 mg, Intravenous, Q6H PRN, Sherren Mocha, MD, 4 mg at 11/30/15 0930 . oxyCODONE-acetaminophen (PERCOCET/ROXICET) 5-325 MG per tablet 1 tablet, 1 tablet, Oral, Q6H PRN, Consuelo Pandy, PA-C, 1 tablet at 12/04/15 [phone] . sodium chloride (OCEAN) 0.65 % nasal spray 1 spray, 1 spray, Each Nare, PRN, Consuelo Pandy, PA-C, 1 spray at 12/04/15 0138 . tamsulosin (FLOMAX) capsule 0.4 mg, 0.4 mg, Oral, Daily, Rhonda G Barrett, PA-C, 0.4 mg at 12/03/15 0953 . ticagrelor (BRILINTA) tablet 90 mg, 90 mg, Oral, BID, Sherren Mocha, MD, 90 mg at 12/03/15 2101 . traZODone (DESYREL) tablet 50 mg, 50 mg, Oral, QHS, Collene Gobble, MD, 50 mg at 12/03/15 2101 . vancomycin (VANCOCIN) 50 mg/mL oral solution 500 mg, 500 mg, Oral, 4 times per day, Eileen Stanford, PA-C, 500 mg at 12/04/15 0503  Patients Current Diet: DIET DYS 3 Room service appropriate?: Yes; Fluid consistency:: Thin  Precautions / Restrictions Precautions Precautions: Fall Restrictions Weight Bearing Restrictions: No   Has the patient had 2 or more falls or a fall with injury in the past year?No. However, he fell off ladder 04/08/15 with multiple injuries.  Prior Activity Level Community (5-7x/wk): Went out daily. Worked PT 4 days a week 4 hours a day at [employer] in [employer]. He mowed his yard and was active outside.  Home Assistive Devices / Equipment Home Assistive Devices/Equipment: None Home Equipment: Walker - 4 wheels  Prior Device Use: Indicate devices/aids used by the patient prior to current illness, exacerbation or injury? None. However, in May he used a walker and then progressed to cane, then off cane  after June 2016.  Prior Functional Level Prior Function Level of Independence: Independent Comments: did part time work for [employer], wife can  work from home  [place]: Did the patient need help bathing, dressing, using the toilet or eating? Independent  Indoor Mobility: Did the patient need assistance with walking from room to room (with or without device)? Independent  Stairs: Did the patient need assistance with internal or external stairs (with or without device)? Independent  Functional Cognition: Did the patient need help planning regular tasks such as shopping or remembering to take medications? Independent  Current Functional Level Cognition  Overall Cognitive Status: No family/caregiver present to determine baseline cognitive functioning Orientation Level: Oriented X4 Following Commands: Follows one step commands inconsistently, Follows one step commands with increased time Safety/Judgement: Decreased awareness of safety, Decreased awareness of deficits General Comments: very slow to respond to mobility commands as if planning issue, but pt also globally weak; stool incontinence continues with poor awareness. Poor awareness of deficits by pt.    Extremity Assessment (includes Sensation/Coordination)  Upper Extremity Assessment: Generalized weakness RUE Deficits / Details: AROM grossly WFL, strength shoulder flexion 3+/5, elbow flexion 4-/5 grip 4-/5 LUE Deficits / Details: AROM grossly WFL, strength shoulder flexion 3-/5, elbow flexion 4-/5 grip 4-/5  Lower Extremity Assessment: Defer to PT evaluation RLE Deficits / Details: grossly 3/5 LLE Deficits / Details: grossly 3/5    ADLs  Overall ADL's : Needs assistance/impaired Upper Body Dressing : Set up, Sitting Lower Body Dressing: Min guard, Sit to/from stand Toilet Transfer: Minimal assistance, Stand-pivot, RW, BSC (Min guard for stand pivot/pivotal steps; Min guard-Min assist) Toileting- Water quality scientist and Hygiene: Min guard, Sit to/from stand Functional mobility during ADLs: Min guard, Rolling walker (stand pivot/pivotal steps)     Mobility  Overal bed mobility: Needs Assistance Bed Mobility: Rolling, Sit to Supine Rolling: Min assist Sidelying to sit: Max assist Sit to supine: Min assist, +2 for physical assistance General bed mobility comments: not assessed    Transfers  Overall transfer level: Needs assistance Equipment used: Rolling walker (2 wheeled) Transfers: Sit to/from Stand, [employer] Transfers Sit to Stand: Min assist (Min assist-Min guard) Stand pivot transfers: Min guard Squat pivot transfers: Max assist, Total assist, +2 physical assistance General transfer comment: Took pivotal steps to/from [facility].     Ambulation / Gait / Stairs / Wheelchair Mobility  Ambulation/Gait Ambulation/Gait assistance: Mod assist, +2 physical assistance Ambulation Distance (Feet): 12 Feet Assistive device: Rolling walker (2 wheeled) Gait Pattern/deviations: Step-through pattern, Decreased stride length, Trunk flexed, Staggering left, Staggering right, Leaning posteriorly General Gait Details: Pt began ambulation and needed cues and assist to sequence steps and to steer rW. At one point, pts right hand slipped off RW and pt needed mod assist to recover balance. Pt generally unsteady with several LOB during ambulation in which pt needed assist to recover. Pt lacks safety awareness to self correct.  Gait velocity interpretation: Below normal speed for age/gender    Posture / Balance Dynamic Sitting Balance Sitting balance - Comments: worked on balance at EOB x about 10 minutes cues, placement, trunk rotation and facilitation for head, neck, shoulder and core activation. Has strength, but cannot maintain more than a few seconds with head righted, able to rotate trunk and demonstrated improved balance momentarily afterwards. Balance Overall balance assessment: Needs assistance, History of Falls Sitting-balance support: No upper extremity supported, Feet supported Sitting balance-Leahy Scale: Fair Sitting  balance - Comments: worked on balance at EOB x about 10 minutes cues,  placement, trunk rotation and facilitation for head, neck, shoulder and core activation. Has strength, but cannot maintain more than a few seconds with head righted, able to rotate trunk and demonstrated improved balance momentarily afterwards. Standing balance support: Bilateral upper extremity supported, During functional activity Standing balance-Leahy Scale: Poor Standing balance comment: Relies on UE support and cuing as well as assist to maintain static standing.  High level balance activites: Direction changes, Turns High Level Balance Comments: mod assist with above challenges to balance.     Special needs/care consideration BiPAP/CPAP No CPM No Continuous Drip IV KVO Dialysis No  Life Vest No Oxygen No Special Bed No Trach Size No Wound Vac (area) No  Skin Has reddened buttocks area, back has hives per his wife  Bowel mgmt: Last BM 12/03/15 - diarrhea, C-Diff precautions Bladder mgmt: Voiding in urinal Diabetic mgmt No Enteric Precautions - C-Diff, on antibiotics    Previous Home Environment Living Arrangements: Poole/significant other Available Help at Discharge: Friend(s), Available PRN/intermittently (wife works form home but she is upstairs and pt down) Type of Home: House Home Layout: Two level, Able to live on main level with bedroom/bathroom Home Access: Stairs to enter Entrance Stairs-Rails: Right Entrance Stairs-Number of Steps: 4 Bathroom Shower/Tub: Chiropodist: Standard Home Care Services: No Additional Comments: Wife states in April when pt injured, pt stayed on first level and borrowed a 3N1 and slept in recliner. He was able to get to his 3N1 without her help. She works upstairs in her office and cannot provide 24/7 care to him.   Discharge Living Setting Plans for Discharge Living Setting: Patient's home, House,  Lives with (comment) Type of Home at Discharge: House Discharge Home Layout: Two level, Full bath on main level, Bed/bath upstairs (Could stay in recliner on main level.) Alternate Level Stairs-Number of Steps: 15 steps Discharge Home Access: Stairs to enter Entrance Stairs-Number of Steps: 5 Does the patient have any problems obtaining your medications?: No  Social/Family/Support Systems Patient Roles: Poole, Parent (Has a wife and a daughter.) Contact Information: [name] - wife Anticipated Caregiver: wife Anticipated Caregiver's Contact Information: [name] - wife [phone] Ability/Limitations of Caregiver: Wife works days 7:30 am to 6:30 pm Caregiver Availability: Intermittent Discharge Plan Discussed with Primary Caregiver: Yes Is Caregiver In Agreement with Plan?: Yes Does Caregiver/Family have Issues with Lodging/Transportation while Pt is in Rehab?: No  Goals/Additional Needs Patient/Family Goal for Rehab: PT/OT mod I and supervision, ST mod I and independent goals Expected length of stay: 13-16 days Cultural Considerations: None Dietary Needs: Dys 3, thin liquids Equipment Needs: TBD Pt/Family Agrees to Admission and willing to participate: Yes Program Orientation Provided & Reviewed with Pt/Caregiver Including Roles & Responsibilities: Yes  Decrease burden of Care through IP rehab admission: N/A  Possible need for SNF placement upon discharge: Not planned  Patient Condition: This patient's medical and functional status has changed since the consult dated: 12/01/15 in which the Rehabilitation Physician determined and documented that the patient's condition is appropriate for intensive rehabilitative care in an inpatient rehabilitation facility. See "History of Present Illness" (above) for medical update. Functional changes are: Currently requiring mod assist to ambulate 12' RW. Patient's medical and functional status update has been discussed with the  Rehabilitation physician and patient remains appropriate for inpatient rehabilitation. Will admit to inpatient rehab today.  Preadmission Screen Completed By: Retta Diones, 12/04/2015 9:06 AM ______________________________________________________________________  Discussed status with Dr. Naaman Plummer on 12/04/15 at 0907 and received telephone approval for admission today.  Admission Coordinator: Retta Diones, time0907/Date12/22/16          Cosigned by: Meredith Staggers, MD at 12/04/2015 9:29 AM  Revision History     Date/Time User Provider Type Action   12/04/2015 9:29 AM Meredith Staggers, MD Physician Cosign   12/04/2015 9:22 AM Retta Diones, RN Rehab Admission Coordinator Sign   12/04/2015 9:07 AM Retta Diones, RN Rehab Admission Coordinator Sign   View Details Report

## 2015-12-04 NOTE — Interval H&P Note (Signed)
[name] was admitted today to Inpatient Rehabilitation with the diagnosis of debility/?encephalopathy after cardiac arrest.  The patient's history has been reviewed, patient examined, and there is no change in status.  Patient continues to be appropriate for intensive inpatient rehabilitation.  I have reviewed the patient's chart and labs.  Questions were answered to the patient's satisfaction. The PAPE has been reviewed and assessment remains appropriate.  Stephani Janak T 12/04/2015, 6:34 PM

## 2015-12-04 NOTE — Progress Notes (Signed)
CSW aware that patient has been accepted by CIR. Message left for Dorene Sorrow- Admissions at [facility] to stop auth request for SNF.  No further CSW needs identified. CSW signing off.  Lorie Phenix. Pauline Good, [place]

## 2015-12-04 NOTE — Discharge Instructions (Signed)
Acute Coronary Syndrome  Acute coronary syndrome (ACS) is a serious problem in which there is suddenly not enough blood and oxygen supplied to the heart. ACS may mean that one or more of the blood vessels in your heart (coronary arteries) may be blocked. ACS can result in chest pain or a heart attack (myocardial infarction or MI).  CAUSES  This condition is caused by atherosclerosis, which is the buildup of fat and cholesterol (plaque) on the inside of the arteries. Over time, the plaque may narrow or block the artery, and this will lessen blood flow to the heart. Plaque can also become weak and break off within a coronary artery to form a clot and cause a sudden blockage.  RISK FACTORS  The risks factors of this condition include:   High cholesterol levels.   High blood pressure (hypertension).   Smoking.   Diabetes.   Age.   Family history of chest pain, heart disease, or stroke.   Lack of exercise.  SYMPTOMS  The most common signs of this condition include:   Chest pain, which can be:    A crushing or squeezing in the chest.    A tightness, pressure, fullness, or heaviness in the chest.    Present for more than a few minutes, or it can stop and recur.   Pain in the arms, neck, jaw, or back.   Unexplained heartburn or indigestion.   Shortness of breath.   Nausea.   Sudden cold sweats.   Feeling light-headed or dizzy.  Sometimes, this condition has no symptoms.  DIAGNOSIS  ACS may be diagnosed through the following tests:   Electrocardiogram (ECG).   Blood tests.   Coronary angiogram. This is a procedure to look at the coronary arteries to see if there is any blockage.  TREATMENT  Treatment for ACS may include:   Healthy behavioral changes to reduce or control risk factors.   Medicine.   Coronary stenting.A stent helps to keep an artery open.   Coronary angioplasty. This procedure widens a narrowed or blocked artery.   Coronary artery bypass surgery. This will allow your blood to pass the  blockage (bypass) to reach your heart.  HOME CARE INSTRUCTIONS  Eating and Drinking   Follow a heart-healthy diet. A dietitian can you help to educate you about healthy food options and changes.   Use healthy cooking methods such as roasting, grilling, broiling, baking, poaching, steaming, or stir-frying. Talk to a dietitian to learn more about healthy cooking methods.  Medicines   Take medicines only as directed by your health care provider.   Do not take the following medicines unless your health care provider approves:    Nonsteroidal anti-inflammatory drugs (NSAIDs), such as ibuprofen, naproxen, or celecoxib.    Vitamin supplements that contain vitamin A, vitamin E, or both.    Hormone replacement therapy that contains estrogen with or without progestin.   Stop illegal drug use.  Activities   Follow an exercise program that is approved by your health care provider.   Plan rest periods when you are fatigued.  Lifestyle   Do not use any tobacco products, including cigarettes, chewing tobacco, or electronic cigarettes. If you need help quitting, ask your health care provider.   If you drink alcohol, and your health care provider approves, limit your alcohol intake to no more than 1 drink per day. One drink equals 12 ounces of beer, 5 ounces of wine, or 1 ounces of hard liquor.   Learn to manage   stress.   Maintain a healthy weight. Lose weight as approved by your health care provider.  General Instructions   Manage other health conditions, such as hypertension and diabetes, as directed by your health care provider.   Keep all follow-up visits as directed by your health care provider. This is important.   Your health care provider may ask you to monitor your blood pressure. A blood pressure reading consists of a higher number over a lower number, such as 110 over 72, written as 110/72. Ideally, your blood pressure should be:    Below 140/90 if you have no other medical conditions.    Below 130/80 if  you have diabetes or kidney disease.  SEEK IMMEDIATE MEDICAL CARE IF:   You have pain in your chest, neck, arm, jaw, stomach, or back that lasts more than a few minutes, is recurring, or is not relieved by taking medicine under your tongue (sublingual nitroglycerin).   You have profuse sweating without cause.   You have unexplained:    Heartburn or indigestion.    Shortness of breath or difficulty breathing.    Nausea or vomiting.    Fatigue.    Feelings of nervousness or anxiety.    Weakness.    Diarrhea.   You have sudden light-headedness or dizziness.   You faint.  These symptoms may represent a serious problem that is an emergency. Do not wait to see if the symptoms will go away. Get medical help right away. Call your local emergency services (911 in the [place] Do not drive yourself to the clinic or hospital.     This information is not intended to replace advice given to you by your health care provider. Make sure you discuss any questions you have with your health care provider.     Document Released: 11/29/2005 Document Revised: 12/20/2014 Document Reviewed: 04/02/2014  Elsevier Interactive Patient Education 2016 [employer]

## 2015-12-04 NOTE — Interval H&P Note (Signed)
[name] was admitted today to Inpatient Rehabilitation with the diagnosis of debility, ?anoxic encephalopathy.  The patient's history has been reviewed, patient examined, and there is no change in status.  Patient continues to be appropriate for intensive inpatient rehabilitation.  I have reviewed the patient's chart and labs.  Questions were answered to the patient's satisfaction. The PAPE has been reviewed and assessment remains appropriate.  Bricen Victory T 12/04/2015, 6:36 PM

## 2015-12-04 NOTE — Discharge Summary (Signed)
Discharge Summary   Patient ID: [name],  MRN: [id number], DOB/AGE: 03-08-1946 69 y.o.  Admit date: 11/11/2015 Discharge date: 12/04/2015  Primary Care Provider: [name] Primary Cardiologist: new - Dr. Burt Knack  Discharge Diagnoses Principal Problem:   Cardiac arrest with ventricular fibrillation [facility]) Active Problems:   Rheumatoid arthritis ([place])   ST elevation (STEMI) myocardial infarction involving left anterior descending coronary artery ([place])   Acute respiratory failure with hypoxia ([place])   Shock circulatory ([place])   Coma ([place])   CAD S/P PCI OF pLAD with 3.5 mm x38 mm Promus DES (3.75 mm)   Cardiomyopathy, ischemic - EF 20-25% by LV Gram   Acute delirium   Hypokalemia   Cough   HCAP (healthcare-associated pneumonia)   Diarrhea of presumed infectious origin   Difficult intravenous access   Dysphagia   Tachypnea   Hypernatremia   Leukocytosis   Absolute anemia   Thrombocytosis ([name])   Allergies Allergies  Allergen Reactions  . Contrast Media [Iodinated Diagnostic Agents] Hives    03/28/14 pt received 1 hour emergent prep and pt did good and had no complaints after scan.  . Ibuprofen     REACTION: unspecified  . Penicillins     REACTION: unspecified  . Sulfamethoxazole     REACTION: unspecified    Procedures  Echocardiogram 11/14/2015 LV EF: 50% -  55%  ------------------------------------------------------------------- Indications:   Chest pain 786.51.  ------------------------------------------------------------------- History:  PMH:  Coronary artery disease. PMH:  Sudden death episode. Ventricular fibrillation. Risk factors: Former tobacco use. Hypertension.  ------------------------------------------------------------------- Study Conclusions  - Left ventricle: The cavity size was normal. Systolic function was normal. The estimated ejection fraction was in the range of 50% to 55%. There may be hypokinesis  of the distal anterior wall and anterior septum. The study is not technically sufficient to allow evaluation of LV diastolic function.    Cardiac catheterization 11/11/2015 Conclusion    1. Total occlusion of the proximal LAD, treated successfully with primary PCI (drug-eluting stent) 2. Nonobstructive LCx and RCA stenoses 3. Severe segmental LV systolic dysfunction, LVEF 25%  Recommendations: hypothermia protocol per CCM team, post-MI medical therapy and supportive care in CCU setting.       Hospital Course  The patient is a 69 year old male with PMH significant for rheumatoid arthritis and hypertension who presented to [facility] and 11/11/2015 for out of hospital cardiac arrest. He reportedly had chest and arm pain intermittently for 2 weeks after sustaining a mechanical fall. He had worsening chest discomfort and ultimately arrested at home witnessed by his wife who started CPR and called 911 immediately (per card note on 12/20). Majority of the history was obtained from EMS as no family was at bedside on initial presentation. He received 2 defibrillation via AED before achieved ROSC. Total down time less than 15 minutes. The rhythm was presumed V. Fib 2/2 anterior STEMI.   Patient was intubated and transferred to cath lab. Initial cardiac catheterization on 11/11/2015 showed a totally occluded proximal LAD treated successfully with DES, nonobstructive circumflex and RCA stenosis, EF 25%. Post cath, patient was placed on aspirin and Brilinta. Patient underwent therapeutic hypothermia per CCM. He initially required pressor support. EEG was performed on 11/30 which was abnormal due to profound suppression of background rhythm. This is likely medication effect, further prognostic value limited at this time as patient undergoing hypothermic protocol. Patient had agitation while on sedation. Hospital course complicated by acute respiratory failure secondary to acute systolic heart  failure. This was  treated with Lasix. Echocardiogram was obtained on 11/14/2015, at which point EF has improved to 50-55%, hypokinesis of distal anterior wall and anterior septum. He was extubated on 12/8, he did receive dexamethasone for suspected laryngeal edema. Given his LV dysfunction, carvedilol and ramipril was added. NG tube was continued after extubation as he failed his swallow study initially. He was also treated with antibiotic for possible aspiration pneumonia versus hospital acquired pneumonia.  Unfortunately, patient has increasing work of breathing on 12/13 along with lethargy requiring intubation. ABG showed hypoxemia and respiratory alkalosis. She was also tested positive for C. Difficile. Antibiotic was changed to cover C. Difficile. Beta blocker was discontinued as patient required pressor again. He was again extubated on 12/15 and transferred to the floor on 12/19. He was able to tolerate PO. For his C. difficile, the plan is to continue Vanc for 2 weeks. Patient was seen in the morning of 12/04/2015, at which time he will has been recovering well since his recent cardiac arrest. He has been evaluated by physical therapy and feel stable enough to be admitted to inpatient rehabilitation today from cardiology perspective. Low-dose beta blocker 12.5 mg twice a day metoprolol has been restarted. Per recommendation by the CCM, will finish a total course of 2 weeks by mouth, no need to repeat C. difficile assay unless diarrhea returns. We are currently holding methotrexate, it is recommended for the patient to discuss with rheumatologist at home on when to restart. Given the need for one to platelet therapy, it is recommended he avoid mobic or NSAID. I have discussed with him importance of compliance with DAPT and arranged 2 week TOC followup with cardiology at which he is likely discharged from inpatient rehab. If not, we can always reschedule  Note Vanc was started on 12/13, I have given him  enough for 5 days to finish total of 14 day course, end on 12/26   Discharge Vitals Blood pressure 107/66, pulse 94, temperature 98.2 F (36.8 C), temperature source Oral, resp. rate 18, height 5\' 7"  (1.702 m), weight 179 lb 8 oz (81.421 kg), SpO2 98 %.  Filed Weights   12/01/15 0301 12/03/15 0300 12/04/15 0525  Weight: 171 lb 1.2 oz (77.6 kg) 180 lb 3.2 oz (81.738 kg) 179 lb 8 oz (81.421 kg)    Labs  Basic Metabolic Panel  Recent Labs  12/03/15 0510  NA 144  K 3.8  CL 115*  CO2 20*  GLUCOSE 199*  BUN 15  CREATININE 0.85  CALCIUM 8.1*    Disposition  Pt is being discharged to Inpatient rehab today in good condition.  Follow-up Plans & Appointments      Follow-up Information    Please follow up.   Why:  Your methotrexate has been held during this admission, please discuss with your rheumatologist on when to start.      Follow up with Richardson Dopp, PA-C On 12/17/2015.   Specialties:  Physician Assistant, Radiology, Interventional Cardiology   Why:  12:10AM   Contact information:   [address] [place] [address] [phone]       Schedule an appointment as soon as possible for a visit with [name], MD.   Specialty:  Internal Medicine   Why:  As soon as he are discharged from inpatient rehab   Contact information:   [place] [address] [phone]       Discharge Medications    Medication List    STOP taking these  medications        hydrochlorothiazide 25 MG tablet  Commonly known as:  HYDRODIURIL     methotrexate 2.5 MG tablet  Commonly known as:  RHEUMATREX      TAKE these medications        aspirin 81 MG chewable tablet  Chew 1 tablet (81 mg total) by mouth daily.     atorvastatin 80 MG tablet  Commonly known as:  LIPITOR  Take 1 tablet (80 mg total) by mouth daily at 6 PM.     Cetirizine HCl 10 MG Caps  Take 10 mg by mouth daily.     Co Q 10 10 MG Caps  Take 10 mg by  mouth daily.     fexofenadine 30 MG tablet  Commonly known as:  ALLEGRA  Take 30 mg by mouth daily.     fluticasone 50 MCG/ACT nasal spray  Commonly known as:  FLONASE  USE 2 SPRAYS INTO NOSE DAILY     folic acid 1 MG tablet  Commonly known as:  FOLVITE  Take 1 mg by mouth daily.     HYDROcodone-acetaminophen 5-325 MG tablet  Commonly known as:  NORCO/VICODIN  Take 1-2 tablets by mouth every 6 (six) hours as needed for moderate pain.     meloxicam 7.5 MG tablet  Commonly known as:  MOBIC  Take 1 tablet (7.5 mg total) by mouth 2 (two) times daily.     metoprolol tartrate 25 MG tablet  Commonly known as:  LOPRESSOR  Take 0.5 tablets (12.5 mg total) by mouth 2 (two) times daily.     nitroGLYCERIN 0.4 MG SL tablet  Commonly known as:  NITROSTAT  Place 1 tablet (0.4 mg total) under the tongue every 5 (five) minutes as needed.     PRO-BIOTIC BLEND PO  Take 1 tablet by mouth daily.     saw palmetto 160 MG capsule  Take 320 mg by mouth daily.     [place] ARIA IV  Inject into the vein. Infusion every 8 weeks     tamsulosin 0.4 MG Caps capsule  Commonly known as:  FLOMAX  Take 1 capsule (0.4 mg total) by mouth daily.     ticagrelor 90 MG Tabs tablet  Commonly known as:  BRILINTA  Take 1 tablet (90 mg total) by mouth 2 (two) times daily.     traMADol 50 MG tablet  Commonly known as:  ULTRAM  Take 50 mg by mouth every 6 (six) hours as needed for moderate pain.     traZODone 100 MG tablet  Commonly known as:  DESYREL  Take 1 tablet (100 mg total) by mouth at bedtime.     vancomycin 50 mg/mL oral solution  Commonly known as:  VANCOCIN  Take 10 mLs (500 mg total) by mouth every 6 (six) hours.     Vitamin D-3 1000 UNITS Caps  Take 2,000 Units by mouth daily.         Duration of Discharge Encounter   Greater than 30 minutes including physician time.  Hilbert Corrigan PA-C Pager: [phone] 12/04/2015, 2:20 PM   Personally seen and examined. Agree with above.   Ventricular fibrillation arrest  - Alert, good recovery - Doing well on floor - Overall improved.     STEMI  - Total occlusion of proximal LAD - s/p DES - Non obstructive Circ and RCA - EF was 25% but improved to 50% on ECHO - Continue ASA and Brilinta, statin and BB - BP soft,  no room for increase Bb at this time.   Acute systolic CHF  - EF initially 25% on LV-gram, improved to 50-55% after intervention.  - BP is slightly better.  - started beta blocker, metoprolol 12.5 BID  HCAP - Per PCCM. Finished aztreoman.  - They have signed off.   C diff - diarrhea improved.  - Continuing Vanc PO and Flagyl IV. Day number 11.  - Per Dr. Lamonte Sakai continue IV Flagyl while here in hospital and when [place], continue PO Vanc for a total of 2 weeks. (Does not need IV Flagyl at rehab in other words).  - He stated no need for repeat C diff assay unless diarrhea returns.   Rheumatoid arthritis - Currently holding methotrexate. Recommend discussing with rheumatologist at home on when to restart. - Avoid Mobic, NSAID  Hypernatremia - level improved and now WNL at 144  Disposition - Comfortable with inpatient rehabilitation DC today    LOS: 23 days    Candee Furbish, MD

## 2015-12-04 NOTE — Care Management Note (Signed)
Case Management Note Previous CM note initiated by Donne Anon RN, CM  Patient Details  Name: [name] MRN: [id number] Date of Birth: 04/04/1946  Subjective/Objective:     Pt adm w stemi, vent               Action/Plan: lives w wife, pcp dr Cordelia Pen   Expected Discharge Date:   12/04/15               Expected Discharge Plan:  [place]  In-House Referral:  Clinical Social Work  Discharge planning Services  CM Consult  Post Acute Care Choice:  NA Choice offered to:  NA  DME Arranged:    DME Agency:     HH Arranged:  NA HH Agency:  NA  Status of Service:  Completed, signed off  Medicare Important Message Given:  Yes Date Medicare IM Given:    Medicare IM give by:    Date Additional Medicare IM Given:    Additional Medicare Important Message give by:     If discussed at [place] of Stay Meetings, dates discussed:    Additional Comments: ur review done, left pt 30day free brilinta card.  12/04/15- pt for d/c to CIR today, spoke with PA- who will be doing discharge order and summary - Genie with CIR has confirmed that pt has a Bed on CIR today.    12/03/15- pt ready for discharge- has auth for CIR but no beds today- will plan on SNF tomorrow if no beds available at [facility],  CSW following for SNF as backup.   Dawayne Patricia, RN 12/04/2015, 11:25 AM

## 2015-12-05 ENCOUNTER — Inpatient Hospital Stay (HOSPITAL_COMMUNITY): Payer: BLUE CROSS/BLUE SHIELD | Admitting: Occupational Therapy

## 2015-12-05 ENCOUNTER — Inpatient Hospital Stay (HOSPITAL_COMMUNITY): Payer: BLUE CROSS/BLUE SHIELD | Admitting: Physical Therapy

## 2015-12-05 ENCOUNTER — Inpatient Hospital Stay (HOSPITAL_COMMUNITY): Payer: BLUE CROSS/BLUE SHIELD | Admitting: Speech Pathology

## 2015-12-05 DIAGNOSIS — E46 Unspecified protein-calorie malnutrition: Secondary | ICD-10-CM | POA: Diagnosis present

## 2015-12-05 DIAGNOSIS — A047 Enterocolitis due to Clostridium difficile: Secondary | ICD-10-CM

## 2015-12-05 DIAGNOSIS — I10 Essential (primary) hypertension: Secondary | ICD-10-CM

## 2015-12-05 DIAGNOSIS — E8809 Other disorders of plasma-protein metabolism, not elsewhere classified: Secondary | ICD-10-CM | POA: Diagnosis present

## 2015-12-05 LAB — COMPREHENSIVE METABOLIC PANEL
ALT: 50 U/L (ref 17–63)
AST: 34 U/L (ref 15–41)
Albumin: 2.5 g/dL — ABNORMAL LOW (ref 3.5–5.0)
Alkaline Phosphatase: 108 U/L (ref 38–126)
Anion gap: 8 (ref 5–15)
BILIRUBIN TOTAL: 0.4 mg/dL (ref 0.3–1.2)
BUN: 10 mg/dL (ref 6–20)
CHLORIDE: 112 mmol/L — AB (ref 101–111)
CO2: 21 mmol/L — ABNORMAL LOW (ref 22–32)
CREATININE: 0.88 mg/dL (ref 0.61–1.24)
Calcium: 8.4 mg/dL — ABNORMAL LOW (ref 8.9–10.3)
Glucose, Bld: 170 mg/dL — ABNORMAL HIGH (ref 65–99)
POTASSIUM: 3.9 mmol/L (ref 3.5–5.1)
Sodium: 141 mmol/L (ref 135–145)
TOTAL PROTEIN: 6.3 g/dL — AB (ref 6.5–8.1)

## 2015-12-05 MED ORDER — METRONIDAZOLE 500 MG PO TABS
500.0000 mg | ORAL_TABLET | Freq: Three times a day (TID) | ORAL | Status: AC
Start: 2015-12-05 — End: 2015-12-09
  Administered 2015-12-05 – 2015-12-09 (×12): 500 mg via ORAL
  Filled 2015-12-05 (×11): qty 1

## 2015-12-05 MED ORDER — METRONIDAZOLE 500 MG PO TABS
500.0000 mg | ORAL_TABLET | Freq: Three times a day (TID) | ORAL | Status: DC
  Filled 2015-12-05: qty 1

## 2015-12-05 MED ORDER — BENEPROTEIN PO POWD
1.0000 | Freq: Three times a day (TID) | ORAL | Status: DC
  Administered 2015-12-05 – 2015-12-09 (×11): 6 g via ORAL
  Filled 2015-12-05: qty 227

## 2015-12-05 NOTE — Progress Notes (Signed)
Nutrition Education Note  RD consulted for nutrition education regarding a Heart Healthy diet.   Lipid Panel     Component Value Date/Time   TRIG 171* 11/24/2015 0319    RD provided "Low Sodium Nutrition Therapy" handout from the Academy of [employer].  Discouraged intake of processed foods and use of salt shaker. Encouraged fresh fruits and vegetables as well as whole grain sources of carbohydrates to maximize fiber intake. Pt states that it will be easy for him to stop adding salt to his food. He will aim to decrease his intake of sweets and he will continue to eat whole grains, fruits, and vegetables. Teach back method used.  Expect good compliance.  Body mass index is 25.57 kg/(m^2). Pt meets criteria for Overweight based on current BMI.  Current diet order is Dysphagia 3, patient is consuming approximately 85% of meals at this time. He reports good appetite. He states that Ensure gives him diarrhea and he would like to discontinue it.  Labs and medications reviewed. No further nutrition interventions warranted at this time. RD contact information provided. If additional nutrition issues arise, please re-consult RD.  Scarlette Ar RD, LDN Inpatient Clinical Dietitian Pager: [phone] After Hours Pager: [phone]

## 2015-12-05 NOTE — Progress Notes (Signed)
[facility] PHYSICAL MEDICINE & REHABILITATION     PROGRESS NOTE    Subjective/Complaints:  Patient seen this morning resting in bed. He states he did not sleep well in general in the hospital but overall does not have any complaints. He is ready to begin his day therapies  ROS: Denies CP, SOB, abd pain, N/V  Objective: Vital Signs: Blood pressure 116/56, pulse 113, temperature 98.1 F (36.7 C), temperature source Oral, resp. rate 18, height 5\' 10"  (1.778 m), weight 80.831 kg (178 lb 3.2 oz), SpO2 100 %. Dg Swallowing Func-speech Pathology  12/03/2015  Objective Swallowing Evaluation:   Patient Details Name: [name] MRN: [id number] Date of Birth: [birth year] Today's Date: 12/03/2015 Time: SLP Start Time (ACUTE ONLY): 1218-SLP Stop Time (ACUTE ONLY): 1231 SLP Time Calculation (min) (ACUTE ONLY): 13 min Past Medical History: Past Medical History Diagnosis Date . Rheumatoid arthritis(714.0) 1998 . Nephrolithiasis 1968 . Insomnia  . Allergic rhinitis  . Impaired glucose tolerance  . Hypertension  . ST elevation (STEMI) myocardial infarction involving left anterior descending coronary artery ([place]) 11/11/2015   100% LAD, Cardiogenic Shock. -- EF ~20-25% . Cardiac arrest with ventricular fibrillation ([place]) 11/11/2015 . CAD S/P PCI OF pLAD with 3.5 mm x38 mm Promus DES (3.75 mm) 11/12/2015 . Cardiomyopathy, ischemic - EF 20-25% by LV Gram 11/12/2015   There is severe left ventricular systolic dysfunction. The left ventricular ejection fraction is 25-35% by visual estimate. There are wall motion abnormalities in the left ventricle. There are segmental wall motion abnormalities in the left ventricle. The anterolateral and periapical LV walls are akinetic with an estimated LVEF of 25%  . Pelvic fracture ([place]) 09/2015 . Fractured rib  Past Surgical History: Past Surgical History Procedure Laterality Date . Status post multiple lithotripy and urological surgery for reccurent calcium oxalate stones   .  Cardiac catheterization N/A 11/11/2015   Procedure: Left Heart Cath and Cors/Grafts Angiography;  Surgeon: Sherren Mocha, MD;  Location: [facility];  Service: Cardiovascular;  Laterality: N/A; . Cardiac catheterization N/A 11/11/2015   Procedure: Coronary Stent Intervention;  Surgeon: Sherren Mocha, MD;  Location: [facility];  Service: Cardiovascular;  Laterality: N/A;  prox lad 3.5x38 promus HPI: 69 year old male with PMH of RA and HTN. He presented to [facility] ED 11/29 after a witnessed cardiac arrest at home. Family initiated CPR. EMS arrived and AED was placed. Two shocks were delivered until ROSC was achieved. Total down time estimated at less than 15 minutes. Pt was intubated 11/29-12/8, and required reintubation 12/13-12/15. Subjective: alert, pleasant, wants coffee and graham crackers Assessment / Plan / Recommendation CHL IP CLINICAL IMPRESSIONS 12/03/2015 Therapy Diagnosis Mild pharyngeal phase dysphagia Clinical Impression Pt shows progress as expected from previous MBS, although a mild pharyngeal residue persists due to continued pharyngeal weakness. Small amounts of residue remain in the vallecular space with all consistencies, increasing with more solid textures. Cues for a second swallow facilitate clearance. No airway penetration is observed. Recommend to advance diet to mechanical soft textures and thin liquids. SLP to continue to follow. Impact on safety and function Mild aspiration risk   CHL IP TREATMENT RECOMMENDATION 12/03/2015 Treatment Recommendations Therapy as outlined in treatment plan below   Prognosis 12/03/2015 Prognosis for Safe Diet Advancement Good Barriers to Reach Goals -- Barriers/Prognosis Comment -- CHL IP DIET RECOMMENDATION 12/03/2015 SLP Diet Recommendations Dysphagia 3 (Mech soft) solids;Thin liquid Liquid Administration via Cup;Straw Medication Administration Whole meds with puree Compensations Slow rate;Small sips/bites;Multiple dry swallows  after each  bite/sip;Minimize environmental distractions Postural Changes Seated upright at 90 degrees   CHL IP OTHER RECOMMENDATIONS 12/03/2015 Recommended Consults -- Oral Care Recommendations Oral care BID Other Recommendations --   CHL IP FOLLOW UP RECOMMENDATIONS 12/03/2015 Follow up Recommendations Inpatient Rehab;24 hour supervision/assistance   CHL IP FREQUENCY AND DURATION 12/03/2015 Speech Therapy Frequency (ACUTE ONLY) min 2x/week Treatment Duration 2 weeks      CHL IP ORAL PHASE 12/03/2015 Oral Phase WFL Oral - Pudding Teaspoon -- Oral - Pudding Cup -- Oral - Honey Teaspoon -- Oral - Honey Cup -- Oral - Nectar Teaspoon -- Oral - Nectar Cup -- Oral - Nectar Straw -- Oral - Thin Teaspoon -- Oral - Thin Cup -- Oral - Thin Straw -- Oral - Puree -- Oral - Mech Soft -- Oral - Regular -- Oral - Multi-Consistency -- Oral - Pill -- Oral Phase - Comment --  CHL IP PHARYNGEAL PHASE 12/03/2015 Pharyngeal Phase Impaired Pharyngeal- Pudding Teaspoon -- Pharyngeal -- Pharyngeal- Pudding Cup -- Pharyngeal -- Pharyngeal- Honey Teaspoon -- Pharyngeal -- Pharyngeal- Honey Cup -- Pharyngeal -- Pharyngeal- Nectar Teaspoon NT Pharyngeal -- Pharyngeal- Nectar Cup NT Pharyngeal -- Pharyngeal- Nectar Straw NT Pharyngeal -- Pharyngeal- Thin Teaspoon -- Pharyngeal -- Pharyngeal- Thin Cup Delayed swallow initiation-vallecula;Reduced tongue base retraction;Pharyngeal residue - valleculae Pharyngeal -- Pharyngeal- Thin Straw Delayed swallow initiation-vallecula;Reduced tongue base retraction;Pharyngeal residue - valleculae Pharyngeal -- Pharyngeal- Puree Delayed swallow initiation-vallecula;Reduced tongue base retraction;Pharyngeal residue - valleculae Pharyngeal -- Pharyngeal- Mechanical Soft Delayed swallow initiation-vallecula;Reduced tongue base retraction;Pharyngeal residue - valleculae Pharyngeal -- Pharyngeal- Regular -- Pharyngeal -- Pharyngeal- Multi-consistency -- Pharyngeal -- Pharyngeal- Pill -- Pharyngeal -- Pharyngeal Comment --   CHL IP CERVICAL ESOPHAGEAL PHASE 12/03/2015 Cervical Esophageal Phase WFL Pudding Teaspoon -- Pudding Cup -- Honey Teaspoon -- Honey Cup -- Nectar Teaspoon -- Nectar Cup -- Nectar Straw -- Thin Teaspoon -- Thin Cup -- Thin Straw -- Puree -- Mechanical Soft -- Regular -- Multi-consistency -- Pill -- Cervical Esophageal Comment -- [name], M.A. CCC-SLP [phone] [name] 12/03/2015, 2:36 PM               Recent Labs  12/04/15 2013  WBC 6.1  HGB 12.4*  HCT 37.7*  PLT 275    Recent Labs  12/03/15 0510 12/05/15 0756  NA 144 141  K 3.8 3.9  CL 115* 112*  GLUCOSE 199* 170*  BUN 15 10  CREATININE 0.85 0.88  CALCIUM 8.1* 8.4*   CBG (last 3)  No results for input(s): GLUCAP in the last 72 hours.  Wt Readings from Last 3 Encounters:  12/05/15 80.831 kg (178 lb 3.2 oz)  12/04/15 81.421 kg (179 lb 8 oz)  10/03/15 84.823 kg (187 lb)    Physical Exam:  BP 116/56 mmHg  Pulse 113  Temp(Src) 98.1 F (36.7 C) (Oral)  Resp 18  Ht 5\' 10"  (1.778 m)  Wt 80.831 kg (178 lb 3.2 oz)  BMI 25.57 kg/m2  SpO2 100% Constitutional: He appears well-developed and well-nourished. NAD. Vital signs reviewed. HENT: Normocephalic and atraumatic.  Mouth/Throat: Oropharynx is clear and moist. No oropharyngeal exudate.  Voice slightly hoarse  Eyes: Conjunctivae and EOM are normal.   Neck: Normal range of motion. No tracheal deviation present. No thyromegaly present.  Cardiovascular: Normal rate and regular rhythm. Exam reveals no friction rub. No murmur heard. Respiratory: Effort normal. No respiratory distress.  GI: Soft. Bowel sounds are normal. He exhibits no distension. There is no tenderness. There is no rebound.  Musculoskeletal:  He exhibits no edema or tenderness.  Neurological: He is alert and oriented Voice is stronger but still dysphonic at times. Able to follow commands without difficulty B/l UE: motor 5/5 proximal to distal B/l LE: 4-/5 hip flexion, knee  extension, 4/5 ankle dorsi/plantar flexion. Good insight and awareness. Attention reasonable  Skin: Skin is warm and dry.  Psychiatric: He has a normal mood and affect. His behavior is normal.   Assessment/Plan: 1. Functional deficits secondary to debility, cognitive/swallowing dysfunction after cardiac arrest which require 3+ hours per day of interdisciplinary therapy in a comprehensive inpatient rehab setting. Physiatrist is providing close team supervision and 24 hour management of active medical problems listed below. Physiatrist and rehab team continue to assess barriers to discharge/monitor patient progress toward functional and medical goals.  Function:  Bathing Bathing position      Bathing parts      Bathing assist        Upper Body Dressing/Undressing Upper body dressing                    Upper body assist        Lower Body Dressing/Undressing Lower body dressing Lower body dressing/undressing activity did not occur: N/A                                Lower body assist        Toileting Toileting Toileting activity did not occur: Safety/medical concerns Toileting steps completed by patient: Adjust clothing prior to toileting, Adjust clothing after toileting, Performs perineal hygiene   Toileting Assistive Devices: Grab bar or rail  Toileting assist Assist level: Touching or steadying assistance (Pt.75%)   Transfers Chair/bed transfer             Locomotion Ambulation     Max distance: 10 Assist level: Touching or steadying assistance (Pt > 75%)   Wheelchair          Cognition Comprehension Comprehension assist level: Follows complex conversation/direction with no assist, Follows basic conversation/direction with no assist  Expression Expression assist level: Expresses complex ideas: With no assist, Expresses basic needs/ideas: With no assist  Social Interaction Social Interaction assist level: Interacts appropriately with  others - No medications needed.  Problem Solving Problem solving assist level: Solves complex problems: Recognizes & self-corrects  Memory      Medical Problem List and Plan: 1. Debility, dysphagia, ?mild cognitive deficits secondary to secondary to cardiac arrest, anoxia  Begin CIR 2. DVT Prophylaxis/Anticoagulation: Pharmaceutical: Lovenox 3. Pain Management: tylenol prn for chest wall pain.  4. Mood: LCSW to follow for evaluation and support.  5. Neuropsych: This patient is capable of making decisions on her own behalf. 6. Skin/Wound Care: Routine pressure relief measures. Maintain adequate nutrition and hydration status.  7. Fluids/Electrolytes/Nutrition: Monitor I/O.   BMP WNL on 12/23 8. RA: Methotrexate on hold--to follow up with Rhemumatology regarding resumption. No NSAIDS/Mobic per cards.  9. HTN: Monitor with BID checks.  10. CAD with V fib arrest/STEMI: On ASA and Brillinta due to LAD DES as well as BB and statin 11. C diff colitis: On po flagyl and vancomycin D # 12/14. Educated (and allayed fear) patient on need for ongoing precautions.   Improving 12. HCAP: Has completed aztreoman for treatment. Continues to have ongoing cough despite scheduled robitussin. Changed to Tussionex for 48 hours to help manage symptoms.  13. Acute systolic CHF: On metoprolol bid. Check daily weights.  Low salt diet.  14. Chest wall pain: Educate on splinting when coughing to help with pain management.  15. Hypoalbuminemia: Will start supplementation today    LOS (Days) 1 A FACE TO FACE EVALUATION WAS PERFORMED  Aalivia Mcgraw Lorie Phenix 12/05/2015 11:32 AM

## 2015-12-05 NOTE — Evaluation (Signed)
Occupational Therapy Assessment and Plan  Patient Details  Name: [name] MRN: [id number] Date of Birth: 1946/09/04  OT Diagnosis: muscle weakness (generalized) Rehab Potential: Rehab Potential (ACUTE ONLY): Excellent ELOS: 5-7 days   Today's Date: 12/05/2015 OT Individual Time: 0930-1100 OT Individual Time Calculation (min): 90 min     Problem List:  Patient Active Problem List   Diagnosis Date Noted  . Physical debility 12/04/2015  . Debility 12/04/2015  . Encephalopathy   . Dysphagia   . Tachypnea   . Hypernatremia   . Leukocytosis   . Absolute anemia   . Thrombocytosis ([place])   . Difficult intravenous access   . Enteritis due to Clostridium difficile   . HCAP (healthcare-associated pneumonia)   . Hypokalemia 11/21/2015  . Cough 11/21/2015  . Acute delirium   . Acute respiratory failure with hypoxia ([place]) 11/12/2015  . Cardiac arrest ([place]) 11/12/2015  . Shock circulatory ([place]) 11/12/2015  . Coma ([place]) 11/12/2015  . CAD S/P PCI OF pLAD with 3.5 mm x38 mm Promus DES (3.75 mm) 11/12/2015  . Cardiomyopathy, ischemic - EF 20-25% by LV Gram 11/12/2015  . ST elevation (STEMI) myocardial infarction involving left anterior descending coronary artery ([place]) 11/11/2015  . Cardiac arrest with ventricular fibrillation ([place]) 11/11/2015  . Essential hypertension 03/24/2010  . ONYCHOMYCOSIS, TOENAILS 09/23/2009  . TOBACCO USE, QUIT 09/23/2009  . IMPAIRED GLUCOSE TOLERANCE 12/18/2008  . Allergic rhinitis 12/18/2008  . Rheumatoid arthritis ([place]) 12/18/2008  . INSOMNIA 12/18/2008  . NEPHROLITHIASIS, HX OF 12/18/2008    Past Medical History:  Past Medical History  Diagnosis Date  . Rheumatoid arthritis(714.0) 1998  . Nephrolithiasis 1968  . Insomnia   . Allergic rhinitis   . Impaired glucose tolerance   . Hypertension   . ST elevation (STEMI) myocardial infarction involving left anterior descending coronary artery ([place]) 11/11/2015    100% LAD, Cardiogenic Shock. --  EF ~20-25%  . Cardiac arrest with ventricular fibrillation ([place]) 11/11/2015  . CAD S/P PCI OF pLAD with 3.5 mm x38 mm Promus DES (3.75 mm) 11/12/2015  . Cardiomyopathy, ischemic - EF 20-25% by LV Gram 11/12/2015    There is severe left ventricular systolic dysfunction. The left ventricular ejection fraction is 25-35% by visual estimate. There are wall motion abnormalities in the left ventricle. There are segmental wall motion abnormalities in the left ventricle. The anterolateral and periapical LV walls are akinetic with an estimated LVEF of 25%   . Pelvic fracture ([place]) 09/2015  . Fractured rib    Past Surgical History:  Past Surgical History  Procedure Laterality Date  . Status post multiple lithotripy and urological surgery for reccurent calcium oxalate stones    . Cardiac catheterization N/A 11/11/2015    Procedure: Left Heart Cath and Cors/Grafts Angiography;  Surgeon: Sherren Mocha, MD;  Location: [place] [facility];  Service: Cardiovascular;  Laterality: N/A;  . Cardiac catheterization N/A 11/11/2015    Procedure: Coronary Stent Intervention;  Surgeon: Sherren Mocha, MD;  Location: [place] [facility];  Service: Cardiovascular;  Laterality: N/A;  prox lad 3.5x38 promus    Assessment & Plan Clinical Impression:Tina P Bolotin is a 69 y.o. male with history of RA, impaired glucose tolerance, HTN who was admitted to [facility] on 11/11/15 with 2 week history of intermittent CP and  witnessed cardiac arrest.  Family initiated CPR, he was treated with ACLS protocol and EKG in field showed evidence of NSTEMI. He was unresponsive and intubated in ED and taken to cath lab emergently. Cardiac  cath revealed occlusive LAD lesion which was treated with PCI and DES by Dr.  Burt Knack and was treated with hypothermia protocol.  Hospital course significant for respiratory failure due to HCAP as well as laryngeal edema post extubation 12/08 requiring reintubation on 12/13 due to increase in WOB and lethargy.   Diarrhea due to c diff colitis treated with vanc and flagyl with improvement in leucocytosis. He tolerated extubation on 12/15 and was started on dysphagia 2, nectar liquids. Repeat swallow study done today and patient advanced to dysphagia 3, thin liquids. Patient with resultant weakness with unsteady gait and inability to complete ADL tasks. CIR recommended for follow up therapy ARRIAN MANSON is a 69 y.o. male with history of RA, impaired glucose tolerance, HTN who was admitted to [facility] on 11/11/15 with 2 week history of intermittent CP and  witnessed cardiac arrest.  Family initiated CPR, he was treated with ACLS protocol and EKG in field showed evidence of NSTEMI. He was unresponsive and intubated in ED and taken to cath lab emergently. Cardiac cath revealed occlusive LAD lesion which was treated with PCI and DES by Dr.  Burt Knack and was treated with hypothermia protocol.  Hospital course significant for respiratory failure due to HCAP as well as laryngeal edema post extubation 12/08 requiring reintubation on 12/13 due to increase in WOB and lethargy.  Diarrhea due to c diff colitis treated with vanc and flagyl with improvement in leucocytosis. He tolerated extubation on 12/15 and was started on dysphagia 2, nectar liquids. Repeat swallow study done today and patient advanced to dysphagia 3, thin liquids. Patient with resultant weakness with unsteady gait and inability to complete ADL tasks. CIR recommended for follow up therapy.  Patient transferred to CIR on 12/04/2015 .    Patient currently requires min with basic self-care skills secondary to muscle weakness, decreased cardiorespiratoy endurance and decreased standing balance.  Prior to hospitalization, patient was fully independent working part time.  Patient will benefit from skilled intervention to increase independence with basic self-care skills prior to discharge home with care partner.  Anticipate patient will require intermittent supervision  and no further OT follow recommended.  OT - End of Session Endurance Deficit: Yes Endurance Deficit Description: increased heart rate OT Assessment Rehab Potential (ACUTE ONLY): Excellent Barriers to Discharge: Decreased caregiver support Barriers to Discharge Comments: wife works full time OT Patient demonstrates impairments in the following area(s): Balance;Endurance OT Basic ADL's Functional Problem(s): Bathing;Dressing;Toileting;Grooming OT Advanced ADL's Functional Problem(s): Simple Meal Preparation;Light Housekeeping OT Transfers Functional Problem(s): Toilet;Tub/Shower OT Additional Impairment(s): None OT Plan OT Intensity: Minimum of 1-2 x/day, 45 to 90 minutes OT Frequency: 5 out of 7 days OT Duration/Estimated Length of Stay: 5-7 days OT Treatment/Interventions: Balance/vestibular training;Discharge planning;DME/adaptive equipment instruction;Functional mobility training;Patient/family education;Psychosocial support;Self Care/advanced ADL retraining;Therapeutic Activities;Therapeutic Exercise;UE/LE Strength taining/ROM OT Self Feeding Anticipated Outcome(s): I OT Basic Self-Care Anticipated Outcome(s): mod I  OT Toileting Anticipated Outcome(s): mod I OT Bathroom Transfers Anticipated Outcome(s): mod I OT Recommendation Patient destination: Home Follow Up Recommendations: None Equipment Recommended: To be determined   Skilled Therapeutic Intervention Pt seen for initial evaluation and ADL retraining with a focus on safety awareness, activity tolerance, standing balance. Pt was eager to shower and did well standing and initiating tasks. He did need cues to not reach for the walker to stand, cues to move his body in front of the sink not the side of the sink when standing to brush his teeth. Pt did complete shower and dressing, his HR was  consistently 113 even with long seated rest breaks.  Pt taken in w/c to ADL apt to practice stepping in and out of bathtub. He did need min A  to balance to step feet over wall. Pt states his tub wall is much lower.  Pt became very tired and stated he felt "fuzzy". Pt taken back to room and he got into the bed. Pt resting with all needs met.  OT Evaluation Precautions/Restrictions  Precautions Precautions: Fall Restrictions Weight Bearing Restrictions: No    Vital Signs Therapy Vitals Pulse Rate: (!) 113 (after shower) Oxygen Therapy SpO2: 100 % O2 Device: Not Delivered Pain Pain Assessment Pain Assessment: 0-10 Pain Score: 8  Pain Type: Chronic pain Pain Location: Chest Pain Orientation: Anterior Pain Descriptors / Indicators: Pressure Pain Intervention(s): RN made aware Home Living/Prior Functioning Home Living Available Help at Discharge: Friend(s), Available PRN/intermittently Type of Home: House Home Access: Stairs to enter Technical brewer of Steps: 4 Entrance Stairs-Rails: Right Home Layout: Two level, Able to live on main level with bedroom/bathroom Bathroom Shower/Tub: Tub/shower unit Bathroom Toilet: Standard Additional Comments: pt reports in April when  injured, pt stayed on first level and borrowed a 3N1 and slept in recliner.  He was able to get to his 3N1 without her help.  Wife works upstairs in her office and cannot provide 24/7 care to him.    Lives With: Spouse Prior Function Level of Independence: Independent with basic ADLs, Independent with homemaking with ambulation, Independent with gait, Independent with transfers  Able to Take Stairs?: Yes Driving: Yes Vocation: Part time employment Comments: did part time work for [employer], wife can work from home ADL ADL ADL Comments: Refer to functional navigator Vision/Perception  Vision- History Baseline Vision/History: Wears glasses Wears Glasses: At all times Patient Visual Report: No change from baseline Vision- Assessment Vision Assessment?: No apparent visual deficits  Cognition Overall Cognitive Status: Within Functional Limits  for tasks assessed Arousal/Alertness: Awake/alert Orientation Level: Person;Place;Situation Person: Oriented Place: Oriented Situation: Oriented Year: 2016 Month: December Day of Week: Correct Immediate Memory Recall: Sock;Blue;Bed Memory Recall: Sock;Blue;Bed Memory Recall Sock: Without Cue Memory Recall Blue: Without Cue Memory Recall Bed: With Cue Safety/Judgment: Impaired Comments: decreased awareness of safe body positioning with transfers/ mobility Sensation Sensation Light Touch: Appears Intact Stereognosis: Appears Intact Hot/Cold: Appears Intact Proprioception: Appears Intact Coordination Gross Motor Movements are Fluid and Coordinated: Yes Fine Motor Movements are Fluid and Coordinated: Yes Motor  Motor Motor: Within Functional Limits Motor - Skilled Clinical Observations: generalized motor weakness Mobility    steadying A with ambulation with RW Trunk/Postural Assessment  Cervical Assessment Cervical Assessment: Within Functional Limits Thoracic Assessment Thoracic Assessment: Within Functional Limits Lumbar Assessment Lumbar Assessment: Within Functional Limits Postural Control Postural Control: Within Functional Limits  Balance Static Sitting Balance Static Sitting - Level of Assistance: 6: Modified independent (Device/Increase time) Dynamic Sitting Balance Dynamic Sitting - Level of Assistance: 6: Modified independent (Device/Increase time) Static Standing Balance Static Standing - Level of Assistance: 5: Stand by assistance Dynamic Standing Balance Dynamic Standing - Level of Assistance: 4: Min assist Extremity/Trunk Assessment RUE Assessment RUE Assessment: Within Functional Limits LUE Assessment LUE Assessment: Within Functional Limits   See Function Navigator for Current Functional Status.   Refer to Care Plan for Long Term Goals  Recommendations for other services: None  Discharge Criteria: Patient will be discharged from OT if patient  refuses treatment 3 consecutive times without medical reason, if treatment goals not met, if there is a change in medical  status, if patient makes no progress towards goals or if patient is discharged from hospital.  The above assessment, treatment plan, treatment alternatives and goals were discussed and mutually agreed upon: by patient  [facility] 12/05/2015, 11:01 AM

## 2015-12-05 NOTE — Evaluation (Signed)
Speech Language Pathology Assessment and Plan  Patient Details  Name: [name] MRN: [id number] Date of Birth: [birth year]  SLP Diagnosis: Cognitive Impairments;Voice disorder;Dysphagia  Rehab Potential: Good ELOS: 7-10 days    Today's Date: 12/05/2015 SLP Individual Time: 6967-8938 SLP Individual Time Calculation (min): 60 min   Problem List:  Patient Active Problem List   Diagnosis Date Noted  . Hypoalbuminemia due to protein-calorie malnutrition ([place])   . Physical debility 12/04/2015  . Debility 12/04/2015  . Encephalopathy   . Dysphagia   . Tachypnea   . Hypernatremia   . Leukocytosis   . Absolute anemia   . Thrombocytosis ([place])   . Difficult intravenous access   . Enteritis due to Clostridium difficile   . HCAP (healthcare-associated pneumonia)   . Hypokalemia 11/21/2015  . Cough 11/21/2015  . Acute delirium   . Acute respiratory failure with hypoxia ([place]) 11/12/2015  . Cardiac arrest ([place]) 11/12/2015  . Shock circulatory ([place]) 11/12/2015  . Coma ([place]) 11/12/2015  . CAD S/P PCI OF pLAD with 3.5 mm x38 mm Promus DES (3.75 mm) 11/12/2015  . Cardiomyopathy, ischemic - EF 20-25% by LV Gram 11/12/2015  . ST elevation (STEMI) myocardial infarction involving left anterior descending coronary artery ([place]) 11/11/2015  . Cardiac arrest with ventricular fibrillation ([place]) 11/11/2015  . Essential hypertension 03/24/2010  . ONYCHOMYCOSIS, TOENAILS 09/23/2009  . TOBACCO USE, QUIT 09/23/2009  . IMPAIRED GLUCOSE TOLERANCE 12/18/2008  . Allergic rhinitis 12/18/2008  . Rheumatoid arthritis ([place]) 12/18/2008  . INSOMNIA 12/18/2008  . NEPHROLITHIASIS, HX OF 12/18/2008   Past Medical History:  Past Medical History  Diagnosis Date  . Rheumatoid arthritis(714.0) 1998  . Nephrolithiasis 1968  . Insomnia   . Allergic rhinitis   . Impaired glucose tolerance   . Hypertension   . ST elevation (STEMI) myocardial infarction involving left anterior descending coronary  artery ([place]) 11/11/2015    100% LAD, Cardiogenic Shock. -- EF ~20-25%  . Cardiac arrest with ventricular fibrillation ([place]) 11/11/2015  . CAD S/P PCI OF pLAD with 3.5 mm x38 mm Promus DES (3.75 mm) 11/12/2015  . Cardiomyopathy, ischemic - EF 20-25% by LV Gram 11/12/2015    There is severe left ventricular systolic dysfunction. The left ventricular ejection fraction is 25-35% by visual estimate. There are wall motion abnormalities in the left ventricle. There are segmental wall motion abnormalities in the left ventricle. The anterolateral and periapical LV walls are akinetic with an estimated LVEF of 25%   . Pelvic fracture ([place]) 09/2015  . Fractured rib    Past Surgical History:  Past Surgical History  Procedure Laterality Date  . Status post multiple lithotripy and urological surgery for reccurent calcium oxalate stones    . Cardiac catheterization N/A 11/11/2015    Procedure: Left Heart Cath and Cors/Grafts Angiography;  Surgeon: Sherren Mocha, MD;  Location: [facility];  Service: Cardiovascular;  Laterality: N/A;  . Cardiac catheterization N/A 11/11/2015    Procedure: Coronary Stent Intervention;  Surgeon: Sherren Mocha, MD;  Location: [facility];  Service: Cardiovascular;  Laterality: N/A;  prox lad 3.5x38 promus    Assessment / Plan / Recommendation Clinical Impression Patient presents with a mild dysphagia, mild cognitive-linguisitic impairments, and mild-moderate voice impairment. His dysphagia is characterized by delayed swallow initiation and decreased hyoid-laryngeal elevation and pharyngeal contraction with regular solid textures. He does not exhibit any overt s/s of aspiration. Patient's cognitive-linguistic impairments consist of decreased memory retrieval and decreased recall of new information, as well as mild  frequency of phonemic paraphasias, tangential expressive language at conversational level, and mild delays in divided attention. Patient also exhibits a  mild-moderate voice disorder which is likely caused by prolonged intubation. His vocal quality is hoarse, with low vocal intensity and decreased respiratory support.   Skilled Therapeutic Interventions            SLP Assessment  Patient will need skilled Speech Lanaguage Pathology Services during CIR admission    Recommendations  SLP Diet Recommendations: Dysphagia 3 (Mech soft);Thin Liquid Administration via: Cup;Straw Medication Administration: Whole meds with liquid Supervision: Patient able to self feed;Intermittent supervision to cue for compensatory strategies Compensations: Slow rate;Small sips/bites;Multiple dry swallows after each bite/sip;Minimize environmental distractions Postural Changes and/or Swallow Maneuvers: Seated upright 90 degrees Oral Care Recommendations: Oral care BID Patient destination: Home Follow up Recommendations: None (anticipate that patient will not need home health SLP) Equipment Recommended: None recommended by SLP    SLP Frequency 3 to 5 out of 7 days   SLP Treatment/Interventions Cognitive remediation/compensation;Cueing hierarchy;Functional tasks;Speech/Language facilitation;Internal/external aids;Dysphagia/aspiration precaution training;Patient/family education   Pain Pain Assessment Pain Assessment: 0-10 Pain Score: 7  Pain Type: Acute pain Pain Location: Back Pain Orientation: Anterior Pain Descriptors / Indicators: Aching Pain Frequency: Occasional Pain Onset: On-going Patients Stated Pain Goal: 0 Pain Intervention(s): Repositioned Prior Functioning Cognitive/Linguistic Baseline: Within functional limits Type of Home: House  Lives With: Spouse Available Help at Discharge: Available PRN/intermittently;Family 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  Problem Solving Problem solving assist level: Solves basic 90% of the time/requires cueing < 10% of the time  Memory Memory assist level: Recognizes or recalls 75 - 89% of the time/requires cueing 10 - 24% of the time   Short Term Goals: Week 1: SLP Short Term Goal 1 (Week 1): Patient will tolerate trials of regular solids (currently on Dysphagia 3-mech soft and thin liquids) with no overt s/s of aspiration. SLP Short Term Goal 2 (Week 1): Patient will complete respiratory support and voice exercises with modified independence after training/educating by SLP. SLP Short Term Goal 3 (Week 1): Patient will demonstrate appropriate divided attention to complete complex-level problem solving and organization tasks, with 90% accuracy. SLP Short Term Goal 4 (Week 1): Patient will demonstrate recall of new/daily information using learned memory strategies (taking notes/log, using external aids, etc) with modified independence.  Refer to Care Plan for Long Term Goals  Recommendations for other services: None  Discharge Criteria: Patient will be discharged from SLP if patient refuses treatment 3 consecutive times without medical reason, if treatment goals not met, if there is a change in medical status, if patient makes no progress towards goals or if patient is discharged from hospital.  The above assessment, treatment plan, treatment alternatives and goals were discussed and mutually agreed upon: by patient  Dannial Monarch 12/05/2015, 3:13 PM  Sonia Baller, MA, CCC-SLP 12/05/2015 3:13 PM

## 2015-12-05 NOTE — Progress Notes (Signed)
Patient information reviewed and entered into eRehab system by Alisa Stjames, RN, CRRN, PPS Coordinator.  Information including medical coding and functional independence measure will be reviewed and updated through discharge.     Per nursing patient was given "Data Collection Information Summary for Patients in Inpatient Rehabilitation Facilities with attached "Privacy Act Statement-Health Care Records" upon admission.  

## 2015-12-05 NOTE — IPOC Note (Signed)
Overall Plan of Care [facility]) Patient Details Name: [name] MRN: [id number] DOB: 03-21-1946  Admitting Diagnosis: debility post cardiac arrest  Hospital Problems: Active Problems:   Physical debility   Hypoalbuminemia due to protein-calorie malnutrition ([place])     Functional Problem List: Nursing    PT Balance, Behavior, Endurance, Motor, Nutrition, Pain, Safety  OT Balance, Endurance  SLP Cognition, Linguistic  TR         Basic ADL's: OT Bathing, Dressing, Toileting, Grooming     Advanced  ADL's: OT Simple Meal Preparation, Light Housekeeping     Transfers: PT Bed Mobility, Bed to Chair, Furniture, Floor  OT Toilet, Tub/Shower     Locomotion: PT Ambulation, Emergency planning/management officer, Stairs     Additional Impairments: OT None  SLP Swallowing      TR      Anticipated Outcomes Item Anticipated Outcome  Self Feeding I  Swallowing  modified independent with regular texture solids and thin liquids   Basic self-care  mod I   Toileting  mod I   Bathroom Transfers mod I  Bowel/Bladder     Transfers  mod I   Locomotion  mod I household, supervision community  Communication  modified independent at complex conversational levels for expression and comprehension  Cognition  modified independent for basic to complex problem solving  Pain     Safety/Judgment      Therapy Plan: PT Intensity: Minimum of 1-2 x/day ,45 to 90 minutes PT Frequency: 5 out of 7 days PT Duration Estimated Length of Stay: 7-9 days OT Intensity: Minimum of 1-2 x/day, 45 to 90 minutes OT Frequency: 5 out of 7 days OT Duration/Estimated Length of Stay: 5-7 days SLP Frequency: 3 to 5 out of 7 days SLP Duration/Estimated Length of Stay: 7-10 days       Team Interventions: Nursing Interventions    PT interventions Ambulation/gait training, Training and development officer, Cognitive remediation/compensation, DME/adaptive equipment instruction, Community reintegration, Discharge  planning, Disease management/prevention, Functional mobility training, Neuromuscular re-education, Pain management, Patient/family education, Psychosocial support, Stair training, Therapeutic Activities, Therapeutic Exercise, UE/LE Coordination activities, UE/LE Strength taining/ROM, Wheelchair propulsion/positioning  OT Interventions Training and development officer, Discharge planning, DME/adaptive equipment instruction, Functional mobility training, Patient/family education, Psychosocial support, Self Care/advanced ADL retraining, Therapeutic Activities, Therapeutic Exercise, UE/LE Strength taining/ROM  SLP Interventions Cognitive remediation/compensation, Cueing hierarchy, Functional tasks, Speech/Language facilitation, Internal/external aids, Dysphagia/aspiration precaution training, Patient/family education  TR Interventions    SW/CM Interventions Discharge Planning, Psychosocial Support, Patient/Family Education    Team Discharge Planning: Destination: PT-Home ,OT- Home , SLP-Home Projected Follow-up: PT-Outpatient PT, OT-  None, SLP-None (anticipate that patient will not need home health SLP) Projected Equipment Needs: PT-To be determined, OT- To be determined, SLP-None recommended by SLP Equipment Details: PT-owns rollator, may need RW if rollator not safe at time of discharge, OT-  Patient/family involved in discharge planning: PT- Patient,  OT-Patient, SLP-Patient  MD ELOS: 7-10 days Medical Rehab Prognosis:  Excellent Assessment: 69 y.o. male with history of RA, impaired glucose tolerance, HTN who was admitted to [facility] on 11/11/15 with 2 week history of intermittent CP and witnessed cardiac arrest. Family initiated CPR, he was treated with ACLS protocol and EKG in field showed evidence of NSTEMI. He was unresponsive and intubated in ED and taken to cath lab emergently. Cardiac cath revealed occlusive LAD lesion which was treated with PCI and DES by Dr. Burt Knack and was treated with hypothermia  protocol. Hospital course significant for respiratory failure due to HCAP as well as laryngeal edema  post extubation 12/08 requiring reintubation on 12/13 due to increase in WOB and lethargy. Diarrhea due to c diff colitis treated with vanc and flagyl with improvement in leucocytosis. He tolerated extubation on 12/15 and was advanced to dysphagia 3, thin liquids. Patient with resultant weakness with unsteady gait and inability to complete ADL tasks.   See Team Conference Notes for weekly updates to the plan of care

## 2015-12-05 NOTE — Evaluation (Signed)
Physical Therapy Assessment and Plan  Patient Details  Name: [name] MRN: [id number] Date of Birth: Nov 08, 1946  PT Diagnosis: Abnormality of gait, Cognitive deficits, Difficulty walking, Muscle weakness and Pain in chest Rehab Potential: Good ELOS: 7-9 days   Today's Date: 12/05/2015 PT Individual Time: 1300-1400 PT Individual Time Calculation (min): 60 min    Problem List:  Patient Active Problem List   Diagnosis Date Noted  . Hypoalbuminemia due to protein-calorie malnutrition ([place])   . Physical debility 12/04/2015  . Debility 12/04/2015  . Encephalopathy   . Dysphagia   . Tachypnea   . Hypernatremia   . Leukocytosis   . Absolute anemia   . Thrombocytosis ([place])   . Difficult intravenous access   . Enteritis due to Clostridium difficile   . HCAP (healthcare-associated pneumonia)   . Hypokalemia 11/21/2015  . Cough 11/21/2015  . Acute delirium   . Acute respiratory failure with hypoxia ([place]) 11/12/2015  . Cardiac arrest ([place]) 11/12/2015  . Shock circulatory ([place]) 11/12/2015  . Coma ([place]) 11/12/2015  . CAD S/P PCI OF pLAD with 3.5 mm x38 mm Promus DES (3.75 mm) 11/12/2015  . Cardiomyopathy, ischemic - EF 20-25% by LV Gram 11/12/2015  . ST elevation (STEMI) myocardial infarction involving left anterior descending coronary artery ([place]) 11/11/2015  . Cardiac arrest with ventricular fibrillation ([place]) 11/11/2015  . Essential hypertension 03/24/2010  . ONYCHOMYCOSIS, TOENAILS 09/23/2009  . TOBACCO USE, QUIT 09/23/2009  . IMPAIRED GLUCOSE TOLERANCE 12/18/2008  . Allergic rhinitis 12/18/2008  . Rheumatoid arthritis ([place]) 12/18/2008  . INSOMNIA 12/18/2008  . NEPHROLITHIASIS, HX OF 12/18/2008    Past Medical History:  Past Medical History  Diagnosis Date  . Rheumatoid arthritis(714.0) 1998  . Nephrolithiasis 1968  . Insomnia   . Allergic rhinitis   . Impaired glucose tolerance   . Hypertension   . ST elevation (STEMI) myocardial infarction involving left  anterior descending coronary artery ([place]) 11/11/2015    100% LAD, Cardiogenic Shock. -- EF ~20-25%  . Cardiac arrest with ventricular fibrillation ([place]) 11/11/2015  . CAD S/P PCI OF pLAD with 3.5 mm x38 mm Promus DES (3.75 mm) 11/12/2015  . Cardiomyopathy, ischemic - EF 20-25% by LV Gram 11/12/2015    There is severe left ventricular systolic dysfunction. The left ventricular ejection fraction is 25-35% by visual estimate. There are wall motion abnormalities in the left ventricle. There are segmental wall motion abnormalities in the left ventricle. The anterolateral and periapical LV walls are akinetic with an estimated LVEF of 25%   . Pelvic fracture ([place]) 09/2015  . Fractured rib    Past Surgical History:  Past Surgical History  Procedure Laterality Date  . Status post multiple lithotripy and urological surgery for reccurent calcium oxalate stones    . Cardiac catheterization N/A 11/11/2015    Procedure: Left Heart Cath and Cors/Grafts Angiography;  Surgeon: Sherren Mocha, MD;  Location: [facility];  Service: Cardiovascular;  Laterality: N/A;  . Cardiac catheterization N/A 11/11/2015    Procedure: Coronary Stent Intervention;  Surgeon: Sherren Mocha, MD;  Location: [facility];  Service: Cardiovascular;  Laterality: N/A;  prox lad 3.5x38 promus    Assessment & Plan Clinical Impression: [name] is a 69 y.o. male with history of RA, impaired glucose tolerance, HTN who was admitted to [facility] on 11/11/15 with 2 week history of intermittent CP and witnessed cardiac arrest. Family initiated CPR, he was treated with ACLS protocol and EKG in field showed evidence of NSTEMI. He  was unresponsive and intubated in ED and taken to cath lab emergently. Cardiac cath revealed occlusive LAD lesion which was treated with PCI and DES by Dr. Burt Knack and was treated with hypothermia protocol. Hospital course significant for respiratory failure due to HCAP as well as laryngeal edema post  extubation 12/08 requiring reintubation on 12/13 due to increase in WOB and lethargy. Diarrhea due to c diff colitis treated with vanc and flagyl with improvement in leucocytosis. He tolerated extubation on 12/15 and was started on dysphagia 2, nectar liquids. Repeat swallow study done today and patient advanced to dysphagia 3, thin liquids. Patient with resultant weakness with unsteady gait and inability to complete ADL tasks.Patient transferred to CIR on 12/04/2015.   Patient currently requires min with mobility secondary to muscle weakness, decreased cardiorespiratoy endurance and decreased standing balance, decreased postural control and decreased balance strategies.  Prior to hospitalization, patient was independent  with mobility and lived with Spouse in a House home.  Home access is 4 (enters from garage)Stairs to enter.  Patient will benefit from skilled PT intervention to maximize safe functional mobility, minimize fall risk and decrease caregiver burden for planned discharge home with intermittent assist.  Anticipate patient will benefit from follow up OP at discharge.  PT - End of Session Activity Tolerance: Decreased this session;Tolerates 30+ min activity with multiple rests Endurance Deficit: Yes Endurance Deficit Description: required multiple rest breaks due to fatigue and chest pain (from CPR) PT Assessment Rehab Potential (ACUTE/IP ONLY): Good PT Patient demonstrates impairments in the following area(s): Balance;Behavior;Endurance;Motor;Nutrition;Pain;Safety PT Transfers Functional Problem(s): Bed Mobility;Bed to Chair;Furniture;Floor PT Locomotion Functional Problem(s): Ambulation;Wheelchair Mobility;Stairs PT Plan PT Intensity: Minimum of 1-2 x/day ,45 to 90 minutes PT Frequency: 5 out of 7 days PT Duration Estimated Length of Stay: 7-9 days PT Treatment/Interventions: Ambulation/gait training;Balance/vestibular training;Cognitive remediation/compensation;DME/adaptive  equipment instruction;Community reintegration;Discharge planning;Disease management/prevention;Functional mobility training;Neuromuscular re-education;Pain management;Patient/family education;Psychosocial support;Stair training;Therapeutic Activities;Therapeutic Exercise;UE/LE Coordination activities;UE/LE Strength taining/ROM;Wheelchair propulsion/positioning PT Transfers Anticipated Outcome(s): mod I  PT Locomotion Anticipated Outcome(s): mod I household, supervision community PT Recommendation Follow Up Recommendations: Outpatient PT Patient destination: Home Equipment Recommended: To be determined Equipment Details: owns rollator, may need RW if rollator not safe at time of discharge  Skilled Therapeutic Intervention Skilled therapeutic intervention initiated after completion of evaluation. Discussed with patient falls risk and focus of therapy during stay. Discussed possible length of stay, goals, and follow-up therapy. Patient currently requires supervision-steady assist for functional mobility using RW. Patient reports that he owns a rollator, therefore trialed ambulation using rollator in controlled environment with patient demonstrating unsafe behavior with increased gait speed and decreased control of movement, therefore plan to continue gait with RW or no AD at this time. Discussed safety in room with patient, who reported he "can get up by myself but I understand the policy that I need to call for help." Reinforced patient's current risk for falls and possible need for quick release belt, patient verbalized understanding and stated he would call for assistance "no matter what." Patient left sitting in wheelchair with all needs within reach.    PT Evaluation Precautions/Restrictions Precautions Precautions: Fall Restrictions Weight Bearing Restrictions: No General Chart Reviewed: Yes Family/Caregiver Present: No  Pain Pain Assessment Pain Assessment: 0-10 Pain Score: 8  Pain Type:  Chronic pain Pain Location: Chest Pain Orientation: Anterior Pain Descriptors / Indicators: Aching Pain Onset: On-going Pain Intervention(s): Emotional support;Rest Home Living/Prior Functioning Home Living Available Help at Discharge: Available PRN/intermittently;Family (wife works from home, daughter unemployed) Type of Home: [employer]  Home Access: Stairs to enter [employer] of Steps: 4 (enters from garage) Entrance Stairs-Rails: None Home Layout: Two level;Able to live on main level with bedroom/bathroom Bathroom Shower/Tub: Tub/shower unit Bathroom Toilet: Standard Bathroom Accessibility: Yes Additional Comments: pt reports in April when  injured, pt stayed on first level and borrowed a 3N1 and slept in recliner.  He was able to get to his 3N1 without her help.  Wife works upstairs in her office and cannot provide 24/7 care to him.    Lives With: Spouse Prior Function Level of Independence: Independent with basic ADLs;Independent with homemaking with ambulation;Independent with gait;Independent with transfers  Able to Take Stairs?: Yes Driving: Yes Vocation: Part time employment Vocation Requirements: [employer] Leisure: Hobbies-yes (Comment) Comments: kayaking, working on antique cars, helps out with grandkids, projects around house Vision/Perception   No change from baseline  Cognition Overall Cognitive Status: Impaired/Different from baseline Arousal/Alertness: Awake/alert Orientation Level: Oriented X4 Attention: Alternating;Divided Alternating Attention: Appears intact Divided Attention: Impaired Divided Attention Impairment: Verbal complex Memory: Impaired Memory Impairment: Retrieval deficit;Decreased recall of new information Awareness: Appears intact Problem Solving: Impaired Problem Solving Impairment: Verbal complex;Functional complex Executive Function: Writer: Impaired Organizing Impairment: Verbal complex;Functional complex Behaviors:  Other (comment) (tangential when responding to questions) Sensation Sensation Light Touch: Appears Intact Stereognosis: Not tested Hot/Cold: Appears Intact Proprioception: Appears Intact Coordination Gross Motor Movements are Fluid and Coordinated: Yes Fine Motor Movements are Fluid and Coordinated: Yes Motor  Motor Motor: Within Functional Limits Motor - Skilled Clinical Observations: generalized motor weakness  Mobility Bed Mobility Bed Mobility: Sit to Supine;Supine to Sit;Rolling Left;Rolling Right Rolling Right: 6: Modified independent (Device/Increase time) Rolling Left: 6: Modified independent (Device/Increase time) Supine to Sit: 6: Modified independent (Device/Increase time) Sit to Supine: 6: Modified independent (Device/Increase time) Transfers Transfers: Yes Sit to Stand: 5: Supervision;With armrests;With upper extremity assist Stand to Sit: 5: Supervision;With upper extremity assist;With armrests Stand Pivot Transfers: 4: Min guard;With armrests Locomotion  Ambulation Ambulation: Yes Ambulation/Gait Assistance: 5: Supervision Ambulation Distance (Feet): 200 Feet Assistive device: Rolling walker Ambulation/Gait Assistance Details: Verbal cues for precautions/safety;Tactile cues for posture Gait Gait: Yes Gait Pattern: Impaired Gait Pattern: Step-through pattern;Decreased stride length;Trunk flexed Gait velocity: decreased Stairs / Additional Locomotion Stairs: Yes Stairs Assistance: 5: Supervision Stair Management Technique: Two rails;Forwards;Alternating pattern Number of Stairs: 12 Height of Stairs: 3 (8 3", 4 6" stairs) Ramp: Not tested (comment) Curb: Not tested (comment) Product manager Mobility: Yes Wheelchair Assistance: 5: Supervision Wheelchair Assistance Details: Verbal cues for sequencing;Verbal cues for technique;Verbal cues for Information systems manager: Both upper extremities Wheelchair Parts Management:  Supervision/cueing Distance: 150 ft  Trunk/Postural Assessment  Cervical Assessment Cervical Assessment: Within Functional Limits Thoracic Assessment Thoracic Assessment: Within Functional Limits Lumbar Assessment Lumbar Assessment: Within Functional Limits Postural Control Postural Control: Within Functional Limits  Balance Static Sitting Balance Static Sitting - Level of Assistance: 6: Modified independent (Device/Increase time) Dynamic Sitting Balance Dynamic Sitting - Level of Assistance: 6: Modified independent (Device/Increase time) Static Standing Balance Static Standing - Level of Assistance: 5: Stand by assistance Dynamic Standing Balance Dynamic Standing - Level of Assistance: 4: Min assist Extremity Assessment  RUE Assessment RUE Assessment: Within Functional Limits LUE Assessment LUE Assessment: Within Functional Limits RLE Assessment RLE Assessment: Exceptions to [facility] RLE Strength RLE Overall Strength: Deficits RLE Overall Strength Comments: grossly 5/5 except hip flexion 4-/5 LLE Assessment LLE Assessment: Exceptions to [facility] LLE Strength LLE Overall Strength: Deficits LLE Overall Strength Comments: grossly 5/5 except hip flexion 3+/5  See Function Navigator for Current Functional Status.   Refer to Care Plan for Long Term Goals  Recommendations for other services: None  Discharge Criteria: Patient will be discharged from PT if patient refuses treatment 3 consecutive times without medical reason, if treatment goals not met, if there is a change in medical status, if patient makes no progress towards goals or if patient is discharged from hospital.  The above assessment, treatment plan, treatment alternatives and goals were discussed and mutually agreed upon: by patient  Laretta Alstrom 12/05/2015, 1:17 PM

## 2015-12-06 ENCOUNTER — Inpatient Hospital Stay (HOSPITAL_COMMUNITY): Payer: BLUE CROSS/BLUE SHIELD

## 2015-12-06 ENCOUNTER — Inpatient Hospital Stay (HOSPITAL_COMMUNITY): Payer: BLUE CROSS/BLUE SHIELD | Admitting: Occupational Therapy

## 2015-12-06 ENCOUNTER — Inpatient Hospital Stay (HOSPITAL_COMMUNITY): Payer: BLUE CROSS/BLUE SHIELD | Admitting: Physical Therapy

## 2015-12-06 DIAGNOSIS — R5381 Other malaise: Secondary | ICD-10-CM

## 2015-12-06 DIAGNOSIS — E46 Unspecified protein-calorie malnutrition: Secondary | ICD-10-CM

## 2015-12-06 DIAGNOSIS — M05742 Rheumatoid arthritis with rheumatoid factor of left hand without organ or systems involvement: Secondary | ICD-10-CM

## 2015-12-06 DIAGNOSIS — M05741 Rheumatoid arthritis with rheumatoid factor of right hand without organ or systems involvement: Secondary | ICD-10-CM

## 2015-12-06 NOTE — Progress Notes (Signed)
[name] is a 69 y.o. male 1945/12/16 [id number]  Subjective: No new complaints. No new problems. Slept well. Feeling OK.  Objective: Vital signs in last 24 hours: Temp:  [98 F (36.7 C)-98.4 F (36.9 C)] 98 F (36.7 C) (12/24 0526) Pulse Rate:  [96-113] 113 (12/24 1106) Resp:  [18] 18 (12/24 0526) BP: (107-124)/(62-66) 124/62 mmHg (12/24 1106) SpO2:  [97 %-98 %] 97 % (12/24 0923) Weight:  [169 lb 12.1 oz (77 kg)] 169 lb 12.1 oz (77 kg) (12/24 0526) Weight change: -8 lb 7.1 oz (-3.831 kg) Last BM Date: 12/05/15  Intake/Output from previous day: 12/23 0701 - 12/24 0700 In: 840 [P.O.:840] Out: -  Last cbgs: CBG (last 3)  No results for input(s): GLUCAP in the last 72 hours.   Physical Exam General: No apparent distress   HEENT: not dry Lungs: Normal effort. Lungs clear to auscultation, no crackles or wheezes. Cardiovascular: Regular rate and rhythm, no edema Abdomen: S/NT/ND; BS(+) Musculoskeletal:  unchanged Neurological: No new neurological deficits Wounds: N/A    Skin: clear  Aging changes Mental state: Alert, oriented, cooperative    Lab Results: BMET    Component Value Date/Time   NA 141 12/05/2015 0756   K 3.9 12/05/2015 0756   CL 112* 12/05/2015 0756   CO2 21* 12/05/2015 0756   GLUCOSE 170* 12/05/2015 0756   BUN 10 12/05/2015 0756   CREATININE 0.88 12/05/2015 0756   CALCIUM 8.4* 12/05/2015 0756   GFRNONAA >60 12/05/2015 0756   GFRAA >60 12/05/2015 0756   CBC    Component Value Date/Time   WBC 6.1 12/04/2015 2013   RBC 3.96* 12/04/2015 2013   HGB 12.4* 12/04/2015 2013   HCT 37.7* 12/04/2015 2013   PLT 275 12/04/2015 2013   MCV 95.2 12/04/2015 2013   MCH 31.3 12/04/2015 2013   MCHC 32.9 12/04/2015 2013   RDW 15.6* 12/04/2015 2013   LYMPHSABS 1.2 11/24/2015 0319   MONOABS 2.1* 11/24/2015 0319   EOSABS 0.0 11/24/2015 0319   BASOSABS 0.0 11/24/2015 0319    Studies/Results: No results found.  Medications: I have reviewed the  patient's current medications.  Assessment/Plan:  1. Debility, dysphagia, ?mild cognitive deficits secondary to secondary to cardiac arrest, anoxia Begin CIR 2. DVT Prophylaxis/Anticoagulation: Pharmaceutical: Lovenox 3. Pain Management: tylenol prn for chest wall pain.  4. Mood: LCSW to follow for evaluation and support.  5. Neuropsych: This patient is capable of making decisions on her own behalf. 6. Skin/Wound Care: Routine pressure relief measures. Maintain adequate nutrition and hydration status.  7. Fluids/Electrolytes/Nutrition: Monitor I/O.  BMP WNL on 12/23 8. RA: Methotrexate on hold--to follow up with Rhemumatology regarding resumption. No NSAIDS/Mobic per cards.  9. HTN: Monitor with BID checks.  10. CAD with V fib arrest/STEMI: On ASA and Brillinta due to LAD DES as well as BB and statin 11. C diff colitis: On po flagyl and vancomycin D # 12/14. Educated (and allayed fear) patient on need for ongoing precautions.  Improving 12. HCAP: Has completed aztreoman for treatment. Continues to have ongoing cough despite scheduled robitussin. Changed to Tussionex for 48 hours to help manage symptoms.  13. Acute systolic CHF: On metoprolol bid. Check daily weights. Low salt diet.  14. Chest wall pain: Educate on splinting when coughing to help with pain management.  15. Hypoalbuminemia: Will start supplementation today     Length of stay, days: 2  Walker Kehr , MD 12/06/2015, 2:01 PM

## 2015-12-06 NOTE — Progress Notes (Addendum)
Speech Language Pathology Weekly Progress and Session Note  Patient Details  Name: [name] MRN: [id number] Date of Birth: Jul 31, 1946  Beginning of progress report period: End of progress report period:     Short Term Goals: Week 1: SLP Short Term Goal 1 (Week 1): Patient will tolerate trials of regular solids (currently on Dysphagia 3-mech soft and thin liquids) with no overt s/s of aspiration. SLP Short Term Goal 2 (Week 1): Patient will complete respiratory support and voice exercises with modified independence after training/educating by SLP. SLP Short Term Goal 3 (Week 1): Patient will demonstrate appropriate divided attention to complete complex-level problem solving and organization tasks, with 90% accuracy. SLP Short Term Goal 4 (Week 1): Patient will demonstrate recall of new/daily information using learned memory strategies (taking notes/log, using external aids, etc) with modified independence.    New Short Term Goals: Week 1: SLP Short Term Goal 1 (Week 1): Patient will tolerate trials of regular solids (currently on Dysphagia 3-mech soft and thin liquids) with no overt s/s of aspiration. SLP Short Term Goal 2 (Week 1): Patient will complete respiratory support and voice exercises with modified independence after training/educating by SLP. SLP Short Term Goal 3 (Week 1): Patient will demonstrate appropriate divided attention to complete complex-level problem solving and organization tasks, with 90% accuracy. SLP Short Term Goal 4 (Week 1): Patient will demonstrate recall of new/daily information using learned memory strategies (taking notes/log, using external aids, etc) with modified independence.  Weekly Progress Updates:     Intensity:   Frequency:   Duration/Length of Stay:   Treatment/Interventions:     Daily Session  Skilled Therapeutic Interventions:  Speech focused on intervention for divided attention, working memory and Charity fundraiser. SLP reviewed results and recommendations/goals of SLE completed yesterday. Pt completed thought organization activity during discourse with SLP for divided attention. He exhibited mild difficutly dividing attention between written and verbal tasks simultaneously and required breaks from written activity to process and generate responses. Decreased emergent awareness of errors during written activity (generating hypothetical calendar provided written information). He states visual difficulty distinguishing boxes on calendar. Pt required moderate cues to identify error when calculating daughter's age and was unable to self correct. Used schedule (independently) to locate time of next session when unable to recall from 10 minutes prior. Pt tangential during session.       Function:   Eating Eating                 Cognition Comprehension Comprehension assist level: Understands complex 90% of the time/cues 10% of the time  Expression   Expression assist level: Expresses complex 90% of the time/cues < 10% of the time  Social Interaction Social Interaction assist level: Interacts appropriately with others with medication or extra time (anti-anxiety, antidepressant).  Problem Solving Problem solving assist level: Solves basic 75 - 89% of the time/requires cueing 10 - 24% of the time  Memory Memory assist level: Recognizes or recalls 90% of the time/requires cueing < 10% of the time   General    Pain Pain Assessment Pain Location: Back Pain Intervention(s): Repositioned  Therapy/Group: Individual Therapy  Houston Siren 12/06/2015, 10:57 AM

## 2015-12-06 NOTE — Progress Notes (Signed)
Physical Therapy Session Note  Patient Details  Name: [name] MRN: [id number] Date of Birth: 09-23-1946  Today's Date: 12/06/2015 PT Concurrent Time: 1030-1200 PT Concurrent Time Calculation (min): 90 min  Short Term Goals: Week 1:  PT Short Term Goal 1 (Week 1): = LTGs due to anticipated LOS       Therapy Documentation Precautions:  Precautions Precautions: Fall Restrictions Weight Bearing Restrictions: No   Sit to and from stand transfer with rollator supervision, verbal cues for locking and unlocking wheel locks.  Patient ambulated 200 feet with rollatorx3 trials. Patient ambulated with a step through gait pattern. Verbal cues for rollator management and negotiation of obstacles and environmental awareness.   Patient up and down 12 step with handrail step over step pattern, close supervision. Verbal cues for pacing and foot position on step.   Standing there ex:  B heel raises 3x10 B mini squats 3x10  Seated there ex with 3 pound cuff weights: B hip flex 3x10 B LAQ 3x10 B ankle pumps 3x10  Berg Balance test performed:  Sitting to standing ___4_____ Standing unsupported __4______ Sitting unsupported _____4___ Standing to sitting ____4____ Transfers ____4____ Standing with eyes closed ___4_____ Standing with feet together ___4_____ Reaching forward with outstretched arm ____2____ Retrieving object from floor ___3_____ Turning to look behind ___3_____ Turning 360 degrees ____2___ Placing alternate foot on stool __2______ Standing with one foot in front ___2_____ Standing on one foot _____0___ Total ___42_____  Patient tolerated treatment well. Vitals monitored and remained stable throughout session responding appropriately to activity. Patient was without pain during session except during coughing educated patient on splinting technique during coughing in order to improve comfort. Patient tolerated session with rest breaks throughout. Patient returned  to room at end of session with all needs met resting comfortably in chair at bedside.  Call bell within reach and patient educated not to be up without assistance. Patient verbalized understanding.   See Function Navigator for Current Functional Status.   Therapy/Group: Concurrent  Retta Diones 12/06/2015, 1:01 PM

## 2015-12-06 NOTE — Progress Notes (Signed)
Occupational Therapy Session Note  Patient Details  Name: [name] MRN: [id number] Date of Birth: 06/21/1946  Today's Date: 12/06/2015 OT Individual Time: 0800-0900 OT Individual Time Calculation (min): 60 min    Short Term Goals: Week 1:  OT Short Term Goal 1 (Week 1): STGs = LTGs  Skilled Therapeutic Interventions/Progress Updates:    Pt seen for OT session focusing on ADL re-training, functional activity tolerance, standing balance, and education.  Pt in supine upon arrival, finishing breakfast. HE declined bathing task but agreeable to tx session. He dressed seated on EOB with supervision for sit <> stand from RW to pull pants up.  He ambulated throughout room using RW with close supervision, requiring min-mod VCs for RW management. Pt with 1 LOB episode while trying to walk backwards with RW, requiring min-mod A to regain balance. Cont to educate regarding RW management and safety awareness.  Toileting task and transfers completed with supervision.  Pt ambulated throughout unit with supervision for functional activity tolerance/ endurance. He returned to room at end of session, requesting return to supine to rest before next session. Pt left in supine at end of session, all needs in reach.  Pt educated throughout session regarding energy observation, OT goals, safety awareness, and d/c planning.   Therapy Documentation Precautions:  Precautions Precautions: Fall Restrictions Weight Bearing Restrictions: No Pain: Pain Assessment Pain Assessment: 0-10 Pain Score: 4  Pain Type: Acute pain Pain Location: Chest Pain Orientation: Mid Pain Descriptors / Indicators: Aching Pain Onset: On-going Patients Stated Pain Goal: 2 Pain Intervention(s): Ambulation/ repositioned ADL: ADL ADL Comments: Refer to functional navigator  See Function Navigator for Current Functional Status.   Therapy/Group: Individual Therapy  Lewis, Vicie Cech C 12/06/2015, 6:39 AM

## 2015-12-07 DIAGNOSIS — L299 Pruritus, unspecified: Secondary | ICD-10-CM

## 2015-12-07 MED ORDER — TRIAMCINOLONE ACETONIDE 0.5 % EX CREA
TOPICAL_CREAM | Freq: Two times a day (BID) | CUTANEOUS | Status: DC
  Administered 2015-12-07: 14:00:00 via TOPICAL
  Administered 2015-12-07: 1 via TOPICAL
  Administered 2015-12-08: 20:00:00 via TOPICAL
  Filled 2015-12-07: qty 15

## 2015-12-07 MED ORDER — OXYMETAZOLINE HCL 0.05 % NA SOLN
1.0000 | Freq: Once | NASAL | Status: AC
  Administered 2015-12-07: 1 via NASAL
  Filled 2015-12-07: qty 15

## 2015-12-07 MED ORDER — OXYMETAZOLINE HCL 0.05 % NA SOLN
1.0000 | Freq: Two times a day (BID) | NASAL | Status: DC | PRN
  Administered 2015-12-07: 1 via NASAL
  Filled 2015-12-07: qty 15

## 2015-12-07 NOTE — Progress Notes (Signed)
[name] is a 69 y.o. male [birth year] [id number]  Subjective: C/o nasal congestion - needs Afrin. No new problems. Slept well. C/o itchy feet  Objective: Vital signs in last 24 hours: Temp:  [97.8 F (36.6 C)-97.9 F (36.6 C)] 97.8 F (36.6 C) (12/25 0510) Pulse Rate:  [95-112] 95 (12/25 0510) Resp:  [18] 18 (12/25 0510) BP: (105-121)/(58-73) 105/58 mmHg (12/25 0510) SpO2:  [96 %-99 %] 99 % (12/25 0808) Weight:  [170 lb 13.7 oz (77.5 kg)] 170 lb 13.7 oz (77.5 kg) (12/25 0500) Weight change: 1 lb 1.6 oz (0.5 kg) Last BM Date: 12/06/15  Intake/Output from previous day: 12/24 0701 - 12/25 0700 In: 49 [P.O.:580] Out: -  Last cbgs: CBG (last 3)  No results for input(s): GLUCAP in the last 72 hours.   Physical Exam General: No apparent distress   HEENT: dry Lungs: Normal effort. Lungs clear to auscultation, no crackles or wheezes. Cardiovascular: Regular rate and rhythm, no edema Abdomen: S/NT/ND; BS(+) Musculoskeletal:  unchanged Neurological: No new neurological deficits Wounds: N/A    Skin: clear  Aging changes. Feet - w/o rash Mental state: Alert, oriented, cooperative     Lab Results: BMET    Component Value Date/Time   NA 141 12/05/2015 0756   K 3.9 12/05/2015 0756   CL 112* 12/05/2015 0756   CO2 21* 12/05/2015 0756   GLUCOSE 170* 12/05/2015 0756   BUN 10 12/05/2015 0756   CREATININE 0.88 12/05/2015 0756   CALCIUM 8.4* 12/05/2015 0756   GFRNONAA >60 12/05/2015 0756   GFRAA >60 12/05/2015 0756   CBC    Component Value Date/Time   WBC 6.1 12/04/2015 2013   RBC 3.96* 12/04/2015 2013   HGB 12.4* 12/04/2015 2013   HCT 37.7* 12/04/2015 2013   PLT 275 12/04/2015 2013   MCV 95.2 12/04/2015 2013   MCH 31.3 12/04/2015 2013   MCHC 32.9 12/04/2015 2013   RDW 15.6* 12/04/2015 2013   LYMPHSABS 1.2 11/24/2015 0319   MONOABS 2.1* 11/24/2015 0319   EOSABS 0.0 11/24/2015 0319   BASOSABS 0.0 11/24/2015 0319    Studies/Results: No results  found.  Medications: I have reviewed the patient's current medications.  Assessment/Plan:  1. Debility, dysphagia, ?mild cognitive deficits secondary to secondary to cardiac arrest, anoxia Begin CIR 2. DVT Prophylaxis/Anticoagulation: Pharmaceutical: Lovenox 3. Pain Management: tylenol prn for chest wall pain.  4. Mood: LCSW to follow for evaluation and support.  5. Neuropsych: This patient is capable of making decisions on her own behalf. 6. Skin/Wound Care: Routine pressure relief measures. Maintain adequate nutrition and hydration status.  7. Fluids/Electrolytes/Nutrition: Monitor I/O.  BMP WNL on 12/23 8. RA: Methotrexate on hold--to follow up with Rhemumatology regarding resumption. No NSAIDS/Mobic per cards.  9. HTN: Monitor with BID checks.  10. CAD with V fib arrest/STEMI: On ASA and Brillinta due to LAD DES as well as BB and statin 11. C diff colitis: On po flagyl and vancomycin D # 12/14. Educated (and allayed fear) patient on need for ongoing precautions.  Improving 12. HCAP: Has completed aztreoman for treatment. Continues to have ongoing cough despite scheduled robitussin. Changed to Tussionex for 48 hours to help manage symptoms.  13. Acute systolic CHF: On metoprolol bid. Check daily weights. Low salt diet.  14. Chest wall pain: Educate on splinting when coughing to help with pain management.  15. Hypoalbuminemia: Will start supplementation today 16. Nasal congestion - Afrin prn     Length of stay, days: 3  [name] ,  MD 12/07/2015, 11:22 AM

## 2015-12-08 ENCOUNTER — Inpatient Hospital Stay (HOSPITAL_COMMUNITY): Payer: BLUE CROSS/BLUE SHIELD | Admitting: Speech Pathology

## 2015-12-08 ENCOUNTER — Inpatient Hospital Stay (HOSPITAL_COMMUNITY): Payer: BLUE CROSS/BLUE SHIELD | Admitting: Physical Therapy

## 2015-12-08 ENCOUNTER — Inpatient Hospital Stay (HOSPITAL_COMMUNITY): Payer: BLUE CROSS/BLUE SHIELD

## 2015-12-08 DIAGNOSIS — R0789 Other chest pain: Secondary | ICD-10-CM | POA: Diagnosis present

## 2015-12-08 NOTE — Progress Notes (Signed)
[name] PHYSICAL MEDICINE & REHABILITATION     PROGRESS NOTE    Subjective/Complaints:  Patient seen this morning resting comfortably in bed. He states that his chest wall pain and cough are significantly better. In fact, he mentions that he had the best night's sleep he has had in weeks.  ROS: Denies CP, SOB, abd pain, N/V/D  Objective: Vital Signs: Blood pressure 110/65, pulse 99, temperature 98.5 F (36.9 C), temperature source Oral, resp. rate 18, height 5\' 10"  (1.778 m), weight 78.563 kg (173 lb 3.2 oz), SpO2 97 %. No results found. No results for input(s): WBC, HGB, HCT, PLT in the last 72 hours. No results for input(s): NA, K, CL, GLUCOSE, BUN, CREATININE, CALCIUM in the last 72 hours.  Invalid input(s): CO CBG (last 3)  No results for input(s): GLUCAP in the last 72 hours.  Wt Readings from Last 3 Encounters:  12/08/15 78.563 kg (173 lb 3.2 oz)  12/04/15 81.421 kg (179 lb 8 oz)  10/03/15 84.823 kg (187 lb)    Physical Exam:  BP 110/65 mmHg  Pulse 99  Temp(Src) 98.5 F (36.9 C) (Oral)  Resp 18  Ht 5\' 10"  (1.778 m)  Wt 78.563 kg (173 lb 3.2 oz)  BMI 24.85 kg/m2  SpO2 97% Constitutional: He appears well-developed and well-nourished. NAD. Vital signs reviewed. HENT: Normocephalic and atraumatic.  Mouth/Throat: Oropharynx is clear and moist. No oropharyngeal exudate.  Eyes: Conjunctivae and EOM are normal.   Neck: Normal range of motion. No tracheal deviation present. No thyromegaly present.  Cardiovascular: Normal rate and regular rhythm. Exam reveals no friction rub. No murmur heard. Respiratory: Effort normal. No respiratory distress.  GI: Soft. Bowel sounds are normal. He exhibits no distension. There is no tenderness. There is no rebound.  Musculoskeletal: He exhibits no edema or tenderness.  Neurological: He is alert and oriented Voice is stronger. Able to follow commands without difficulty B/l UE: motor 5/5 proximal to distal B/l LE: 4/5 hip  flexion, knee extension, 4+/5 ankle dorsi/plantar flexion. Good insight and awareness. Attention reasonable  Skin: Skin is warm and dry.  Psychiatric: He has a normal mood and affect. His behavior is normal.   Assessment/Plan: 1. Functional deficits secondary to debility, cognitive/swallowing dysfunction after cardiac arrest which require 3+ hours per day of interdisciplinary therapy in a comprehensive inpatient rehab setting. Physiatrist is providing close team supervision and 24 hour management of active medical problems listed below. Physiatrist and rehab team continue to assess barriers to discharge/monitor patient progress toward functional and medical goals.  Function:  Bathing Bathing position Bathing activity did not occur: Refused Position: Production manager parts bathed by patient: Right arm, Left arm, Chest, Abdomen, Front perineal area, Buttocks, Right upper leg, Left upper leg, Right lower leg, Left lower leg    Bathing assist Assist Level: Supervision or verbal cues      Upper Body Dressing/Undressing Upper body dressing   What is the patient wearing?: Pull over shirt/dress     Pull over shirt/dress - Perfomed by patient: Thread/unthread right sleeve, Thread/unthread left sleeve, Put head through opening, Pull shirt over trunk          Upper body assist Assist Level: More than reasonable time      Lower Body Dressing/Undressing Lower body dressing Lower body dressing/undressing activity did not occur: N/A What is the patient wearing?: Pants, Underwear, Socks, Shoes Underwear - Performed by patient: Thread/unthread right underwear leg, Thread/unthread left underwear leg, Pull underwear up/down   Pants-  Performed by patient: Thread/unthread right pants leg, Thread/unthread left pants leg, Pull pants up/down       Socks - Performed by patient: Don/doff right sock, Don/doff left sock   Shoes - Performed by patient: Don/doff right shoe, Don/doff left  shoe, Fasten right, Fasten left            Lower body assist Assist for lower body dressing: Supervision or verbal cues      Naval architect activity did not occur: Safety/medical concerns Toileting steps completed by patient: Adjust clothing prior to toileting, Performs perineal hygiene, Adjust clothing after toileting   Toileting Assistive Devices: Grab bar or rail  Toileting assist Assist level: Supervision or verbal cues   Transfers Chair/bed transfer   Chair/bed transfer method: Ambulatory Chair/bed transfer assist level: Supervision or verbal cues Chair/bed transfer assistive device: Environmental consultant, Air cabin crew     Max distance: 200 Assist level: Supervision or verbal cues   Wheelchair   Type: Manual Max wheelchair distance: 150 Assist Level: Supervision or verbal cues  Cognition Comprehension Comprehension assist level: Understands complex 90% of the time/cues 10% of the time  Expression Expression assist level: Expresses basic needs/ideas: With no assist  Social Interaction Social Interaction assist level: Interacts appropriately with others with medication or extra time (anti-anxiety, antidepressant).  Problem Solving Problem solving assist level: Solves basic 90% of the time/requires cueing < 10% of the time  Memory Memory assist level: Recognizes or recalls 90% of the time/requires cueing < 10% of the time    Medical Problem List and Plan: 1. Debility, dysphagia, ?mild cognitive deficits secondary to secondary to cardiac arrest, anoxia  Cont CIR 2. DVT Prophylaxis/Anticoagulation: Pharmaceutical: Lovenox 3. Pain Management: tylenol prn for chest wall pain.  4. Mood: LCSW to follow for evaluation and support.  5. Neuropsych: This patient is capable of making decisions on her own behalf. 6. Skin/Wound Care: Routine pressure relief measures. Maintain adequate nutrition and hydration status.  7. Fluids/Electrolytes/Nutrition:  Monitor I/O.   BMP WNL on 12/23 8. RA: Methotrexate on hold--to follow up with Rhemumatology regarding resumption. No NSAIDS/Mobic per cards.  9. HTN: Monitor.  10. CAD with V fib arrest/STEMI: On ASA and Brillinta due to LAD DES as well as BB and statin 11. C diff colitis: Completed po flagyl and vancomycin D. Educated (and allayed fear) patient on need for ongoing precautions.   Diarrhea resolved 12. HCAP: Has completed aztreoman for treatment. Continues to have ongoing cough despite scheduled robitussin. Changed to Tussionex for 48 hours to help manage symptoms.  13. Acute systolic CHF: On metoprolol bid. Check daily weights. Low salt diet.  14. Chest wall pain: Educate on splinting when coughing to help with pain management.   Improving 15. Hypoalbuminemia: Supplementation started  LOS (Days) 4 A FACE TO FACE EVALUATION WAS PERFORMED  Lauria Depoy Lorie Phenix 12/08/2015 9:54 AM

## 2015-12-08 NOTE — Progress Notes (Signed)
Occupational Therapy Session Note  Patient Details  Name: [name] MRN: [id number] Date of Birth: 1946-03-14  Today's Date: 12/08/2015 OT Individual Time: 0945-1100 OT Individual Time Calculation (min): 75 min    Short Term Goals: Week 1:  OT Short Term Goal 1 (Week 1): STGs = LTGs  Skilled Therapeutic Interventions/Progress Updates:    Pt resting in bed upon arrival and agreeable to therapy.  Pt requested to shower this morning.  Pt completed bathing at shower level standing approx 75% of time.  Pt stated he had a seat in his shower at time.  Pt completed dressing with sit<>stand from toilet in bathroom.  Pt completed grooming tasks standing at sink.  Pt transitioned to ADL apartment and engaged in simple home mgmt tasks with focus on dynamic standing balance and functional amb.  Pt returned to room and made up his bed before sitting EOB with bedside table in front and alarm in place.  Pt completed all tasks at supervision level. Focus on activity tolerance, sit<>stand, standing balance, functional amb, and safety awareness to increase independence with BADLs.  Therapy Documentation Precautions:  Precautions Precautions: Fall Restrictions Weight Bearing Restrictions: No   Pain: Pain Assessment Pain Score: 0-No pain ADL: ADL ADL Comments: Refer to functional navigator  See Function Navigator for Current Functional Status.   Therapy/Group: Individual Therapy  Leroy Libman 12/08/2015, 11:03 AM

## 2015-12-08 NOTE — Progress Notes (Addendum)
Speech Language Pathology Daily Session Note  Patient Details  Name: [name] MRN: [id number] Date of Birth: [birth year]  Today's Date: 12/08/2015 SLP Individual Time: [id number] SLP Individual Time Calculation (min): 55 min  Short Term Goals: Week 1: SLP Short Term Goal 1 (Week 1): Patient will tolerate trials of regular solids (currently on Dysphagia 3-mech soft and thin liquids) with no overt s/s of aspiration. SLP Short Term Goal 2 (Week 1): Patient will complete respiratory support and voice exercises with modified independence after training/educating by SLP. SLP Short Term Goal 3 (Week 1): Patient will demonstrate appropriate divided attention to complete complex-level problem solving and organization tasks, with 90% accuracy. SLP Short Term Goal 4 (Week 1): Patient will demonstrate recall of new/daily information using learned memory strategies (taking notes/log, using external aids, etc) with modified independence.  Skilled Therapeutic Interventions: Skilled treatment session focused on dysphagia goals. SLP facilitated session by providing skilled observation with breakfast meal of Dys. 3 textures and thin liquids. Patient consumed meal without overt s/s of aspiration and was Mod I for use of swallowing compensatory strategies. Patient also consumed trials of regular textures and demonstrated efficient mastication without overt s/s of aspiration and is Mod I for use of multiple swallows. Recommend trial tray prior to upgrade. Patient divided attention between self-feeding and conversation with clinician for 30 minutes with Mod I.  Patient left supine in bed with all needs within reach. Continue with current plan of care.    Function:  Eating Eating   Modified Consistency Diet: Yes Eating Assist Level: No help, No cues           Cognition Comprehension Comprehension assist level: Understands complex 90% of the time/cues 10% of the time  Expression   Expression assist  level: Expresses basic needs/ideas: With no assist  Social Interaction Social Interaction assist level: Interacts appropriately with others with medication or extra time (anti-anxiety, antidepressant).  Problem Solving Problem solving assist level: Solves basic 90% of the time/requires cueing < 10% of the time  Memory Memory assist level: Recognizes or recalls 90% of the time/requires cueing < 10% of the time    Pain Pain Assessment Pain Assessment: 0-10 Pain Score: 3  Pain Type: Acute pain Pain Location: Chest Pain Orientation: Mid Pain Descriptors / Indicators: Aching Pain Onset: On-going Patients Stated Pain Goal: 0 Pain Intervention(s): Medication (See eMAR)  Therapy/Group: Individual Therapy  Leani Myron 12/08/2015, 8:50 AM

## 2015-12-08 NOTE — Progress Notes (Signed)
Social Work Patient ID: [name], male   DOB: [birth year], 69 y.o.   MRN: [id number] Team feels pt will reach his goals of mod/i level tomorrow after therapies.  Pt wants to go home. MD reports no medical issues and would be ready. Copntacted wife who reports she can't believe he is ready to go home, he has 15 steps at home and has just been placed on a diet today. She was told 13-16 days on CIR. She states: " There is no way he is ready to come home after three days of therapy."  She does plan to be here tomorrow at 9:00 she can not make the 8:00 am session. She plans to be Here tomorrow at 9:00-12:00 and 1:00-2:00pm sessions but does not plan to take him home unless she is comfortable with his levels and feels he will be safe at home while she is working upstairs. She states: " He has been asking to come home since coming out of his coma."  Will discuss this with wife and therapy team tomorrow when here for therapies.

## 2015-12-08 NOTE — Progress Notes (Signed)
Physical Therapy Session Note  Patient Details  Name: [name] MRN: [id number] Date of Birth: 04-09-1946  Today's Date: 12/08/2015 PT Individual Time: 1400-1515 PT Individual Time Calculation (min): 75 min   Short Term Goals: Week 1:  PT Short Term Goal 1 (Week 1): = LTGs due to anticipated LOS  Skilled Therapeutic Interventions/Progress Updates:   Pt received in bed; discussed with pt team discussion about possible D/C tomorrow after therapy pending progress towards PT LTG.  Pt feels he will be ready to D/C tomorrow.  Pt performed bed mobility mod I and ambulated with RW and supervision 150' with intermittent verbal cues for safety with RW over thresholds.  Pt reports having 4WW at home that he used after his fall with fractures.  Obtained [id number] and performed 6 minute walk test of endurance and to assess safety with 4WW.  Vitals at baseline: 96% Sp02, HR:106 bpm.  In 6 minutes pt ambulated 800 ft (243.84 meters) with supervision with ending vitals: 98%, HR:113 bpm with pt rating of light on RPE scale.  Continued training for home with floor > furniture transfer training with therapist demonstrating first and discussing indications for calling EMS; pt gave repeat demonstration with supervision.  Discussed stair negotiation for home entry/exit; pt reports there is a wall on L side as he ascends the stairs but he can have his brother install a rail on the L side.   He states there are 15 stairs to negotiate to his bedroom upstairs but he can stay on the main level in a recliner/sofa.  Pt performed negotiation of 4 stairs x 2 reps with LUE support against L rail with supervision-min A and pt selecting alternating sequence.  Discussed possible D/C after therapy tomorrow with pt and team, scheduling family education with pt's wife and f/u therapy options.  Pt returned to room and made MOD I in his room with the rollator by the team.  Pt left with all items within reach.      Therapy  Documentation Precautions:  Precautions Precautions: Fall Restrictions Weight Bearing Restrictions: No Vital Signs: Therapy Vitals Temp: 98.7 F (37.1 C) Temp Source: Oral Pulse Rate: (!) 103 Resp: 18 BP: 117/70 mmHg Patient Position (if appropriate): Sitting Oxygen Therapy SpO2: 100 % O2 Device: Not Delivered Pain:  No c/o pain   See Function Navigator for Current Functional Status.   Therapy/Group: Individual Therapy  Raylene Everts [facility] 12/08/2015, 4:17 PM

## 2015-12-09 ENCOUNTER — Ambulatory Visit (HOSPITAL_COMMUNITY): Payer: BLUE CROSS/BLUE SHIELD | Admitting: Speech Pathology

## 2015-12-09 ENCOUNTER — Inpatient Hospital Stay (HOSPITAL_COMMUNITY): Payer: BLUE CROSS/BLUE SHIELD

## 2015-12-09 ENCOUNTER — Inpatient Hospital Stay (HOSPITAL_COMMUNITY): Payer: BLUE CROSS/BLUE SHIELD | Admitting: Physical Therapy

## 2015-12-09 MED ORDER — TICAGRELOR 90 MG PO TABS: 90.0000 mg | ORAL_TABLET | Freq: Two times a day (BID) | ORAL | Status: DC

## 2015-12-09 MED ORDER — METOPROLOL TARTRATE 25 MG PO TABS: 12.5000 mg | ORAL_TABLET | Freq: Two times a day (BID) | ORAL | Status: DC

## 2015-12-09 MED ORDER — TRAZODONE HCL 50 MG PO TABS: 50.0000 mg | ORAL_TABLET | Freq: Every day | ORAL | Status: DC

## 2015-12-09 MED ORDER — CALCIUM POLYCARBOPHIL 625 MG PO TABS: 1250.0000 mg | ORAL_TABLET | Freq: Every day | ORAL | Status: DC

## 2015-12-09 MED ORDER — FAMOTIDINE 20 MG PO TABS
20.0000 mg | ORAL_TABLET | Freq: Two times a day (BID) | ORAL | Status: DC
  Administered 2015-12-09: 20 mg via ORAL
  Filled 2015-12-09: qty 1

## 2015-12-09 MED ORDER — ATORVASTATIN CALCIUM 80 MG PO TABS: 80.0000 mg | ORAL_TABLET | Freq: Every day | ORAL | Status: DC

## 2015-12-09 MED ORDER — FAMOTIDINE 20 MG PO TABS: 20.0000 mg | ORAL_TABLET | Freq: Two times a day (BID) | ORAL | Status: DC

## 2015-12-09 MED ORDER — TAMSULOSIN HCL 0.4 MG PO CAPS: 0.4000 mg | ORAL_CAPSULE | Freq: Every day | ORAL | Status: DC

## 2015-12-09 MED ORDER — FLUTICASONE PROPIONATE 50 MCG/ACT NA SUSP: NASAL | Status: DC

## 2015-12-09 MED ORDER — HYDROCODONE-ACETAMINOPHEN 5-325 MG PO TABS: 1.0000 | ORAL_TABLET | Freq: Four times a day (QID) | ORAL | Status: DC | PRN

## 2015-12-09 NOTE — Progress Notes (Signed)
Physical Therapy Discharge Summary  Patient Details  Name: [name] MRN: [id number] Date of Birth: 1946-10-04  Today's Date: 12/09/2015 PT Individual Time: 1000-1100 PT Individual Time Calculation (min): 60 min    Patient has met 9 of 10 long term goals due to improved activity tolerance, improved balance, improved postural control, increased strength, improved attention and improved awareness.  Patient to discharge at an ambulatory level Modified Independent.   Patient's care partner is independent to provide the necessary cognitive and supervision assistance at discharge for car transfers and stair negotiation.  Reasons goals not met: Pt did not meet mod I for car transfers; requires supervision and verbal cues for safe sequence with rollator  Recommendation:  Patient will benefit from ongoing skilled PT services in home health setting to continue to advance safe functional mobility, address ongoing impairments in impaired LE strength, activity tolerance/endurance, balance, gait, cognition/safety, and minimize fall risk.  Equipment: [phone]  Reasons for discharge: treatment goals met and discharge from hospital  Patient/family agrees with progress made and goals achieved: Yes   Wife present for family education today with focus on safety and energy conservation with use of 4WW during household and community gait, reviewed safe sequence for basic car and furniture transfers as below as well as safe stair negotiation for home entry/exit and to access second level bed/bath as documented below.  Educated on falls risk and floor > furniture transfers with pt demonstrating mod I transfer from floor; pt did descend quickly to floor and required therapist to slow descent.  Following floor transfer pt reporting 3/10 arthritis pain in L hip, RN notified for medication and ice applied at end of session.  Provided pt with handout of standing balance, endurance and strengthening HEP (OTAGO) and  demonstrated all exercises and recommendations to patient and wife.  Discussed equipment needs and f/u therapy recommendations including HHPT and transitioning to outpatient cardiac rehab when cleared by Cardiologist.  At end of session pt and wife had no further questions or concerns except questions about return to driving and prescriptions which were deferred to PA and MD.  PT Discharge Pain Pain Assessment Pain Assessment: 0-10 Pain Score: 4  Pain Type: Acute pain Pain Location: Hip Pain Orientation: Left Pain Descriptors / Indicators: Aching Pain Frequency: Intermittent Patients Stated Pain Goal: 2 Pain Intervention(s): Medication (See eMAR) Cognition Overall Cognitive Status: Within Functional Limits for tasks assessed Arousal/Alertness: Awake/alert Orientation Level: Oriented X4 Attention: Alternating Alternating Attention: Appears intact Memory: Appears intact Awareness: Appears intact Problem Solving: Appears intact Safety/Judgment: Appears intact Sensation Sensation Light Touch: Appears Intact Stereognosis: Not tested Hot/Cold: Appears Intact Proprioception: Appears Intact Coordination Gross Motor Movements are Fluid and Coordinated: Yes Fine Motor Movements are Fluid and Coordinated: Yes Motor  Motor Motor: Within Functional Limits Motor - Skilled Clinical Observations: generalized motor weakness Motor - Discharge Observations: Generalized weakness/deconditioning  Mobility Bed Mobility Rolling Right: 6: Modified independent (Device/Increase time) Rolling Left: 6: Modified independent (Device/Increase time) Supine to Sit: 6: Modified independent (Device/Increase time) Sit to Supine: 6: Modified independent (Device/Increase time) Transfers Sit to Stand: 6: Modified independent (Device/Increase time) Stand to Sit: 6: Modified independent (Device/Increase time) Stand Pivot Transfers: 6: Modified independent (Device/Increase time) Stand Pivot Transfer Details  (indicate cue type and reason): Stand pivot from bed or furniture Mod I; did require supervision and verbal cues for safe sequence to transfer into the car with the rollator.  Cues needed to pivot completely with rollator and back up to seat; pt attempting to leave  rollator to side and step over wheels to enter car.   Locomotion  Ambulation Ambulation/Gait Assistance: 6: Modified independent (Device/Increase time) Ambulation Distance (Feet): 300 Feet Assistive device: 4-wheeled walker Stairs / Additional Locomotion Stairs: Yes Stairs Assistance: 5: Supervision Stairs Assistance Details: Verbal cues for sequencing;Verbal cues for technique;Verbal cues for precautions/safety Stairs Assistance Details (indicate cue type and reason): Pt performed full flight of stair negotiation with 2 rails to simulate accessing upstairs bed/bathroom; pt performed with supervision self selecting alternating sequence but very fatigued afterwards; recommending to pt and wife that pt perform step to sequence and rest on each landing.  Also recommending that pt keep one rollator (his father in laws) upstairs and one downstairs so that his wife does not have to carry the rollator up/down stairs 2x/day.  At end of session pt too fatigued to perform stair negotiation for home entry/exit so pt observed as therapist educated wife on appropriate sequence and level of assistance to provide.  Pt to perform with UE support on L wall with wife providing close supervision-min A after placing rollator at top/bottom of stairs.   Stair Management Technique: Two rails;No rails;Step to pattern;Alternating pattern;Forwards Number of Stairs: 24 Height of Stairs: 7 Wheelchair Mobility Wheelchair Mobility: No  Trunk/Postural Assessment  Cervical Assessment Cervical Assessment: Within Functional Limits Thoracic Assessment Thoracic Assessment: Within Functional Limits Lumbar Assessment Lumbar Assessment: Within Functional Limits Postural  Control Protective Responses: Impaired hip and ankle strategies; increased use of step strategy  Balance Static Sitting Balance Static Sitting - Level of Assistance: 7: Independent Dynamic Sitting Balance Dynamic Sitting - Level of Assistance: 7: Independent Static Standing Balance Static Standing - Level of Assistance: 6: Modified independent (Device/Increase time) Dynamic Standing Balance Dynamic Standing - Level of Assistance: 6: Modified independent (Device/Increase time) Extremity Assessment  RUE Assessment RUE Assessment: Within Functional Limits LUE Assessment LUE Assessment: Within Functional Limits RLE Strength RLE Overall Strength: Deficits RLE Overall Strength Comments: 4-/5 hip flexion and knee flexiont, 5/5 knee extension and ankle DF LLE Strength LLE Overall Strength: Deficits LLE Overall Strength Comments: 4-/5 hip flexion and knee flexiont, 5/5 knee extension and ankle DF   See Function Navigator for Current Functional Status.  Raylene Everts Faucette 12/09/2015, 12:38 PM

## 2015-12-09 NOTE — Care Management Note (Signed)
[facility] Individual Statement of Services  Patient Name:  [name]  Date:  12/09/2015  Welcome to the [facility].  Our goal is to provide you with an individualized program based on your diagnosis and situation, designed to meet your specific needs.  With this comprehensive rehabilitation program, you will be expected to participate in at least 3 hours of rehabilitation therapies Monday-Friday, with modified therapy programming on the weekends.  Your rehabilitation program will include the following services:  Physical Therapy (PT), Occupational Therapy (OT), Speech Therapy (ST), 24 hour per day rehabilitation nursing, Neuropsychology, Case Management (Social Worker), Rehabilitation Medicine, Nutrition Services and Pharmacy Services  Weekly team conferences will be held on Tuesdays to discuss your progress.  Your Social Worker will talk with you frequently to get your input and to update you on team discussions.  Team conferences with you and your family in attendance may also be held.  Expected length of stay: 7 days  Overall anticipated outcome: mod independent  Depending on your progress and recovery, your program may change. Your Social Worker will coordinate services and will keep you informed of any changes. Your Social Worker's name and contact numbers are listed  below.  The following services may also be recommended but are not provided by the [facility] will be made to provide these services after discharge if needed.  Arrangements include referral to agencies that provide these services.  Your insurance has been verified to be:  [place] and Medicare Your primary doctor is:  Burnice Logan  Pertinent information will be shared with your doctor and your insurance company.  Social Worker:  [place], [place] or ([phone]   Information discussed with and copy given to patient by: Lennart Pall, 12/09/2015, 9:30 AM

## 2015-12-09 NOTE — Progress Notes (Signed)
Pt discharged to home with wife at 89. Discharge instructions given by Marlowe Shores, PA with verbal understanding. All belonging are with patient

## 2015-12-09 NOTE — Progress Notes (Signed)
Social Work  Social Work Assessment and Plan  Patient Details  Name: [name] MRN: [id number] Date of Birth: 08/15/1946  Today's Date: 12/05/2015  Problem List:  Patient Active Problem List   Diagnosis Date Noted  . Chest wall pain   . Hypoalbuminemia due to protein-calorie malnutrition ([place])   . Physical debility 12/04/2015  . Debility 12/04/2015  . Encephalopathy   . Dysphagia   . Tachypnea   . Hypernatremia   . Leukocytosis   . Absolute anemia   . Thrombocytosis ([place])   . Difficult intravenous access   . Enteritis due to Clostridium difficile   . HCAP (healthcare-associated pneumonia)   . Hypokalemia 11/21/2015  . Cough 11/21/2015  . Acute delirium   . Acute respiratory failure with hypoxia ([place]) 11/12/2015  . Cardiac arrest ([place]) 11/12/2015  . Shock circulatory ([place]) 11/12/2015  . Coma ([place]) 11/12/2015  . CAD S/P PCI OF pLAD with 3.5 mm x38 mm Promus DES (3.75 mm) 11/12/2015  . Cardiomyopathy, ischemic - EF 20-25% by LV Gram 11/12/2015  . ST elevation (STEMI) myocardial infarction involving left anterior descending coronary artery ([place]) 11/11/2015  . Cardiac arrest with ventricular fibrillation ([place]) 11/11/2015  . Essential hypertension 03/24/2010  . ONYCHOMYCOSIS, TOENAILS 09/23/2009  . TOBACCO USE, QUIT 09/23/2009  . IMPAIRED GLUCOSE TOLERANCE 12/18/2008  . Allergic rhinitis 12/18/2008  . Rheumatoid arthritis ([place]) 12/18/2008  . INSOMNIA 12/18/2008  . NEPHROLITHIASIS, HX OF 12/18/2008   Past Medical History:  Past Medical History  Diagnosis Date  . Rheumatoid arthritis(714.0) 1998  . Nephrolithiasis 1968  . Insomnia   . Allergic rhinitis   . Impaired glucose tolerance   . Hypertension   . ST elevation (STEMI) myocardial infarction involving left anterior descending coronary artery ([place]) 11/11/2015    100% LAD, Cardiogenic Shock. -- EF ~20-25%  . Cardiac arrest with ventricular fibrillation ([place]) 11/11/2015  . CAD S/P PCI OF pLAD with 3.5 mm  x38 mm Promus DES (3.75 mm) 11/12/2015  . Cardiomyopathy, ischemic - EF 20-25% by LV Gram 11/12/2015    There is severe left ventricular systolic dysfunction. The left ventricular ejection fraction is 25-35% by visual estimate. There are wall motion abnormalities in the left ventricle. There are segmental wall motion abnormalities in the left ventricle. The anterolateral and periapical LV walls are akinetic with an estimated LVEF of 25%   . Pelvic fracture ([place]) 09/2015  . Fractured rib    Past Surgical History:  Past Surgical History  Procedure Laterality Date  . Status post multiple lithotripy and urological surgery for reccurent calcium oxalate stones    . Cardiac catheterization N/A 11/11/2015    Procedure: Left Heart Cath and Cors/Grafts Angiography;  Surgeon: Sherren Mocha, MD;  Location: [facility];  Service: Cardiovascular;  Laterality: N/A;  . Cardiac catheterization N/A 11/11/2015    Procedure: Coronary Stent Intervention;  Surgeon: Sherren Mocha, MD;  Location: [facility];  Service: Cardiovascular;  Laterality: N/A;  prox lad 3.5x38 promus   Social History:  reports that he has quit smoking. He has never used smokeless tobacco. He reports that he does not drink alcohol or use illicit drugs.  Family / Support Systems Marital Status: Married Patient Roles: Spouse, Parent Spouse/Significant Other: wife, Tayte Lorek @ (C) [phone] Children: they have an adult daughter living locally Anticipated Caregiver: wife Ability/Limitations of Caregiver: Wife works days 7:30 am to 6:30 pm;  she works from home and is upstairs during the work hours. Caregiver Availability: Intermittent Family Dynamics:  Pt concerned that he does not want to "burden" his wife.  Social History Preferred language: English Religion:  Cultural Background: NA Read: Yes Write: Yes Employment Status: Retired Freight forwarder Issues: None Guardian/Conservator: None - per MD,  pt capable of making decisions on his own behalf   Abuse/Neglect Physical Abuse: Denies Verbal Abuse: Denies Sexual Abuse: Denies Exploitation of patient/patient's resources: Denies Self-Neglect: Denies  Emotional Status Pt's affect, behavior adn adjustment status: Pt very pleasant and able to complete assessment interview without difficulty.  States he has no recall of events around admission.  Denies any s/s of any emotional distress - defer any formal depression screen, however, will monitor. Recent Psychosocial Issues: Fall in April Pyschiatric History: None Substance Abuse History: None  Patient / Family Perceptions, Expectations & Goals Pt/Family understanding of illness & functional limitations: Pt with good understanding of his medical issues and current debility/ need for CIR. Premorbid pt/family roles/activities: Pt was completely independent. Anticipated changes in roles/activities/participation: Wife will need to assume some caregiver assistance duties - supervision? Pt/family expectations/goals: Pt hopeful he will be home by next week.  Community Resources [employer]s: None Premorbid Home Care/DME Agencies: None Transportation available at discharge: yes Resource referrals recommended: Neuropsychology  Discharge Planning Living Arrangements: Spouse/significant other Support Systems: Spouse/significant other, Children Type of Residence: Private residence Insurance Resources: [employer], Multimedia programmer (specify) (BCBS is primary) Pensions consultant: Radio broadcast assistant Screen Referred: No Living Expenses: Motgage Does the patient have any problems obtaining your medications?: No Home Management: pt and wife Patient/Family Preliminary Plans: Pt to return home with his wife. Social Work Anticipated Follow Up Needs: HH/OP Expected length of stay: 13-16 days  Clinical Impression Very pleasant gentleman here following a cardiac arrest and deconditioned.   Wife to be primary support, however, she does work from home and can check only intermittently.  Pt denies any s/s of emotional distress.  Will follow for support and d/c planning needs.  Natalya Domzalski 12/05/2015, 4:26 PM

## 2015-12-09 NOTE — Progress Notes (Signed)
Social Work  Discharge Note  The overall goal for the admission was met for:   Discharge location: Yes - home with wife able to provide intermittent assist  Length of Stay: Yes - 5 days  Discharge activity level: Yes - supervision/ mod independent overall  Home/community participation: Yes  Services provided included: MD, RD, PT, OT, SLP, RN, TR, Pharmacy and [place]: Medicare and Private Insurance: [place] (primary)  Follow-up services arranged: Home Health: RN, PT via [facility], DME: rollator, tub seat without back via [facility] and Patient/Family has no preference for HH/DME agencies  Comments (or additional information):  Patient/Family verbalized understanding of follow-up arrangements: Yes  Individual responsible for coordination of the follow-up plan: pt  Confirmed correct DME delivered: Johnie Stadel 12/09/2015    [place], [place]

## 2015-12-09 NOTE — Discharge Instructions (Signed)
Inpatient Rehab Discharge Instructions  [facility] Discharge date and time: No discharge date for patient encounter.   Activities/Precautions/ Functional Status: Activity: activity as tolerated Diet: soft Wound Care: none needed Functional status:  ___ No restrictions     ___ Walk up steps independently ___ 24/7 supervision/assistance   ___ Walk up steps with assistance ___ Intermittent supervision/assistance  ___ Bathe/dress independently ___ Walk with walker     ___ Bathe/dress with assistance ___ Walk Independently    ___ Shower independently _x__ Walk with assistance    ___ Shower with assistance ___ No alcohol     ___ Return to work/school ________   COMMUNITY REFERRALS UPON DISCHARGE:    Home Health:   PT       RN                     Agency:  [facility] Phone:  [phone]   Medical Equipment/Items Ordered:  rollator walker, tub seat                                                      Agency/Supplier:  [facility]      Special Instructions:  No driving  My questions have been answered and I understand these instructions. I will adhere to these goals and the provided educational materials after my discharge from the hospital.  Patient/Caregiver Signature _______________________________ Date __________  Clinician Signature _______________________________________ Date __________  Please bring this form and your medication list with you to all your follow-up doctor's appointments.

## 2015-12-09 NOTE — Patient Care Conference (Signed)
Inpatient RehabilitationTeam Conference and Plan of Care Update Date: 12/09/2015   Time: 10:40 AM    Patient Name: [name]      Medical Record Number: [id number]  Date of Birth: 1946-01-10 Sex: Male         Room/Bed: 4W17C/4W17C-01 Payor Info: Payor: MEDICARE / Plan: MEDICARE PART A AND B / Product Type: *No Product type* /    Admitting Diagnosis: debility post cardiac arrest  Admit Date/Time:  12/04/2015  5:10 PM Admission Comments: No comment available   Primary Diagnosis:  <principal problem not specified> Principal Problem: <principal problem not specified>  Patient Active Problem List   Diagnosis Date Noted  . Chest wall pain   . Hypoalbuminemia due to protein-calorie malnutrition ([place])   . Physical debility 12/04/2015  . Debility 12/04/2015  . Encephalopathy   . Dysphagia   . Tachypnea   . Hypernatremia   . Leukocytosis   . Absolute anemia   . Thrombocytosis ([place])   . Difficult intravenous access   . Enteritis due to Clostridium difficile   . HCAP (healthcare-associated pneumonia)   . Hypokalemia 11/21/2015  . Cough 11/21/2015  . Acute delirium   . Acute respiratory failure with hypoxia ([place]) 11/12/2015  . Cardiac arrest ([place]) 11/12/2015  . Shock circulatory ([place]) 11/12/2015  . Coma ([place]) 11/12/2015  . CAD S/P PCI OF pLAD with 3.5 mm x38 mm Promus DES (3.75 mm) 11/12/2015  . Cardiomyopathy, ischemic - EF 20-25% by LV Gram 11/12/2015  . ST elevation (STEMI) myocardial infarction involving left anterior descending coronary artery ([place]) 11/11/2015  . Cardiac arrest with ventricular fibrillation ([place]) 11/11/2015  . Essential hypertension 03/24/2010  . ONYCHOMYCOSIS, TOENAILS 09/23/2009  . TOBACCO USE, QUIT 09/23/2009  . IMPAIRED GLUCOSE TOLERANCE 12/18/2008  . Allergic rhinitis 12/18/2008  . Rheumatoid arthritis ([place]) 12/18/2008  . INSOMNIA 12/18/2008  . NEPHROLITHIASIS, HX OF 12/18/2008    Expected Discharge Date: Expected Discharge Date:  12/09/15  Team Members Present: Physician leading conference: Dr. Delice Lesch Nurse Present: Dorien Chihuahua, RN PT Present: Canary Brim, PT OT Present: Napoleon Form, OT SLP Present: Weston Anna, SLP PPS Coordinator present : Daiva Nakayama, RN, CRRN     Current Status/Progress Goal Weekly Team Focus  Medical   Debility, dysphagia, secondary to secondary to cardiac arrest with significant improvement in function  Improve endurance and function  see above   Bowel/Bladder   pt continent of bowel and bladder. LBM 12-27   Mod I  contiune POC with b/b   Swallow/Nutrition/ Hydration   Regular textures with thin liquids, Mod I  Mod I with least restrictive diet  Family education    ADL's   supervision/mod I BADLs  mod I overall  family educaiton, discharge home   Mobility   Mod I overall  Supervision-mod I overall  Family education/training, D/C tomorrow after therapy   Communication             Safety/Cognition/ Behavioral Observations            Pain   2 tylenol PRN q 4hrs for chest pain  3 or less  assess pain q shift and medicate as needed   Skin   skin CDI          Rehab Goals Patient on target to meet rehab goals: Yes *See Care Plan and progress notes for long and short-term goals.  Barriers to Discharge: None at present    Possible Resolutions to Barriers:  N/A    Discharge Planning/Teaching Needs:  Home with wife who can provide only intermittent support.  Teaching being completed with wife today.   Team Discussion:  Pt has made good gains, meeting supervision/ mod i goals and family ed being completed today.  Team feels he is ready for d/c.  Revisions to Treatment Plan:  None   Continued Need for Acute Rehabilitation Level of Care: The patient requires daily medical management by a physician with specialized training in physical medicine and rehabilitation for the following conditions: Daily direction of a multidisciplinary physical rehabilitation program to  ensure safe treatment while eliciting the highest outcome that is of practical value to the patient.: Yes Daily medical management of patient stability for increased activity during participation in an intensive rehabilitation regime.: Yes Daily analysis of laboratory values and/or radiology reports with any subsequent need for medication adjustment of medical intervention for : Other;Cardiac problems  Annaleigh Steinmeyer 12/09/2015, 1:39 PM

## 2015-12-09 NOTE — Progress Notes (Signed)
Occupational Therapy Discharge Summary  Patient Details  Name: [name] MRN: [id number] Date of Birth: May 12, 1946   Patient has met 8 of 8 long term goals due to improved activity tolerance, improved balance, ability to compensate for deficits and improved awareness.  Pt made steady progress with BADLs and is mod I for bathing, dressing, and toileting tasks.  Pt's wife has been present to observe therapy and participate in family education. Pt and wife are pleased with progress and ready for discharge home.Patient to discharge at overall Modified Independent level.  Patient's care partner is independent to provide the necessary physical assistance at discharge.     Recommendation: No further OT services are required at this time.  Equipment: Shower seat  Reasons for discharge: treatment goals met  Patient/family agrees with progress made and goals achieved: Yes  OT Discharge ADL ADL ADL Comments: Refer to functional navigator Vision/Perception  Vision- History Baseline Vision/History: Wears glasses Wears Glasses: At all times Patient Visual Report: No change from baseline Vision- Assessment Vision Assessment?: No apparent visual deficits  Cognition Overall Cognitive Status: Within Functional Limits for tasks assessed Arousal/Alertness: Awake/alert Orientation Level: Oriented X4 Attention: Alternating Alternating Attention: Appears intact Memory: Appears intact Awareness: Appears intact Problem Solving: Appears intact Safety/Judgment: Appears intact Sensation Sensation Light Touch: Appears Intact Stereognosis: Not tested Hot/Cold: Appears Intact Proprioception: Appears Intact Coordination Gross Motor Movements are Fluid and Coordinated: Yes Fine Motor Movements are Fluid and Coordinated: Yes Motor  Motor Motor: Within Functional Limits Motor - Skilled Clinical Observations: generalized motor weakness Motor - Discharge Observations: Generalized  weakness/deconditioning Mobility  Bed Mobility Rolling Right: 6: Modified independent (Device/Increase time) Rolling Left: 6: Modified independent (Device/Increase time) Supine to Sit: 6: Modified independent (Device/Increase time) Sit to Supine: 6: Modified independent (Device/Increase time) Transfers Sit to Stand: 6: Modified independent (Device/Increase time) Stand to Sit: 6: Modified independent (Device/Increase time)  Trunk/Postural Assessment  Cervical Assessment Cervical Assessment: Within Functional Limits Thoracic Assessment Thoracic Assessment: Within Functional Limits Lumbar Assessment Lumbar Assessment: Within Functional Limits  Balance Static Sitting Balance Static Sitting - Level of Assistance: 7: Independent Dynamic Sitting Balance Dynamic Sitting - Level of Assistance: 7: Independent Extremity/Trunk Assessment RUE Assessment RUE Assessment: Within Functional Limits LUE Assessment LUE Assessment: Within Functional Limits   See Function Navigator for Current Functional Status.  Leotis Shames [facility] 12/09/2015, 12:28 PM

## 2015-12-09 NOTE — Progress Notes (Signed)
Occupational Therapy Session Note  Patient Details  Name: [name] MRN: [id number] Date of Birth: 06/15/1946  Today's Date: 12/09/2015 OT Individual Time: 0900-1000 OT Individual Time Calculation (min): 60 min    Short Term Goals: Week 1:  OT Short Term Goal 1 (Week 1): STGs = LTGs  Skilled Therapeutic Interventions/Progress Updates:    Pt sitting EOB upon arrival.  Pt's wife present to observe and for family education.  Pt completed all tasks at mod I/I level including bathing at shower level and dressing with sit<>stand from EOB.  Pt's wife stated that they have a tub and walk in shower.  Recommended that patient have a shower seat and CSW notified.  Discussed household safety and recommendation for daily routine.  Pt and wife verbalized understanding.  Pt amb with Rollator to ADL apartment and practiced stepping over into tub with supervision.  Pt returned to room and remained seated with wife present.  Pt and wife pleased with progress.   Therapy Documentation Precautions:  Precautions Precautions: Fall Restrictions Weight Bearing Restrictions: No  Pain: Pain Assessment Pain Score: Pt denied pain; "just some soreness in rib cage" ADL: ADL ADL Comments: Refer to functional navigator  See Function Navigator for Current Functional Status.   Therapy/Group: Individual Therapy  Leroy Libman 12/09/2015, 10:07 AM

## 2015-12-09 NOTE — Progress Notes (Signed)
Speech Language Pathology Session Note & Discharge Summary  Patient Details  Name: [name] MRN: [id number] Date of Birth: 16-Aug-1946  Today's Date: 12/09/2015 SLP Individual Time: 1200-1230 SLP Individual Time Calculation (min): 30 min   Skilled Therapeutic Interventions:  Skilled treatment session focused on dysphagia goals and family education with the patient's wife. SLP facilitated session by providing skilled observation with lunch tray of regular textures and thin liquids. Patient demonstrated efficient mastication and consumed meal with minimal overt s/s of aspiration and was Mod I for use of swallowing compensatory strategies. Patient's wife educated in regards to patient's current swallowing function and compensatory strategies and verbalized understanding of all information. Patient left EOB with wife present.     Patient has met 2 of 2 long term goals.  Patient to discharge at overall Modified Independent level.   Reasons goals not met: N/A   Clinical Impression/Discharge Summary: Patient has made functional gains and has met 2 of 2 LTG's this admission. Currently, patient is consuming regular textures with thin liquids with minimal overt s/s of aspiration and is Mod I for use of swallowing compensatory strategies. Patient continues to demonstrate a hoarse vocal quality, suspect due to prolonged and multiple intubations, however, he is 100% intelligible. Education is complete and patient will discharge home with 24 hour supervision from family. Skilled SLP f/u is not warranted at this time, however, educated the patient and his wife in regards for possible ENT if voice does not return to baseline, both verbalized understanding.   Care Partner:  Caregiver Able to Provide Assistance: Yes     Recommendation:  None      Equipment: N/A   Reasons for discharge: Treatment goals met;Discharged from hospital   Patient/Family Agrees with Progress Made and Goals Achieved:  Yes   Function:  Eating Eating   Modified Consistency Diet: No Eating Assist Level: No help, No cues           Cognition Comprehension Comprehension assist level: Understands complex 90% of the time/cues 10% of the time  Expression   Expression assist level: Expresses basic needs/ideas: With no assist  Social Interaction Social Interaction assist level: Interacts appropriately with others with medication or extra time (anti-anxiety, antidepressant).  Problem Solving Problem solving assist level: Solves basic 90% of the time/requires cueing < 10% of the time  Memory Memory assist level: Recognizes or recalls 90% of the time/requires cueing < 10% of the time   Alandis Bluemel 12/09/2015, 1:06 PM

## 2015-12-09 NOTE — Progress Notes (Signed)
[facility] PHYSICAL MEDICINE & REHABILITATION     PROGRESS NOTE    Subjective/Complaints:  Pt doing very well this AM and is looking forward to going home today.    ROS: Denies CP, SOB, abd pain, N/V/D  Objective: Vital Signs: Blood pressure 102/69, pulse 105, temperature 97.7 F (36.5 C), temperature source Oral, resp. rate 16, height 5\' 10"  (1.778 m), weight 76.34 kg (168 lb 4.8 oz), SpO2 98 %. No results found. No results for input(s): WBC, HGB, HCT, PLT in the last 72 hours. No results for input(s): NA, K, CL, GLUCOSE, BUN, CREATININE, CALCIUM in the last 72 hours.  Invalid input(s): CO CBG (last 3)  No results for input(s): GLUCAP in the last 72 hours.  Wt Readings from Last 3 Encounters:  12/09/15 76.34 kg (168 lb 4.8 oz)  12/04/15 81.421 kg (179 lb 8 oz)  10/03/15 84.823 kg (187 lb)    Physical Exam:  BP 102/69 mmHg  Pulse 105  Temp(Src) 97.7 F (36.5 C) (Oral)  Resp 16  Ht 5\' 10"  (1.778 m)  Wt 76.34 kg (168 lb 4.8 oz)  BMI 24.15 kg/m2  SpO2 98% Constitutional: He appears well-developed and well-nourished. NAD. Vital signs reviewed. HENT: Normocephalic and atraumatic.  Mouth/Throat: Oropharynx is clear and moist. No oropharyngeal exudate.  Eyes: Conjunctivae and EOM are normal.   Neck: Normal range of motion. No tracheal deviation present. No thyromegaly present.  Cardiovascular: Normal rate and regular rhythm. Exam reveals no friction rub. No murmur heard. Respiratory: Effort normal. No respiratory distress.  GI: Soft. Bowel sounds are normal. He exhibits no distension. There is no tenderness. There is no rebound.  Musculoskeletal: He exhibits no edema or tenderness.  Neurological: He is alert and oriented Voice is stronger. Able to follow commands without difficulty B/l UE: motor 5/5 proximal to distal B/l LE: 4+/5 hip flexion, knee extension, 5/5 ankle dorsi/plantar flexion. Good insight and awareness. Attention reasonable  Skin: Skin is warm and  dry.  Psychiatric: He has a normal mood and affect. His behavior is normal.   Assessment/Plan: 1. Functional deficits secondary to debility, cognitive/swallowing dysfunction after cardiac arrest which require 3+ hours per day of interdisciplinary therapy in a comprehensive inpatient rehab setting. Physiatrist is providing close team supervision and 24 hour management of active medical problems listed below. Physiatrist and rehab team continue to assess barriers to discharge/monitor patient progress toward functional and medical goals.  Function:  Bathing Bathing position Bathing activity did not occur: Refused Position: Shower  Bathing parts Body parts bathed by patient: Right arm, Left arm, Chest, Abdomen, Front perineal area, Buttocks, Right upper leg, Left upper leg, Right lower leg, Left lower leg, Back    Bathing assist Assist Level: Supervision or verbal cues      Upper Body Dressing/Undressing Upper body dressing   What is the patient wearing?: Pull over shirt/dress     Pull over shirt/dress - Perfomed by patient: Thread/unthread right sleeve, Thread/unthread left sleeve, Put head through opening, Pull shirt over trunk          Upper body assist Assist Level: No help, No cues      Lower Body Dressing/Undressing Lower body dressing Lower body dressing/undressing activity did not occur: N/A What is the patient wearing?: Underwear, Pants, Socks, Shoes Underwear - Performed by patient: Thread/unthread right underwear leg, Thread/unthread left underwear leg, Pull underwear up/down   Pants- Performed by patient: Thread/unthread right pants leg, Thread/unthread left pants leg, Pull pants up/down  Socks - Performed by patient: Don/doff right sock, Don/doff left sock   Shoes - Performed by patient: Don/doff right shoe, Don/doff left shoe, Fasten right, Fasten left            Lower body assist Assist for lower body dressing: No Help, No cues       Toileting Toileting Toileting activity did not occur: Safety/medical concerns Toileting steps completed by patient: Adjust clothing prior to toileting, Performs perineal hygiene, Adjust clothing after toileting   Toileting Assistive Devices: Grab bar or rail  Toileting assist Assist level: Supervision or verbal cues   Transfers Chair/bed transfer   Chair/bed transfer method: Ambulatory Chair/bed transfer assist level: Supervision or verbal cues Chair/bed transfer assistive device: Environmental consultant, Air cabin crew     Max distance: 800 Assist level: Supervision or verbal cues   Wheelchair   Type: Manual Max wheelchair distance: 150 Assist Level: Supervision or verbal cues  Cognition Comprehension Comprehension assist level: Understands complex 90% of the time/cues 10% of the time  Expression Expression assist level: Expresses basic needs/ideas: With no assist  Social Interaction Social Interaction assist level: Interacts appropriately with others with medication or extra time (anti-anxiety, antidepressant).  Problem Solving Problem solving assist level: Solves basic 90% of the time/requires cueing < 10% of the time  Memory Memory assist level: Recognizes or recalls 90% of the time/requires cueing < 10% of the time    Medical Problem List and Plan: 1. Debility, dysphagia, secondary to secondary to cardiac arrest, anoxia  Cont CIR 2. DVT Prophylaxis/Anticoagulation: Pharmaceutical: Lovenox 3. Pain Management: tylenol prn for chest wall pain.  4. Mood: LCSW to follow for evaluation and support.  5. Neuropsych: This patient is capable of making decisions on her own behalf. 6. Skin/Wound Care: Routine pressure relief measures. Maintain adequate nutrition and hydration status.  7. Fluids/Electrolytes/Nutrition: Monitor I/O.   Eating well  BMP WNL on 12/23 8. RA: Methotrexate on hold--to follow up with Rhemumatology regarding resumption. No NSAIDS/Mobic per cards.   9. HTN: Monitor.   Stable 10. CAD with V fib arrest/STEMI: On ASA and Brillinta due to LAD DES as well as BB and statin 11. C diff colitis: Completed po flagyl and vancomycin D. Educated (and allayed fear) patient on need for ongoing precautions.   Diarrhea resolved 12. HCAP: Has completed aztreoman for treatment. Continues to have ongoing cough despite scheduled robitussin. Changed to Tussionex for 48 hours to help manage symptoms.  13. Acute systolic CHF: On metoprolol bid. Check daily weights. Low salt diet.  14. Chest wall pain: Educate on splinting when coughing to help with pain management.   Improving 15. Hypoalbuminemia: Supplementation started  LOS (Days) 5 A FACE TO FACE EVALUATION WAS PERFORMED  Ankit Lorie Phenix 12/09/2015 9:44 AM

## 2015-12-11 NOTE — Discharge Summary (Signed)
Physician Discharge Summary  Patient ID: [name] MRN: [id number] DOB/AGE: 1946-11-06 69 y.o.  Admit date: 12/04/2015 Discharge date: 12/09/2015  Discharge Diagnoses:  Principal Problem:   Physical debility Active Problems:   CAD S/P PCI OF pLAD with 3.5 mm x38 mm Promus DES (3.75 mm)   Enteritis due to Clostridium difficile   Hypoalbuminemia due to protein-calorie malnutrition ([name])   Chest wall pain   Discharged Condition: Stable.     Labs:  Basic Metabolic Panel:  Recent Labs Lab 12/05/15 0756  NA 141  K 3.9  CL 112*  CO2 21*  GLUCOSE 170*  BUN 10  CREATININE 0.88  CALCIUM 8.4*    CBC:  Recent Labs Lab 12/04/15 2013  WBC 6.1  HGB 12.4*  HCT 37.7*  MCV 95.2  PLT 275    CBG: No results for input(s): GLUCAP in the last 168 hours.  Brief HPI:   [name] is a 69 y.o. male with history of RA, impaired glucose tolerance, HTN who was admitted to [facility] on 11/11/15 with 2 week history of intermittent CP and witnessed cardiac arrest. Family initiated CPR, he was treated with ACLS protocol and EKG in field showed evidence of NSTEMI. He was unresponsive and intubated in ED and taken to cath lab emergently. Cardiac cath revealed occlusive LAD lesion which was treated with PCI and DES by Dr. Burt Knack and was treated with hypothermia protocol. Hospital course significant for respiratory failure due to HCAP as well as laryngeal edema post extubation 12/08 requiring reintubation on 12/13 due to increase in WOB and lethargy. Diarrhea due to c diff colitis treated with vanc and flagyl with improvement in leucocytosis. He tolerated extubation on 12/15 and required modified diet initially. Repeat MBS done 12/22 and patient was advanced to dysphagia 3, thin liquids. Patient with resultant weakness with unsteady gait and inability to complete ADL tasks. CIR recommended for follow up therapy   Hospital Course: [name] was admitted to rehab  12/04/2015 for inpatient therapies to consist of PT, ST and OT at least three hours five days a week. Past admission physiatrist, therapy team and rehab RN have worked together to provide customized collaborative inpatient rehab. Blood pressures were monitored bid and have been reasonably controlled. No complaints of chest pain or discomfort report with increase in activity level. He continued to have chest wall MS was improving and patient was educated on splinting when coughing to help manage symptoms. Follow up labs done revealing renal status to be stable and hypernatremia had resolved.  He was started on fiber-con and probiotics and diarrhea had resolved by discharge. He was maintained on flagyl and Vancomycin tor treatment of C- diff colitis and completed his treatment course by discharge.  Follow up CBC shows that leucocytosis has resolved and H/H is stable. Po intake had improved and pain was reasonably controlled. He was advised to follow up with rheumatology for input on resumption of Methotrexate. He made great progress during his rehab stay and was modified independent at discharge. he will continue to receive follow up HHPT by [place] after discharge.    Rehab course: During patient's stay in team conference was held to monitor patient's progress, set goals and discuss barriers to discharge. At admission, patient required min assist with ADL tasks and mobility. Speech therapy has focused on mild dysphagia as well as vocal quality and mild cognitive linguistic deficits. He has had improvement in activity tolerance, balance, postural control, as well as ability to  compensate for deficits. He is able to tolerate regular textures and is utilizing of safe swallow strategies independently.  Speech is 100% intelligible and family was educated on following up with ENT if dysphonia does not improve over next few weeks. He is modified independent for ADL tasks, transfers and ambulation. He is able  to ambulate 300' with 4 WW at modified independent level. He requires supervision with car transfers. Family education was done with wife regarding all aspects of mobility and care.    Disposition: 01-Home or Self Care   Diet: Soft diet.   Special Instructions: 1.  [facility] to provide PT and RN.         Discharge Instructions    Ambulatory referral to Physical Medicine Rehab    Complete by:  As directed   Follow-up 4-6 weeks for anoxic encephalopathy after cardiac arrest            Medication List    STOP taking these medications        Cetirizine HCl 10 MG Caps     meloxicam 7.5 MG tablet  Commonly known as:  MOBIC     nitroGLYCERIN 0.4 MG SL tablet  Commonly known as:  NITROSTAT     saw palmetto 160 MG capsule     SIMPONI ARIA IV     traMADol 50 MG tablet  Commonly known as:  ULTRAM     vancomycin 50 mg/mL oral solution  Commonly known as:  VANCOCIN      TAKE these medications        aspirin 81 MG chewable tablet  Chew 1 tablet (81 mg total) by mouth daily.     atorvastatin 80 MG tablet  Commonly known as:  LIPITOR  Take 1 tablet (80 mg total) by mouth daily at 6 PM.     Co Q 10 10 MG Caps  Take 10 mg by mouth daily.     famotidine 20 MG tablet  Commonly known as:  PEPCID  Take 1 tablet (20 mg total) by mouth 2 (two) times daily.     fexofenadine 30 MG tablet  Commonly known as:  ALLEGRA  Take 30 mg by mouth daily.     fluticasone 50 MCG/ACT nasal spray  Commonly known as:  FLONASE  USE 2 SPRAYS INTO NOSE DAILY     folic acid 1 MG tablet  Commonly known as:  FOLVITE  Take 1 mg by mouth daily.     HYDROcodone-acetaminophen 5-325 MG tablet  Commonly known as:  NORCO/VICODIN  Take 1-2 tablets by mouth every 6 (six) hours as needed for moderate pain.     loratadine 10 MG tablet  Commonly known as:  CLARITIN  Take 1 tablet (10 mg total) by mouth daily.     metoprolol tartrate 25 MG tablet  Commonly known as:  LOPRESSOR  Take 0.5  tablets (12.5 mg total) by mouth 2 (two) times daily.     polycarbophil 625 MG tablet  Commonly known as:  FIBERCON  Take 2 tablets (1,250 mg total) by mouth daily before breakfast.     PRO-BIOTIC BLEND PO  Take 1 tablet by mouth daily.     tamsulosin 0.4 MG Caps capsule  Commonly known as:  FLOMAX  Take 1 capsule (0.4 mg total) by mouth daily.     ticagrelor 90 MG Tabs tablet  Commonly known as:  BRILINTA  Take 1 tablet (90 mg total) by mouth 2 (two) times daily.  traZODone 50 MG tablet  Commonly known as:  DESYREL  Take 1 tablet (50 mg total) by mouth at bedtime.     Vitamin D-3 1000 units Caps  Take 2,000 Units by mouth daily.       Follow-up Information    Follow up with Meredith Staggers, MD.   Specialty:  Physical Medicine and Rehabilitation   Why:  Office to call for appointment   Contact information:   [address], [place] [address] [phone]       Follow up with Sherren Mocha, MD.   Specialty:  Cardiology   Why:  Call for appointment   Contact information:   [id number] [place] [address] [phone]       Schedule an appointment as soon as possible for a visit with [name], MD.   Specialty:  Internal Medicine   Why:  for hospital follow up appointment   Contact information:   [place] [address] [phone]       Signed: Bary Leriche 12/11/2015, 8:33 AM

## 2015-12-12 ENCOUNTER — Encounter: Payer: Self-pay | Admitting: Physical Medicine & Rehabilitation

## 2015-12-17 ENCOUNTER — Ambulatory Visit: Payer: BLUE CROSS/BLUE SHIELD | Admitting: Physician Assistant

## 2015-12-18 ENCOUNTER — Telehealth: Payer: Self-pay | Admitting: *Deleted

## 2015-12-18 NOTE — Telephone Encounter (Signed)
Calling to see if they have to come see Dr Naaman Plummer.  He is seeing cardiologist tomorrow and they are not clear on why they need to come.  No pain and having in home therapy at present.  I told her to discuss with Dr Burt Knack and let us know.  I will forward the fyi message to Dr Naaman Plummer.

## 2015-12-18 NOTE — Progress Notes (Signed)
Cardiology Office Note    Date:  12/18/2015   ID:  BROLY KLEPINGER, DOB 07-20-1946, MRN [id number]  PCP:  [name], MD  Cardiologist:  Dr. Glenetta Hew (patient request)  Electrophysiologist:  n/a  Chief Complaint  Patient presents with  . Hospitalization Follow-up    s/p VF arrest/STEMI >> PCI   . Coronary Artery Disease    History of Present Illness:  [name] is a 70 y.o. male with a hx of     Past Medical History  Diagnosis Date  . Rheumatoid arthritis(714.0) 1998  . Nephrolithiasis 1968  . Insomnia   . Allergic rhinitis   . Impaired glucose tolerance   . Hypertension   . ST elevation (STEMI) myocardial infarction involving left anterior descending coronary artery ([place]) 11/11/2015    100% LAD, Cardiogenic Shock. -- EF ~20-25%  . Cardiac arrest with ventricular fibrillation ([place]) 11/11/2015  . CAD S/P PCI OF pLAD with 3.5 mm x38 mm Promus DES (3.75 mm) 11/12/2015  . Cardiomyopathy, ischemic - EF 20-25% by LV Gram 11/12/2015    There is severe left ventricular systolic dysfunction. The left ventricular ejection fraction is 25-35% by visual estimate. There are wall motion abnormalities in the left ventricle. There are segmental wall motion abnormalities in the left ventricle. The anterolateral and periapical LV walls are akinetic with an estimated LVEF of 25%   . Pelvic fracture ([place]) 09/2015  . Fractured rib     Past Surgical History  Procedure Laterality Date  . Status post multiple lithotripy and urological surgery for reccurent calcium oxalate stones    . Cardiac catheterization N/A 11/11/2015    Procedure: Left Heart Cath and Cors/Grafts Angiography;  Surgeon: Sherren Mocha, MD;  Location: [facility];  Service: Cardiovascular;  Laterality: N/A;  . Cardiac catheterization N/A 11/11/2015    Procedure: Coronary Stent Intervention;  Surgeon: Sherren Mocha, MD;  Location: [facility];  Service: Cardiovascular;   Laterality: N/A;  prox lad 3.5x38 promus    Current Outpatient Prescriptions  Medication Sig Dispense Refill  . aspirin 81 MG chewable tablet Chew 1 tablet (81 mg total) by mouth daily.    Marland Kitchen atorvastatin (LIPITOR) 80 MG tablet Take 1 tablet (80 mg total) by mouth daily at 6 PM. 90 tablet 3  . Cholecalciferol (VITAMIN D-3) 1000 UNITS CAPS Take 2,000 Units by mouth daily.     . Coenzyme Q10 (CO Q 10) 10 MG CAPS Take 10 mg by mouth daily.    . famotidine (PEPCID) 20 MG tablet Take 1 tablet (20 mg total) by mouth 2 (two) times daily. 60 tablet 1  . fexofenadine (ALLEGRA) 30 MG tablet Take 30 mg by mouth daily.    . fluticasone (FLONASE) 50 MCG/ACT nasal spray USE 2 SPRAYS INTO NOSE DAILY 48 g 3  . folic acid (FOLVITE) 1 MG tablet Take 1 mg by mouth daily.      Marland Kitchen HYDROcodone-acetaminophen (NORCO/VICODIN) 5-325 MG tablet Take 1-2 tablets by mouth every 6 (six) hours as needed for moderate pain. 60 tablet 0  . loratadine (CLARITIN) 10 MG tablet Take 1 tablet (10 mg total) by mouth daily.    . metoprolol tartrate (LOPRESSOR) 25 MG tablet Take 0.5 tablets (12.5 mg total) by mouth 2 (two) times daily. 180 tablet 3  . polycarbophil (FIBERCON) 625 MG tablet Take 2 tablets (1,250 mg total) by mouth daily before breakfast. 30 tablet 1  . Probiotic Product (PRO-BIOTIC BLEND PO) Take 1 tablet by mouth  daily.    . tamsulosin (FLOMAX) 0.4 MG CAPS capsule Take 1 capsule (0.4 mg total) by mouth daily. 90 capsule 0  . ticagrelor (BRILINTA) 90 MG TABS tablet Take 1 tablet (90 mg total) by mouth 2 (two) times daily. 180 tablet 3  . traZODone (DESYREL) 50 MG tablet Take 1 tablet (50 mg total) by mouth at bedtime. 30 tablet 0   No current facility-administered medications for this visit.    Allergies:   Contrast media; Ibuprofen; Penicillins; and Sulfamethoxazole   Social History   Social History  . Marital Status: Married    Spouse Name: N/A  . Number of Children: N/A  . Years of Education: N/A   Social  History Main Topics  . 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 Smokeless tobacco: Never Used     Comment: quit 30 yrs ago  . Alcohol Use: No  . Drug Use: No  . Sexual Activity: Not on file   Other Topics Concern  . Not on file   Social History Narrative     Family History:  The patient's family history includes Cancer in his mother; Diabetes in his father.   ROS:   Please see the history of present illness.    ROS All other systems reviewed and are negative.   PHYSICAL EXAM:   VS:  There were no vitals taken for this visit.   GEN: Well nourished, well developed, in no acute distress HEENT: normal Neck: no JVD, no masses Cardiac: Normal S1/S2, RRR; no murmurs, rubs, or gallops, no edema;   carotid bruits,   Respiratory:  clear to auscultation bilaterally; no wheezing, rhonchi or rales GI: soft, nontender, nondistended, + BS MS: no deformity or atrophy Skin: warm and dry, no rash Neuro:  Bilateral strength equal, no focal deficits  Psych: Alert and oriented x 3, normal affect  Wt Readings from Last 3 Encounters:  12/09/15 168 lb 4.8 oz (76.34 kg)  12/04/15 179 lb 8 oz (81.421 kg)  10/03/15 187 lb (84.823 kg)      Studies/Labs Reviewed:   EKG:  EKG is  ordered today.  The ekg ordered today demonstrates   Recent Labs: 11/12/2015: TSH 0.653 11/29/2015: Magnesium 2.3 12/04/2015: Hemoglobin 12.4*; Platelets 275 12/05/2015: ALT 50; BUN 10; Creatinine, Ser 0.88; Potassium 3.9; Sodium 141   Recent Lipid Panel    Component Value Date/Time   TRIG 171* 11/24/2015 0319    Additional studies/ records that were reviewed today include:   Echo 12/2/216 EF 50-55%, dist ant and anterior septal HK  LHC 11/11/15 LAD prox 100% LCx mid 50% RCA mid 30% Anterolateral and periapical akinesis, EF 25% PCI: 3.5 x 38 mm Promus DES to LAD 1. Total occlusion of the proximal LAD, treated successfully with primary PCI (drug-eluting stent) 2. Nonobstructive LCx and RCA stenoses 3. Severe  segmental LV systolic dysfunction, LVEF 25% Recommendations: hypothermia protocol per CCM team, post-MI medical therapy and supportive care in CCU setting.   ASSESSMENT:    No diagnosis found.  PLAN:  In order of problems listed above:  1.    Medication Adjustments/Labs and Tests Ordered: Current medicines are reviewed at length with the patient today.  Concerns regarding medicines are outlined above.  Medication changes, Labs and Tests ordered today are listed below. There are no Patient Instructions on file for this visit.    Signed, Richardson Dopp, PA-C  12/18/2015 11:23 PM    [facility] [place], [place], [place]  [address] Phone: [phone]; Fax: [phone]  This encounter was created in error - please disregard.

## 2015-12-19 ENCOUNTER — Encounter: Payer: BLUE CROSS/BLUE SHIELD | Admitting: Physician Assistant

## 2015-12-19 NOTE — Telephone Encounter (Signed)
We can cancel his appointment if he's not having any needs. He was discharged while I was away---pam went ahead and made an appt

## 2015-12-19 NOTE — Telephone Encounter (Signed)
Wife Debbie notified. Appt canceled.

## 2015-12-21 NOTE — Progress Notes (Signed)
Cardiology Office Note    Date:  12/22/2015   ID:  [name], DOB Jun 10, 1946, MRN [id number]  PCP:  [name], MD  Cardiologist:  Dr. Sherren Mocha   Electrophysiologist:  n/a  Chief Complaint  Patient presents with  . Hospitalization Follow-up    s/p VF arrest/STEMI >> PCI   . Coronary Artery Disease    History of Present Illness:  [name] is a 70 y.o. male with a hx of HTN, rheumatoid arthritis.  Admitted 11/29-12/22 with out of hospital VF cardiac arrest in the setting of anterior STEMI. He arrested at home and this was witnessed by his wife who started CPR. He was defibrillated 2 by EMS via AED with ROSC. Emergent LHC demonstrated an occluded proximal LAD treated successfully with DES. EF was down at 25%. He was managed with therapeutic hypothermia protocol. Follow-up echo with improved ejection fraction at 50-55%. Hospital course was complicated by C. difficile colitis and recurrent hypoxic respiratory failure secondary to HCAP. Blood pressure remains soft. ACE inhibitor could not be started. He was discharged to inpatient rehabilitation. He remained in [facility] inpatient rehabilitation from 12/22-12/27.  Here with his wife. Overall doing well.  Working with PT at home.  Chest is still sore from CPR.  He denies dyspnea.  He denies orthopnea, PND, edema.  No syncope. Still weak but getting stronger.  Biggest complaint is continued non-productive cough.  No fever.     Past Medical History  Diagnosis Date  . Rheumatoid arthritis(714.0) 1998  . Nephrolithiasis 1968  . Insomnia   . Allergic rhinitis   . Impaired glucose tolerance   . Hypertension   . Pelvic fracture ([place]) 09/2015  . Fractured rib   . CAD (coronary artery disease)     a. 11/11/15 - OOH VF arrest 2/2 ant STEMI >> CPR//AED shock x 2 >> ROSC >> LHC: pLAD 100 (Promus DES), mLCx 50, mRCA 30, EF 25% [managed with hypothermia protocol]  . ST elevation (STEMI) myocardial infarction  involving left anterior descending coronary artery ([place]) 11/11/15    100% LAD, Cardiogenic Shock. -- EF ~20-25%  . Ischemic cardiomyopathy     a. EF [facility] 11/11/15 25%;  b. Echo 11/14/15: EF 50-55%, dist ant and anterior septal HK  . History of Clostridium difficile colitis 10/2015    post MI >> required IV Flagyl and IV Vanc >> DC on po Vanc  . HLD (hyperlipidemia)     Past Surgical History  Procedure Laterality Date  . Status post multiple lithotripy and urological surgery for reccurent calcium oxalate stones    . Cardiac catheterization N/A 11/11/2015    Procedure: Left Heart Cath and Cors/Grafts Angiography;  Surgeon: Sherren Mocha, MD;  Location: [facility];  Service: Cardiovascular;  Laterality: N/A;  . Cardiac catheterization N/A 11/11/2015    Procedure: Coronary Stent Intervention;  Surgeon: Sherren Mocha, MD;  Location: [facility];  Service: Cardiovascular;  Laterality: N/A;  prox lad 3.5x38 promus    Current Outpatient Prescriptions  Medication Sig Dispense Refill  . aspirin 81 MG chewable tablet Chew 1 tablet (81 mg total) by mouth daily.    Marland Kitchen atorvastatin (LIPITOR) 80 MG tablet Take 1 tablet (80 mg total) by mouth daily at 6 PM. 90 tablet 3  . cetirizine (ZYRTEC) 10 MG tablet Take 10 mg by mouth daily.    . Cholecalciferol (VITAMIN D-3) 1000 UNITS CAPS Take 2,000 Units by mouth daily.     . Coenzyme  Q10 (CO Q 10) 10 MG CAPS Take 10 mg by mouth daily.    . famotidine (PEPCID) 20 MG tablet Take 1 tablet (20 mg total) by mouth 2 (two) times daily. 60 tablet 1  . fluticasone (FLONASE) 50 MCG/ACT nasal spray USE 2 SPRAYS INTO NOSE DAILY 48 g 3  . folic acid (FOLVITE) 1 MG tablet Take 1 mg by mouth daily.      Marland Kitchen HYDROcodone-acetaminophen (NORCO/VICODIN) 5-325 MG tablet Take 1-2 tablets by mouth every 6 (six) hours as needed for moderate pain. 60 tablet 0  . methotrexate 2.5 MG tablet Take 15 mg by mouth once a week.    . metoprolol tartrate (LOPRESSOR) 25 MG  tablet Take 0.5 tablets (12.5 mg total) by mouth 2 (two) times daily. 180 tablet 3  . nitroGLYCERIN (NITROSTAT) 0.4 MG SL tablet Place 1 tablet under the tongue as needed. Every 5 minutes for chest pain  3  . polycarbophil (FIBERCON) 625 MG tablet Take 2 tablets (1,250 mg total) by mouth daily before breakfast. 30 tablet 1  . Probiotic Product (PRO-BIOTIC BLEND PO) Take 1 tablet by mouth daily.    . tamsulosin (FLOMAX) 0.4 MG CAPS capsule Take 1 capsule (0.4 mg total) by mouth daily. 90 capsule 0  . ticagrelor (BRILINTA) 90 MG TABS tablet Take 1 tablet (90 mg total) by mouth 2 (two) times daily. 180 tablet 3  . traMADol (ULTRAM) 50 MG tablet Take 50 mg by mouth every 6 (six) hours as needed for moderate pain.    . traZODone (DESYREL) 50 MG tablet Take 1 tablet (50 mg total) by mouth at bedtime. 30 tablet 0  . benzonatate (TESSALON) 100 MG capsule Take 1 capsule (100 mg total) by mouth 3 (three) times daily as needed for cough. 20 capsule 0   No current facility-administered medications for this visit.    Allergies:   Contrast media; Ibuprofen; Penicillins; and Sulfamethoxazole   Social History   Social History  . Marital Status: Married    Spouse Name: N/A  . Number of Children: N/A  . Years of Education: N/A   Social History Main Topics  . 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 Smokeless tobacco: Never Used     Comment: quit 30 yrs ago  . Alcohol Use: No  . Drug Use: No  . Sexual Activity: Not Asked   Other Topics Concern  . None   Social History Narrative     Family History:  The patient's family history includes Cancer in his mother; Diabetes in his father.   ROS:   Please see the history of present illness.    Review of Systems  Cardiovascular: Positive for chest pain.  Respiratory: Positive for cough.   All other systems reviewed and are negative.   PHYSICAL EXAM:   VS:  BP 128/64 mmHg  Pulse 90  Ht 5\' 9"  (1.753 m)  Wt 175 lb 12.8 oz (79.742 kg)  BMI 25.95 kg/m2     GEN: Well nourished, well developed, in no acute distress HEENT: normal Neck: no JVD, no masses Cardiac: Normal S1/S2, RRR; no murmurs, rubs, or gallops, no edema;  right wrist without hematoma or mass    Respiratory:  clear to auscultation bilaterally; no wheezing, rhonchi or rales GI: soft, nontender, nondistended,  MS: no deformity or atrophy Skin: warm and dry, no rash Neuro:    no focal deficits  Psych: Alert and oriented x 3, normal affect  Wt Readings from Last 3 Encounters:  12/22/15 175  lb 12.8 oz (79.742 kg)  12/09/15 168 lb 4.8 oz (76.34 kg)  12/04/15 179 lb 8 oz (81.421 kg)      Studies/Labs Reviewed:   EKG:  EKG is ordered today.  The ekg ordered today demonstrates NSR, HR 89, normal axis, T-wave inversions V4-V5, QTc 438 ms  Recent Labs: 11/12/2015: TSH 0.653 11/29/2015: Magnesium 2.3 12/04/2015: Hemoglobin 12.4*; Platelets 275 12/05/2015: ALT 50; BUN 10; Creatinine, Ser 0.88; Potassium 3.9; Sodium 141   Recent Lipid Panel    Component Value Date/Time   TRIG 171* 11/24/2015 0319    Additional studies/ records that were reviewed today include:   Echo 12/2/216 EF 50-55%, dist ant and anterior septal HK  LHC 11/11/15 LAD prox 100% LCx mid 50% RCA mid 30% Anterolateral and periapical akinesis, EF 25% PCI: 3.5 x 38 mm Promus DES to LAD 1. Total occlusion of the proximal LAD, treated successfully with primary PCI (drug-eluting stent) 2. Nonobstructive LCx and RCA stenoses 3. Severe segmental LV systolic dysfunction, LVEF 25% Recommendations: hypothermia protocol per CCM team, post-MI medical therapy and supportive care in CCU setting.    ASSESSMENT:    1. CAD S/P PCI OF pLAD with 3.5 mm x38 mm Promus DES (3.75 mm)   2. Cardiomyopathy, ischemic - EF 20-25% by LV Gram   3. Essential hypertension   4. Hyperlipidemia   5. Cough   6. Insomnia     PLAN:  In order of problems listed above:  1. CAD - s/p OOH VF arrest in setting of anterior STEMI tx  with DES to the LAD.  He spent 1 week in inpatient rehab.  Hospital course was c/b HCAP and CDiff.  Overall progressing well.  I spent 50 minutes with the patient and his wife today discussing his care, answering questions and making recommendations.  Face to face time > 50%.  We discussed the importance of dual antiplatelet therapy.   -  Continue ASA, Brilinta, statin, beta-blocker   -  Refer to Cardiac Rehab  2. Ischemic CM - EF recovered to normal post PCI.  Continue beta-blocker.  BP remains too soft to start ACE inhibitor. I do not think his BP will currently tolerate increasing the beta-blocker.    3. HTN -  Controlled.   4. HL - Continue statin.  Arrange FU Lipids and LFTs.   5. Cough - Likely related to residual tracheal edema from intubation x 2.  Lung exam clear.  Reassurance that this will eventually resolve.   -  Rx for Tessalon Perles 100 mg TID prn #20, 1 R  6. Insomnia - He may resume Trazodone.  There are some precautions for patients with hx of MI.  His QTc is ok.  He was DC on 50 mg but used to take 100 mg. He can resume to 100 mg if he needs to since he has been on this drug for years.  However, I asked him to review with his PCP to see if there are any alternatives.      Signed, Richardson Dopp, PA-C  12/22/2015 1:14 PM    [facility] [place], [place], [place]  [address] Phone: [phone]; Fax: [phone]     Medication Adjustments/Labs and Tests Ordered: Current medicines are reviewed at length with the patient today.  Concerns regarding medicines are outlined above.  Medication changes, Labs and Tests ordered today are outlined in the patient instructions below. Patient Instructions   Medication Instructions:   1. START  TESSALON PEARLES 100 MG EVERY 8 HOURS AS NEEDED FOR COUGH  2. If you need to increase your Trazodone to 100 mg each night, you can. But, please review this with your primary care doctor to see if there are any other  alternatives you might be able to take.  3. If you feel you need to take Coenzyme Q10 or Saw Palmetto, that is ok from a cardiac standpoint.   Labwork: FASTING LIPID AND LIVER PANEL TO BE DONE IN 4-6 WEEKS  Testing/Procedures: NONE  Follow-Up: DR. Burt Knack IN 2 MONTHS  Any Other Special Instructions Will Be Listed Below (If Applicable). 1. CARDIAC REHAB AT [facility]  2. PER Jeronica Stlouis, PAC YOU CAN START WHEN YOU FEEL UP TO IT   If you need a refill on your cardiac medications before your next appointment, please call your pharmacy.

## 2015-12-22 ENCOUNTER — Encounter: Payer: Self-pay | Admitting: Physician Assistant

## 2015-12-22 ENCOUNTER — Ambulatory Visit (INDEPENDENT_AMBULATORY_CARE_PROVIDER_SITE_OTHER): Payer: BLUE CROSS/BLUE SHIELD | Admitting: Physician Assistant

## 2015-12-22 VITALS — BP 128/64 | HR 90 | Ht 69.0 in | Wt 175.8 lb

## 2015-12-22 DIAGNOSIS — Z9861 Coronary angioplasty status: Secondary | ICD-10-CM

## 2015-12-22 DIAGNOSIS — I2510 Atherosclerotic heart disease of native coronary artery without angina pectoris: Secondary | ICD-10-CM

## 2015-12-22 DIAGNOSIS — E785 Hyperlipidemia, unspecified: Secondary | ICD-10-CM | POA: Insufficient documentation

## 2015-12-22 DIAGNOSIS — I10 Essential (primary) hypertension: Secondary | ICD-10-CM

## 2015-12-22 DIAGNOSIS — R05 Cough: Secondary | ICD-10-CM

## 2015-12-22 DIAGNOSIS — E1169 Type 2 diabetes mellitus with other specified complication: Secondary | ICD-10-CM | POA: Insufficient documentation

## 2015-12-22 DIAGNOSIS — I255 Ischemic cardiomyopathy: Secondary | ICD-10-CM

## 2015-12-22 DIAGNOSIS — R059 Cough, unspecified: Secondary | ICD-10-CM

## 2015-12-22 DIAGNOSIS — G4700 Insomnia, unspecified: Secondary | ICD-10-CM

## 2015-12-22 MED ORDER — BENZONATATE 100 MG PO CAPS: 100.0000 mg | ORAL_CAPSULE | Freq: Three times a day (TID) | ORAL | Status: DC | PRN

## 2015-12-22 NOTE — Patient Instructions (Addendum)
  Medication Instructions:   1. START TESSALON PEARLES 100 MG EVERY 8 HOURS AS NEEDED FOR COUGH  2. If you need to increase your Trazodone to 100 mg each night, you can. But, please review this with your primary care doctor to see if there are any other alternatives you might be able to take.  3. If you feel you need to take Coenzyme Q10 or Saw Palmetto, that is ok from a cardiac standpoint.   Labwork: FASTING LIPID AND LIVER PANEL TO BE DONE IN 4-6 WEEKS  Testing/Procedures: NONE  Follow-Up: DR. Burt Knack IN 2 MONTHS  Any Other Special Instructions Will Be Listed Below (If Applicable). 1. CARDIAC REHAB AT [place]  2. PER Aubriella Perezgarcia, PAC YOU CAN START WHEN YOU FEEL UP TO IT   If you need a refill on your cardiac medications before your next appointment, please call your pharmacy.

## 2015-12-25 ENCOUNTER — Encounter: Payer: Self-pay | Admitting: Cardiovascular Disease

## 2015-12-25 NOTE — Telephone Encounter (Signed)
New Message  Pt wife calling- stated she was to f/u w/ RN about seeing Dr Burt Knack- in [place]. Please call back and discuss.

## 2015-12-25 NOTE — Telephone Encounter (Signed)
This encounter was created in error - please disregard.

## 2015-12-28 ENCOUNTER — Other Ambulatory Visit: Payer: Self-pay | Admitting: Physician Assistant

## 2015-12-29 ENCOUNTER — Other Ambulatory Visit: Payer: Self-pay | Admitting: *Deleted

## 2015-12-29 ENCOUNTER — Telehealth: Payer: Self-pay | Admitting: Physician Assistant

## 2015-12-29 DIAGNOSIS — R059 Cough, unspecified: Secondary | ICD-10-CM

## 2015-12-29 DIAGNOSIS — R05 Cough: Secondary | ICD-10-CM

## 2015-12-29 MED ORDER — FAMOTIDINE 20 MG PO TABS: 20.0000 mg | ORAL_TABLET | Freq: Two times a day (BID) | ORAL | Status: AC

## 2015-12-29 MED ORDER — TICAGRELOR 90 MG PO TABS: 90.0000 mg | ORAL_TABLET | Freq: Two times a day (BID) | ORAL | Status: DC

## 2015-12-29 NOTE — Telephone Encounter (Signed)
Patient is needing a refill on tessalon pearls. It was ordered 20 with no refills. Will forward to [employer] PA for refill order.

## 2015-12-29 NOTE — Telephone Encounter (Signed)
Please review for refill, Thank you. 

## 2015-12-29 NOTE — Telephone Encounter (Signed)
Pt's wife calling with a couple questions from last visit with Richardson Dopp pls call

## 2015-12-29 NOTE — Telephone Encounter (Signed)
Please review for refill. Patient needs a follow up with Dr. Burt Knack.

## 2015-12-29 NOTE — Telephone Encounter (Signed)
Ok to refill 

## 2015-12-30 MED ORDER — BENZONATATE 100 MG PO CAPS
100.0000 mg | ORAL_CAPSULE | Freq: Three times a day (TID) | ORAL | Status: DC | PRN
Start: 2015-12-30 — End: 2016-02-06

## 2015-12-30 NOTE — Telephone Encounter (Signed)
Liliane Shi, PA-C  Barkley Boards, RN           That is ok.  I sent to Arbie Cookey to have her take care of it.  Thanks  Event organiser       Previous Messages     ----- Message -----   From: Barkley Boards, RN   Sent: 12/30/2015  8:26 AM    To: Liliane Shi, PA-C  Subject: pt is requesting a refill on Tessalon       It looks like you gave him a script at 1/9 office visit and a refill. The pharmacy is now requesting additional refills. Do you want to authorize?   Lauren           12/30/15 Refill sent to the pharmacy with #20 and one additional refill.

## 2015-12-30 NOTE — Telephone Encounter (Signed)
Ok to refill with 1 additional refill. If cough persists beyond this, he will need FU with PCP or Pulmonology. Richardson Dopp, PA-C   12/30/2015 11:15 AM

## 2015-12-30 NOTE — Telephone Encounter (Signed)
I s/w pt's wife (DPR) ok per Brynda Rim. PA to take the OTC Pepcid.

## 2015-12-30 NOTE — Telephone Encounter (Signed)
Ok to take OTC Famotidine FU with PCP to see if he needs to remain on long term Richardson Dopp, PA-C   12/30/2015 11:27 AM

## 2015-12-30 NOTE — Telephone Encounter (Signed)
DPR for wife. I advised pt's wife Lavella Lemons Rx has been sent to [facility] today # 20 x 1 refill. Wife asked did pt need to stay on Pepcid 20 mg BID and if so can he do OTC. I advised stay on Pepcid, and I though the OTC would be fine. I will d/w Brynda Rim. PA for any further recommendations. Wife was agreeable to plan of care. Told her I will let her know if Nicki Reaper has anything new about the pepcid.

## 2016-01-01 ENCOUNTER — Encounter (HOSPITAL_COMMUNITY)
Admission: RE | Admit: 2016-01-01 | Discharge: 2016-01-01 | Disposition: A | Discharge status: Home / Self Care | Payer: BLUE CROSS/BLUE SHIELD | Source: Ambulatory Visit | Attending: Cardiovascular Disease | Admitting: Cardiovascular Disease

## 2016-01-01 DIAGNOSIS — I213 ST elevation (STEMI) myocardial infarction of unspecified site: Secondary | ICD-10-CM | POA: Insufficient documentation

## 2016-01-01 DIAGNOSIS — Z955 Presence of coronary angioplasty implant and graft: Secondary | ICD-10-CM | POA: Insufficient documentation

## 2016-01-01 NOTE — Addendum Note (Signed)
Encounter addended by: Eleshia Wooley D Iva Montelongo on: 01/01/2016  9:08 AM<BR>     Documentation filed: Vitals Section

## 2016-01-01 NOTE — Progress Notes (Signed)
Cardiac Rehab Medication Review by a Pharmacist  Does the patient  feel that his/her medications are working for him/her?  yes  Has the patient been experiencing any side effects to the medications prescribed?  no  Does the patient measure his/her own blood pressure or blood glucose at home?  yes   Does the patient have any problems obtaining medications due to transportation or finances?   no  Understanding of regimen: excellent Understanding of indications: fair Potential of compliance: good   Pharmacist comments: Reviewed all indications of new medications, side effects, importance of compliance. Encouraged continuing to check and record BP at home. Answered all questions pt and wife had.  Governor Specking, PharmD Clinical Pharmacy Resident Pager: [phone] 01/01/2016 8:31 AM

## 2016-01-05 ENCOUNTER — Encounter (HOSPITAL_COMMUNITY)
Admission: RE | Admit: 2016-01-05 | Discharge: 2016-01-05 | Disposition: A | Discharge status: Home / Self Care | Payer: BLUE CROSS/BLUE SHIELD | Source: Ambulatory Visit | Attending: Cardiovascular Disease | Admitting: Cardiovascular Disease

## 2016-01-05 DIAGNOSIS — Z955 Presence of coronary angioplasty implant and graft: Secondary | ICD-10-CM | POA: Diagnosis not present

## 2016-01-05 DIAGNOSIS — I213 ST elevation (STEMI) myocardial infarction of unspecified site: Secondary | ICD-10-CM | POA: Diagnosis not present

## 2016-01-06 NOTE — Progress Notes (Signed)
Pt started cardiac rehab yesterday.  Pt tolerated light exercise without difficulty. VSS, telemetry-normal sinus rhythm, asymptomatic.  Medication list reconciled.  Pt verbalized compliance with medications and denies barriers to compliance. PSYCHOSOCIAL ASSESSMENT:  PHQ-0. Pt exhibits positive coping skills, hopeful outlook with supportive family. No psychosocial needs identified at this time, no psychosocial interventions necessary.  Patient's wife accompanied patient to exercise yesterday and given emotional support  Pt enjoys kayaking, woodworking and fishing .   Pt cardiac rehab  goal is  to improving strength and stamina.  Pt encouraged to participate in exercising on your own and functional fitness  to increase ability to achieve these goals.   Pt long term cardiac rehab goal is improving muscle tone.  Pt oriented to exercise equipment and routine.  Understanding verbalized. Will continue to monitor the patient throughout  the program.

## 2016-01-07 ENCOUNTER — Encounter (HOSPITAL_COMMUNITY)
Admission: RE | Admit: 2016-01-07 | Discharge: 2016-01-07 | Disposition: A | Discharge status: Home / Self Care | Payer: BLUE CROSS/BLUE SHIELD | Source: Ambulatory Visit | Attending: Cardiovascular Disease | Admitting: Cardiovascular Disease

## 2016-01-07 DIAGNOSIS — Z955 Presence of coronary angioplasty implant and graft: Secondary | ICD-10-CM | POA: Diagnosis not present

## 2016-01-07 DIAGNOSIS — I213 ST elevation (STEMI) myocardial infarction of unspecified site: Secondary | ICD-10-CM | POA: Diagnosis not present

## 2016-01-09 ENCOUNTER — Encounter (HOSPITAL_COMMUNITY)
Admission: RE | Admit: 2016-01-09 | Discharge: 2016-01-09 | Disposition: A | Discharge status: Home / Self Care | Payer: BLUE CROSS/BLUE SHIELD | Source: Ambulatory Visit | Attending: Cardiovascular Disease | Admitting: Cardiovascular Disease

## 2016-01-09 DIAGNOSIS — Z955 Presence of coronary angioplasty implant and graft: Secondary | ICD-10-CM | POA: Diagnosis not present

## 2016-01-09 DIAGNOSIS — I213 ST elevation (STEMI) myocardial infarction of unspecified site: Secondary | ICD-10-CM | POA: Diagnosis not present

## 2016-01-12 ENCOUNTER — Encounter (HOSPITAL_COMMUNITY)
Admission: RE | Admit: 2016-01-12 | Discharge: 2016-01-12 | Disposition: A | Discharge status: Home / Self Care | Payer: BLUE CROSS/BLUE SHIELD | Source: Ambulatory Visit | Attending: Cardiovascular Disease | Admitting: Cardiovascular Disease

## 2016-01-12 ENCOUNTER — Inpatient Hospital Stay: Payer: BLUE CROSS/BLUE SHIELD | Admitting: Physical Medicine & Rehabilitation

## 2016-01-12 DIAGNOSIS — Z955 Presence of coronary angioplasty implant and graft: Secondary | ICD-10-CM | POA: Diagnosis not present

## 2016-01-12 DIAGNOSIS — I213 ST elevation (STEMI) myocardial infarction of unspecified site: Secondary | ICD-10-CM | POA: Diagnosis not present

## 2016-01-14 ENCOUNTER — Encounter (HOSPITAL_COMMUNITY)
Admission: RE | Admit: 2016-01-14 | Discharge: 2016-01-14 | Disposition: A | Discharge status: Home / Self Care | Payer: BLUE CROSS/BLUE SHIELD | Source: Ambulatory Visit | Attending: Cardiovascular Disease | Admitting: Cardiovascular Disease

## 2016-01-14 DIAGNOSIS — I213 ST elevation (STEMI) myocardial infarction of unspecified site: Secondary | ICD-10-CM | POA: Diagnosis not present

## 2016-01-14 DIAGNOSIS — Z955 Presence of coronary angioplasty implant and graft: Secondary | ICD-10-CM | POA: Insufficient documentation

## 2016-01-16 ENCOUNTER — Encounter (HOSPITAL_COMMUNITY)
Admission: RE | Admit: 2016-01-16 | Discharge: 2016-01-16 | Disposition: A | Discharge status: Home / Self Care | Payer: BLUE CROSS/BLUE SHIELD | Source: Ambulatory Visit | Attending: Cardiovascular Disease | Admitting: Cardiovascular Disease

## 2016-01-16 DIAGNOSIS — Z955 Presence of coronary angioplasty implant and graft: Secondary | ICD-10-CM | POA: Diagnosis not present

## 2016-01-16 DIAGNOSIS — I213 ST elevation (STEMI) myocardial infarction of unspecified site: Secondary | ICD-10-CM | POA: Diagnosis not present

## 2016-01-19 ENCOUNTER — Encounter (HOSPITAL_COMMUNITY)
Admission: RE | Admit: 2016-01-19 | Discharge: 2016-01-19 | Disposition: A | Discharge status: Home / Self Care | Payer: BLUE CROSS/BLUE SHIELD | Source: Ambulatory Visit | Attending: Cardiovascular Disease | Admitting: Cardiovascular Disease

## 2016-01-19 DIAGNOSIS — Z955 Presence of coronary angioplasty implant and graft: Secondary | ICD-10-CM | POA: Diagnosis not present

## 2016-01-19 DIAGNOSIS — I213 ST elevation (STEMI) myocardial infarction of unspecified site: Secondary | ICD-10-CM | POA: Diagnosis not present

## 2016-01-21 ENCOUNTER — Encounter (HOSPITAL_COMMUNITY)
Admission: RE | Admit: 2016-01-21 | Discharge: 2016-01-21 | Disposition: A | Discharge status: Home / Self Care | Payer: BLUE CROSS/BLUE SHIELD | Source: Ambulatory Visit | Attending: Cardiovascular Disease | Admitting: Cardiovascular Disease

## 2016-01-21 DIAGNOSIS — I213 ST elevation (STEMI) myocardial infarction of unspecified site: Secondary | ICD-10-CM | POA: Diagnosis not present

## 2016-01-21 DIAGNOSIS — Z955 Presence of coronary angioplasty implant and graft: Secondary | ICD-10-CM | POA: Diagnosis not present

## 2016-01-21 NOTE — Progress Notes (Signed)
Reviewed home exercise with pt on 01/19/16.  Pt plans to walk and do physical therapy routine for exercise.  Reviewed THR, pulse, RPE, sign and symptoms, NTG use, and when to call 911 or MD.  Pt voiced understanding.   Makesha Belitz [employer]

## 2016-01-23 ENCOUNTER — Encounter (HOSPITAL_COMMUNITY)
Admission: RE | Admit: 2016-01-23 | Discharge: 2016-01-23 | Disposition: A | Discharge status: Home / Self Care | Payer: BLUE CROSS/BLUE SHIELD | Source: Ambulatory Visit | Attending: Cardiovascular Disease | Admitting: Cardiovascular Disease

## 2016-01-23 DIAGNOSIS — Z955 Presence of coronary angioplasty implant and graft: Secondary | ICD-10-CM | POA: Diagnosis not present

## 2016-01-23 DIAGNOSIS — I213 ST elevation (STEMI) myocardial infarction of unspecified site: Secondary | ICD-10-CM | POA: Diagnosis not present

## 2016-01-23 DIAGNOSIS — M0589 Other rheumatoid arthritis with rheumatoid factor of multiple sites: Secondary | ICD-10-CM | POA: Diagnosis not present

## 2016-01-23 NOTE — Progress Notes (Signed)
QUALITY OF LIFE SCORE REVIEW  Pt completed Quality of Life survey as a participant in Cardiac Rehab.  Pt quality of life scores are excellent.   Pt does admit he is overwhelmed with his recent cardiac event and filled with gratitude for the blessing of life.  Pt offered emotional support and reassurance.  Will continue to monitor and intervene as necessary.

## 2016-01-26 ENCOUNTER — Other Ambulatory Visit: Payer: BLUE CROSS/BLUE SHIELD

## 2016-01-26 ENCOUNTER — Encounter (HOSPITAL_COMMUNITY)
Admission: RE | Admit: 2016-01-26 | Discharge: 2016-01-26 | Disposition: A | Discharge status: Home / Self Care | Payer: BLUE CROSS/BLUE SHIELD | Source: Ambulatory Visit | Attending: Cardiovascular Disease | Admitting: Cardiovascular Disease

## 2016-01-26 DIAGNOSIS — I213 ST elevation (STEMI) myocardial infarction of unspecified site: Secondary | ICD-10-CM | POA: Diagnosis not present

## 2016-01-26 DIAGNOSIS — Z955 Presence of coronary angioplasty implant and graft: Secondary | ICD-10-CM | POA: Diagnosis not present

## 2016-01-26 NOTE — Progress Notes (Signed)
[name] 70 y.o. male Nutrition Note Spoke with pt. Nutrition Plan and Nutrition Survey goals reviewed with pt. Pt is following Step 2 of the Therapeutic Lifestyle Changes diet. Pt reported he wanted to lose wt. Per discussion, pt lost 10 lb from his pre-hospital wt of 186 lb. Pt now wants to maintain his wt around 175 lb. Pt with glucose intolerance according to his last A1c. Pt was unaware of elevated A1c. Pt states he has been borderline in the past "but I was never told there was anything to be concerned about." Pt reports his dad had DM. Pt encouraged to discuss A1c and further testing with his PCP. Pt expressed understanding of the information reviewed. Pt aware of nutrition education classes offered and plans on attending nutrition classes.  Lab Results  Component Value Date   HGBA1C 6.6* 11/12/2015   Wt Readings from Last 3 Encounters:  01/01/16 176 lb 12.9 oz (80.2 kg)  12/22/15 175 lb 12.8 oz (79.742 kg)  12/09/15 168 lb 4.8 oz (76.34 kg)    Nutrition Diagnosis ? Food-and nutrition-related knowledge deficit related to lack of exposure to information as related to diagnosis of: ? CVD ? Glucose Intolerance  Nutrition Intervention ? Pt's individual nutrition plan reviewed with pt. ? Benefits of adopting Therapeutic Lifestyle Changes discussed when Medficts reviewed. ? Pt to attend the Portion Distortion class ? Pt to attend the Diabetes Q & A class ? Pt to attend the   ? Nutrition I class                     ? Nutrition II class ? Pt given handouts for: ? pre-diabetes ? Continue client-centered nutrition education by RD, as part of interdisciplinary care. Goal(s) ? Pt to describe the benefit of including fruits, vegetables, whole grains, and low-fat dairy products in a heart healthy meal plan. ? Pt able to name foods that affect blood glucose  Monitor and Evaluate progress toward nutrition goal with team. Derek Mound, M.Ed, RD, LDN, CDE 01/26/2016 3:48 PM

## 2016-01-27 ENCOUNTER — Other Ambulatory Visit: Payer: BLUE CROSS/BLUE SHIELD

## 2016-01-28 ENCOUNTER — Encounter (HOSPITAL_COMMUNITY)
Admission: RE | Admit: 2016-01-28 | Discharge: 2016-01-28 | Disposition: A | Discharge status: Home / Self Care | Payer: BLUE CROSS/BLUE SHIELD | Source: Ambulatory Visit | Attending: Cardiovascular Disease | Admitting: Cardiovascular Disease

## 2016-01-28 DIAGNOSIS — Z955 Presence of coronary angioplasty implant and graft: Secondary | ICD-10-CM | POA: Diagnosis not present

## 2016-01-28 DIAGNOSIS — I213 ST elevation (STEMI) myocardial infarction of unspecified site: Secondary | ICD-10-CM | POA: Diagnosis not present

## 2016-01-29 ENCOUNTER — Other Ambulatory Visit (INDEPENDENT_AMBULATORY_CARE_PROVIDER_SITE_OTHER): Payer: BLUE CROSS/BLUE SHIELD | Admitting: *Deleted

## 2016-01-29 DIAGNOSIS — E870 Hyperosmolality and hypernatremia: Secondary | ICD-10-CM

## 2016-01-29 DIAGNOSIS — D72829 Elevated white blood cell count, unspecified: Secondary | ICD-10-CM

## 2016-01-29 DIAGNOSIS — E785 Hyperlipidemia, unspecified: Secondary | ICD-10-CM | POA: Diagnosis not present

## 2016-01-29 LAB — HEPATIC FUNCTION PANEL
ALBUMIN: 4.2 g/dL (ref 3.6–5.1)
ALK PHOS: 141 U/L — AB (ref 40–115)
ALT: 28 U/L (ref 9–46)
AST: 20 U/L (ref 10–35)
BILIRUBIN DIRECT: 0.2 mg/dL (ref <=0.2)
BILIRUBIN INDIRECT: 0.4 mg/dL (ref 0.2–1.2)
BILIRUBIN TOTAL: 0.6 mg/dL (ref 0.2–1.2)
Total Protein: 7.1 g/dL (ref 6.1–8.1)

## 2016-01-29 LAB — BASIC METABOLIC PANEL
BUN: 13 mg/dL (ref 7–25)
CALCIUM: 9.6 mg/dL (ref 8.6–10.3)
CO2: 24 mmol/L (ref 20–31)
Chloride: 106 mmol/L (ref 98–110)
Creat: 0.93 mg/dL (ref 0.70–1.25)
GLUCOSE: 152 mg/dL — AB (ref 65–99)
Potassium: 3.6 mmol/L (ref 3.5–5.3)
SODIUM: 138 mmol/L (ref 135–146)

## 2016-01-29 LAB — CBC WITH DIFFERENTIAL/PLATELET
BASOS PCT: 0 % (ref 0–1)
Basophils Absolute: 0 10*3/uL (ref 0.0–0.1)
Eosinophils Absolute: 0.1 10*3/uL (ref 0.0–0.7)
Eosinophils Relative: 2 % (ref 0–5)
HEMATOCRIT: 43.4 % (ref 39.0–52.0)
HEMOGLOBIN: 14.7 g/dL (ref 13.0–17.0)
LYMPHS ABS: 0.9 10*3/uL (ref 0.7–4.0)
LYMPHS PCT: 14 % (ref 12–46)
MCH: 31.1 pg (ref 26.0–34.0)
MCHC: 33.9 g/dL (ref 30.0–36.0)
MCV: 91.8 fL (ref 78.0–100.0)
MONO ABS: 0.4 10*3/uL (ref 0.1–1.0)
MONOS PCT: 7 % (ref 3–12)
MPV: 9.7 fL (ref 8.6–12.4)
NEUTROS ABS: 4.8 10*3/uL (ref 1.7–7.7)
NEUTROS PCT: 77 % (ref 43–77)
Platelets: 220 10*3/uL (ref 150–400)
RBC: 4.73 MIL/uL (ref 4.22–5.81)
RDW: 14.5 % (ref 11.5–15.5)
WBC: 6.2 10*3/uL (ref 4.0–10.5)

## 2016-01-29 LAB — LIPID PANEL
Cholesterol: 111 mg/dL — ABNORMAL LOW (ref 125–200)
HDL: 27 mg/dL — ABNORMAL LOW (ref >=40)
LDL CALC: 59 mg/dL (ref <130)
Total CHOL/HDL Ratio: 4.1 Ratio (ref <=5.0)
Triglycerides: 124 mg/dL (ref <150)
VLDL: 25 mg/dL (ref <30)

## 2016-01-29 NOTE — Addendum Note (Signed)
Addended by: Eulis Foster on: 01/29/2016 08:02 AM   Modules accepted: Orders

## 2016-01-29 NOTE — Addendum Note (Signed)
Addended by: Eulis Foster on: 01/29/2016 07:56 AM   Modules accepted: Orders

## 2016-01-29 NOTE — Addendum Note (Signed)
Addended by: Eulis Foster on: 01/29/2016 07:57 AM   Modules accepted: Orders

## 2016-01-29 NOTE — Addendum Note (Signed)
Addended by: Eulis Foster on: 01/29/2016 07:40 AM   Modules accepted: Orders

## 2016-01-30 ENCOUNTER — Encounter (HOSPITAL_COMMUNITY)
Admission: RE | Admit: 2016-01-30 | Discharge: 2016-01-30 | Disposition: A | Discharge status: Home / Self Care | Payer: BLUE CROSS/BLUE SHIELD | Source: Ambulatory Visit | Attending: Cardiovascular Disease | Admitting: Cardiovascular Disease

## 2016-01-30 DIAGNOSIS — I213 ST elevation (STEMI) myocardial infarction of unspecified site: Secondary | ICD-10-CM | POA: Diagnosis not present

## 2016-01-30 DIAGNOSIS — Z955 Presence of coronary angioplasty implant and graft: Secondary | ICD-10-CM | POA: Diagnosis not present

## 2016-02-02 ENCOUNTER — Encounter (HOSPITAL_COMMUNITY)
Admission: RE | Admit: 2016-02-02 | Discharge: 2016-02-02 | Disposition: A | Discharge status: Home / Self Care | Payer: BLUE CROSS/BLUE SHIELD | Source: Ambulatory Visit | Attending: Cardiovascular Disease | Admitting: Cardiovascular Disease

## 2016-02-02 DIAGNOSIS — Z955 Presence of coronary angioplasty implant and graft: Secondary | ICD-10-CM | POA: Diagnosis not present

## 2016-02-02 DIAGNOSIS — I213 ST elevation (STEMI) myocardial infarction of unspecified site: Secondary | ICD-10-CM | POA: Diagnosis not present

## 2016-02-02 NOTE — Progress Notes (Signed)
[name] 70 y.o. male Nutrition Note Spoke with pt. Pt concerned re: fasting blood sugar of 152 mg/dL during recent MD visit. Pt glucose tolerance and elevated A1c previously discussed. Pt interested in basic diabetic diet information. Pt educated re: which foods have carbohydrates and the relationship of carbs to blood sugar. Pt interested in checking his CBG's. Pt educated re: glucometer use. Pt able to exhibit understanding of glucometer education via feedback method. Pt encouraged to make an appointment with his PCP for further diabetes testing and/or diagnosis. Continue client-centered nutrition education by RD as part of interdisciplinary care.  Monitor and evaluate progress toward nutrition goal with team.  Derek Mound, M.Ed, RD, LDN, CDE 02/02/2016 3:21 PM

## 2016-02-04 ENCOUNTER — Encounter (HOSPITAL_COMMUNITY)
Admission: RE | Admit: 2016-02-04 | Discharge: 2016-02-04 | Disposition: A | Discharge status: Home / Self Care | Payer: BLUE CROSS/BLUE SHIELD | Source: Ambulatory Visit | Attending: Cardiovascular Disease | Admitting: Cardiovascular Disease

## 2016-02-04 DIAGNOSIS — Z955 Presence of coronary angioplasty implant and graft: Secondary | ICD-10-CM | POA: Diagnosis not present

## 2016-02-04 DIAGNOSIS — I213 ST elevation (STEMI) myocardial infarction of unspecified site: Secondary | ICD-10-CM | POA: Diagnosis not present

## 2016-02-04 LAB — GLUCOSE, CAPILLARY: Glucose-Capillary: 80 mg/dL (ref 65–99)

## 2016-02-04 NOTE — Progress Notes (Signed)
No psychosocial needs identified, no intervention necessary. Pt is exercising on his own at home, walking. Pt CBG elevated on recent labwork. Pt is interested in watching it at home fasting and post exercise.  Post exercise CBG-80 today.  Pt has appt with his PCP this week to discuss.  Will continue to monitor.

## 2016-02-06 ENCOUNTER — Encounter (HOSPITAL_COMMUNITY)
Admission: RE | Admit: 2016-02-06 | Discharge: 2016-02-06 | Disposition: A | Discharge status: Home / Self Care | Payer: BLUE CROSS/BLUE SHIELD | Source: Ambulatory Visit | Attending: Cardiovascular Disease | Admitting: Cardiovascular Disease

## 2016-02-06 ENCOUNTER — Ambulatory Visit (INDEPENDENT_AMBULATORY_CARE_PROVIDER_SITE_OTHER): Payer: BLUE CROSS/BLUE SHIELD | Admitting: Internal Medicine

## 2016-02-06 ENCOUNTER — Encounter: Payer: Self-pay | Admitting: Internal Medicine

## 2016-02-06 VITALS — BP 120/72 | HR 86 | Temp 98.6°F | Resp 20 | Ht 68.0 in | Wt 179.0 lb

## 2016-02-06 DIAGNOSIS — Z9861 Coronary angioplasty status: Secondary | ICD-10-CM | POA: Diagnosis not present

## 2016-02-06 DIAGNOSIS — I10 Essential (primary) hypertension: Secondary | ICD-10-CM

## 2016-02-06 DIAGNOSIS — I255 Ischemic cardiomyopathy: Secondary | ICD-10-CM | POA: Diagnosis not present

## 2016-02-06 DIAGNOSIS — R5381 Other malaise: Secondary | ICD-10-CM | POA: Diagnosis not present

## 2016-02-06 DIAGNOSIS — Z955 Presence of coronary angioplasty implant and graft: Secondary | ICD-10-CM | POA: Diagnosis not present

## 2016-02-06 DIAGNOSIS — I213 ST elevation (STEMI) myocardial infarction of unspecified site: Secondary | ICD-10-CM | POA: Diagnosis not present

## 2016-02-06 DIAGNOSIS — I2510 Atherosclerotic heart disease of native coronary artery without angina pectoris: Secondary | ICD-10-CM

## 2016-02-06 NOTE — Progress Notes (Signed)
Subjective:    Patient ID: [name], male    DOB: January 27, 1946, 70 y.o.   MRN: [id number]  HPI  Lab Results  Component Value Date   HGBA1C 6.6* 11/12/2015   70 year old patient who is followed closely by cardiology following a OOH VF cardiac arrest.  He is status post stenting of a proximal LAD and remains on dual antiplatelet therapy. His rehabilitation is progressing remarkably well and he states that he is at 75% of his pre-arrest.  Functional capacity.  He seems more limited by his rheumatologic issues, then deconditioning.  He has a long history of impaired glucose tolerance.  He does have a home glucometer and tracks.  His glycemic control;  blood sugars are often in the seventies and eighties.  He is on high-dose statin therapy  He has essential hypertension which has been well-controlled.  He is on modest dose beta blocker therapy, but a statin inhibition has been held due to low normal blood pressure readings. His ejection fraction has normalized from an EF of 20-25%.  He has a skin tag present involving the left lateral neck at the collar line that he wishes removed  Past Medical History  Diagnosis Date  . Rheumatoid arthritis(714.0) 1998  . Nephrolithiasis 1968  . Insomnia   . Allergic rhinitis   . Impaired glucose tolerance   . Hypertension   . Pelvic fracture ([place]) 09/2015  . Fractured rib   . CAD (coronary artery disease)     a. 11/11/15 - OOH VF arrest 2/2 ant STEMI >> CPR//AED shock x 2 >> ROSC >> LHC: pLAD 100 (Promus DES), mLCx 50, mRCA 30, EF 25% [managed with hypothermia protocol]  . ST elevation (STEMI) myocardial infarction involving left anterior descending coronary artery ([place]) 11/11/15    100% LAD, Cardiogenic Shock. -- EF ~20-25%  . Ischemic cardiomyopathy     a. EF at [facility] 11/11/15 25%;  b. Echo 11/14/15: EF 50-55%, dist ant and anterior septal HK  . History of Clostridium difficile colitis 10/2015    post MI >> required IV Flagyl and IV Vanc  >> DC on po Vanc  . HLD (hyperlipidemia)     Social History   Social History  . Marital Status: Married    Spouse Name: N/A  . Number of Children: N/A  . Years of Education: N/A   Occupational History  . Not on file.   Social History Main Topics  . 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 Smokeless tobacco: Never Used     Comment: quit 30 yrs ago  . Alcohol Use: No  . Drug Use: No  . Sexual Activity: Not on file   Other Topics Concern  . Not on file   Social History Narrative    Past Surgical History  Procedure Laterality Date  . Status post multiple lithotripy and urological surgery for reccurent calcium oxalate stones    . Cardiac catheterization N/A 11/11/2015    Procedure: Left Heart Cath and Cors/Grafts Angiography;  Surgeon: Sherren Mocha, MD;  Location: [facility];  Service: Cardiovascular;  Laterality: N/A;  . Cardiac catheterization N/A 11/11/2015    Procedure: Coronary Stent Intervention;  Surgeon: Sherren Mocha, MD;  Location: [facility];  Service: Cardiovascular;  Laterality: N/A;  prox lad 3.5x38 promus    Family History  Problem Relation Age of Onset  . Cancer Mother     pancreatic ; pulmonary embolism   . Diabetes Father     Allergies  Allergen  Reactions  . Penicillins Anaphylaxis  . Contrast Media [Iodinated Diagnostic Agents] Hives    03/28/14 pt received 1 hour emergent prep and pt did good and had no complaints after scan when given IV benadryl first. Had reaction 1 time, tolerated fine in 03/2014 with Benadryl  . Sulfamethoxazole Hives    Childhood reaction  . Ibuprofen Swelling    Happened 1 time    Current Outpatient Prescriptions on File Prior to Visit  Medication Sig Dispense Refill  . aspirin 81 MG chewable tablet Chew 1 tablet (81 mg total) by mouth daily.    Marland Kitchen atorvastatin (LIPITOR) 80 MG tablet Take 1 tablet (80 mg total) by mouth daily at 6 PM. 90 tablet 3  . cetirizine (ZYRTEC) 10 MG tablet Take 10 mg by mouth daily.     . Cholecalciferol (VITAMIN D-3) 1000 UNITS CAPS Take 5,000 Units by mouth daily.     . famotidine (PEPCID) 20 MG tablet Take 1 tablet (20 mg total) by mouth 2 (two) times daily. 180 tablet 0  . fluticasone (FLONASE) 50 MCG/ACT nasal spray USE 2 SPRAYS INTO NOSE DAILY 48 g 3  . folic acid (FOLVITE) 1 MG tablet Take 1 mg by mouth daily.      Marland Kitchen HYDROcodone-acetaminophen (NORCO/VICODIN) 5-325 MG tablet Take 1-2 tablets by mouth every 6 (six) hours as needed for moderate pain. 60 tablet 0  . methotrexate 2.5 MG tablet Take 15 mg by mouth once a week. Takes on Sunday    . metoprolol tartrate (LOPRESSOR) 25 MG tablet Take 0.5 tablets (12.5 mg total) by mouth 2 (two) times daily. 180 tablet 3  . nitroGLYCERIN (NITROSTAT) 0.4 MG SL tablet Place 1 tablet under the tongue as needed. Every 5 minutes for chest pain  3  . Probiotic Product (PRO-BIOTIC BLEND PO) Take 1 tablet by mouth daily.    . tamsulosin (FLOMAX) 0.4 MG CAPS capsule Take 1 capsule (0.4 mg total) by mouth daily. 90 capsule 0  . ticagrelor (BRILINTA) 90 MG TABS tablet Take 1 tablet (90 mg total) by mouth 2 (two) times daily. 180 tablet 0  . traMADol (ULTRAM) 50 MG tablet Take 50 mg by mouth every 6 (six) hours as needed for moderate pain.    . traZODone (DESYREL) 50 MG tablet Take 1 tablet (50 mg total) by mouth at bedtime. 30 tablet 0   No current facility-administered medications on file prior to visit.    BP 120/72 mmHg  Pulse 86  Temp(Src) 98.6 F (37 C) (Oral)  Resp 20  Ht 5\' 8"  (1.727 m)  Wt 179 lb (81.194 kg)  BMI 27.22 kg/m2  SpO2 98%      Review of Systems  Constitutional: Positive for fatigue. Negative for fever, chills and appetite change.  HENT: Negative for congestion, dental problem, ear pain, hearing loss, sore throat, tinnitus, trouble swallowing and voice change.   Eyes: Negative for pain, discharge and visual disturbance.  Respiratory: Negative for cough, chest tightness, wheezing and stridor.     Cardiovascular: Negative for chest pain, palpitations and leg swelling.  Gastrointestinal: Negative for nausea, vomiting, abdominal pain, diarrhea, constipation, blood in stool and abdominal distention.  Genitourinary: Negative for urgency, hematuria, flank pain, discharge, difficulty urinating and genital sores.  Musculoskeletal: Positive for arthralgias. Negative for myalgias, back pain, joint swelling, gait problem and neck stiffness.  Skin: Positive for wound. Negative for rash.  Neurological: Positive for weakness. Negative for dizziness, syncope, speech difficulty, numbness and headaches.  Hematological: Negative for adenopathy. Does  not bruise/bleed easily.  Psychiatric/Behavioral: Negative for behavioral problems and dysphoric mood. The patient is not nervous/anxious.        Objective:   Physical Exam  Constitutional: He is oriented to person, place, and time. He appears well-developed.  Blood pressure 120/70  HENT:  Head: Normocephalic.  Right Ear: External ear normal.  Left Ear: External ear normal.  Eyes: Conjunctivae and EOM are normal.  Neck: Normal range of motion.  Cardiovascular: Normal rate, normal heart sounds and intact distal pulses.   Pulmonary/Chest: Breath sounds normal.  Abdominal: Bowel sounds are normal.  Musculoskeletal: Normal range of motion. He exhibits no edema or tenderness.  Neurological: He is alert and oriented to person, place, and time.  Skin:  Skin tag left lateral neck  Psychiatric: He has a normal mood and affect. His behavior is normal.          Assessment & Plan:   Impaired glucose tolerance.  Will continue home blood sugar monitoring.  Will call the office if blood sugars trend up.  Otherwise, will recheck a hemoglobin A1c in 3 months Dyslipidemia.  Continue statin therapy CAD.  Follow-up cardiology Ischemic heart, myopathy, improved  Skin tag left lateral neck.  Procedure note ; after topical anesthesia, the lesion excised.   Hemostasis is controlled with brief pressure.  Band-Aid applied

## 2016-02-06 NOTE — Progress Notes (Signed)
Pre visit review using our clinic review tool, if applicable. No additional management support is needed unless otherwise documented below in the visit note. 

## 2016-02-06 NOTE — Patient Instructions (Signed)
Please check your hemoglobin A1c every 3 months    It is important that you exercise regularly, at least 20 minutes 3 to 4 times per week.  If you develop chest pain or shortness of breath seek  medical attention.  Limit your sodium (Salt) intake   

## 2016-02-09 ENCOUNTER — Encounter (HOSPITAL_COMMUNITY)
Admission: RE | Admit: 2016-02-09 | Discharge: 2016-02-09 | Disposition: A | Discharge status: Home / Self Care | Payer: BLUE CROSS/BLUE SHIELD | Source: Ambulatory Visit | Attending: Cardiovascular Disease | Admitting: Cardiovascular Disease

## 2016-02-09 DIAGNOSIS — Z955 Presence of coronary angioplasty implant and graft: Secondary | ICD-10-CM | POA: Diagnosis not present

## 2016-02-09 DIAGNOSIS — I213 ST elevation (STEMI) myocardial infarction of unspecified site: Secondary | ICD-10-CM | POA: Diagnosis not present

## 2016-02-10 ENCOUNTER — Telehealth (HOSPITAL_COMMUNITY): Payer: Self-pay | Admitting: Cardiac Rehabilitation

## 2016-02-10 NOTE — Telephone Encounter (Signed)
-----   Message from Sherren Mocha, MD sent at 02/09/2016  9:00 PM EST ----- Regarding: RE: cardiac rehab Ok to proceed with usual schedule. Please let me know if symptoms associated with this. thx Ronalee Belts ----- Message -----    From: Lowell Guitar, RN    Sent: 02/09/2016   4:45 PM      To: Sherren Mocha, MD Subject: cardiac rehab                                  Dear Dr. Burt Knack,   Blas[place]ell had frequent PVCs today with exercise at cardiac rehab. Bigeminal at times. Pt asymptomatic.  No change in regimen. VSS, resting BP:  112/70 HR-85, Peak HR-124, 124/70. Please advise.  Thank you, Andi Hence, RN, BSN Cardiac Pulmonary Rehab

## 2016-02-11 ENCOUNTER — Encounter (HOSPITAL_COMMUNITY)
Admission: RE | Admit: 2016-02-11 | Discharge: 2016-02-11 | Disposition: A | Discharge status: Home / Self Care | Payer: BLUE CROSS/BLUE SHIELD | Source: Ambulatory Visit | Attending: Cardiovascular Disease | Admitting: Cardiovascular Disease

## 2016-02-11 DIAGNOSIS — I213 ST elevation (STEMI) myocardial infarction of unspecified site: Secondary | ICD-10-CM | POA: Diagnosis not present

## 2016-02-11 DIAGNOSIS — Z955 Presence of coronary angioplasty implant and graft: Secondary | ICD-10-CM | POA: Insufficient documentation

## 2016-02-13 ENCOUNTER — Encounter (HOSPITAL_COMMUNITY): Payer: BLUE CROSS/BLUE SHIELD

## 2016-02-13 ENCOUNTER — Telehealth (HOSPITAL_COMMUNITY): Payer: Self-pay | Admitting: Internal Medicine

## 2016-02-16 ENCOUNTER — Encounter (HOSPITAL_COMMUNITY)
Admission: RE | Admit: 2016-02-16 | Discharge: 2016-02-16 | Disposition: A | Discharge status: Home / Self Care | Payer: BLUE CROSS/BLUE SHIELD | Source: Ambulatory Visit | Attending: Cardiovascular Disease | Admitting: Cardiovascular Disease

## 2016-02-16 DIAGNOSIS — Z955 Presence of coronary angioplasty implant and graft: Secondary | ICD-10-CM | POA: Diagnosis not present

## 2016-02-16 DIAGNOSIS — I213 ST elevation (STEMI) myocardial infarction of unspecified site: Secondary | ICD-10-CM | POA: Diagnosis not present

## 2016-02-18 ENCOUNTER — Encounter (HOSPITAL_COMMUNITY)
Admission: RE | Admit: 2016-02-18 | Discharge: 2016-02-18 | Disposition: A | Discharge status: Home / Self Care | Payer: BLUE CROSS/BLUE SHIELD | Source: Ambulatory Visit | Attending: Cardiovascular Disease | Admitting: Cardiovascular Disease

## 2016-02-18 DIAGNOSIS — Z955 Presence of coronary angioplasty implant and graft: Secondary | ICD-10-CM | POA: Diagnosis not present

## 2016-02-18 DIAGNOSIS — I213 ST elevation (STEMI) myocardial infarction of unspecified site: Secondary | ICD-10-CM | POA: Diagnosis not present

## 2016-02-20 ENCOUNTER — Encounter (HOSPITAL_COMMUNITY)
Admission: RE | Admit: 2016-02-20 | Discharge: 2016-02-20 | Disposition: A | Discharge status: Home / Self Care | Payer: BLUE CROSS/BLUE SHIELD | Source: Ambulatory Visit | Attending: Cardiovascular Disease | Admitting: Cardiovascular Disease

## 2016-02-20 DIAGNOSIS — Z955 Presence of coronary angioplasty implant and graft: Secondary | ICD-10-CM | POA: Diagnosis not present

## 2016-02-20 DIAGNOSIS — I213 ST elevation (STEMI) myocardial infarction of unspecified site: Secondary | ICD-10-CM | POA: Diagnosis not present

## 2016-02-23 ENCOUNTER — Encounter (HOSPITAL_COMMUNITY)
Admission: RE | Admit: 2016-02-23 | Discharge: 2016-02-23 | Disposition: A | Discharge status: Home / Self Care | Payer: BLUE CROSS/BLUE SHIELD | Source: Ambulatory Visit | Attending: Cardiovascular Disease | Admitting: Cardiovascular Disease

## 2016-02-23 DIAGNOSIS — I213 ST elevation (STEMI) myocardial infarction of unspecified site: Secondary | ICD-10-CM | POA: Diagnosis not present

## 2016-02-23 DIAGNOSIS — Z955 Presence of coronary angioplasty implant and graft: Secondary | ICD-10-CM | POA: Diagnosis not present

## 2016-02-25 ENCOUNTER — Encounter (HOSPITAL_COMMUNITY)
Admission: RE | Admit: 2016-02-25 | Discharge: 2016-02-25 | Disposition: A | Discharge status: Home / Self Care | Payer: BLUE CROSS/BLUE SHIELD | Source: Ambulatory Visit | Attending: Cardiovascular Disease | Admitting: Cardiovascular Disease

## 2016-02-25 DIAGNOSIS — I213 ST elevation (STEMI) myocardial infarction of unspecified site: Secondary | ICD-10-CM | POA: Diagnosis not present

## 2016-02-25 DIAGNOSIS — Z955 Presence of coronary angioplasty implant and graft: Secondary | ICD-10-CM | POA: Diagnosis not present

## 2016-02-27 ENCOUNTER — Encounter (HOSPITAL_COMMUNITY)
Admission: RE | Admit: 2016-02-27 | Discharge: 2016-02-27 | Disposition: A | Discharge status: Home / Self Care | Payer: BLUE CROSS/BLUE SHIELD | Source: Ambulatory Visit | Attending: Cardiovascular Disease | Admitting: Cardiovascular Disease

## 2016-02-27 DIAGNOSIS — Z955 Presence of coronary angioplasty implant and graft: Secondary | ICD-10-CM | POA: Diagnosis not present

## 2016-02-27 DIAGNOSIS — I213 ST elevation (STEMI) myocardial infarction of unspecified site: Secondary | ICD-10-CM | POA: Diagnosis not present

## 2016-03-01 ENCOUNTER — Encounter: Payer: Self-pay | Admitting: Cardiovascular Disease

## 2016-03-01 ENCOUNTER — Ambulatory Visit (INDEPENDENT_AMBULATORY_CARE_PROVIDER_SITE_OTHER): Payer: BLUE CROSS/BLUE SHIELD | Admitting: Cardiovascular Disease

## 2016-03-01 ENCOUNTER — Encounter (HOSPITAL_COMMUNITY): Payer: Self-pay

## 2016-03-01 ENCOUNTER — Encounter (HOSPITAL_COMMUNITY)
Admission: RE | Admit: 2016-03-01 | Discharge: 2016-03-01 | Disposition: A | Discharge status: Home / Self Care | Payer: BLUE CROSS/BLUE SHIELD | Source: Ambulatory Visit | Attending: Cardiovascular Disease | Admitting: Cardiovascular Disease

## 2016-03-01 VITALS — BP 126/68 | HR 88 | Ht 69.0 in | Wt 179.0 lb

## 2016-03-01 DIAGNOSIS — I252 Old myocardial infarction: Secondary | ICD-10-CM

## 2016-03-01 DIAGNOSIS — I255 Ischemic cardiomyopathy: Secondary | ICD-10-CM

## 2016-03-01 DIAGNOSIS — I213 ST elevation (STEMI) myocardial infarction of unspecified site: Secondary | ICD-10-CM | POA: Diagnosis not present

## 2016-03-01 DIAGNOSIS — Z955 Presence of coronary angioplasty implant and graft: Secondary | ICD-10-CM | POA: Diagnosis not present

## 2016-03-01 DIAGNOSIS — I2510 Atherosclerotic heart disease of native coronary artery without angina pectoris: Secondary | ICD-10-CM | POA: Diagnosis not present

## 2016-03-01 NOTE — Patient Instructions (Signed)
Medication Instructions:  Your physician recommends that you continue on your current medications as directed. Please refer to the Current Medication list given to you today.  Labwork: No new orders.   Testing/Procedures: No new orders.   Follow-Up: Your physician recommends that you schedule a follow-up appointment in: 3 MONTHS with Dr Cooper   Any Other Special Instructions Will Be Listed Below (If Applicable).     If you need a refill on your cardiac medications before your next appointment, please call your pharmacy.   

## 2016-03-01 NOTE — Progress Notes (Signed)
Pt graduated from cardiac rehab program today with completion of 24 exercise sessions in Phase II. Pt exited program early due to cardiology clearance and physical limitations from arthritic pain.  Pt maintained good attendance and progressed nicely during his participation in rehab as evidenced by increased MET level.   Medication list reconciled. Repeat  PHQ score-  0.  Pt has made significant lifestyle changes and should be commended for his success. Pt feels he has achieved his goals during cardiac rehab, which include weight loss, and increased strength/stamina. Pt has regained confidence with his activities.  However pt is limited to cardiac rehab equipment due to his RA pain. Pt is hopeful his cardiologist and rheumatologist can work together to get pain regimen reinstated.   Pt plans to continue exercising on his own at home using treadmill, bike and kayak.

## 2016-03-01 NOTE — Progress Notes (Signed)
Cardiology Office Note Date:  03/01/2016   ID:  [name], Town [birth year], MRN [id number]  PCP:  [name], MD  Cardiologist:  Sherren Mocha, MD    Chief Complaint  Patient presents with  . Follow-up    CAD/MI    History of Present Illness: [name] is a 70 y.o. male who presents for follow-up after sustaining an anterior MI complicated by out-of-hospital cardiac arrest in November 2016. He had a very long hospitalization but fortunately has now had an excellent recovery. Initial LVEF 25% by cath but quickly improved to 55% just a few days later by echo. He is here with his wife today for follow-up.   He is primarily limited by rheumatoid arthritis (knee and shoulder pain). Feels like he's 'peaked out' with what he can do at cardiac rehab and is going to start riding a stationary bike on his own. He has no cardiopulmonary symptoms with exertion. Biggest concern is that he hasn't been able to take NSAID's for pain since his MI and is having problems with pain control since then. Tramadol hasn't worked well and he has side effects from it. He would like to go back to work. He works part-time at [employer] and enjoys that. Feels like he could go back as long as he is restricted from heavy lifting.   Past Medical History  Diagnosis Date  . Rheumatoid arthritis(714.0) 1998  . Nephrolithiasis 1968  . Insomnia   . Allergic rhinitis   . Impaired glucose tolerance   . Hypertension   . Pelvic fracture ([place]) 09/2015  . Fractured rib   . CAD (coronary artery disease)     a. 11/11/15 - OOH VF arrest 2/2 ant STEMI >> CPR//AED shock x 2 >> ROSC >> LHC: pLAD 100 (Promus DES), mLCx 50, mRCA 30, EF 25% [managed with hypothermia protocol]  . ST elevation (STEMI) myocardial infarction involving left anterior descending coronary artery ([place]) 11/11/15    100% LAD, Cardiogenic Shock. -- EF ~20-25%  . Ischemic cardiomyopathy     a. EF at [facility] 11/11/15 25%;  b. Echo 11/14/15:  EF 50-55%, dist ant and anterior septal HK  . History of Clostridium difficile colitis 10/2015    post MI >> required IV Flagyl and IV Vanc >> DC on po Vanc  . HLD (hyperlipidemia)     Past Surgical History  Procedure Laterality Date  . Status post multiple lithotripy and urological surgery for reccurent calcium oxalate stones    . Cardiac catheterization N/A 11/11/2015    Procedure: Left Heart Cath and Cors/Grafts Angiography;  Surgeon: Sherren Mocha, MD;  Location: [place] [facility];  Service: Cardiovascular;  Laterality: N/A;  . Cardiac catheterization N/A 11/11/2015    Procedure: Coronary Stent Intervention;  Surgeon: Sherren Mocha, MD;  Location: [place] [facility];  Service: Cardiovascular;  Laterality: N/A;  prox lad 3.5x38 promus    Current Outpatient Prescriptions  Medication Sig Dispense Refill  . aspirin 81 MG chewable tablet Chew 1 tablet (81 mg total) by mouth daily.    Marland Kitchen atorvastatin (LIPITOR) 80 MG tablet Take 1 tablet (80 mg total) by mouth daily at 6 PM. 90 tablet 3  . cetirizine (ZYRTEC) 10 MG tablet Take 10 mg by mouth daily.    . Cholecalciferol (VITAMIN D-3) 1000 UNITS CAPS Take 5,000 Units by mouth daily.     . famotidine (PEPCID) 20 MG tablet Take 1 tablet (20 mg total) by mouth 2 (two) times daily. 180 tablet 0  .  fluticasone (FLONASE) 50 MCG/ACT nasal spray USE 2 SPRAYS INTO NOSE DAILY 48 g 3  . folic acid (FOLVITE) 1 MG tablet Take 1 mg by mouth daily.      Marland Kitchen HYDROcodone-acetaminophen (NORCO/VICODIN) 5-325 MG tablet Take 1-2 tablets by mouth every 6 (six) hours as needed for moderate pain. 60 tablet 0  . Methotrexate Sodium (METHOTREXATE PO) Take 20 mg by mouth once a week. Once a day on Sunday    . metoprolol tartrate (LOPRESSOR) 25 MG tablet Take 0.5 tablets (12.5 mg total) by mouth 2 (two) times daily. 180 tablet 3  . nitroGLYCERIN (NITROSTAT) 0.4 MG SL tablet Place 1 tablet under the tongue as needed. Every 5 minutes for chest pain  3  . Probiotic Product  (PRO-BIOTIC BLEND PO) Take 1 tablet by mouth daily.    . tamsulosin (FLOMAX) 0.4 MG CAPS capsule Take 1 capsule (0.4 mg total) by mouth daily. 90 capsule 0  . ticagrelor (BRILINTA) 90 MG TABS tablet Take 1 tablet (90 mg total) by mouth 2 (two) times daily. 180 tablet 0  . traMADol (ULTRAM) 50 MG tablet Take 50 mg by mouth every 6 (six) hours as needed for moderate pain.    . traZODone (DESYREL) 50 MG tablet Take 1 tablet (50 mg total) by mouth at bedtime. 30 tablet 0   No current facility-administered medications for this visit.    Allergies:   Penicillins; Contrast media; Sulfamethoxazole; and Ibuprofen   Social History:  The patient  reports that he has quit smoking. He has never used smokeless tobacco. He reports that he does not drink alcohol or use illicit drugs.   Family History:  The patient's  family history includes Cancer in his mother; Diabetes in his father.   ROS:  Please see the history of present illness.  Otherwise, review of systems is positive for knee pain, shoulder pain.  All other systems are reviewed and negative.   PHYSICAL EXAM: VS:  BP 126/68 mmHg  Pulse 88  Ht 5\' 9"  (1.753 m)  Wt 179 lb (81.194 kg)  BMI 26.42 kg/m2 , BMI Body mass index is 26.42 kg/(m^2). GEN: Well nourished, well developed, in no acute distress HEENT: normal Neck: no JVD, no masses. No carotid bruits Cardiac: RRR without murmur or gallop                Respiratory:  clear to auscultation bilaterally, normal work of breathing GI: soft, nontender, nondistended, + BS MS: no deformity or atrophy Ext: no pretibial edema, pedal pulses 2+= bilaterally Skin: warm and dry, no rash Neuro:  Strength and sensation are intact Psych: euthymic mood, full affect  EKG:  EKG is not ordered today.  Recent Labs: 11/12/2015: TSH 0.653 11/29/2015: Magnesium 2.3 01/29/2016: ALT 28; BUN 13; Creat 0.93; Hemoglobin 14.7; Platelets 220; Potassium 3.6; Sodium 138   Lipid Panel     Component Value Date/Time     CHOL 111* 01/29/2016 0757   TRIG 124 01/29/2016 0757   HDL 27* 01/29/2016 0757   CHOLHDL 4.1 01/29/2016 0757   VLDL 25 01/29/2016 0757   LDLCALC 59 01/29/2016 0757      Wt Readings from Last 3 Encounters:  03/01/16 179 lb (81.194 kg)  02/06/16 179 lb (81.194 kg)  01/01/16 176 lb 12.9 oz (80.2 kg)     Cardiac Studies Reviewed: Cardiac Cath 11/11/2015: Conclusion    1. Total occlusion of the proximal LAD, treated successfully with primary PCI (drug-eluting stent) 2. Nonobstructive LCx and RCA stenoses 3.  Severe segmental LV systolic dysfunction, LVEF 25%  Recommendations: hypothermia protocol per CCM team, post-MI medical therapy and supportive care in CCU setting.    Indications    ST elevation myocardial infarction involving left anterior descending (LAD) coronary artery ([name]) [I21.02 (ICD-10-CM)]    Technique and Indications    INDICATION: Anterior STEMI with out-of-hospital VF arrest  PROCEDURAL DETAILS: The right wrist was prepped, draped, and anesthetized with 1% lidocaine. Using the modified Seldinger technique, a 5/6 French Slender sheath was introduced into the right radial artery. 3 mg of verapamil was administered through the sheath, weight-based unfractionated heparin was administered intravenously. Standard Judkins catheters were used for selective coronary angiography and left ventriculography. Catheter exchanges were performed over an exchange length guidewire. PCI is performed - see PCI note below. There were no immediate procedural complications. A TR band was used for radial hemostasis at the completion of the procedure. The patient was transferred to the post catheterization recovery area for further monitoring.  Estimated blood loss <50 mL. There were no immediate complications during the procedure.    Coronary Findings    Dominance: Right   Left Anterior Descending   . Prox LAD lesion, 100% stenosed. Thrombotic.   Marland Kitchen PCI: There is no  pre-interventional antegrade distal flow (TIMI 0). Pre-stent angioplasty was performed. A drug-eluting stent was placed. Post-stent angioplasty was performed. The post-interventional distal flow is normal (TIMI 3). The intervention was successful. No complications occurred at this lesion. The proximal LAD is 100% occluded with TIMI-0 flow. Heparin and aggrastat are used for systemic anticoagulation. Brilinta 180 mg is administered via the OG tube. An XB-LAD guide is used and a [employer]er[employer] retail is used to cross the occlusion. The LAD is treated with a 2.5x12 mm balloon on multiple inflations. Angiography shows severe diffuse proximal LAD stenosis post-angioplasty. The vessel is stented with a 3.5x38 mm Promus DES and post-dilated with a 3.75 mm [place] balloon.  . There is no residual stenosis post intervention.       Left Circumflex   . Mid Cx lesion, 50% stenosed.     Right Coronary Artery   . Mid RCA lesion, 30% stenosed.      Wall Motion                 Left Heart    Left Ventricle The left ventricular size is normal. There is severe left ventricular systolic dysfunction. The left ventricular ejection fraction is 25-35% by visual estimate. There are wall motion abnormalities in the left ventricle. There are segmental wall motion abnormalities in the left ventricle. The anterolateral and periapical LV walls are akinetic with an estimated LVEF of 25%    Coronary Diagrams    Diagnostic Diagram           Post-Intervention Diagram             Echo 11/14/2015: Left ventricle: The cavity size was normal. Systolic function was normal. The estimated ejection fraction was in the range of 50% to 55%. There may be hypokinesis of the distal anterior wall and anterior septum. The study is not technically sufficient to allow evaluation of LV diastolic function.  ------------------------------------------------------------------- Aortic valve: Poorly visualized. Normal thickness  leaflets. Mobility was not restricted. Doppler: Transvalvular velocity was within the normal range. There was no stenosis. There was no regurgitation.  ------------------------------------------------------------------- Aorta: Aortic root: The aortic root was normal in size.  ------------------------------------------------------------------- Mitral valve: Structurally normal valve. Mobility was not restricted. Doppler: Transvalvular velocity was within the normal  range. There was no evidence for stenosis. There was no regurgitation.  ------------------------------------------------------------------- Left atrium: The atrium was normal in size.  ------------------------------------------------------------------- Right ventricle: The cavity size was normal. Wall thickness was normal. Systolic function was normal.  ------------------------------------------------------------------- Pulmonic valve: Poorly visualized. Doppler: Transvalvular velocity was within the normal range. There was no evidence for stenosis.  ------------------------------------------------------------------- Tricuspid valve: Structurally normal valve. Leaflet separation was normal. Doppler: Transvalvular velocity was within the normal range. There was no regurgitation.  ------------------------------------------------------------------- Pulmonary artery: Poorly visualized. The main pulmonary artery was normal-sized. Systolic pressure was within the normal range.  ------------------------------------------------------------------- Right atrium: The atrium was normal in size.  ------------------------------------------------------------------- Pericardium: There was no pericardial effusion.  ------------------------------------------------------------------- Systemic veins: Inferior vena cava: The vessel was normal in size. The respirophasic diameter changes were in the  normal range (= 50%), consistent with normal central venous pressure.  ASSESSMENT AND PLAN: 1.  CAD, native vessel, with old MI, no angina: pt progressing well. Medications reviewed and will be continued. I think it's ok for him to return to work with a 20# lifting restriction. He works indoors and is not out in the elements. I will see him back in 3 months.   2. Hyperlipidemia: lipids reviewed and at goal. He's tolerating a statin drug.   3. HTN: controlled on low-dose metoprolol  4. Rheumatoid arthritis: he did much better on NSAID's, but with dual antiplatelet Rx and hx of major MI I'm not sure he'll be able to take NSAIDs any longer.   Current medicines are reviewed with the patient today.  The patient does not have concerns regarding medicines.  Labs/ tests ordered today include:  No orders of the defined types were placed in this encounter.    Disposition:   FU 3 months  Signed, Sherren Mocha, MD  03/01/2016 4:58 PM    [facility] [place], [place], [place]  [address] Phone: [phone]; Fax: [phone]

## 2016-03-02 ENCOUNTER — Ambulatory Visit: Payer: BLUE CROSS/BLUE SHIELD | Admitting: Cardiovascular Disease

## 2016-03-03 ENCOUNTER — Encounter (HOSPITAL_COMMUNITY): Payer: BLUE CROSS/BLUE SHIELD

## 2016-03-05 ENCOUNTER — Encounter (HOSPITAL_COMMUNITY): Payer: BLUE CROSS/BLUE SHIELD

## 2016-03-08 ENCOUNTER — Encounter (HOSPITAL_COMMUNITY): Payer: BLUE CROSS/BLUE SHIELD

## 2016-03-10 ENCOUNTER — Encounter (HOSPITAL_COMMUNITY): Payer: BLUE CROSS/BLUE SHIELD

## 2016-03-12 ENCOUNTER — Encounter (HOSPITAL_COMMUNITY): Payer: BLUE CROSS/BLUE SHIELD

## 2016-03-15 ENCOUNTER — Encounter (HOSPITAL_COMMUNITY): Payer: BLUE CROSS/BLUE SHIELD

## 2016-03-17 ENCOUNTER — Encounter (HOSPITAL_COMMUNITY): Payer: BLUE CROSS/BLUE SHIELD

## 2016-03-19 ENCOUNTER — Encounter (HOSPITAL_COMMUNITY): Payer: BLUE CROSS/BLUE SHIELD

## 2016-03-22 ENCOUNTER — Encounter (HOSPITAL_COMMUNITY): Payer: BLUE CROSS/BLUE SHIELD

## 2016-03-24 ENCOUNTER — Encounter (HOSPITAL_COMMUNITY): Payer: BLUE CROSS/BLUE SHIELD

## 2016-03-26 ENCOUNTER — Encounter (HOSPITAL_COMMUNITY): Payer: BLUE CROSS/BLUE SHIELD

## 2016-03-29 ENCOUNTER — Encounter (HOSPITAL_COMMUNITY): Payer: BLUE CROSS/BLUE SHIELD

## 2016-03-31 ENCOUNTER — Encounter (HOSPITAL_COMMUNITY): Payer: BLUE CROSS/BLUE SHIELD

## 2016-04-02 ENCOUNTER — Encounter (HOSPITAL_COMMUNITY): Payer: BLUE CROSS/BLUE SHIELD

## 2016-04-05 ENCOUNTER — Encounter (HOSPITAL_COMMUNITY): Payer: BLUE CROSS/BLUE SHIELD

## 2016-04-07 ENCOUNTER — Encounter (HOSPITAL_COMMUNITY): Payer: BLUE CROSS/BLUE SHIELD

## 2016-04-09 ENCOUNTER — Encounter (HOSPITAL_COMMUNITY): Payer: BLUE CROSS/BLUE SHIELD

## 2016-04-18 ENCOUNTER — Other Ambulatory Visit: Payer: Self-pay | Admitting: Cardiovascular Disease

## 2016-04-21 DIAGNOSIS — M0589 Other rheumatoid arthritis with rheumatoid factor of multiple sites: Secondary | ICD-10-CM | POA: Diagnosis not present

## 2016-04-21 DIAGNOSIS — M150 Primary generalized (osteo)arthritis: Secondary | ICD-10-CM | POA: Diagnosis not present

## 2016-05-07 ENCOUNTER — Ambulatory Visit (INDEPENDENT_AMBULATORY_CARE_PROVIDER_SITE_OTHER): Payer: BLUE CROSS/BLUE SHIELD | Admitting: Internal Medicine

## 2016-05-07 ENCOUNTER — Encounter: Payer: Self-pay | Admitting: Internal Medicine

## 2016-05-07 VITALS — BP 122/70 | HR 64 | Temp 98.4°F | Resp 20 | Ht 69.0 in | Wt 180.0 lb

## 2016-05-07 DIAGNOSIS — Z87891 Personal history of nicotine dependence: Secondary | ICD-10-CM | POA: Diagnosis not present

## 2016-05-07 DIAGNOSIS — E785 Hyperlipidemia, unspecified: Secondary | ICD-10-CM

## 2016-05-07 DIAGNOSIS — I255 Ischemic cardiomyopathy: Secondary | ICD-10-CM

## 2016-05-07 DIAGNOSIS — I10 Essential (primary) hypertension: Secondary | ICD-10-CM

## 2016-05-07 DIAGNOSIS — E1151 Type 2 diabetes mellitus with diabetic peripheral angiopathy without gangrene: Secondary | ICD-10-CM | POA: Diagnosis not present

## 2016-05-07 DIAGNOSIS — E739 Lactose intolerance, unspecified: Secondary | ICD-10-CM | POA: Diagnosis not present

## 2016-05-07 MED ORDER — METFORMIN HCL ER 500 MG PO TB24: 1000.0000 mg | ORAL_TABLET | Freq: Every day | ORAL | Status: DC

## 2016-05-07 NOTE — Progress Notes (Signed)
Subjective:    Patient ID: [name], male    DOB: 05/18/1946, 70 y.o.   MRN: [id number]  HPI  Lab Results  Component Value Date   HGBA1C 6.6* 11/12/2015   70 year old patient who is seen today for follow-up.  He has a long history of impaired glucose tolerance and a hemoglobin A1c in late November was 6.6.  He was hospitalized for an acute ST elevation MI, CAD by cardiac arrest with V. fib.  Acute respiratory failure and ventilatory support. He has an ischemic cardio myopathy and posthospitalization was quite deconditioned.  He is doing much better and is back to work part-time. He is limited with RA and is considering biologicals but concerned about cardiac side effects.  He is followed by rheumatology He has essential hypertension.  He does monitor blood sugar readings with readings generally in the 1:30 to 150 range. Past Medical History  Diagnosis Date  . Rheumatoid arthritis(714.0) 1998  . Nephrolithiasis 1968  . Insomnia   . Allergic rhinitis   . Impaired glucose tolerance   . Hypertension   . Pelvic fracture ([place]) 09/2015  . Fractured rib   . CAD (coronary artery disease)     a. 11/11/15 - OOH VF arrest 2/2 ant STEMI >> CPR//AED shock x 2 >> ROSC >> LHC: pLAD 100 (Promus DES), mLCx 50, mRCA 30, EF 25% [managed with hypothermia protocol]  . ST elevation (STEMI) myocardial infarction involving left anterior descending coronary artery ([place]) 11/11/15    100% LAD, Cardiogenic Shock. -- EF ~20-25%  . Ischemic cardiomyopathy     a. EF at [facility] 11/11/15 25%;  b. Echo 11/14/15: EF 50-55%, dist ant and anterior septal HK  . History of Clostridium difficile colitis 10/2015    post MI >> required IV Flagyl and IV Vanc >> DC on po Vanc  . HLD (hyperlipidemia)      Social History   Social History  . Marital Status: Married    Spouse Name: N/A  . Number of Children: N/A  . Years of Education: N/A   Occupational History  . Not on file.   Social History Main Topics  .  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 Smokeless tobacco: Never Used     Comment: quit 30 yrs ago  . Alcohol Use: No  . Drug Use: No  . Sexual Activity: Not on file   Other Topics Concern  . Not on file   Social History Narrative    Past Surgical History  Procedure Laterality Date  . Status post multiple lithotripy and urological surgery for reccurent calcium oxalate stones    . Cardiac catheterization N/A 11/11/2015    Procedure: Left Heart Cath and Cors/Grafts Angiography;  Surgeon: Sherren Mocha, MD;  Location: [place] [facility];  Service: Cardiovascular;  Laterality: N/A;  . Cardiac catheterization N/A 11/11/2015    Procedure: Coronary Stent Intervention;  Surgeon: Sherren Mocha, MD;  Location: [place] [facility];  Service: Cardiovascular;  Laterality: N/A;  prox lad 3.5x38 promus    Family History  Problem Relation Age of Onset  . Cancer Mother     pancreatic ; pulmonary embolism   . Diabetes Father     Allergies  Allergen Reactions  . Penicillins Anaphylaxis  . Contrast Media [Iodinated Diagnostic Agents] Hives    03/28/14 pt received 1 hour emergent prep and pt did good and had no complaints after scan when given IV benadryl first. Had reaction 1 time, tolerated fine in 03/2014 with Benadryl  .  Sulfamethoxazole Hives    Childhood reaction  . Ibuprofen Swelling    Happened 1 time    Current Outpatient Prescriptions on File Prior to Visit  Medication Sig Dispense Refill  . aspirin 81 MG chewable tablet Chew 1 tablet (81 mg total) by mouth daily.    Marland Kitchen atorvastatin (LIPITOR) 80 MG tablet Take 1 tablet (80 mg total) by mouth daily at 6 PM. 90 tablet 3  . BRILINTA 90 MG TABS tablet TAKE 1 TABLET BY MOUTH TWICE A DAY 180 tablet 3  . cetirizine (ZYRTEC) 10 MG tablet Take 10 mg by mouth daily.    . Cholecalciferol (VITAMIN D-3) 1000 UNITS CAPS Take 5,000 Units by mouth daily.     . famotidine (PEPCID) 20 MG tablet Take 1 tablet (20 mg total) by mouth 2 (two) times daily. 180  tablet 0  . fluticasone (FLONASE) 50 MCG/ACT nasal spray USE 2 SPRAYS INTO NOSE DAILY 48 g 3  . folic acid (FOLVITE) 1 MG tablet Take 1 mg by mouth daily.      Marland Kitchen HYDROcodone-acetaminophen (NORCO/VICODIN) 5-325 MG tablet Take 1-2 tablets by mouth every 6 (six) hours as needed for moderate pain. 60 tablet 0  . Methotrexate Sodium (METHOTREXATE PO) Take 20 mg by mouth once a week. Once a day on Sunday    . metoprolol tartrate (LOPRESSOR) 25 MG tablet Take 0.5 tablets (12.5 mg total) by mouth 2 (two) times daily. 180 tablet 3  . nitroGLYCERIN (NITROSTAT) 0.4 MG SL tablet Place 1 tablet under the tongue as needed. Every 5 minutes for chest pain  3  . Probiotic Product (PRO-BIOTIC BLEND PO) Take 1 tablet by mouth daily.    . tamsulosin (FLOMAX) 0.4 MG CAPS capsule Take 1 capsule (0.4 mg total) by mouth daily. 90 capsule 0  . traMADol (ULTRAM) 50 MG tablet Take 50 mg by mouth every 6 (six) hours as needed for moderate pain.    . traZODone (DESYREL) 50 MG tablet Take 1 tablet (50 mg total) by mouth at bedtime. 30 tablet 0   No current facility-administered medications on file prior to visit.    BP 122/70 mmHg  Pulse 64  Temp(Src) 98.4 F (36.9 C) (Oral)  Resp 20  Ht 5\' 9"  (1.753 m)  Wt 180 lb (81.647 kg)  BMI 26.57 kg/m2       Review of Systems  Constitutional: Negative for fever, chills, appetite change and fatigue.  HENT: Negative for congestion, dental problem, ear pain, hearing loss, sore throat, tinnitus, trouble swallowing and voice change.   Eyes: Negative for pain, discharge and visual disturbance.  Respiratory: Negative for cough, chest tightness, wheezing and stridor.   Cardiovascular: Negative for chest pain, palpitations and leg swelling.  Gastrointestinal: Negative for nausea, vomiting, abdominal pain, diarrhea, constipation, blood in stool and abdominal distention.  Genitourinary: Negative for urgency, hematuria, flank pain, discharge, difficulty urinating and genital sores.   Musculoskeletal: Negative for myalgias, back pain, joint swelling, arthralgias, gait problem and neck stiffness.  Skin: Negative for rash.  Neurological: Negative for dizziness, syncope, speech difficulty, weakness, numbness and headaches.  Hematological: Negative for adenopathy. Does not bruise/bleed easily.  Psychiatric/Behavioral: Negative for behavioral problems and dysphoric mood. The patient is not nervous/anxious.        Objective:   Physical Exam  Constitutional: He is oriented to person, place, and time. He appears well-developed.  Clinically, looks good Blood pressure low normal.  No distress   HENT:  Head: Normocephalic.  Right Ear: External ear normal.  Left Ear: External ear normal.  Eyes: Conjunctivae and EOM are normal.  Neck: Normal range of motion.  Cardiovascular: Normal rate and normal heart sounds.   Pulmonary/Chest: Breath sounds normal.  Abdominal: Bowel sounds are normal.  Musculoskeletal: Normal range of motion. He exhibits no edema or tenderness.  Neurological: He is alert and oriented to person, place, and time.  Psychiatric: He has a normal mood and affect. His behavior is normal.          Assessment & Plan:  Diabetes mellitus .  2.  Hemoglobin A1c 6.6.  We'll repeat.  Will place on metformin therapy 1000 mg daily.  Recheck 3 months Essential hypertension, well-controlled Ischemic cardio myopathy.  Follow cardiology Coronary artery disease.  Continue aggressive risk factor modification and antiplatelet drug therapy   [name], MD

## 2016-05-07 NOTE — Patient Instructions (Signed)
Please check your hemoglobin A1c every 3 months  Limit your sodium (Salt) intake    It is important that you exercise regularly, at least 20 minutes 3 to 4 times per week.  If you develop chest pain or shortness of breath seek  medical attention.  Cardiology follow-up as scheduled

## 2016-05-07 NOTE — Progress Notes (Signed)
Pre visit review using our clinic review tool, if applicable. No additional management support is needed unless otherwise documented below in the visit note. 

## 2016-05-08 LAB — HEMOGLOBIN A1C
Hgb A1c MFr Bld: 6.8 % — ABNORMAL HIGH (ref <5.7)
MEAN PLASMA GLUCOSE: 148 mg/dL

## 2016-05-31 ENCOUNTER — Ambulatory Visit (INDEPENDENT_AMBULATORY_CARE_PROVIDER_SITE_OTHER): Payer: BLUE CROSS/BLUE SHIELD | Admitting: Cardiovascular Disease

## 2016-05-31 ENCOUNTER — Encounter: Payer: Self-pay | Admitting: Cardiovascular Disease

## 2016-05-31 VITALS — BP 132/70 | HR 73 | Ht 69.0 in | Wt 177.4 lb

## 2016-05-31 DIAGNOSIS — I255 Ischemic cardiomyopathy: Secondary | ICD-10-CM

## 2016-05-31 DIAGNOSIS — I2510 Atherosclerotic heart disease of native coronary artery without angina pectoris: Secondary | ICD-10-CM

## 2016-05-31 NOTE — Progress Notes (Signed)
Cardiology Office Note Date:  05/31/2016   ID:  Yiming, Leblanc Dec 02, 1946, MRN [id number]  PCP:  [name], MD  Cardiologist:  Sherren Mocha, MD    Chief Complaint  Patient presents with  . Coronary Artery Disease     History of Present Illness: [name] is a 70 y.o. male who presents for follow-up after sustaining an anterior MI complicated by out-of-hospital cardiac arrest in November 2016. He had a very long hospitalization but fortunately has now had an excellent recovery. Initial LVEF 25% by cath but quickly improved to 55% just a few days later by echo. He was last seen here in March and was doing well at that time. He was released to return to work with some lifting restrictions.  Overall the patient is doing fairly well. He is working at [employer], Wednesdays, and Thursdays. He's up on his feet on a hard surface when he works and complains of pain in his hips, knees, and ankles associated with work. He's had more pain since he has had to discontinue nonsteroidal anti-inflammatory drugs. He continues to follow closely with Dr. Amil Amen. He's been reluctant to consider going back on any biological agents. From a cardiac perspective, he reports no change in symptoms. He occasionally has a feeling of shortness of breath where he has to take some deep breaths. He denies chest pain or pressure. He complains of a cough which she's had ever since his hospitalization last year. No edema, orthopnea, or PND. No lightheadedness or presyncope.  Past Medical History  Diagnosis Date  . Rheumatoid arthritis(714.0) 1998  . Nephrolithiasis 1968  . Insomnia   . Allergic rhinitis   . Impaired glucose tolerance   . Hypertension   . Pelvic fracture ([place]) 09/2015  . Fractured rib   . CAD (coronary artery disease)     a. 11/11/15 - OOH VF arrest 2/2 ant STEMI >> CPR//AED shock x 2 >> ROSC >> LHC: pLAD 100 (Promus DES), mLCx 50, mRCA 30, EF 25% [managed with  hypothermia protocol]  . ST elevation (STEMI) myocardial infarction involving left anterior descending coronary artery ([place]) 11/11/15    100% LAD, Cardiogenic Shock. -- EF ~20-25%  . Ischemic cardiomyopathy     a. EF at [facility] 11/11/15 25%;  b. Echo 11/14/15: EF 50-55%, dist ant and anterior septal HK  . History of Clostridium difficile colitis 10/2015    post MI >> required IV Flagyl and IV Vanc >> DC on po Vanc  . HLD (hyperlipidemia)     Past Surgical History  Procedure Laterality Date  . Status post multiple lithotripy and urological surgery for reccurent calcium oxalate stones    . Cardiac catheterization N/A 11/11/2015    Procedure: Left Heart Cath and Cors/Grafts Angiography;  Surgeon: Sherren Mocha, MD;  Location: [place] [facility];  Service: Cardiovascular;  Laterality: N/A;  . Cardiac catheterization N/A 11/11/2015    Procedure: Coronary Stent Intervention;  Surgeon: Sherren Mocha, MD;  Location: [place] [facility];  Service: Cardiovascular;  Laterality: N/A;  prox lad 3.5x38 promus    Current Outpatient Prescriptions  Medication Sig Dispense Refill  . aspirin 81 MG chewable tablet Chew 1 tablet (81 mg total) by mouth daily.    Marland Kitchen atorvastatin (LIPITOR) 80 MG tablet Take 1 tablet (80 mg total) by mouth daily at 6 PM. 90 tablet 3  . BRILINTA 90 MG TABS tablet TAKE 1 TABLET BY MOUTH TWICE A DAY 180 tablet 3  . cetirizine (ZYRTEC)  10 MG tablet Take 10 mg by mouth daily.    . Cholecalciferol (VITAMIN D-3) 1000 UNITS CAPS Take 5,000 Units by mouth daily.     . famotidine (PEPCID) 20 MG tablet Take 1 tablet (20 mg total) by mouth 2 (two) times daily. 180 tablet 0  . fluticasone (FLONASE) 50 MCG/ACT nasal spray USE 2 SPRAYS INTO NOSE DAILY 48 g 3  . folic acid (FOLVITE) 1 MG tablet Take 1 mg by mouth daily.      Marland Kitchen HYDROcodone-acetaminophen (NORCO/VICODIN) 5-325 MG tablet Take 1-2 tablets by mouth every 6 (six) hours as needed for moderate pain. 60 tablet 0  . metFORMIN (GLUCOPHAGE-XR)  500 MG 24 hr tablet Take 2 tablets (1,000 mg total) by mouth daily with breakfast. 180 tablet 4  . methotrexate (RHEUMATREX) 2.5 MG tablet Take 8 tablets by mouth once a week. Take on Sunday (8 tablets)    . Methotrexate Sodium (METHOTREXATE PO) Take 20 mg by mouth once a week. Once a day on Sunday    . metoprolol tartrate (LOPRESSOR) 25 MG tablet Take 0.5 tablets (12.5 mg total) by mouth 2 (two) times daily. 180 tablet 3  . nitroGLYCERIN (NITROSTAT) 0.4 MG SL tablet Place 1 tablet under the tongue as needed. Every 5 minutes for chest pain  3  . Probiotic Product (PRO-BIOTIC BLEND PO) Take 1 tablet by mouth daily.    . tamsulosin (FLOMAX) 0.4 MG CAPS capsule Take 1 capsule (0.4 mg total) by mouth daily. 90 capsule 0  . traMADol (ULTRAM) 50 MG tablet Take 50 mg by mouth every 6 (six) hours as needed for moderate pain.    . traZODone (DESYREL) 50 MG tablet Take 1 tablet (50 mg total) by mouth at bedtime. 30 tablet 0   No current facility-administered medications for this visit.    Allergies:   Penicillins; Contrast media; Sulfamethoxazole; and Ibuprofen   Social History:  The patient  reports that he has quit smoking. He has never used smokeless tobacco. He reports that he does not drink alcohol or use illicit drugs.   Family History:  The patient's  family history includes Cancer in his mother; Diabetes in his father.   ROS:  Please see the history of present illness.  Otherwise, review of systems is positive for knee, hip, foot pain.  All other systems are reviewed and negative.   PHYSICAL EXAM: VS:  BP 132/70 mmHg  Pulse 73  Ht 5\' 9"  (1.753 m)  Wt 177 lb 6.4 oz (80.468 kg)  BMI 26.19 kg/m2  SpO2 98% , BMI Body mass index is 26.19 kg/(m^2). GEN: Well nourished, well developed, in no acute distress HEENT: normal Neck: no JVD, no masses. No carotid bruits Cardiac: RRR without murmur or gallop                Respiratory:  clear to auscultation bilaterally, normal work of breathing GI:  soft, nontender, nondistended, + BS MS: no deformity or atrophy Ext: no pretibial edema, pedal pulses 2+= bilaterally Skin: warm and dry, no rash Neuro:  Strength and sensation are intact Psych: euthymic mood, full affect  EKG:  EKG is ordered today. The ekg ordered today shows NSR 73 bpm, within normal limits  Recent Labs: 11/12/2015: TSH 0.653 11/29/2015: Magnesium 2.3 01/29/2016: ALT 28; BUN 13; Creat 0.93; Hemoglobin 14.7; Platelets 220; Potassium 3.6; Sodium 138   Lipid Panel     Component Value Date/Time   CHOL 111* 01/29/2016 0757   TRIG 124 01/29/2016 0757  HDL 27* 01/29/2016 0757   CHOLHDL 4.1 01/29/2016 0757   VLDL 25 01/29/2016 0757   LDLCALC 59 01/29/2016 0757      Wt Readings from Last 3 Encounters:  05/31/16 177 lb 6.4 oz (80.468 kg)  05/07/16 180 lb (81.647 kg)  03/01/16 179 lb (81.194 kg)     Cardiac Studies Reviewed: Echo 11-14-2015: Left ventricle: The cavity size was normal. Systolic function was normal. The estimated ejection fraction was in the range of 50% to 55%. There may be hypokinesis of the distal anterior wall and anterior septum. The study is not technically sufficient to allow evaluation of LV diastolic function.  ------------------------------------------------------------------- Aortic valve: Poorly visualized. Normal thickness leaflets. Mobility was not restricted. Doppler: Transvalvular velocity was within the normal range. There was no stenosis. There was no regurgitation.  ------------------------------------------------------------------- Aorta: Aortic root: The aortic root was normal in size.  ------------------------------------------------------------------- Mitral valve: Structurally normal valve. Mobility was not restricted. Doppler: Transvalvular velocity was within the normal range. There was no evidence for stenosis. There was  no regurgitation.  ------------------------------------------------------------------- Left atrium: The atrium was normal in size.  ------------------------------------------------------------------- Right ventricle: The cavity size was normal. Wall thickness was normal. Systolic function was normal.  ------------------------------------------------------------------- Pulmonic valve: Poorly visualized. Doppler: Transvalvular velocity was within the normal range. There was no evidence for stenosis.  ------------------------------------------------------------------- Tricuspid valve: Structurally normal valve. Leaflet separation was normal. Doppler: Transvalvular velocity was within the normal range. There was no regurgitation.  ------------------------------------------------------------------- Pulmonary artery: Poorly visualized. The main pulmonary artery was normal-sized. Systolic pressure was within the normal range.  ------------------------------------------------------------------- Right atrium: The atrium was normal in size.  ------------------------------------------------------------------- Pericardium: There was no pericardial effusion.  ------------------------------------------------------------------- Systemic veins: Inferior vena cava: The vessel was normal in size. The respirophasic diameter changes were in the normal range (= 50%), consistent with normal central venous pressure.   ASSESSMENT AND PLAN: 1.  CAD, native vessel, with old MI: doing well with no anginal sx's. Medications reviewed and no changes today. I'll see him back in 6 months and likely discontinue brilinta at that point. He understands that Brilinta may be the reason he is feeling short of breath at times as this is a common side effect.  2. Hyperlipidemia: Lipids reviewed and at goal. He will continue on his current statin drug.  3. Essential hypertension: Blood  pressure is well controlled on metoprolol  4. Type II DM: He has started metformin. Should consider starting an ARB when he follows up.  Current medicines are reviewed with the patient today.  The patient does not have concerns regarding medicines.  Labs/ tests ordered today include:   Orders Placed This Encounter  Procedures  . EKG 12-Lead    Disposition:   FU 6 months  Signed, Sherren Mocha, MD  05/31/2016 5:05 [facility] [place], [place], [place]  [address] Phone: [phone]; Fax: [phone]

## 2016-05-31 NOTE — Patient Instructions (Signed)

## 2016-07-14 DIAGNOSIS — M25511 Pain in right shoulder: Secondary | ICD-10-CM | POA: Diagnosis not present

## 2016-07-21 DIAGNOSIS — M25511 Pain in right shoulder: Secondary | ICD-10-CM | POA: Diagnosis not present

## 2016-07-23 ENCOUNTER — Other Ambulatory Visit: Payer: Self-pay | Admitting: Internal Medicine

## 2016-07-23 DIAGNOSIS — M25511 Pain in right shoulder: Secondary | ICD-10-CM | POA: Diagnosis not present

## 2016-07-23 DIAGNOSIS — M0589 Other rheumatoid arthritis with rheumatoid factor of multiple sites: Secondary | ICD-10-CM | POA: Diagnosis not present

## 2016-07-23 DIAGNOSIS — M150 Primary generalized (osteo)arthritis: Secondary | ICD-10-CM | POA: Diagnosis not present

## 2016-08-06 ENCOUNTER — Ambulatory Visit: Payer: Medicare Other | Admitting: Internal Medicine

## 2016-08-06 ENCOUNTER — Other Ambulatory Visit: Payer: Self-pay

## 2016-08-08 ENCOUNTER — Other Ambulatory Visit: Payer: Self-pay | Admitting: Physician Assistant

## 2016-08-09 ENCOUNTER — Encounter: Payer: Self-pay | Admitting: Internal Medicine

## 2016-08-09 ENCOUNTER — Ambulatory Visit (INDEPENDENT_AMBULATORY_CARE_PROVIDER_SITE_OTHER): Payer: BLUE CROSS/BLUE SHIELD | Admitting: Internal Medicine

## 2016-08-09 VITALS — BP 132/80 | HR 76 | Temp 98.7°F | Resp 20 | Ht 69.0 in | Wt 173.0 lb

## 2016-08-09 DIAGNOSIS — E1151 Type 2 diabetes mellitus with diabetic peripheral angiopathy without gangrene: Secondary | ICD-10-CM

## 2016-08-09 DIAGNOSIS — I2510 Atherosclerotic heart disease of native coronary artery without angina pectoris: Secondary | ICD-10-CM | POA: Diagnosis not present

## 2016-08-09 DIAGNOSIS — I255 Ischemic cardiomyopathy: Secondary | ICD-10-CM | POA: Diagnosis not present

## 2016-08-09 DIAGNOSIS — I10 Essential (primary) hypertension: Secondary | ICD-10-CM

## 2016-08-09 DIAGNOSIS — Z0000 Encounter for general adult medical examination without abnormal findings: Secondary | ICD-10-CM

## 2016-08-09 DIAGNOSIS — Z9861 Coronary angioplasty status: Secondary | ICD-10-CM

## 2016-08-09 LAB — HEMOGLOBIN A1C: HEMOGLOBIN A1C: 6.5 % (ref 4.6–6.5)

## 2016-08-09 NOTE — Progress Notes (Signed)
Subjective:    Patient ID: [name], male    DOB: [birth year], 70 y.o.   MRN: [id number]  HPI  70 year old patient seen today for follow-up of type 2 diabetes.  He is followed by rheumatology and cardiology.  In general doing quite well.  Last visit he was placed on methotrexate, which she continues to tolerate with a split dose.  Blood sugars have improved nicely. No concerns or complaints  Past Medical History:  Diagnosis Date  . Allergic rhinitis   . CAD (coronary artery disease)    a. 11/11/15 - OOH VF arrest 2/2 ant STEMI >> CPR//AED shock x 2 >> ROSC >> LHC: pLAD 100 (Promus DES), mLCx 50, mRCA 30, EF 25% [managed with hypothermia protocol]  . Fractured rib   . History of Clostridium difficile colitis 10/2015   post MI >> required IV Flagyl and IV Vanc >> DC on po Vanc  . HLD (hyperlipidemia)   . Hypertension   . Impaired glucose tolerance   . Insomnia   . Ischemic cardiomyopathy    a. EF at [facility] 11/11/15 25%;  b. Echo 11/14/15: EF 50-55%, dist ant and anterior septal HK  . Nephrolithiasis 1968  . Pelvic fracture ([place]) 09/2015  . Rheumatoid arthritis(714.0) 1998  . ST elevation (STEMI) myocardial infarction involving left anterior descending coronary artery ([place]) 11/11/15   100% LAD, Cardiogenic Shock. -- EF ~20-25%     Social History   Social History  . Marital status: Married    Spouse name: N/A  . Number of children: N/A  . Years of education: N/A   Occupational History  . Not on file.   Social History Main Topics  . 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 Smokeless tobacco: Never Used     Comment: quit 30 yrs ago  . Alcohol use No  . Drug use: No  . Sexual activity: Not on file   Other Topics Concern  . Not on file   Social History Narrative  . No narrative on file    Past Surgical History:  Procedure Laterality Date  . CARDIAC CATHETERIZATION N/A 11/11/2015   Procedure: Left Heart Cath and Cors/Grafts Angiography;  Surgeon: Sherren Mocha, MD;   Location: [place] [facility];  Service: Cardiovascular;  Laterality: N/A;  . CARDIAC CATHETERIZATION N/A 11/11/2015   Procedure: Coronary Stent Intervention;  Surgeon: Sherren Mocha, MD;  Location: [place] [facility];  Service: Cardiovascular;  Laterality: N/A;  prox lad 3.5x38 promus  . status post multiple lithotripy and urological surgery for reccurent calcium oxalate stones      Family History  Problem Relation Age of Onset  . Cancer Mother     pancreatic ; pulmonary embolism   . Diabetes Father     Allergies  Allergen Reactions  . Penicillins Anaphylaxis  . Contrast Media [Iodinated Diagnostic Agents] Hives    03/28/14 pt received 1 hour emergent prep and pt did good and had no complaints after scan when given IV benadryl first. Had reaction 1 time, tolerated fine in 03/2014 with Benadryl  . Sulfamethoxazole Hives    Childhood reaction  . Ibuprofen Swelling    Happened 1 time    Current Outpatient Prescriptions on File Prior to Visit  Medication Sig Dispense Refill  . aspirin 81 MG chewable tablet Chew 1 tablet (81 mg total) by mouth daily.    Marland Kitchen atorvastatin (LIPITOR) 80 MG tablet Take 1 tablet (80 mg total) by mouth daily at 6 PM. 90 tablet 3  .  BRILINTA 90 MG TABS tablet TAKE 1 TABLET BY MOUTH TWICE A DAY 180 tablet 3  . cetirizine (ZYRTEC) 10 MG tablet Take 10 mg by mouth daily.    . Cholecalciferol (VITAMIN D-3) 1000 UNITS CAPS Take 5,000 Units by mouth daily.     . famotidine (PEPCID) 20 MG tablet Take 1 tablet (20 mg total) by mouth 2 (two) times daily. 180 tablet 0  . fluticasone (FLONASE) 50 MCG/ACT nasal spray USE 2 SPRAYS INTO NOSE DAILY 48 g 3  . folic acid (FOLVITE) 1 MG tablet Take 1 mg by mouth daily.      Marland Kitchen HYDROcodone-acetaminophen (NORCO/VICODIN) 5-325 MG tablet Take 1-2 tablets by mouth every 6 (six) hours as needed for moderate pain. 60 tablet 0  . metFORMIN (GLUCOPHAGE-XR) 500 MG 24 hr tablet Take 2 tablets (1,000 mg total) by mouth daily with breakfast.  (Patient taking differently: Take 500 mg by mouth 2 (two) times daily. ) 180 tablet 4  . methotrexate (RHEUMATREX) 2.5 MG tablet Take 8 tablets by mouth once a week. Take on Sunday (8 tablets)    . Methotrexate Sodium (METHOTREXATE PO) Take 20 mg by mouth once a week. Once a day on Sunday    . metoprolol tartrate (LOPRESSOR) 25 MG tablet Take 0.5 tablets (12.5 mg total) by mouth 2 (two) times daily. 180 tablet 3  . nitroGLYCERIN (NITROSTAT) 0.4 MG SL tablet Place 1 tablet under the tongue as needed. Every 5 minutes for chest pain  3  . Probiotic Product (PRO-BIOTIC BLEND PO) Take 1 tablet by mouth daily.    . tamsulosin (FLOMAX) 0.4 MG CAPS capsule Take 1 capsule by mouth  daily 90 capsule 4  . traMADol (ULTRAM) 50 MG tablet Take 50 mg by mouth every 6 (six) hours as needed for moderate pain.    . traZODone (DESYREL) 50 MG tablet Take 1 tablet (50 mg total) by mouth at bedtime. 30 tablet 0   No current facility-administered medications on file prior to visit.     BP 132/80 (BP Location: Left Arm, Patient Position: Sitting, Cuff Size: Normal)   Pulse 76   Temp 98.7 F (37.1 C) (Oral)   Resp 20   Ht 5\' 9"  (1.753 m)   Wt 173 lb (78.5 kg)   SpO2 97%   BMI 25.55 kg/m     Review of Systems  Constitutional: Negative for appetite change, chills, fatigue and fever.  HENT: Negative for congestion, dental problem, ear pain, hearing loss, sore throat, tinnitus, trouble swallowing and voice change.   Eyes: Negative for pain, discharge and visual disturbance.  Respiratory: Negative for cough, chest tightness, wheezing and stridor.   Cardiovascular: Negative for chest pain, palpitations and leg swelling.  Gastrointestinal: Negative for abdominal distention, abdominal pain, blood in stool, constipation, diarrhea, nausea and vomiting.  Genitourinary: Negative for difficulty urinating, discharge, flank pain, genital sores, hematuria and urgency.  Musculoskeletal: Negative for arthralgias, back pain,  gait problem, joint swelling, myalgias and neck stiffness.       Bilateral shoulder pain right greater than left  Skin: Negative for rash.  Neurological: Negative for dizziness, syncope, speech difficulty, weakness, numbness and headaches.  Hematological: Negative for adenopathy. Does not bruise/bleed easily.  Psychiatric/Behavioral: Negative for behavioral problems and dysphoric mood. The patient is not nervous/anxious.        Objective:   Physical Exam  Constitutional: He is oriented to person, place, and time. He appears well-developed.  HENT:  Head: Normocephalic.  Right Ear: External ear normal.  Left Ear: External ear normal.  Eyes: Conjunctivae and EOM are normal.  Neck: Normal range of motion.  Cardiovascular: Normal rate and normal heart sounds.   Pulmonary/Chest: Breath sounds normal.  Abdominal: Bowel sounds are normal.  Musculoskeletal: Normal range of motion. He exhibits no edema or tenderness.  Neurological: He is alert and oriented to person, place, and time.  Psychiatric: He has a normal mood and affect. His behavior is normal.          Assessment & Plan:  Diabetes mellitus.  Will check hemoglobin A1c.  Continue metformin therapy Coronary artery disease/ischemic cardio myopathy.  Follow-up cardiology in December as scheduled RA.  Follow-up rheumatology   [name], MD

## 2016-08-09 NOTE — Telephone Encounter (Signed)
Review for refill, Thank you. 

## 2016-08-09 NOTE — Patient Instructions (Signed)
Limit your sodium (Salt) intake    It is important that you exercise regularly, at least 20 minutes 3 to 4 times per week.  If you develop chest pain or shortness of breath seek  medical attention.   Please check your hemoglobin A1c every 3-6  months  

## 2016-08-09 NOTE — Progress Notes (Signed)
   Subjective:    Patient ID: [name], male    DOB: July 23, 1946, 70 y.o.   MRN: [id number]  HPI  Lab Results  Component Value Date   HGBA1C 6.8 (H) 05/07/2016    Review of Systems     Objective:   Physical Exam        Assessment & Plan:

## 2016-08-09 NOTE — Progress Notes (Signed)
Pre visit review using our clinic review tool, if applicable. No additional management support is needed unless otherwise documented below in the visit note. 

## 2016-08-10 LAB — HEPATITIS C ANTIBODY: HCV AB: NEGATIVE

## 2016-09-14 ENCOUNTER — Telehealth: Payer: Self-pay | Admitting: Internal Medicine

## 2016-09-14 MED ORDER — TRAZODONE HCL 50 MG PO TABS: 50.0000 mg | ORAL_TABLET | Freq: Every day | ORAL | 1 refills | Status: DC

## 2016-09-14 NOTE — Telephone Encounter (Signed)
°  Pt need a brand new rx     traZODone (DESYREL) 50 MG tablet  90 DAY MAIL ORDER     Pharamcy [facility]

## 2016-09-14 NOTE — Telephone Encounter (Signed)
Left message on voicemail to call office. Need to clarify mail order pharmacy.

## 2016-09-14 NOTE — Telephone Encounter (Signed)
Pt's wife called back left message on my voicemail. Called Neoma Laming back and asked her what mail order is pt using. She said [facility] or [facility]. Told her can only find [facility] I will send Rx there. Neoma Laming verbalized understanding. Rx sent

## 2016-09-23 DIAGNOSIS — Z23 Encounter for immunization: Secondary | ICD-10-CM | POA: Diagnosis not present

## 2016-10-25 ENCOUNTER — Encounter: Payer: Self-pay | Admitting: Cardiovascular Disease

## 2016-11-03 ENCOUNTER — Encounter: Payer: Self-pay | Admitting: *Deleted

## 2016-11-15 ENCOUNTER — Ambulatory Visit (INDEPENDENT_AMBULATORY_CARE_PROVIDER_SITE_OTHER): Payer: BLUE CROSS/BLUE SHIELD | Admitting: Cardiovascular Disease

## 2016-11-15 ENCOUNTER — Encounter: Payer: Self-pay | Admitting: Cardiovascular Disease

## 2016-11-15 VITALS — BP 122/68 | HR 72 | Ht 70.0 in | Wt 178.0 lb

## 2016-11-15 DIAGNOSIS — I252 Old myocardial infarction: Secondary | ICD-10-CM

## 2016-11-15 DIAGNOSIS — I255 Ischemic cardiomyopathy: Secondary | ICD-10-CM

## 2016-11-15 NOTE — Progress Notes (Signed)
Cardiology Office Note Date:  11/15/2016   ID:  Alen, [address] 19-Jul-1946, MRN [id number]  PCP:  [name], MD  Cardiologist:  Sherren Mocha, MD    Chief Complaint  Patient presents with  . Coronary Artery Disease     History of Present Illness: [name] is a 70 y.o. male who presents for after sustaining an anterior MI complicated by out-of-hospital cardiac arrest in November 2016. He had a very long hospitalization but ultimately had an excellent recovery. Initial LVEF 25% by cath but quickly improved to 55% just a few days later by echo.  Overall the patient is doing well from a cardiac perspective. He has fully retired from his job at [employer]. He had significant limitation related to his arthritis especially when he was working actively. He is compliant with his medical program. He denies chest pain, chest pressure, shortness of breath, leg swelling, or heart palpitations.   Past Medical History:  Diagnosis Date  . Allergic rhinitis   . CAD (coronary artery disease)    a. 11/11/15 - OOH VF arrest 2/2 ant STEMI >> CPR//AED shock x 2 >> ROSC >> LHC: pLAD 100 (Promus DES), mLCx 50, mRCA 30, EF 25% [managed with hypothermia protocol]  . Fractured rib   . History of Clostridium difficile colitis 10/2015   post MI >> required IV Flagyl and IV Vanc >> DC on po Vanc  . HLD (hyperlipidemia)   . Hypertension   . Impaired glucose tolerance   . Insomnia   . Ischemic cardiomyopathy    a. EF at [facility] 11/11/15 25%;  b. Echo 11/14/15: EF 50-55%, dist ant and anterior septal HK  . Nephrolithiasis 1968  . Pelvic fracture ([place]) 09/2015  . Rheumatoid arthritis(714.0) 1998  . ST elevation (STEMI) myocardial infarction involving left anterior descending coronary artery ([place]) 11/11/15   100% LAD, Cardiogenic Shock. -- EF ~20-25%    Past Surgical History:  Procedure Laterality Date  . CARDIAC CATHETERIZATION N/A 11/11/2015   Procedure: Left Heart Cath and  Cors/Grafts Angiography;  Surgeon: Sherren Mocha, MD;  Location: [facility];  Service: Cardiovascular;  Laterality: N/A;  . CARDIAC CATHETERIZATION N/A 11/11/2015   Procedure: Coronary Stent Intervention;  Surgeon: Sherren Mocha, MD;  Location: [facility];  Service: Cardiovascular;  Laterality: N/A;  prox lad 3.5x38 promus  . status post multiple lithotripy and urological surgery for reccurent calcium oxalate stones      Current Outpatient Prescriptions  Medication Sig Dispense Refill  . aspirin 81 MG chewable tablet Chew 1 tablet (81 mg total) by mouth daily.    Marland Kitchen atorvastatin (LIPITOR) 80 MG tablet Take 1 tablet by mouth  daily at 6 P.M. 90 tablet 1  . cetirizine (ZYRTEC) 10 MG tablet Take 10 mg by mouth daily.    . Cholecalciferol (VITAMIN D-3) 1000 UNITS CAPS Take 5,000 Units by mouth daily.     . famotidine (PEPCID) 20 MG tablet Take 1 tablet (20 mg total) by mouth 2 (two) times daily. 180 tablet 0  . fluticasone (FLONASE) 50 MCG/ACT nasal spray USE 2 SPRAYS INTO NOSE DAILY 48 g 3  . folic acid (FOLVITE) 1 MG tablet Take 1 mg by mouth daily.      Marland Kitchen HYDROcodone-acetaminophen (NORCO/VICODIN) 5-325 MG tablet Take 1-2 tablets by mouth every 6 (six) hours as needed for moderate pain. 60 tablet 0  . metFORMIN (GLUCOPHAGE-XR) 500 MG 24 hr tablet Take 1,000 mg by mouth 2 (two) times daily.    Marland Kitchen  methotrexate (RHEUMATREX) 2.5 MG tablet Take 8 tablets by mouth once a week. Take on Sunday (8 tablets)    . Methotrexate Sodium (METHOTREXATE PO) Take 20 mg by mouth once a week. Once a day on Sunday    . metoprolol tartrate (LOPRESSOR) 25 MG tablet Take 0.5 tablets (12.5 mg total) by mouth 2 (two) times daily. 180 tablet 3  . nitroGLYCERIN (NITROSTAT) 0.4 MG SL tablet Place 1 tablet under the tongue as needed. Every 5 minutes for chest pain  3  . Probiotic Product (PRO-BIOTIC BLEND PO) Take 1 tablet by mouth daily.    . tamsulosin (FLOMAX) 0.4 MG CAPS capsule Take 1 capsule by mouth   daily 90 capsule 4  . traMADol (ULTRAM) 50 MG tablet Take 50 mg by mouth every 6 (six) hours as needed for moderate pain.    . traZODone (DESYREL) 50 MG tablet Take 1 tablet (50 mg total) by mouth at bedtime. 90 tablet 1   No current facility-administered medications for this visit.     Allergies:   Penicillins; Contrast media [iodinated diagnostic agents]; Sulfamethoxazole; and Ibuprofen   Social History:  The patient  reports that he has quit smoking. He has never used smokeless tobacco. He reports that he does not drink alcohol or use drugs.   Family History:  The patient's  family history includes Cancer in his mother; Diabetes in his father.    ROS:  Please see the history of present illness.  Otherwise, review of systems is positive for joint pains.  All other systems are reviewed and negative.    PHYSICAL EXAM: VS:  BP 122/68   Pulse 72   Ht 5\' 10"  (1.778 m)   Wt 178 lb (80.7 kg)   BMI 25.54 kg/m  , BMI Body mass index is 25.54 kg/m. GEN: Well nourished, well developed, in no acute distress  HEENT: normal  Neck: no JVD, no masses. No carotid bruits Cardiac: RRR without murmur or gallop                Respiratory:  clear to auscultation bilaterally, normal work of breathing GI: soft, nontender, nondistended, + BS MS: no deformity or atrophy  Ext: no pretibial edema, pedal pulses 2+= bilaterally Skin: warm and dry, no rash Neuro:  Strength and sensation are intact Psych: euthymic mood, full affect  EKG:  EKG is ordered today. The ekg ordered today shows normal sinus rhythm 72 bpm, nonspecific T wave abnormality.  Recent Labs: 11/29/2015: Magnesium 2.3 01/29/2016: ALT 28; BUN 13; Creat 0.93; Hemoglobin 14.7; Platelets 220; Potassium 3.6; Sodium 138   Lipid Panel     Component Value Date/Time   CHOL 111 (L) 01/29/2016 0757   TRIG 124 01/29/2016 0757   HDL 27 (L) 01/29/2016 0757   CHOLHDL 4.1 01/29/2016 0757   VLDL 25 01/29/2016 0757   LDLCALC 59 01/29/2016 0757        Wt Readings from Last 3 Encounters:  11/15/16 178 lb (80.7 kg)  08/09/16 173 lb (78.5 kg)  05/31/16 177 lb 6.4 oz (80.5 kg)     Cardiac Studies Reviewed: Echo 11-14-2015: Left ventricle: The cavity size was normal. Systolic function was normal. The estimated ejection fraction was in the range of 50% to 55%. There may be hypokinesis of the distal anterior wall and anterior septum. The study is not technically sufficient to allow evaluation of LV diastolic function.  ------------------------------------------------------------------- Aortic valve: Poorly visualized. Normal thickness leaflets. Mobility was not restricted. Doppler: Transvalvular velocity was within  the normal range. There was no stenosis. There was no regurgitation.  ------------------------------------------------------------------- Aorta: Aortic root: The aortic root was normal in size.  ------------------------------------------------------------------- Mitral valve: Structurally normal valve. Mobility was not restricted. Doppler: Transvalvular velocity was within the normal range. There was no evidence for stenosis. There was no regurgitation.  ------------------------------------------------------------------- Left atrium: The atrium was normal in size.  ------------------------------------------------------------------- Right ventricle: The cavity size was normal. Wall thickness was normal. Systolic function was normal.  ------------------------------------------------------------------- Pulmonic valve: Poorly visualized. Doppler: Transvalvular velocity was within the normal range. There was no evidence for stenosis.  ------------------------------------------------------------------- Tricuspid valve: Structurally normal valve. Leaflet separation was normal. Doppler: Transvalvular velocity was within the normal range. There was no  regurgitation.  ------------------------------------------------------------------- Pulmonary artery: Poorly visualized. The main pulmonary artery was normal-sized. Systolic pressure was within the normal range.  ------------------------------------------------------------------- Right atrium: The atrium was normal in size.  ------------------------------------------------------------------- Pericardium: There was no pericardial effusion.  ------------------------------------------------------------------- Systemic veins: Inferior vena cava: The vessel was normal in size. The respirophasic diameter changes were in the normal range (= 50%), consistent with normal central venous pressure.  Cath 11-11-2015: Procedures   Coronary Stent Intervention  Left Heart Cath and Cors/Grafts Angiography  Conclusion   1. Total occlusion of the proximal LAD, treated successfully with primary PCI (drug-eluting stent) 2. Nonobstructive LCx and RCA stenoses 3. Severe segmental LV systolic dysfunction, LVEF 25%  Recommendations: hypothermia protocol per CCM team, post-MI medical therapy and supportive care in CCU setting.  Indications   ST elevation myocardial infarction involving left anterior descending (LAD) coronary artery ([name]) [I21.02 ([id number]  Complications   Complications documented in old activity   INDICATION: Anterior STEMI with out-of-hospital VF arrest   PROCEDURAL DETAILS: The right wrist was prepped, draped, and anesthetized with 1% lidocaine. Using the modified Seldinger technique, a 5/6 French Slender sheath was introduced into the right radial artery. 3 mg of verapamil was administered through the sheath, weight-based unfractionated heparin was administered intravenously. Standard Judkins catheters were used for selective coronary angiography and left ventriculography. Catheter exchanges were performed over an exchange length guidewire. PCI is performed - see PCI  note below. There were no immediate procedural complications. A TR band was used for radial hemostasis at the completion of the procedure. The patient was transferred to the post catheterization recovery area for further monitoring.   Estimated blood loss <50 mL. There were no immediate complications during the procedure.      Coronary Findings   Dominance: Right  Left Anterior Descending  Prox LAD lesion, 100% stenosed. Thrombotic.  PCI: There is no pre-interventional antegrade distal flow (TIMI 0). Pre-stent angioplasty was performed. A drug-eluting stent was placed. Post-stent angioplasty was performed. The post-interventional distal flow is normal (TIMI 3). The intervention was successful. No complications occurred at this lesion. The proximal LAD is 100% occluded with TIMI-0 flow. Heparin and aggrastat are used for systemic anticoagulation. Brilinta 180 mg is administered via the OG tube. An XB-LAD guide is used and a Buyer, retail is used to cross the occlusion. The LAD is treated with a 2.5x12 mm balloon on multiple inflations. Angiography shows severe diffuse proximal LAD stenosis post-angioplasty. The vessel is stented with a 3.5x38 mm Promus DES and post-dilated with a 3.75 mm [place] balloon.  There is no residual stenosis post intervention.  Left Circumflex  Mid Cx lesion, 50% stenosed.  Right Coronary Artery  Mid RCA lesion, 30% stenosed.  Wall Motion  Left Heart   Left Ventricle The left ventricular size is normal. There is severe left ventricular systolic dysfunction. The left ventricular ejection fraction is 25-35% by visual estimate. There are wall motion abnormalities in the left ventricle. There are segmental wall motion abnormalities in the left ventricle. The anterolateral and periapical LV walls are akinetic with an estimated LVEF of 25%    Coronary Diagrams   Diagnostic Diagram     Post-Intervention Diagram        ASSESSMENT AND PLAN: 1.  CAD,  native vessel, with old MI: doing well with no anginal sx's. The patient is 12 months out from his MI. Will discontinue Brilinta based on current guideline recommendations. He will remain on aspirin 81 mg daily.  2. Hyperlipidemia: Lipids reviewed and at goal. He will continue on atorvastatin 80 mg.  3. Essential hypertension: Blood pressure is well controlled on metoprolol  4. Type II DM: Tolerating metformin. Most recent hemoglobin A1c is improved at 6.5 mg/dL. He follows a prudent diet.  5. Rheumatoid arthritis/osteoarthritis: The patient has significant limitation related to arthritis. I have reviewed Dr. Melissa Noon notes. The patient had much better quality of life when he was on meloxicam. We reviewed the cardiovascular risks involved with meloxicam which the absolute risk increase is actually quite low. I think it would be reasonable for the patient to try meloxicam for symptomatic improvement of his arthritis.   Current medicines are reviewed with the patient today.  The patient does not have concerns regarding medicines.  Labs/ tests ordered today include:  No orders of the defined types were placed in this encounter.   Disposition:   FU 6 months, Stop Brilinta, Continue ASA and other cardiac medications, Discuss use of Meloxicam with Dr Amil Amen as planned.  [name], MD  11/15/2016 10:42 AM    [facility] [place], [place], [place]  [address] Phone: [phone]; Fax: [phone]

## 2016-11-15 NOTE — Patient Instructions (Signed)
Stop Brilinta    Your physician wants you to follow-up in: 6 months. You will receive a reminder letter in the mail two months in advance. If you don't receive a letter, please call our office to schedule the follow-up appointment.

## 2016-12-23 ENCOUNTER — Other Ambulatory Visit: Payer: Self-pay | Admitting: Internal Medicine

## 2016-12-23 ENCOUNTER — Other Ambulatory Visit: Payer: Self-pay | Admitting: Physician Assistant

## 2016-12-23 NOTE — Telephone Encounter (Signed)
Please review for refill. Thanks!  

## 2017-01-14 ENCOUNTER — Other Ambulatory Visit: Payer: Self-pay | Admitting: Internal Medicine

## 2017-01-25 DIAGNOSIS — E663 Overweight: Secondary | ICD-10-CM | POA: Diagnosis not present

## 2017-01-25 DIAGNOSIS — M0589 Other rheumatoid arthritis with rheumatoid factor of multiple sites: Secondary | ICD-10-CM | POA: Diagnosis not present

## 2017-01-25 DIAGNOSIS — M150 Primary generalized (osteo)arthritis: Secondary | ICD-10-CM | POA: Diagnosis not present

## 2017-01-25 DIAGNOSIS — Z6826 Body mass index (BMI) 26.0-26.9, adult: Secondary | ICD-10-CM | POA: Diagnosis not present

## 2017-01-25 DIAGNOSIS — M25511 Pain in right shoulder: Secondary | ICD-10-CM | POA: Diagnosis not present

## 2017-01-27 ENCOUNTER — Encounter: Payer: Self-pay | Admitting: Internal Medicine

## 2017-01-27 ENCOUNTER — Ambulatory Visit (INDEPENDENT_AMBULATORY_CARE_PROVIDER_SITE_OTHER): Payer: 59 | Admitting: Internal Medicine

## 2017-01-27 VITALS — BP 122/58 | HR 58 | Temp 98.3°F | Ht 70.0 in | Wt 182.2 lb

## 2017-01-27 DIAGNOSIS — I255 Ischemic cardiomyopathy: Secondary | ICD-10-CM

## 2017-01-27 DIAGNOSIS — E1151 Type 2 diabetes mellitus with diabetic peripheral angiopathy without gangrene: Secondary | ICD-10-CM | POA: Diagnosis not present

## 2017-01-27 DIAGNOSIS — C4491 Basal cell carcinoma of skin, unspecified: Secondary | ICD-10-CM | POA: Diagnosis not present

## 2017-01-27 DIAGNOSIS — I10 Essential (primary) hypertension: Secondary | ICD-10-CM | POA: Diagnosis not present

## 2017-01-27 DIAGNOSIS — E739 Lactose intolerance, unspecified: Secondary | ICD-10-CM | POA: Diagnosis not present

## 2017-01-27 LAB — HEMOGLOBIN A1C: Hgb A1c MFr Bld: 6.6 % — ABNORMAL HIGH (ref 4.6–6.5)

## 2017-01-27 NOTE — Patient Instructions (Signed)
Limit your sodium (Salt) intake    It is important that you exercise regularly, at least 20 minutes 3 to 4 times per week.  If you develop chest pain or shortness of breath seek  medical attention.   Please check your hemoglobin A1c every 6  Months  Dermatology follow-up as discussed

## 2017-01-27 NOTE — Progress Notes (Signed)
Pre visit review using our clinic review tool, if applicable. No additional management support is needed unless otherwise documented below in the visit note. 

## 2017-01-27 NOTE — Progress Notes (Signed)
Subjective:    Patient ID: [name], male    DOB: 03-Apr-1946, 71 y.o.   MRN: [id number]  HPI  71 year old patient who is followed closely by cardiology with coronary artery disease.  He is seen today for follow-up of type 2 diabetes.  Presently this has been well-controlled on metformin therapy only.  Lab Results  Component Value Date   HGBA1C 6.5 08/09/2016   He continues to monitor home blood pressure readings with very nice glycemic control. He is followed by rheumatology for rheumatoid arthritis and this remains fairly stable  He remains quite active, walking and exercise at his health club almost daily  Past Medical History:  Diagnosis Date  . Allergic rhinitis   . CAD (coronary artery disease)    a. 11/11/15 - OOH VF arrest 2/2 ant STEMI >> CPR//AED shock x 2 >> ROSC >> LHC: pLAD 100 (Promus DES), mLCx 50, mRCA 30, EF 25% [managed with hypothermia protocol]  . Fractured rib   . History of Clostridium difficile colitis 10/2015   post MI >> required IV Flagyl and IV Vanc >> DC on po Vanc  . HLD (hyperlipidemia)   . Hypertension   . Impaired glucose tolerance   . Insomnia   . Ischemic cardiomyopathy    a. EF at [facility] 11/11/15 25%;  b. Echo 11/14/15: EF 50-55%, dist ant and anterior septal HK  . Nephrolithiasis 1968  . Pelvic fracture ([place]) 09/2015  . Rheumatoid arthritis(714.0) 1998  . ST elevation (STEMI) myocardial infarction involving left anterior descending coronary artery ([place]) 11/11/15   100% LAD, Cardiogenic Shock. -- EF ~20-25%     Social History   Social History  . Marital status: Married    Spouse name: N/A  . Number of children: N/A  . Years of education: N/A   Occupational History  . Not on file.   Social History Main Topics  . 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 Smokeless tobacco: Never Used     Comment: quit 30 yrs ago  . Alcohol use No  . Drug use: No  . Sexual activity: Not on file   Other Topics Concern  . Not on file   Social  History Narrative  . No narrative on file    Past Surgical History:  Procedure Laterality Date  . CARDIAC CATHETERIZATION N/A 11/11/2015   Procedure: Left Heart Cath and Cors/Grafts Angiography;  Surgeon: Sherren Mocha, MD;  Location: [facility];  Service: Cardiovascular;  Laterality: N/A;  . CARDIAC CATHETERIZATION N/A 11/11/2015   Procedure: Coronary Stent Intervention;  Surgeon: Sherren Mocha, MD;  Location: [facility];  Service: Cardiovascular;  Laterality: N/A;  prox lad 3.5x38 promus  . status post multiple lithotripy and urological surgery for reccurent calcium oxalate stones      Family History  Problem Relation Age of Onset  . Cancer Mother     pancreatic ; pulmonary embolism   . Diabetes Father     Allergies  Allergen Reactions  . Penicillins Anaphylaxis  . Contrast Media [Iodinated Diagnostic Agents] Hives    03/28/14 pt received 1 hour emergent prep and pt did good and had no complaints after scan when given IV benadryl first. Had reaction 1 time, tolerated fine in 03/2014 with Benadryl  . Sulfamethoxazole Hives    Childhood reaction  . Ibuprofen Swelling    Happened 1 time    Current Outpatient Prescriptions on File Prior to Visit  Medication Sig Dispense Refill  . aspirin 81 MG  chewable tablet Chew 1 tablet (81 mg total) by mouth daily.    Marland Kitchen atorvastatin (LIPITOR) 80 MG tablet Take 1 tablet by mouth  daily at 6 P.M. 90 tablet 1  . cetirizine (ZYRTEC) 10 MG tablet Take 10 mg by mouth daily.    . Cholecalciferol (VITAMIN D-3) 1000 UNITS CAPS Take 5,000 Units by mouth daily.     . famotidine (PEPCID) 20 MG tablet Take 1 tablet (20 mg total) by mouth 2 (two) times daily. 180 tablet 0  . fluticasone (FLONASE) 50 MCG/ACT nasal spray USE 2 SPRAYS IN EACH  NOSTRIL DAILY 48 g 3  . folic acid (FOLVITE) 1 MG tablet Take 1 mg by mouth daily.      Marland Kitchen HYDROcodone-acetaminophen (NORCO/VICODIN) 5-325 MG tablet Take 1-2 tablets by mouth every 6 (six) hours as  needed for moderate pain. 60 tablet 0  . metFORMIN (GLUCOPHAGE-XR) 500 MG 24 hr tablet Take 1,000 mg by mouth 2 (two) times daily.    . methotrexate (RHEUMATREX) 2.5 MG tablet Take 8 tablets by mouth once a week. Take on Sunday (8 tablets)    . Methotrexate Sodium (METHOTREXATE PO) Take 20 mg by mouth once a week. Once a day on Sunday    . metoprolol tartrate (LOPRESSOR) 25 MG tablet TAKE ONE-HALF TABLET BY  MOUTH TWO TIMES DAILY 90 tablet 3  . nitroGLYCERIN (NITROSTAT) 0.4 MG SL tablet Place 1 tablet under the tongue as needed. Every 5 minutes for chest pain  3  . Probiotic Product (PRO-BIOTIC BLEND PO) Take 1 tablet by mouth daily.    . tamsulosin (FLOMAX) 0.4 MG CAPS capsule Take 1 capsule by mouth  daily 90 capsule 4  . traMADol (ULTRAM) 50 MG tablet Take 50 mg by mouth every 6 (six) hours as needed for moderate pain.    . traZODone (DESYREL) 50 MG tablet TAKE 1 TABLET BY MOUTH AT  BEDTIME 90 tablet 0   No current facility-administered medications on file prior to visit.     BP (!) 122/58 (BP Location: Left Arm, Patient Position: Sitting, Cuff Size: Normal)   Pulse (!) 58   Temp 98.3 F (36.8 C) (Oral)   Ht 5\' 10"  (1.778 m)   Wt 182 lb 3.2 oz (82.6 kg)   SpO2 96%   BMI 26.14 kg/m     Review of Systems  Constitutional: Negative for appetite change, chills, fatigue and fever.  HENT: Negative for congestion, dental problem, ear pain, hearing loss, sore throat, tinnitus, trouble swallowing and voice change.   Eyes: Negative for pain, discharge and visual disturbance.  Respiratory: Negative for cough, chest tightness, wheezing and stridor.   Cardiovascular: Negative for chest pain, palpitations and leg swelling.  Gastrointestinal: Negative for abdominal distention, abdominal pain, blood in stool, constipation, diarrhea, nausea and vomiting.  Genitourinary: Negative for difficulty urinating, discharge, flank pain, genital sores, hematuria and urgency.  Musculoskeletal: Positive for  arthralgias. Negative for back pain, gait problem, joint swelling, myalgias and neck stiffness.  Skin: Negative for rash.  Neurological: Negative for dizziness, syncope, speech difficulty, weakness, numbness and headaches.  Hematological: Negative for adenopathy. Does not bruise/bleed easily.  Psychiatric/Behavioral: Negative for behavioral problems and dysphoric mood. The patient is not nervous/anxious.        Objective:   Physical Exam  Constitutional: He is oriented to person, place, and time. He appears well-developed.  Blood pressure low normal   HENT:  Head: Normocephalic.  Right Ear: External ear normal.  Left Ear: External ear normal.  Eyes: Conjunctivae and EOM are normal.  Neck: Normal range of motion.  Cardiovascular: Normal rate and normal heart sounds.   Pulmonary/Chest: Breath sounds normal.  Abdominal: Bowel sounds are normal.  Musculoskeletal: Normal range of motion. He exhibits no edema or tenderness.  Neurological: He is alert and oriented to person, place, and time.  Skin:  Patient has a small papule in the right temporal area with a tiny a crusted area consistent with a small BCE  Psychiatric: He has a normal mood and affect. His behavior is normal.          Assessment & Plan:   Diabetes.  Appears to be very well controlled.  Check hemoglobin A1c.  Follow-up 6 months Coronary artery disease.  Follow-up cardiology RA.  Follow-up rheumatology Probable small BCE right temporal scalp.  Will set up for dermatology evaluation   [name]

## 2017-02-02 ENCOUNTER — Ambulatory Visit: Payer: BLUE CROSS/BLUE SHIELD | Admitting: Internal Medicine

## 2017-04-26 DIAGNOSIS — M25511 Pain in right shoulder: Secondary | ICD-10-CM | POA: Diagnosis not present

## 2017-04-26 DIAGNOSIS — M150 Primary generalized (osteo)arthritis: Secondary | ICD-10-CM | POA: Diagnosis not present

## 2017-04-26 DIAGNOSIS — E663 Overweight: Secondary | ICD-10-CM | POA: Diagnosis not present

## 2017-04-26 DIAGNOSIS — Z6826 Body mass index (BMI) 26.0-26.9, adult: Secondary | ICD-10-CM | POA: Diagnosis not present

## 2017-04-26 DIAGNOSIS — M0589 Other rheumatoid arthritis with rheumatoid factor of multiple sites: Secondary | ICD-10-CM | POA: Diagnosis not present

## 2017-04-28 ENCOUNTER — Other Ambulatory Visit: Payer: Self-pay | Admitting: Internal Medicine

## 2017-04-28 DIAGNOSIS — E1151 Type 2 diabetes mellitus with diabetic peripheral angiopathy without gangrene: Secondary | ICD-10-CM

## 2017-05-03 ENCOUNTER — Other Ambulatory Visit: Payer: Self-pay | Admitting: Internal Medicine

## 2017-05-03 MED ORDER — ATORVASTATIN CALCIUM 80 MG PO TABS: ORAL_TABLET | ORAL | 1 refills | Status: DC

## 2017-05-05 DIAGNOSIS — L814 Other melanin hyperpigmentation: Secondary | ICD-10-CM | POA: Diagnosis not present

## 2017-05-05 DIAGNOSIS — D1801 Hemangioma of skin and subcutaneous tissue: Secondary | ICD-10-CM | POA: Diagnosis not present

## 2017-05-05 DIAGNOSIS — Z85828 Personal history of other malignant neoplasm of skin: Secondary | ICD-10-CM | POA: Diagnosis not present

## 2017-05-05 DIAGNOSIS — L570 Actinic keratosis: Secondary | ICD-10-CM | POA: Diagnosis not present

## 2017-05-05 DIAGNOSIS — L821 Other seborrheic keratosis: Secondary | ICD-10-CM | POA: Diagnosis not present

## 2017-05-11 ENCOUNTER — Encounter: Payer: Self-pay | Admitting: Cardiovascular Disease

## 2017-05-13 ENCOUNTER — Other Ambulatory Visit: Payer: Self-pay | Admitting: Physician Assistant

## 2017-05-13 ENCOUNTER — Other Ambulatory Visit: Payer: Self-pay | Admitting: Internal Medicine

## 2017-05-13 NOTE — Telephone Encounter (Signed)
Refill Request.  

## 2017-06-01 ENCOUNTER — Ambulatory Visit (INDEPENDENT_AMBULATORY_CARE_PROVIDER_SITE_OTHER): Payer: 59 | Admitting: Cardiovascular Disease

## 2017-06-01 ENCOUNTER — Encounter: Payer: Self-pay | Admitting: Cardiovascular Disease

## 2017-06-01 VITALS — BP 110/60 | HR 72 | Ht 70.0 in | Wt 179.8 lb

## 2017-06-01 DIAGNOSIS — E785 Hyperlipidemia, unspecified: Secondary | ICD-10-CM

## 2017-06-01 DIAGNOSIS — I10 Essential (primary) hypertension: Secondary | ICD-10-CM

## 2017-06-01 DIAGNOSIS — I2510 Atherosclerotic heart disease of native coronary artery without angina pectoris: Secondary | ICD-10-CM | POA: Diagnosis not present

## 2017-06-01 DIAGNOSIS — I255 Ischemic cardiomyopathy: Secondary | ICD-10-CM

## 2017-06-01 DIAGNOSIS — I25810 Atherosclerosis of coronary artery bypass graft(s) without angina pectoris: Secondary | ICD-10-CM | POA: Diagnosis not present

## 2017-06-01 MED ORDER — ATORVASTATIN CALCIUM 80 MG PO TABS: ORAL_TABLET | ORAL | 3 refills | Status: DC

## 2017-06-01 NOTE — Patient Instructions (Signed)
Medication Instructions:  Your physician recommends that you continue on your current medications as directed. Please refer to the Current Medication list given to you today.  Labwork: No new orders.  Please have a LIPID panel checked with your next blood draw.  Testing/Procedures: No new orders.   Follow-Up: Your physician wants you to follow-up in: 1 YEAR with Dr Burt Knack. You will receive a reminder letter in the mail two months in advance. If you don't receive a letter, please call our office to schedule the follow-up appointment.   Any Other Special Instructions Will Be Listed Below (If Applicable).     If you need a refill on your cardiac medications before your next appointment, please call your pharmacy.

## 2017-06-01 NOTE — Progress Notes (Signed)
Cardiology Office Note Date:  06/01/2017   ID:  [name], [name] [birth year], MRN [id number]  PCP:  Marletta Lor, MD  Cardiologist:  Sherren Mocha, MD    Chief Complaint  Patient presents with  . Follow-up    6 month     History of Present Illness: [name] is a 71 y.o. male who presents for Follow-up of coronary artery disease. The patient initially presented in 2016 with an acute anterior MI complicated by out of hospital cardiac arrest. He initially had severe LV systolic dysfunction but his LV function improved after revascularization.  The patient is here alone today. He's doing really well. Walking for exercise on a regular basis and he denies exertional symptoms. His arthritis has been better controlled since he's retired from [employer]. Today, he denies symptoms of palpitations, chest pain, shortness of breath, orthopnea, PND, lower extremity edema, dizziness, or syncope.  Past Medical History:  Diagnosis Date  . Allergic rhinitis   . CAD (coronary artery disease)    a. 11/11/15 - OOH VF arrest 2/2 ant STEMI >> CPR//AED shock x 2 >> ROSC >> LHC: pLAD 100 (Promus DES), mLCx 50, mRCA 30, EF 25% [managed with hypothermia protocol]  . Fractured rib   . History of Clostridium difficile colitis 10/2015   post MI >> required IV Flagyl and IV Vanc >> DC on po Vanc  . HLD (hyperlipidemia)   . Hypertension   . Impaired glucose tolerance   . Insomnia   . Ischemic cardiomyopathy    a. EF at [facility] 11/11/15 25%;  b. Echo 11/14/15: EF 50-55%, dist ant and anterior septal HK  . Nephrolithiasis 1968  . Pelvic fracture ([place]) 09/2015  . Rheumatoid arthritis(714.0) 1998  . ST elevation (STEMI) myocardial infarction involving left anterior descending coronary artery ([place]) 11/11/15   100% LAD, Cardiogenic Shock. -- EF ~20-25%    Past Surgical History:  Procedure Laterality Date  . CARDIAC CATHETERIZATION N/A 11/11/2015   Procedure: Left Heart Cath and Cors/Grafts  Angiography;  Surgeon: Sherren Mocha, MD;  Location: [place] [facility];  Service: Cardiovascular;  Laterality: N/A;  . CARDIAC CATHETERIZATION N/A 11/11/2015   Procedure: Coronary Stent Intervention;  Surgeon: Sherren Mocha, MD;  Location: [place] [facility];  Service: Cardiovascular;  Laterality: N/A;  prox lad 3.5x38 promus  . status post multiple lithotripy and urological surgery for reccurent calcium oxalate stones      Current Outpatient Prescriptions  Medication Sig Dispense Refill  . aspirin 81 MG chewable tablet Chew 1 tablet (81 mg total) by mouth daily.    Marland Kitchen atorvastatin (LIPITOR) 80 MG tablet Take 1 tablet by mouth  daily at 6 P.M. 90 tablet 3  . cetirizine (ZYRTEC) 10 MG tablet Take 10 mg by mouth daily.    . Cholecalciferol (VITAMIN D-3) 1000 UNITS CAPS Take 5,000 Units by mouth daily.     . famotidine (PEPCID) 20 MG tablet Take 1 tablet (20 mg total) by mouth 2 (two) times daily. 180 tablet 0  . fluticasone (FLONASE) 50 MCG/ACT nasal spray USE 2 SPRAYS IN EACH  NOSTRIL DAILY 48 g 3  . folic acid (FOLVITE) 1 MG tablet Take 1 mg by mouth daily.      Marland Kitchen HYDROcodone-acetaminophen (NORCO/VICODIN) 5-325 MG tablet Take 1-2 tablets by mouth every 6 (six) hours as needed for moderate pain. 60 tablet 0  . meloxicam (MOBIC) 7.5 MG tablet Take 7.5 mg by mouth 2 (two) times daily.     Marland Kitchen  metFORMIN (GLUCOPHAGE) 500 MG tablet Take 500 mg by mouth 2 (two) times daily with a meal.    . methotrexate (RHEUMATREX) 2.5 MG tablet Take 15 mg by mouth once a week. Caution:Chemotherapy. Protect from light.    . metoprolol tartrate (LOPRESSOR) 25 MG tablet TAKE ONE-HALF TABLET BY  MOUTH TWO TIMES DAILY 90 tablet 3  . nitroGLYCERIN (NITROSTAT) 0.4 MG SL tablet Place 1 tablet under the tongue as needed. Every 5 minutes for chest pain  3  . Probiotic Product (PRO-BIOTIC BLEND PO) Take 1 tablet by mouth daily.    . tamsulosin (FLOMAX) 0.4 MG CAPS capsule Take 1 capsule by mouth  daily 90 capsule 4  .  traMADol (ULTRAM) 50 MG tablet Take 50 mg by mouth every 6 (six) hours as needed for moderate pain.    . traZODone (DESYREL) 50 MG tablet TAKE 1 TABLET BY MOUTH AT  BEDTIME 90 tablet 2   No current facility-administered medications for this visit.     Allergies:   Penicillins; Contrast media [iodinated diagnostic agents]; Sulfamethoxazole; and Ibuprofen   Social History:  The patient  reports that he has quit smoking. He has never used smokeless tobacco. He reports that he does not drink alcohol or use drugs.   Family History:  The patient's family history includes Cancer in his mother; Diabetes in his father.   ROS:  Please see the history of present illness.  All other systems are reviewed and negative.   PHYSICAL EXAM: VS:  BP 110/60   Pulse 72   Ht 5\' 10"  (1.778 m)   Wt 179 lb 12.8 oz (81.6 kg)   BMI 25.80 kg/m  , BMI Body mass index is 25.8 kg/m. GEN: Well nourished, well developed, in no acute distress  HEENT: normal  Neck: no JVD, no masses. No carotid bruits Cardiac: RRR without murmur or gallop                Respiratory:  clear to auscultation bilaterally, normal work of breathing GI: soft, nontender, nondistended, + BS MS: no deformity or atrophy  Ext: no pretibial edema, pedal pulses 2+= bilaterally Skin: warm and dry, no rash Neuro:  Strength and sensation are intact Psych: euthymic mood, full affect  EKG:  EKG is ordered today. The ekg ordered today shows NSR 72 bpm, nonspecific T wave abnormality  Recent Labs: No results found for requested labs within last 8760 hours.   Lipid Panel     Component Value Date/Time   CHOL 111 (L) 01/29/2016 0757   TRIG 124 01/29/2016 0757   HDL 27 (L) 01/29/2016 0757   CHOLHDL 4.1 01/29/2016 0757   VLDL 25 01/29/2016 0757   LDLCALC 59 01/29/2016 0757      Wt Readings from Last 3 Encounters:  06/01/17 179 lb 12.8 oz (81.6 kg)  01/27/17 182 lb 3.2 oz (82.6 kg)  11/15/16 178 lb (80.7 kg)     Cardiac Studies  Reviewed: 2D Echo 11/14/2015: Left ventricle:  The cavity size was normal. Systolic function was normal. The estimated ejection fraction was in the range of 50% to 55%. There may be hypokinesis of the distal anterior wall and anterior septum. The study is not technically sufficient to allow evaluation of LV diastolic function.  ------------------------------------------------------------------- Aortic valve:  Poorly visualized.  Normal thickness leaflets. Mobility was not restricted.  Doppler:  Transvalvular velocity was within the normal range. There was no stenosis. There was no regurgitation.  ------------------------------------------------------------------- Aorta:  Aortic root: The aortic root  was normal in size.  ------------------------------------------------------------------- Mitral valve:   Structurally normal valve.   Mobility was not restricted.  Doppler:  Transvalvular velocity was within the normal range. There was no evidence for stenosis. There was no regurgitation.  ------------------------------------------------------------------- Left atrium:  The atrium was normal in size.  ------------------------------------------------------------------- Right ventricle:  The cavity size was normal. Wall thickness was normal. Systolic function was normal.  ------------------------------------------------------------------- Pulmonic valve:   Poorly visualized.  Doppler:  Transvalvular velocity was within the normal range. There was no evidence for stenosis.  ------------------------------------------------------------------- Tricuspid valve:   Structurally normal valve.   Leaflet separation was normal.  Doppler:  Transvalvular velocity was within the normal range. There was no regurgitation.  ------------------------------------------------------------------- Pulmonary artery:   Poorly visualized. The main pulmonary artery was normal-sized. Systolic pressure  was within the normal range.   ------------------------------------------------------------------- Right atrium:  The atrium was normal in size.  ------------------------------------------------------------------- Pericardium:  There was no pericardial effusion.  Cardiac Cath 11/11/2015: Conclusion   1. Total occlusion of the proximal LAD, treated successfully with primary PCI (drug-eluting stent) 2. Nonobstructive LCx and RCA stenoses 3. Severe segmental LV systolic dysfunction, LVEF 25%  Recommendations: hypothermia protocol per CCM team, post-MI medical therapy and supportive care in CCU setting.  Indications   ST elevation myocardial infarction involving left anterior descending (LAD) coronary artery ([name]) [I21.02 (JOA-41-YS)]  Complications   Complications documented in old activity   INDICATION: Anterior STEMI with out-of-hospital VF arrest   PROCEDURAL DETAILS: The right wrist was prepped, draped, and anesthetized with 1% lidocaine. Using the modified Seldinger technique, a 5/6 French Slender sheath was introduced into the right radial artery. 3 mg of verapamil was administered through the sheath, weight-based unfractionated heparin was administered intravenously. Standard Judkins catheters were used for selective coronary angiography and left ventriculography. Catheter exchanges were performed over an exchange length guidewire. PCI is performed - see PCI note below. There were no immediate procedural complications. A TR band was used for radial hemostasis at the completion of the procedure. The patient was transferred to the post catheterization recovery area for further monitoring.   Estimated blood loss <50 mL. There were no immediate complications during the procedure.      Coronary Findings   Dominance: Right  Left Anterior Descending  Prox LAD lesion, 100% stenosed. Thrombotic.  PCI: There is no pre-interventional antegrade distal flow (TIMI 0). Pre-stent  angioplasty was performed. A drug-eluting stent was placed. Post-stent angioplasty was performed. The post-interventional distal flow is normal (TIMI 3). The intervention was successful. No complications occurred at this lesion. The proximal LAD is 100% occluded with TIMI-0 flow. Heparin and aggrastat are used for systemic anticoagulation. Brilinta 180 mg is administered via the OG tube. An XB-LAD guide is used and a Buyer, retail is used to cross the occlusion. The LAD is treated with a 2.5x12 mm balloon on multiple inflations. Angiography shows severe diffuse proximal LAD stenosis post-angioplasty. The vessel is stented with a 3.5x38 mm Promus DES and post-dilated with a 3.75 mm  balloon.  There is no residual stenosis post intervention.  Left Circumflex  Mid Cx lesion, 50% stenosed.  Right Coronary Artery  Mid RCA lesion, 30% stenosed.  Wall Motion              Left Heart   Left Ventricle The left ventricular size is normal. There is severe left ventricular systolic dysfunction. The left ventricular ejection fraction is 25-35% by visual estimate. There are wall motion abnormalities in the left ventricle.  There are segmental wall motion abnormalities in the left ventricle. The anterolateral and periapical LV walls are akinetic with an estimated LVEF of 25%    Coronary Diagrams   Diagnostic Diagram       Post-Intervention Diagram          ASSESSMENT AND PLAN: 1.  Coronary artery disease, native vessel: No symptoms of angina. Medications are reviewed and will be continued without changes. The patient is doing well with a good functional capacity and no cardiac symptoms at this time.  2. Hyperlipidemia: The patient is treated with atorvastatin 80 mg daily. LFTs were recently checked in May and his labs are reviewed today. AST is 25 and ALT is 37. He will ask for lipids to be drawn by Dr. Amil Amen when he follows up with him in the near future.  3. Essential hypertension: BP is  well-controlled on current Rx. Recent labs reviewed with a creatinine of 0.95 and potassium of 4.7.  Current medicines are reviewed with the patient today.  The patient does not have concerns regarding medicines.  Labs/ tests ordered today include:   Orders Placed This Encounter  Procedures  . EKG 12-Lead   Disposition:   FU one year  Signed, Sherren Mocha, MD  06/01/2017 5:54 PM    [address], [place], [place]  [address] Phone: [phone]; Fax: [phone]

## 2017-07-15 ENCOUNTER — Other Ambulatory Visit: Payer: Self-pay | Admitting: Internal Medicine

## 2017-07-27 ENCOUNTER — Encounter: Payer: Self-pay | Admitting: Internal Medicine

## 2017-07-27 ENCOUNTER — Ambulatory Visit (INDEPENDENT_AMBULATORY_CARE_PROVIDER_SITE_OTHER): Payer: 59 | Admitting: Internal Medicine

## 2017-07-27 VITALS — BP 116/62 | HR 74 | Temp 98.6°F | Ht 70.0 in | Wt 178.8 lb

## 2017-07-27 DIAGNOSIS — E7800 Pure hypercholesterolemia, unspecified: Secondary | ICD-10-CM

## 2017-07-27 DIAGNOSIS — I255 Ischemic cardiomyopathy: Secondary | ICD-10-CM

## 2017-07-27 DIAGNOSIS — E1151 Type 2 diabetes mellitus with diabetic peripheral angiopathy without gangrene: Secondary | ICD-10-CM | POA: Diagnosis not present

## 2017-07-27 DIAGNOSIS — I10 Essential (primary) hypertension: Secondary | ICD-10-CM

## 2017-07-27 DIAGNOSIS — Z0000 Encounter for general adult medical examination without abnormal findings: Secondary | ICD-10-CM

## 2017-07-27 DIAGNOSIS — Z9861 Coronary angioplasty status: Secondary | ICD-10-CM

## 2017-07-27 DIAGNOSIS — I2510 Atherosclerotic heart disease of native coronary artery without angina pectoris: Secondary | ICD-10-CM | POA: Diagnosis not present

## 2017-07-27 LAB — COMPREHENSIVE METABOLIC PANEL
ALBUMIN: 4.6 g/dL (ref 3.5–5.2)
ALT: 35 U/L (ref 0–53)
AST: 19 U/L (ref 0–37)
Alkaline Phosphatase: 82 U/L (ref 39–117)
BUN: 21 mg/dL (ref 6–23)
CALCIUM: 9.5 mg/dL (ref 8.4–10.5)
CHLORIDE: 103 meq/L (ref 96–112)
CO2: 30 mEq/L (ref 19–32)
Creatinine, Ser: 0.93 mg/dL (ref 0.40–1.50)
GFR: 85.26 mL/min (ref >60.00)
Glucose, Bld: 134 mg/dL — ABNORMAL HIGH (ref 70–99)
POTASSIUM: 4.2 meq/L (ref 3.5–5.1)
SODIUM: 139 meq/L (ref 135–145)
Total Bilirubin: 0.9 mg/dL (ref 0.2–1.2)
Total Protein: 6.1 g/dL (ref 6.0–8.3)

## 2017-07-27 LAB — TSH: TSH: 1.97 u[IU]/mL (ref 0.35–4.50)

## 2017-07-27 LAB — LIPID PANEL
CHOL/HDL RATIO: 3
CHOLESTEROL: 88 mg/dL (ref 0–200)
HDL: 32.8 mg/dL — ABNORMAL LOW (ref >39.00)
LDL CALC: 36 mg/dL (ref 0–99)
NonHDL: 55.61
Triglycerides: 97 mg/dL (ref 0.0–149.0)
VLDL: 19.4 mg/dL (ref 0.0–40.0)

## 2017-07-27 LAB — CBC WITH DIFFERENTIAL/PLATELET
BASOS ABS: 0 10*3/uL (ref 0.0–0.1)
Basophils Relative: 0.3 % (ref 0.0–3.0)
EOS PCT: 0.8 % (ref 0.0–5.0)
Eosinophils Absolute: 0.1 10*3/uL (ref 0.0–0.7)
HCT: 47 % (ref 39.0–52.0)
HEMOGLOBIN: 15.9 g/dL (ref 13.0–17.0)
LYMPHS PCT: 11.6 % — AB (ref 12.0–46.0)
Lymphs Abs: 0.8 10*3/uL (ref 0.7–4.0)
MCHC: 33.9 g/dL (ref 30.0–36.0)
MCV: 95.5 fl (ref 78.0–100.0)
MONOS PCT: 8 % (ref 3.0–12.0)
Monocytes Absolute: 0.6 10*3/uL (ref 0.1–1.0)
Neutro Abs: 5.7 10*3/uL (ref 1.4–7.7)
Neutrophils Relative %: 79.3 % — ABNORMAL HIGH (ref 43.0–77.0)
Platelets: 170 10*3/uL (ref 150.0–400.0)
RBC: 4.92 Mil/uL (ref 4.22–5.81)
RDW: 14.2 % (ref 11.5–15.5)
WBC: 7.2 10*3/uL (ref 4.0–10.5)

## 2017-07-27 LAB — MICROALBUMIN / CREATININE URINE RATIO
Creatinine,U: 216.5 mg/dL
MICROALB/CREAT RATIO: 1.7 mg/g (ref 0.0–30.0)
Microalb, Ur: 3.6 mg/dL — ABNORMAL HIGH (ref 0.0–1.9)

## 2017-07-27 LAB — HEMOGLOBIN A1C: HEMOGLOBIN A1C: 6.6 % — AB (ref 4.6–6.5)

## 2017-07-27 NOTE — Patient Instructions (Signed)
Limit your sodium (Salt) intake    It is important that you exercise regularly, at least 20 minutes 3 to 4 times per week.  If you develop chest pain or shortness of breath seek  medical attention.   Please check your hemoglobin A1c every 3-6  Months  Cardiology follow-up as scheduled

## 2017-07-27 NOTE — Progress Notes (Signed)
Subjective:    Patient ID: [name], male    DOB: 05-Oct-1946, 71 y.o.   MRN: [id number]  HPI 71 year old patient who is seen today for a preventive health examination and subsequent Medicare wellness visit He is doing quite well.  He is followed by multiple consultants Remains quite active with an exercise regimen as well as very active with activities about the house.  Even mows his lawn with a self-propelled mower.  No cardiopulmonary complaints.  Medical issues include an ischemic cardiomyopathy  Past Medical History:  Diagnosis Date  . Allergic rhinitis   . CAD (coronary artery disease)    a. 11/11/15 - OOH VF arrest 2/2 ant STEMI >> CPR//AED shock x 2 >> ROSC >> LHC: pLAD 100 (Promus DES), mLCx 50, mRCA 30, EF 25% [managed with hypothermia protocol]  . Fractured rib   . History of Clostridium difficile colitis 10/2015   post MI >> required IV Flagyl and IV Vanc >> DC on po Vanc  . HLD (hyperlipidemia)   . Hypertension   . Impaired glucose tolerance   . Insomnia   . Ischemic cardiomyopathy    a. EF at [facility] 11/11/15 25%;  b. Echo 11/14/15: EF 50-55%, dist ant and anterior septal HK  . Nephrolithiasis 1968  . Pelvic fracture ([place]) 09/2015  . Rheumatoid arthritis(714.0) 1998  . ST elevation (STEMI) myocardial infarction involving left anterior descending coronary artery ([place]) 11/11/15   100% LAD, Cardiogenic Shock. -- EF ~20-25%     Social History   Social History  . Marital status: Married    Spouse name: N/A  . Number of children: N/A  . Years of education: N/A   Occupational History  . Not on file.   Social History Main Topics  . 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 Smokeless tobacco: Never Used     Comment: quit 30 yrs ago  . Alcohol use No  . Drug use: No  . Sexual activity: Not on file   Other Topics Concern  . Not on file   Social History Narrative  . No narrative on file    Past Surgical History:  Procedure Laterality Date  . CARDIAC  CATHETERIZATION N/A 11/11/2015   Procedure: Left Heart Cath and Cors/Grafts Angiography;  Surgeon: Sherren Mocha, MD;  Location: [place] [facility];  Service: Cardiovascular;  Laterality: N/A;  . CARDIAC CATHETERIZATION N/A 11/11/2015   Procedure: Coronary Stent Intervention;  Surgeon: Sherren Mocha, MD;  Location: [place] [facility];  Service: Cardiovascular;  Laterality: N/A;  prox lad 3.5x38 promus  . status post multiple lithotripy and urological surgery for reccurent calcium oxalate stones      Family History  Problem Relation Age of Onset  . Cancer Mother        pancreatic ; pulmonary embolism   . Diabetes Father     Allergies  Allergen Reactions  . Penicillins Anaphylaxis  . Contrast Media [Iodinated Diagnostic Agents] Hives    03/28/14 pt received 1 hour emergent prep and pt did good and had no complaints after scan when given IV benadryl first. Had reaction 1 time, tolerated fine in 03/2014 with Benadryl  . Sulfamethoxazole Hives    Childhood reaction  . Ibuprofen Swelling    Happened 1 time    Current Outpatient Prescriptions on File Prior to Visit  Medication Sig Dispense Refill  . aspirin 81 MG chewable tablet Chew 1 tablet (81 mg total) by mouth daily.    Marland Kitchen atorvastatin (LIPITOR) 80 MG tablet  Take 1 tablet by mouth  daily at 6 P.M. 90 tablet 3  . cetirizine (ZYRTEC) 10 MG tablet Take 10 mg by mouth daily.    . Cholecalciferol (VITAMIN D-3) 1000 UNITS CAPS Take 5,000 Units by mouth daily.     . famotidine (PEPCID) 20 MG tablet Take 1 tablet (20 mg total) by mouth 2 (two) times daily. 180 tablet 0  . fluticasone (FLONASE) 50 MCG/ACT nasal spray USE 2 SPRAYS IN EACH  NOSTRIL DAILY 48 g 3  . folic acid (FOLVITE) 1 MG tablet Take 1 mg by mouth daily.      Marland Kitchen HYDROcodone-acetaminophen (NORCO/VICODIN) 5-325 MG tablet Take 1-2 tablets by mouth every 6 (six) hours as needed for moderate pain. 60 tablet 0  . meloxicam (MOBIC) 7.5 MG tablet Take 7.5 mg by mouth 2 (two) times  daily.     . metFORMIN (GLUCOPHAGE) 500 MG tablet Take 500 mg by mouth 2 (two) times daily with a meal.    . methotrexate (RHEUMATREX) 2.5 MG tablet Take 15 mg by mouth once a week. Caution:Chemotherapy. Protect from light.    . metoprolol tartrate (LOPRESSOR) 25 MG tablet TAKE ONE-HALF TABLET BY  MOUTH TWO TIMES DAILY 90 tablet 3  . nitroGLYCERIN (NITROSTAT) 0.4 MG SL tablet Place 1 tablet under the tongue as needed. Every 5 minutes for chest pain  3  . Probiotic Product (PRO-BIOTIC BLEND PO) Take 1 tablet by mouth daily.    . tamsulosin (FLOMAX) 0.4 MG CAPS capsule TAKE 1 CAPSULE BY MOUTH  DAILY 90 capsule 2  . traMADol (ULTRAM) 50 MG tablet Take 50 mg by mouth every 6 (six) hours as needed for moderate pain.    . traZODone (DESYREL) 50 MG tablet TAKE 1 TABLET BY MOUTH AT  BEDTIME 90 tablet 2   No current facility-administered medications on file prior to visit.     BP 116/62 (BP Location: Left Arm, Patient Position: Sitting, Cuff Size: Normal)   Pulse 74   Temp 98.6 F (37 C) (Oral)   Ht 5\' 10"  (1.778 m)   Wt 178 lb 12.8 oz (81.1 kg)   SpO2 97%   BMI 25.66 kg/m      Review of Systems  Constitutional: Negative for appetite change, chills, fatigue and fever.  HENT: Negative for congestion, dental problem, ear pain, hearing loss, sore throat, tinnitus, trouble swallowing and voice change.   Eyes: Negative for pain, discharge and visual disturbance.  Respiratory: Negative for cough, chest tightness, wheezing and stridor.   Cardiovascular: Negative for chest pain, palpitations and leg swelling.  Gastrointestinal: Negative for abdominal distention, abdominal pain, blood in stool, constipation, diarrhea, nausea and vomiting.  Genitourinary: Negative for difficulty urinating, discharge, flank pain, genital sores, hematuria and urgency.  Musculoskeletal: Negative for arthralgias, back pain, gait problem, joint swelling, myalgias and neck stiffness.  Skin: Negative for rash.    Neurological: Negative for dizziness, syncope, speech difficulty, weakness, numbness and headaches.  Hematological: Negative for adenopathy. Does not bruise/bleed easily.  Psychiatric/Behavioral: Negative for behavioral problems and dysphoric mood. The patient is not nervous/anxious.    Medicare wellness visit  1. Risk factors, based on past  M,S,F history.  Patient has known coronary artery disease status post cardiac arrest with ischemic cardiomyopathy  2.  Physical activities:remains quite active with a regular exercise program.  He walks and does resistance training 3-5 times per week.  He is very active with household duties including lawn mowing  3.  Depression/mood:no history major depression or mood  disorder  4.  Hearing:no deficits  5.  ADL's:independent  6.  Fall risk:low  7.  Home safety:no problems identified  8.  Height weight, and visual acuity;height and weight stable no change in visual acuity  9.  Counseling:continue heart healthy diet and active lifestyle  10. Lab orders based on risk factors:laboratory profile will be reviewed including hemoglobin A1c and lipid profile  11. Referral :follow-up multiple consultants  12. Care plan:continue efforts at aggressive risk factor modification  13. Cognitive assessment: alert and oriented with normal affect no cognitive dysfunction  14. Screening: Patient provided with a written and personalized 5-10 year screening schedule in the AVS.    15. Provider List Update: includes cardiology, rheumatology, dermatology, ophthalmology and urology      Objective:   Physical Exam  Constitutional: He appears well-developed and well-nourished.  HENT:  Head: Normocephalic and atraumatic.  Right Ear: External ear normal.  Left Ear: External ear normal.  Nose: Nose normal.  Mouth/Throat: Oropharynx is clear and moist.  Eyes: Pupils are equal, round, and reactive to light. Conjunctivae and EOM are normal. No scleral icterus.   Neck: Normal range of motion. Neck supple. No JVD present. No thyromegaly present.  Cardiovascular: Regular rhythm, normal heart sounds and intact distal pulses.  Exam reveals no gallop and no friction rub.   No murmur heard. Pulmonary/Chest: Effort normal and breath sounds normal. He exhibits no tenderness.  Abdominal: Soft. Bowel sounds are normal. He exhibits no distension and no mass. There is no tenderness.  Genitourinary: Prostate normal and penis normal.  Musculoskeletal: Normal range of motion. He exhibits no edema or tenderness.  Lymphadenopathy:    He has no cervical adenopathy.  Neurological: He is alert. He has normal reflexes. No cranial nerve deficit. Coordination normal.  Skin: Skin is warm and dry. No rash noted.  Psychiatric: He has a normal mood and affect. His behavior is normal.          Assessment & Plan:   Preventive health examination.  Will discuss colonoscopy versus Cologuard Subsequent Medicare wellness visit Essential hypertension, stable Coronary artery disease/ischemic cardio myopathy/ status post VF arrest Dyslipidemia.  Continue statin therapy Diabetes mellitus type 2.  Will review a hemoglobin A1c, urine for microalbumin  Follow-up 6 months Follow-up multiple consultants  [name]

## 2017-07-29 DIAGNOSIS — E663 Overweight: Secondary | ICD-10-CM | POA: Diagnosis not present

## 2017-07-29 DIAGNOSIS — M25511 Pain in right shoulder: Secondary | ICD-10-CM | POA: Diagnosis not present

## 2017-07-29 DIAGNOSIS — M150 Primary generalized (osteo)arthritis: Secondary | ICD-10-CM | POA: Diagnosis not present

## 2017-07-29 DIAGNOSIS — Z6826 Body mass index (BMI) 26.0-26.9, adult: Secondary | ICD-10-CM | POA: Diagnosis not present

## 2017-07-29 DIAGNOSIS — M0589 Other rheumatoid arthritis with rheumatoid factor of multiple sites: Secondary | ICD-10-CM | POA: Diagnosis not present

## 2017-08-04 DIAGNOSIS — Z1212 Encounter for screening for malignant neoplasm of rectum: Secondary | ICD-10-CM | POA: Diagnosis not present

## 2017-08-04 DIAGNOSIS — Z1211 Encounter for screening for malignant neoplasm of colon: Secondary | ICD-10-CM | POA: Diagnosis not present

## 2017-08-10 DIAGNOSIS — N200 Calculus of kidney: Secondary | ICD-10-CM | POA: Diagnosis not present

## 2017-08-10 DIAGNOSIS — R3912 Poor urinary stream: Secondary | ICD-10-CM | POA: Diagnosis not present

## 2017-08-10 DIAGNOSIS — N401 Enlarged prostate with lower urinary tract symptoms: Secondary | ICD-10-CM | POA: Diagnosis not present

## 2017-08-11 LAB — COLOGUARD: Cologuard: NEGATIVE

## 2017-08-17 ENCOUNTER — Encounter: Payer: Self-pay | Admitting: Internal Medicine

## 2017-08-19 DIAGNOSIS — N201 Calculus of ureter: Secondary | ICD-10-CM | POA: Diagnosis not present

## 2017-08-19 DIAGNOSIS — R1084 Generalized abdominal pain: Secondary | ICD-10-CM | POA: Diagnosis not present

## 2017-08-19 DIAGNOSIS — R310 Gross hematuria: Secondary | ICD-10-CM | POA: Diagnosis not present

## 2017-08-19 DIAGNOSIS — R109 Unspecified abdominal pain: Secondary | ICD-10-CM | POA: Diagnosis not present

## 2017-08-23 ENCOUNTER — Encounter: Payer: Self-pay | Admitting: Cardiovascular Disease

## 2017-08-23 ENCOUNTER — Other Ambulatory Visit: Payer: Self-pay | Admitting: Urology

## 2017-08-23 ENCOUNTER — Encounter: Payer: Self-pay | Admitting: Internal Medicine

## 2017-08-24 ENCOUNTER — Encounter (HOSPITAL_COMMUNITY): Payer: Self-pay | Admitting: *Deleted

## 2017-08-24 ENCOUNTER — Telehealth: Payer: Self-pay | Admitting: Cardiovascular Disease

## 2017-08-24 NOTE — Telephone Encounter (Signed)
Chart reviewed. Ok to hold ASA 3 days as requested. thanks

## 2017-08-24 NOTE — Telephone Encounter (Signed)
° °  [facility] Pre-operative Risk Assessment    Request for surgical clearance:  1. What type of surgery is being performed? Laser Lithotripsy   2. When is this surgery scheduled? 08-29-17   3. Are there any medications that need to be held prior to surgery and how long?Can pt stop his Aspirin 72 hrs prior to surgery?  4. Name of physician performing surgery? Dr Raynelle Bring  5. What is your office phone and fax number? [phone] and fax is [phone]   [name] 08/24/2017, 8:46 AM  _________________________________________________________________   (provider comments below)

## 2017-08-24 NOTE — Telephone Encounter (Signed)
Faxed to Dr. Alinda Money at [phone]

## 2017-08-26 NOTE — H&P (Signed)
@media  Print    Office Visit Report 08/19/2017    [name]         MRN: [id number]  PRIMARY CARE:  Bluford Kaufmann, MD  DOB: Sep 20, 1946, 71 year old Male  REFERRING:    SSN: [id number]  PROVIDER:  Festus Aloe, M.D.    TREATING:  Jimmey Ralph    LOCATION:  [facility] [phone]    CC: I have blood in my urine.  HPI: [name] is a 71 year-old male established patient who is here for blood in the urine.  Has passed several very small stone fragments   He did see the blood in his urine. He first noticed the symptoms approximately 08/18/2017. He has not seen blood clots.   He does have a burning sensation when he urinates. He is not currently having trouble urinating.   He is not having pain. He has not recently had unwanted weight loss.     CC: I have pain in the flank.  HPI: The problem is on the left side. His pain started about approximately 08/18/2017. The pain is dull. The pain is constant. The pain does not radiate.   None< makes the pain better. Nothing causes the pain to become worse.     ALLERGIES: Advil TABS - Hives Penicillins - Trouble Breathing Sulfa Drugs - Other Reaction, UNK- childhood   MEDICATIONS: Metformin Hcl 500 mg tablet  Metoprolol Succinate 25 mg tablet, extended release 24 hr  Zyrtec 10 mg tablet  Aspirin Ec 81 mg tablet, delayed release  Atorvastatin Calcium 80 mg tablet  Famotidine 20 mg tablet  Flonase 50 mcg/actuation spray, suspension Nasal  Hydrocodone-Acetaminophen 5 mg-325 mg tablet  Meloxicam 7.5 mg tablet Oral  Methotrexate 2.5 mg tablet Oral  Tamsulosin Hcl 0.4 mg capsule, ext release 24 hr 0 Oral  Ultram 50 mg tablet Oral  Vitamin D3 TABS Oral    GU PSH: Cysto Uretero Lithotripsy - 2009 Cystoscopy Insert Stent - 2009 ESWL - 2008 Ureteroscopic stone removal - 2008    NON-GU PSH: Cardiac Stent Placement - about 09/12/2016        GU PMH: BPH w/LUTS - 08/10/2017, Benign prostatic  hyperplasia with urinary obstruction, - 2016 Renal calculus - 08/10/2017 Weak Urinary Stream - 08/10/2017 History of urolithiasis, History of renal calculi - 2016, Nephrolithiasis, - 2014, Nephrolithiasis, - 2014 Nocturia, Nocturia - 2016 Chronic Kidney Disease, Chronic kidney disease - 2015 Scrotal edema, Spermatic cord cyst - 2015 BPH w/o LUTS, Benign prostatic hypertrophy without lower urinary tract symptoms - 2014 Hydronephrosis Unspec, Hydronephrosis - 2014 Ureteral calculus, Ureteral Stone - 2014      PMH Notes:  2007-09-19 09:57:51 - Note: Arthritis   NON-GU PMH: Encounter for general adult medical examination without abnormal findings, Encounter for preventive health examination - 2015 Personal history of other endocrine, nutritional and metabolic disease, History of hypercholesterolemia - 2014 Arthritis Heart disease, unspecified Myocardial Infarction    FAMILY HISTORY: Death In The Family Father - Runs In Family Death In The Family Mother - Runs In Family Diabetes - Father Family Health Status Number - Runs In Family Heart Disease - Runs In Family   SOCIAL HISTORY: Marital Status: Married Preferred Language: English; Ethnicity: Not Hispanic Or Latino; Race: White Current Smoking Status: Patient does not smoke anymore.  <DIV'  Tobacco Use Assessment Completed:  Used Tobacco in last 30 days?   Does not use smokeless tobacco. Has never drank.  Does not use drugs. Drinks 1  caffeinated drink per day.    REVIEW OF SYSTEMS:     GU Review Male:  Patient reports frequent urination and get up at night to urinate. Patient denies hard to postpone urination, burning/ pain with urination, leakage of urine, stream starts and stops, trouble starting your stream, have to strain to urinate , erection problems, and penile pain.    Gastrointestinal (Upper):  Patient denies nausea, vomiting, and indigestion/ heartburn.    Gastrointestinal (Lower):  Patient denies diarrhea and constipation.     Constitutional:  Patient denies fever, night sweats, weight loss, and fatigue.    Skin:  Patient denies skin rash/ lesion and itching.    Eyes:  Patient denies blurred vision and double vision.    Ears/ Nose/ Throat:  Patient reports sinus problems. Patient denies sore throat.    Hematologic/Lymphatic:  Patient denies easy bruising and swollen glands.    Cardiovascular:  Patient denies leg swelling and chest pains.    Respiratory:  Patient denies cough and shortness of breath.    Endocrine:  Patient denies excessive thirst.    Musculoskeletal:  Patient reports back pain and joint pain.     Neurological:  Patient denies headaches and dizziness.    Psychologic:  Patient denies depression and anxiety.    VITAL SIGNS:       08/19/2017 01:02 PM     BP 137/74 mmHg     Pulse 73 /min     Temperature 98.0 F / 36.6 C     MULTI-SYSTEM PHYSICAL EXAMINATION:      Constitutional: Well-nourished. No physical deformities. Normally developed. Good grooming.      Respiratory: No labored breathing, no use of accessory muscles.      Cardiovascular: Normal temperature, normal extremity pulses, no swelling, no varicosities.      Skin: No paleness, no jaundice, no cyanosis. No lesion, no ulcer, no rash.      Neurologic / Psychiatric: Oriented to time, oriented to place, oriented to person. No depression, no anxiety, no agitation.      Gastrointestinal: No mass, no tenderness, no rigidity, non obese abdomen. No CVAT            PAST DATA REVIEWED:   Source Of History:  Patient  Records Review:  Previous Patient Records  Urine Test Review:  Urinalysis  X-Ray Review: KUB: Reviewed Films.      12/25/13 10/20/10 10/17/08 09/20/07 09/16/06  PSA  Total PSA 2.65  1.11  0.81  1.03  1.21     PROCEDURES:    C.T. Urogram - [id number]      Large left UPJ calculus noted w/o obstruction. Dilated right ureter to mid level but no distal bilateral ureteral calculus noted. Stone clearly seen on scout  film  IMPRESSION:  8 mm calculus in left renal pelvis. No evidence of hydronephrosis.   Colonic diverticulosis, without radiographic evidence of  diverticulitis.   Stable enlarged prostate.   Aortic atherosclerosis.     Urinalysis w/Scope - 81001  Dipstick Dipstick Cont'd Micro  Color: Amber Bilirubin: Neg WBC/hpf: 0 - 5/hpf  Appearance: Clear Ketones: Neg RBC/hpf: 10 - 20/hpf  Specific Gravity: 1.010 Blood: 3+ Bacteria: NS (Not Seen)  pH: 6.0 Protein: Trace Cystals: NS (Not Seen)  Glucose: Neg Urobilinogen: 0.2 Casts: NS (Not Seen)   Nitrites: Neg Trichomonas: Not Present   Leukocyte Esterase: Neg Mucous: Not Present    Epithelial Cells: NS (Not Seen)    Yeast: NS (Not Seen)    Sperm: Not Present  Urinalysis w/Scope - 81001  Dipstick Dipstick Cont'd Micro  Specimen: Voided Bilirubin: Neg WBC/hpf: 0 - 5/hpf  Color: Amber Ketones: Neg RBC/hpf: 10 - 20/hpf  Appearance: Clear Blood: 3+ Bacteria: NS (Not Seen)  Specific Gravity: 1.010 Protein: Trace Cystals: NS (Not Seen)  pH: 6.0 Urobilinogen: 0.2 Casts: NS (Not Seen)  Glucose: Neg Nitrites: Neg Epithelial Cells: NS (Not Seen)   Leukocyte Esterase: Neg Yeast: NS (Not Seen)    ASSESSMENT:     ICD-10 Details  1 GU:  Gross hematuria - R31.0 Acute - Secondary to large left UPJ calculus  2  Flank Pain - R10.84 Left, Secondary to large left UPJ calculus  3  Ureteral calculus - N20.1 Left, Acute - Large left UPJ calculus. Discussed with Dr. Junious Silk and he agrees best treatment option would be elective ESWL.    PLAN:   Medications  Stop Meds: Fish Oil CAPS Oral Start: 12/16/2011  Discontinue: 08/19/2017 - Reason: The medication cycle was completed.    Orders  X-Rays: C.T. Stone Protocol Without Contrast  X-Ray Notes: . History:  Hematuria: Yes/No  Patient to see MD after exam: Yes/No  Previous exam: CT / IVP/ US/ KUB/ None  When:  Where:  Diabetic: Yes/ No  BUN/ Creatinine:  Date of last BUN  Creatinine:  Weight in pounds:  Allergy- IV Contrast: Yes/ No  Conflicting diabetic meds: Yes/ No  Diabetic Meds:  Prior Authorization #: [id number] per [facility]  Schedule    Document  Letter(s):  Created for Patient: Clinical Summary   Notes:   I went over the procedure of extracorporal shockwave lithotripsy with the patient in quite detail. He understands the risk of poor fragmentation resulting in further ureteral intervention. He also understands that possibility of needing additional procedures to clear stones. Finally, we discussed the risks of hematoma. The patient is willing to proceed.  Instructed to contact office if he has any changes ie temp >100.5, intractable pain, or vomiting.    * Signed by Jimmey Ralph on 08/19/17 at 4:05 PM (EDT)*

## 2017-08-29 ENCOUNTER — Encounter (HOSPITAL_COMMUNITY)
Admission: RE | Disposition: A | Discharge status: Home / Self Care | Payer: Self-pay | Source: Ambulatory Visit | Attending: Urology

## 2017-08-29 ENCOUNTER — Encounter (HOSPITAL_COMMUNITY): Payer: Self-pay | Admitting: *Deleted

## 2017-08-29 ENCOUNTER — Ambulatory Visit (HOSPITAL_COMMUNITY)
Admission: RE | Admit: 2017-08-29 | Discharge: 2017-08-29 | Disposition: A | Discharge status: Home / Self Care | Payer: 59 | Source: Ambulatory Visit | Attending: Urology | Admitting: Urology

## 2017-08-29 ENCOUNTER — Ambulatory Visit (HOSPITAL_COMMUNITY): Payer: 59

## 2017-08-29 DIAGNOSIS — Z880 Allergy status to penicillin: Secondary | ICD-10-CM | POA: Insufficient documentation

## 2017-08-29 DIAGNOSIS — K5790 Diverticulosis of intestine, part unspecified, without perforation or abscess without bleeding: Secondary | ICD-10-CM | POA: Diagnosis not present

## 2017-08-29 DIAGNOSIS — Z7984 Long term (current) use of oral hypoglycemic drugs: Secondary | ICD-10-CM | POA: Diagnosis not present

## 2017-08-29 DIAGNOSIS — Z79899 Other long term (current) drug therapy: Secondary | ICD-10-CM | POA: Diagnosis not present

## 2017-08-29 DIAGNOSIS — Z7982 Long term (current) use of aspirin: Secondary | ICD-10-CM | POA: Insufficient documentation

## 2017-08-29 DIAGNOSIS — N201 Calculus of ureter: Secondary | ICD-10-CM

## 2017-08-29 DIAGNOSIS — N400 Enlarged prostate without lower urinary tract symptoms: Secondary | ICD-10-CM | POA: Insufficient documentation

## 2017-08-29 DIAGNOSIS — Z79891 Long term (current) use of opiate analgesic: Secondary | ICD-10-CM | POA: Diagnosis not present

## 2017-08-29 DIAGNOSIS — K56609 Unspecified intestinal obstruction, unspecified as to partial versus complete obstruction: Secondary | ICD-10-CM | POA: Diagnosis not present

## 2017-08-29 HISTORY — DX: Personal history of urinary calculi: Z87.442

## 2017-08-29 HISTORY — DX: Gastro-esophageal reflux disease without esophagitis: K21.9

## 2017-08-29 HISTORY — DX: Prediabetes: R73.03

## 2017-08-29 HISTORY — DX: Unspecified osteoarthritis, unspecified site: M19.90

## 2017-08-29 HISTORY — PX: EXTRACORPOREAL SHOCK WAVE LITHOTRIPSY: SHX1557

## 2017-08-29 SURGERY — LITHOTRIPSY, ESWL
Anesthesia: LOCAL | Laterality: Left

## 2017-08-29 MED ORDER — DIPHENHYDRAMINE HCL 25 MG PO CAPS
25.0000 mg | ORAL_CAPSULE | ORAL | Status: AC
  Administered 2017-08-29: 25 mg via ORAL
  Filled 2017-08-29: qty 1

## 2017-08-29 MED ORDER — SODIUM CHLORIDE 0.9 % IV SOLN
INTRAVENOUS | Status: DC
Start: 2017-08-29 — End: 2017-08-29
  Administered 2017-08-29: 08:00:00 via INTRAVENOUS

## 2017-08-29 MED ORDER — LACTATED RINGERS IV SOLN
INTRAVENOUS | Status: DC
  Administered 2017-08-29: 09:00:00 via INTRAVENOUS

## 2017-08-29 MED ORDER — CIPROFLOXACIN HCL 500 MG PO TABS
500.0000 mg | ORAL_TABLET | ORAL | Status: AC
  Administered 2017-08-29: 500 mg via ORAL
  Filled 2017-08-29: qty 1

## 2017-08-29 MED ORDER — DIAZEPAM 5 MG PO TABS
10.0000 mg | ORAL_TABLET | ORAL | Status: AC
  Administered 2017-08-29: 10 mg via ORAL
  Filled 2017-08-29: qty 2

## 2017-08-29 NOTE — Op Note (Signed)
See scanned chart for ESWL operative note. 

## 2017-08-29 NOTE — Interval H&P Note (Signed)
History and Physical Interval Note:  08/29/2017 8:05 AM  [name]  has presented today for surgery, with the diagnosis of LEFT URETEROPELVIC JUNCTION STONE  The various methods of treatment have been discussed with the patient and family. After consideration of risks, benefits and other options for treatment, the patient has consented to  Procedure(s): LEFT EXTRACORPOREAL SHOCK WAVE LITHOTRIPSY (ESWL) (Left) as a surgical intervention .  The patient's history has been reviewed, patient examined, no change in status, stable for surgery.  I have reviewed the patient's chart and labs.  Questions were answered to the patient's satisfaction.     Zaeda Mcferran,LES

## 2017-08-29 NOTE — Discharge Instructions (Addendum)
Moderate Conscious Sedation, Adult, Care After These instructions provide you with information about caring for yourself after your procedure. Your health care provider may also give you more specific instructions. Your treatment has been planned according to current medical practices, but problems sometimes occur. Call your health care provider if you have any problems or questions after your procedure. What can I expect after the procedure? After your procedure, it is common:  To feel sleepy for several hours.  To feel clumsy and have poor balance for several hours.  To have poor judgment for several hours.  To vomit if you eat too soon.  Follow these instructions at home: For at least 24 hours after the procedure:   Do not: ? Participate in activities where you could fall or become injured. ? Drive. ? Use heavy machinery. ? Drink alcohol. ? Take sleeping pills or medicines that cause drowsiness. ? Make important decisions or sign legal documents. ? Take care of children on your own.  Rest. Eating and drinking  Follow the diet recommended by your health care provider.  If you vomit: ? Drink water, juice, or soup when you can drink without vomiting. ? Make sure you have little or no nausea before eating solid foods. General instructions  Have a responsible adult stay with you until you are awake and alert.  Take over-the-counter and prescription medicines only as told by your health care provider.  If you smoke, do not smoke without supervision.  Keep all follow-up visits as told by your health care provider. This is important. Contact a health care provider if:  You keep feeling nauseous or you keep vomiting.  You feel light-headed.  You develop a rash.  You have a fever. Get help right away if:  You have trouble breathing. This information is not intended to replace advice given to you by your health care provider. Make sure you discuss any questions you have  with your health care provider. Document Released: 09/19/2013 Document Revised: 05/03/2016 Document Reviewed: 03/20/2016 Elsevier Interactive Patient Education  2018 [employer]. Kidney Stones Kidney stones (urolithiasis) are rock-like masses that form inside of the kidneys. Kidneys are organs that make pee (urine). A kidney stone can cause very bad pain and can block the flow of pee. The stone usually leaves your body (passes) through your pee. You may need to have a doctor take out the stone. Follow these instructions at home: Eating and drinking  Drink enough fluid to keep your pee clear or pale yellow. This will help you pass the stone.  If told by your doctor, change the foods you eat (your diet). This may include: ? Limiting how much salt (sodium) you eat. ? Eating more fruits and vegetables. ? Limiting how much meat, poultry, fish, and eggs you eat.  Follow instructions from your doctor about eating or drinking restrictions. General instructions  Collect pee samples as told by your doctor. You may need to collect a pee sample: ? 24 hours after a stone comes out. ? 8-12 weeks after a stone comes out, and every 6-12 months after that.  Strain your pee every time you pee (urinate), for as long as told. Use the strainer that your doctor recommends.  Do not throw out the stone. Keep it so that it can be tested by your doctor.  Take over-the-counter and prescription medicines only as told by your doctor.  Keep all follow-up visits as told by your doctor. This is important. You may need follow-up tests. Preventing  kidney stones To prevent another kidney stone:  Drink enough fluid to keep your pee clear or pale yellow. This is the best way to prevent kidney stones.  Eat healthy foods.  Avoid certain foods as told by your doctor. You may be told to eat less protein.  Stay at a healthy weight.  Contact a doctor if:  You have pain that gets worse or does not get better with  medicine. Get help right away if:  You have a fever or chills.  You get very bad pain.  You get new pain in your belly (abdomen).  You pass out (faint).  You cannot pee. This information is not intended to replace advice given to you by your health care provider. Make sure you discuss any questions you have with your health care provider. Document Released: 05/17/2008 Document Revised: 08/17/2016 Document Reviewed: 08/17/2016 Elsevier Interactive Patient Education  2017 [place] should strain your urine and collect all fragments and bring them to your follow up appointment.  2. You should take your pain medication as needed.  Please call if your pain is severe to the point that it is not controlled with your pain medication. 3. You should call if you develop fever > 101 or persistent nausea or vomiting. 4. Your doctor may prescribe tamsulosin to take to help facilitate stone passage.   ** YOU MAY RESTART YOUR ASPIRIN AND MELOXICAM 48 HOURS AFTER YOUR PROCEDURE IF YOU ARE NOT HAVING SIGNIFICANT BLEEDING IN THE URINE.  IF YOU ARE STILL BLEEDING, PLEASE CALL DR. Junious Silk FOR FURTHER ADVICE BEFORE STARTING YOUR MEDICATIONS AGAIN.

## 2017-09-01 ENCOUNTER — Encounter: Payer: Self-pay | Admitting: Internal Medicine

## 2017-09-09 DIAGNOSIS — Z23 Encounter for immunization: Secondary | ICD-10-CM | POA: Diagnosis not present

## 2017-09-20 DIAGNOSIS — N201 Calculus of ureter: Secondary | ICD-10-CM | POA: Diagnosis not present

## 2017-11-02 DIAGNOSIS — M150 Primary generalized (osteo)arthritis: Secondary | ICD-10-CM | POA: Diagnosis not present

## 2017-11-02 DIAGNOSIS — E663 Overweight: Secondary | ICD-10-CM | POA: Diagnosis not present

## 2017-11-02 DIAGNOSIS — Z6827 Body mass index (BMI) 27.0-27.9, adult: Secondary | ICD-10-CM | POA: Diagnosis not present

## 2017-11-02 DIAGNOSIS — M25511 Pain in right shoulder: Secondary | ICD-10-CM | POA: Diagnosis not present

## 2017-11-02 DIAGNOSIS — M0589 Other rheumatoid arthritis with rheumatoid factor of multiple sites: Secondary | ICD-10-CM | POA: Diagnosis not present

## 2017-11-16 DIAGNOSIS — N200 Calculus of kidney: Secondary | ICD-10-CM | POA: Diagnosis not present

## 2017-11-16 DIAGNOSIS — N401 Enlarged prostate with lower urinary tract symptoms: Secondary | ICD-10-CM | POA: Diagnosis not present

## 2017-11-16 DIAGNOSIS — R3912 Poor urinary stream: Secondary | ICD-10-CM | POA: Diagnosis not present

## 2017-12-09 IMAGING — CR DG CHEST 1V PORT
1 series · 1 of 1 positions shown · non-contrast
Comparison: Portable chest x-ray November 25, 2015

CLINICAL DATA: Intubated patient, respiratory failure, acute MI
with cardiac arrest, sepsis

EXAM:
PORTABLE CHEST 1 VIEW

[AP]
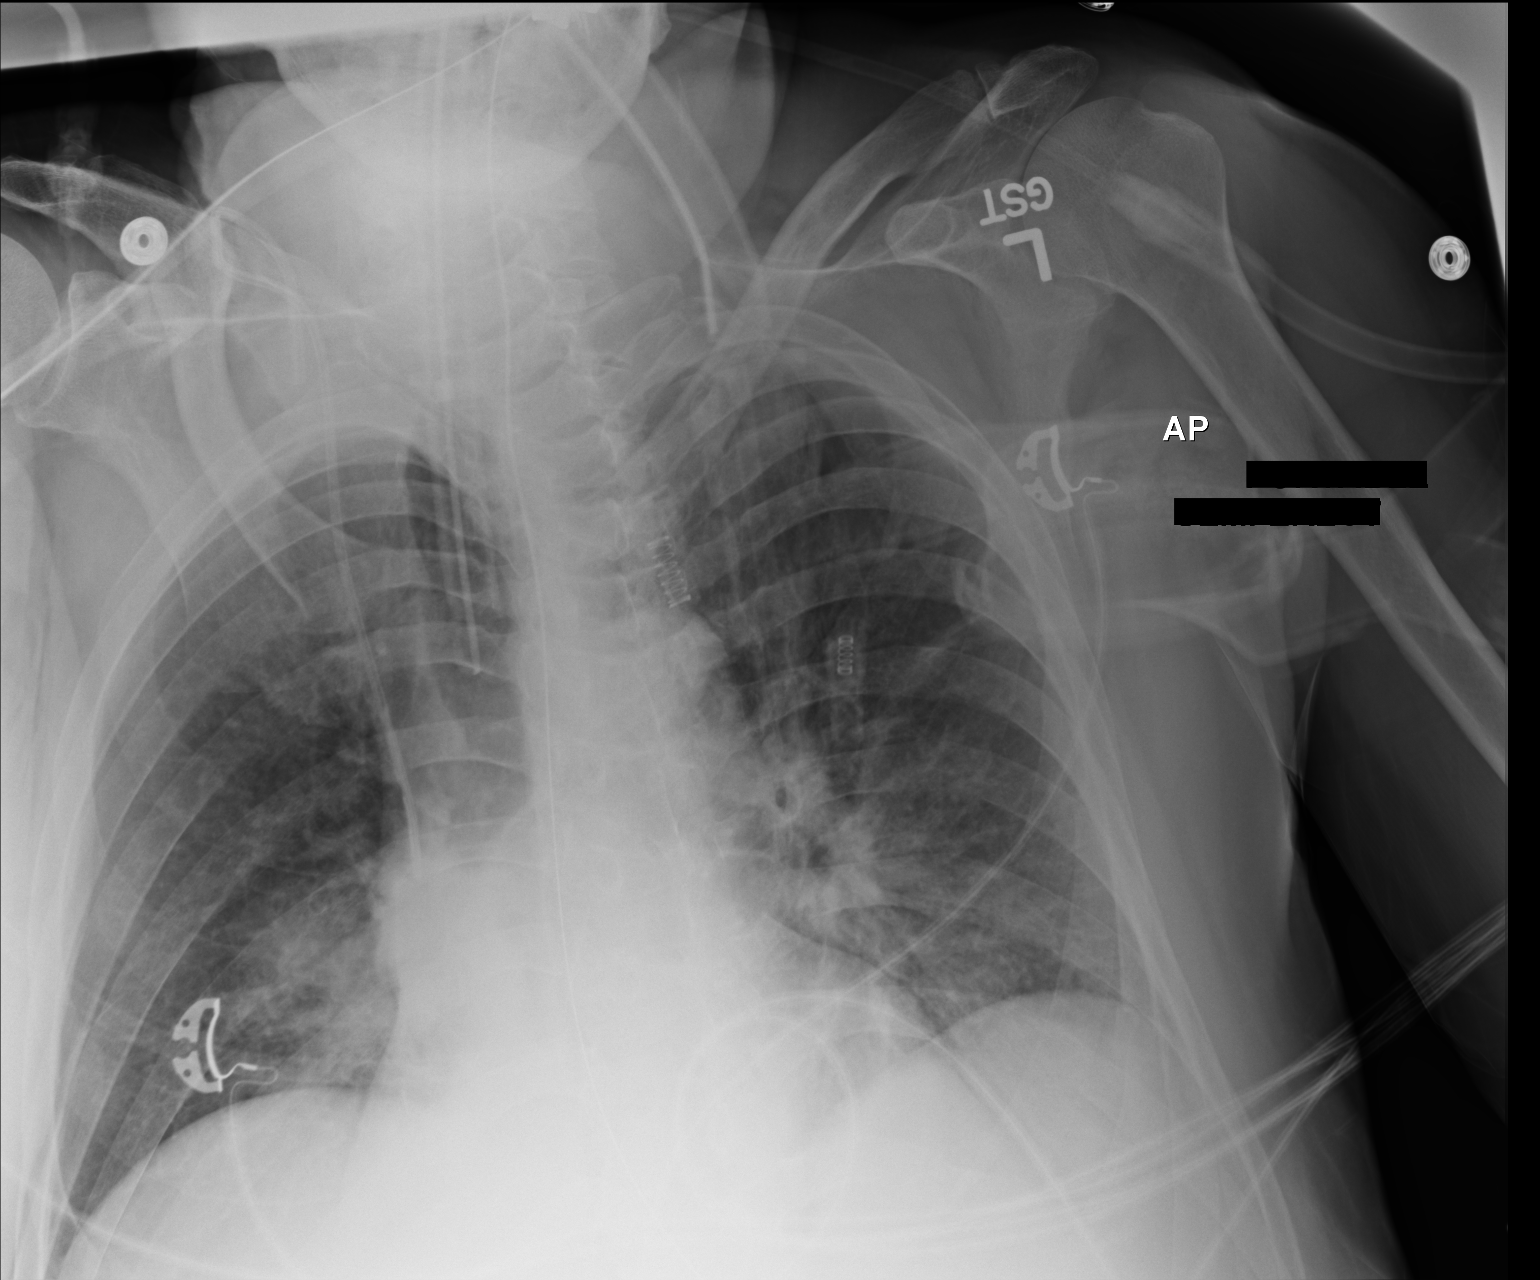

[1 of 1 positions shown; findings below may reference images not displayed]

FINDINGS: Patient positioning has changed. The lungs are reasonably well
inflated. There is persistent airspace opacity in the right
perihilar and infrahilar region on the right. Increased airspace
opacity on the left in the perihilar region has developed. There is
no pleural effusion or pneumothorax. The cardiac silhouette remains
enlarged. The central pulmonary vascularity is mildly prominent.

The endotracheal tube tip lies 3.3 cm above the carina. The
esophagogastric tube tip projects below the inferior margin of the
image. The right internal jugular venous catheter tip projects over
the midportion of the SVC.
IMPRESSION: Interval worsening of perihilar airspace opacities consistent with
atelectasis or pneumonia. Mild central pulmonary vascular prominence
is present as well. The support tubes are in reasonable position.

## 2017-12-10 IMAGING — CR DG CHEST 1V PORT
1 series · 1 of 1 positions shown · non-contrast
Comparison: 11/26/2015.

CLINICAL DATA: Intubation.

EXAM:
PORTABLE CHEST 1 VIEW

[AP]
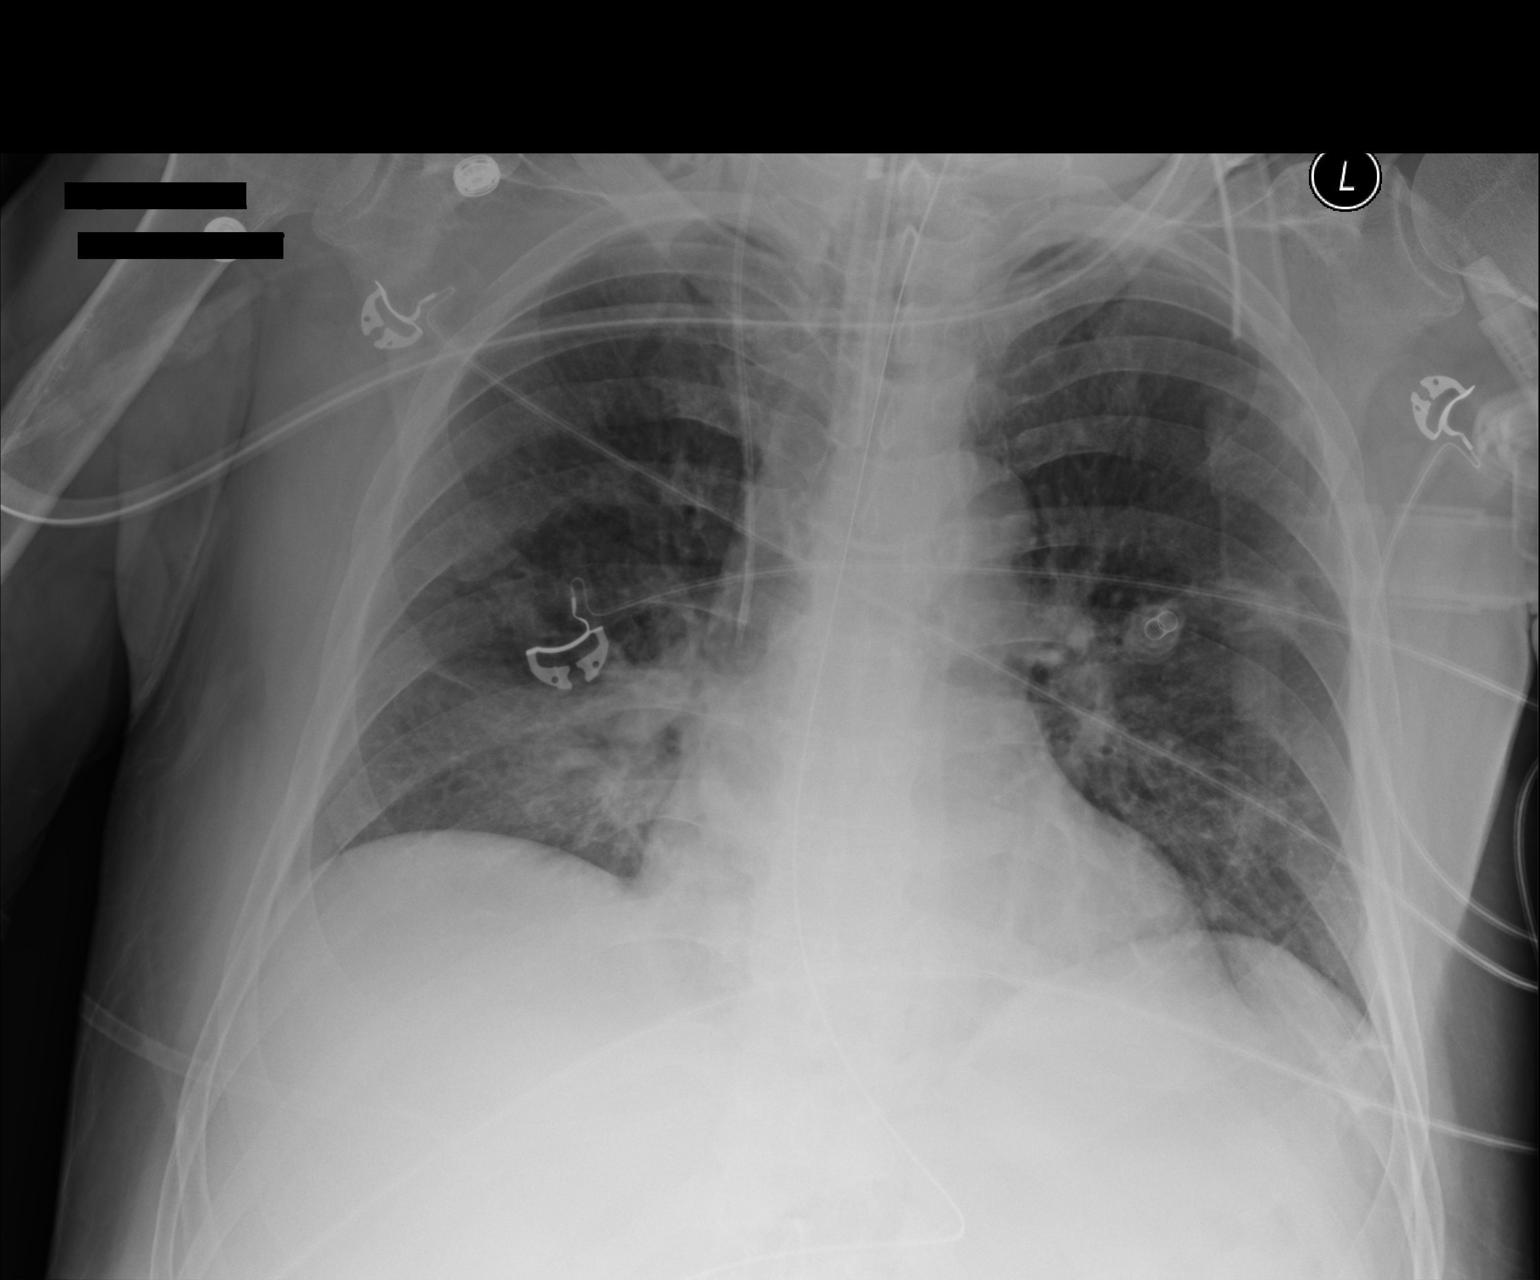

[1 of 1 positions shown; findings below may reference images not displayed]

FINDINGS: Endotracheal tube, NG tube, right IJ line in stable position. Heart
size stable. Persistent bibasilar infiltrates. No pleural effusion
or pneumothorax.
IMPRESSION: 1. Lines tubes in stable position.
2. Persistent bibasilar infiltrates.  No significant interim change.

## 2017-12-11 IMAGING — CR DG CHEST 1V PORT
1 series · 1 of 1 positions shown · non-contrast
Comparison: 11/27/2015.

CLINICAL DATA: Intubation.

EXAM:
PORTABLE CHEST 1 VIEW

[AP]
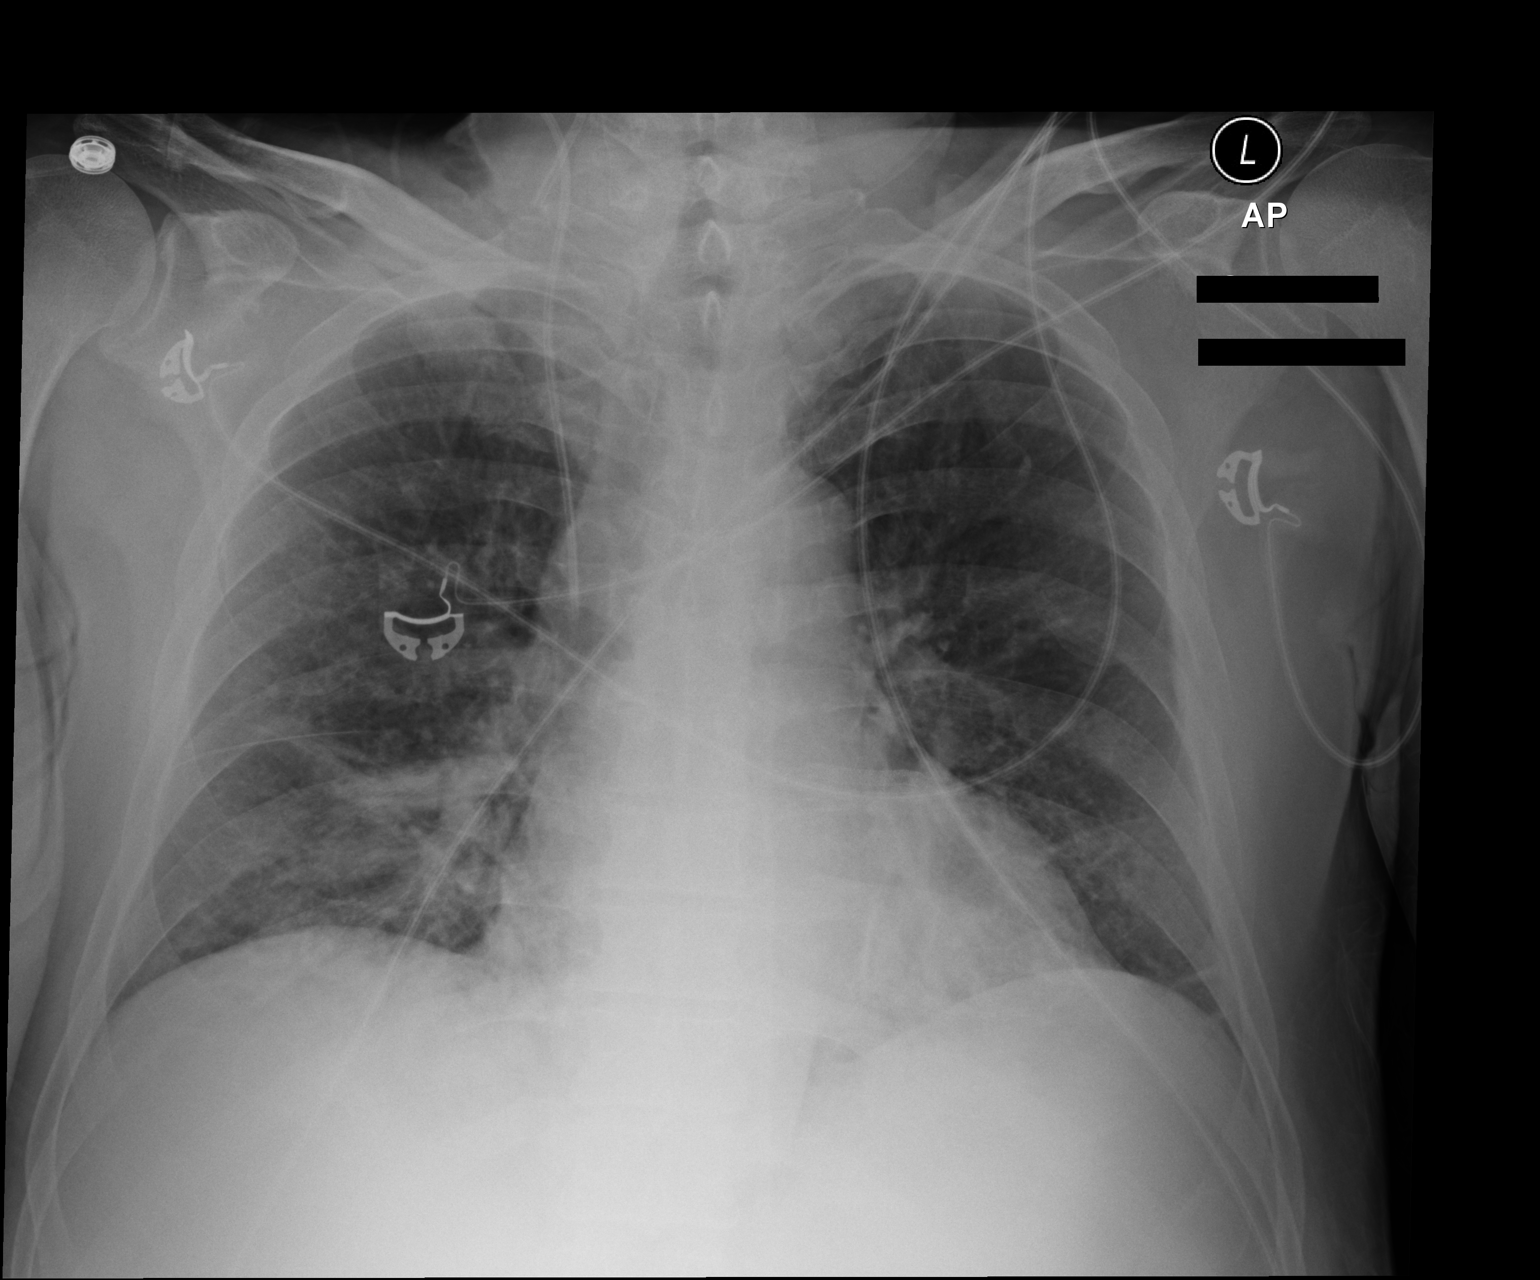

[1 of 1 positions shown; findings below may reference images not displayed]

FINDINGS: Interim extubation and removal of NG tube. Right IJ line in stable
position . Stable cardiomegaly. Low lung volumes with mild bibasilar
atelectasis and/or infiltrates again noted. No pleural effusion or
pneumothorax.
IMPRESSION: 1. Interim extubation removal of NG tube. Right IJ line stable
position.
2. Bibasilar subsegmental atelectasis and/or infiltrates again
noted. No significant interim change.

## 2017-12-11 IMAGING — RF DG SWALLOWING FUNCTION - NRPT [facility]
1 series · 18 of 24 positions shown · non-contrast
Comparison: none

[Series 1: run · 22 acquisitions, 18 frames shown]
[im 1/22]
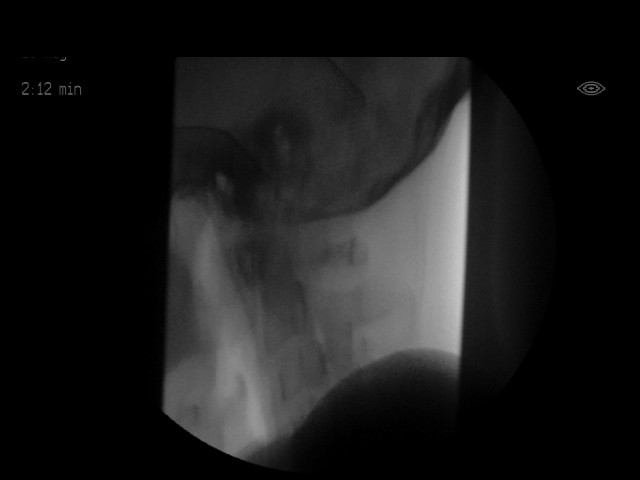
[im 2/22]
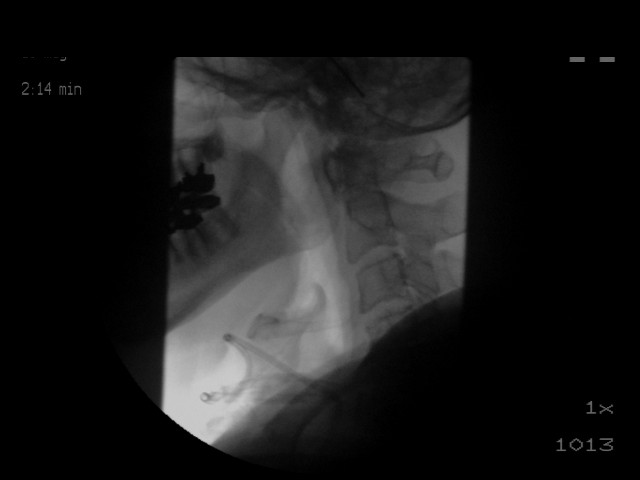
[im 3/22]
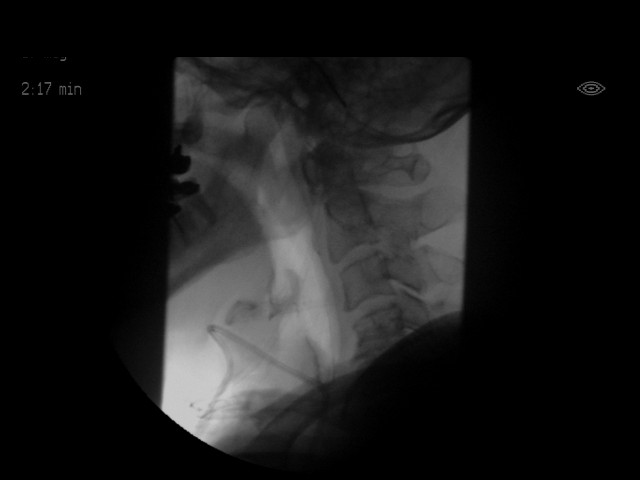
[im 4/22]
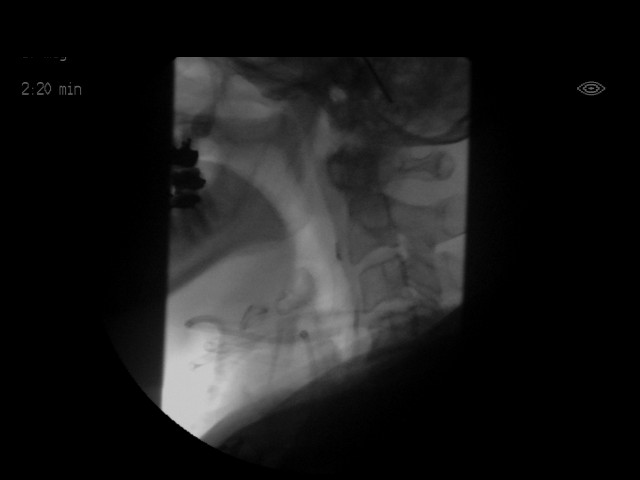
[im 6/22]
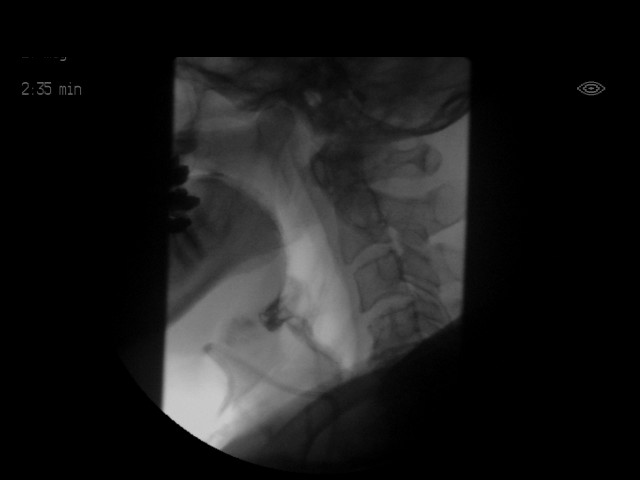
[im 7/22]
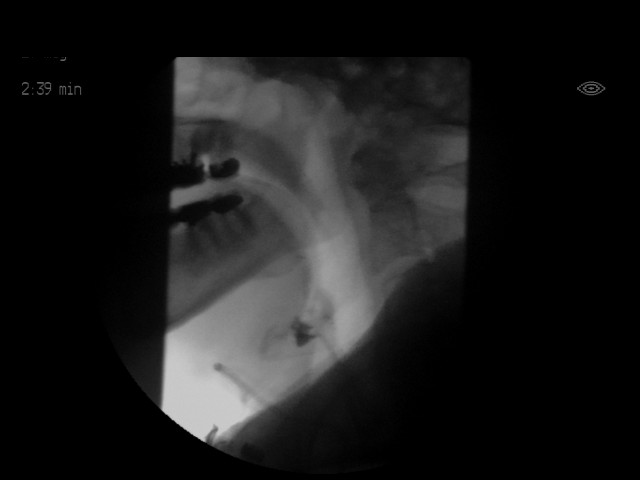
[im 8/22]
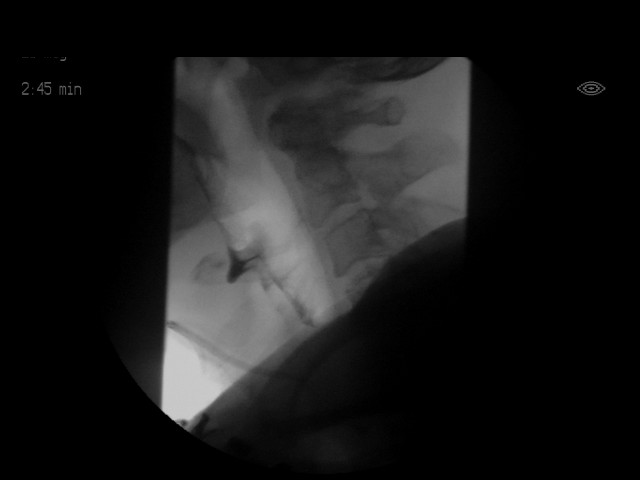
[im 10/22]
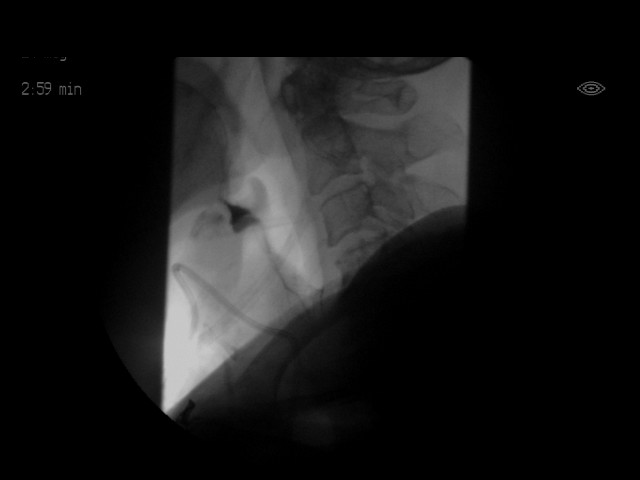
[im 11/22]
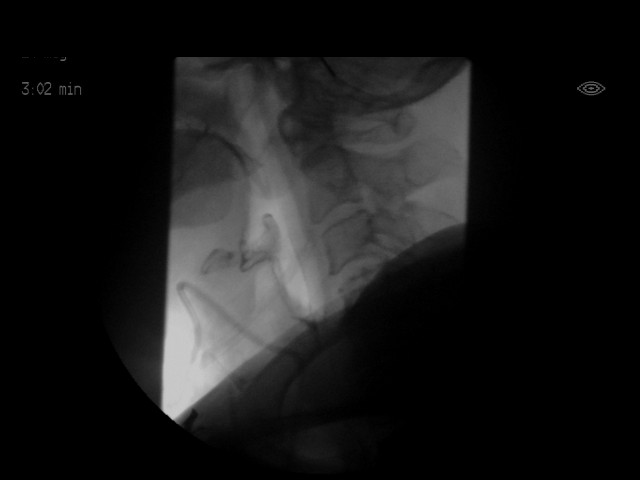
[im 12/22]
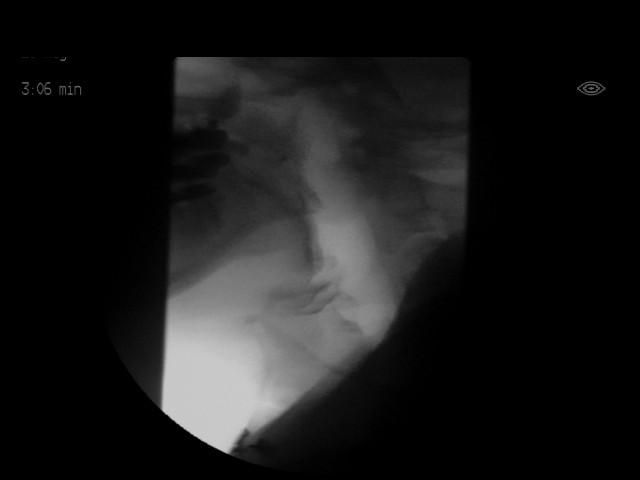
[im 14/22]
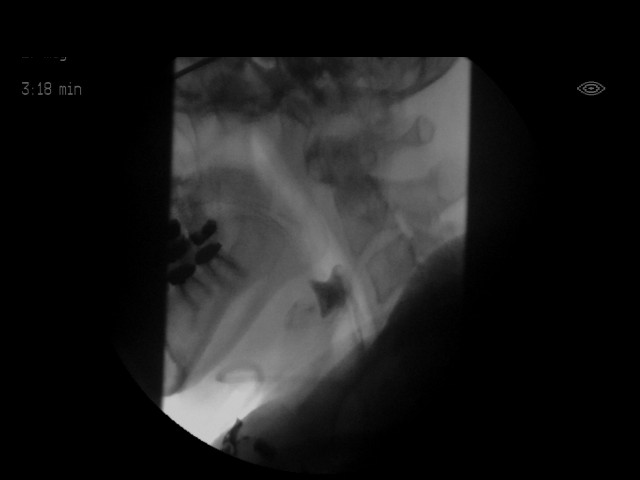
[im 15/22]
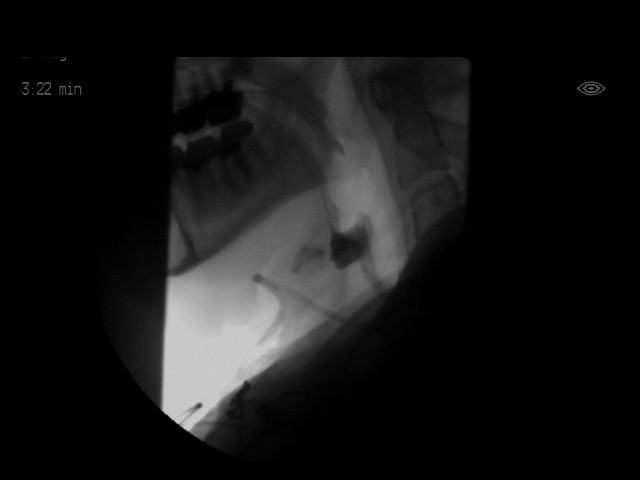
[im 16/22]
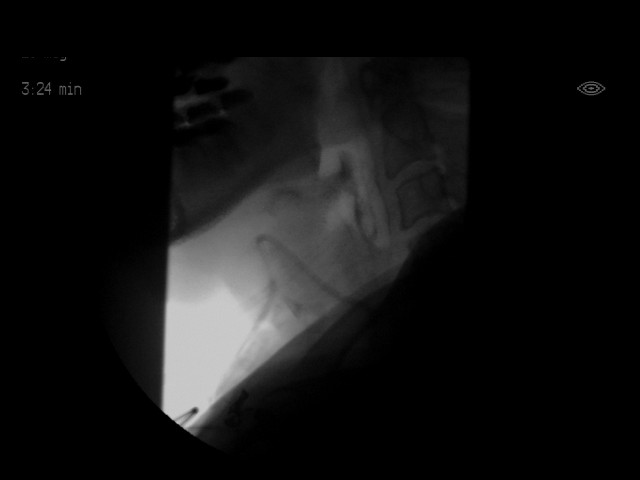
[im 18/22]
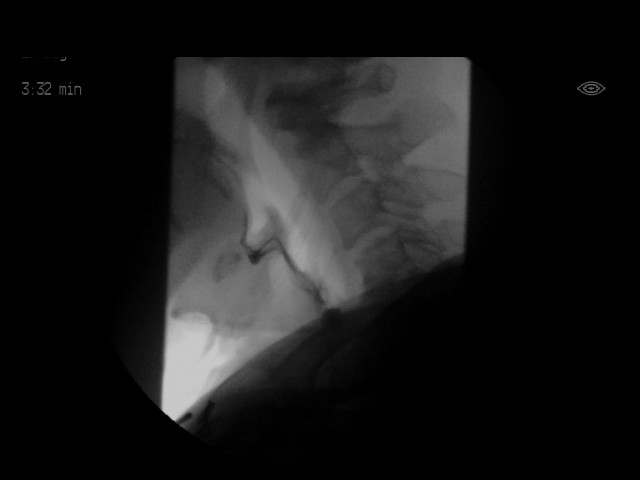
[im 19/22]
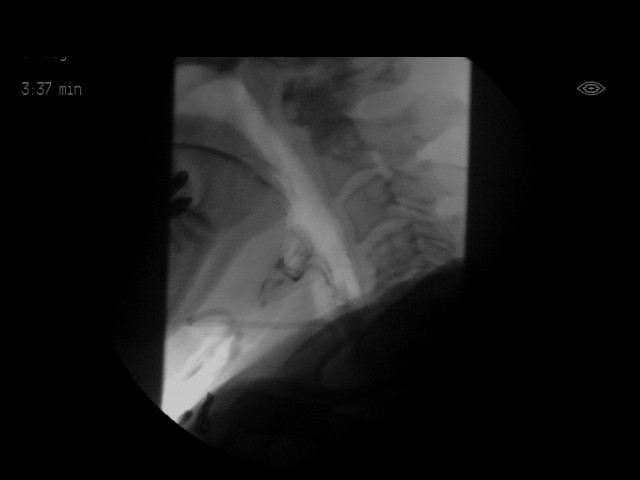
[im 20/22]
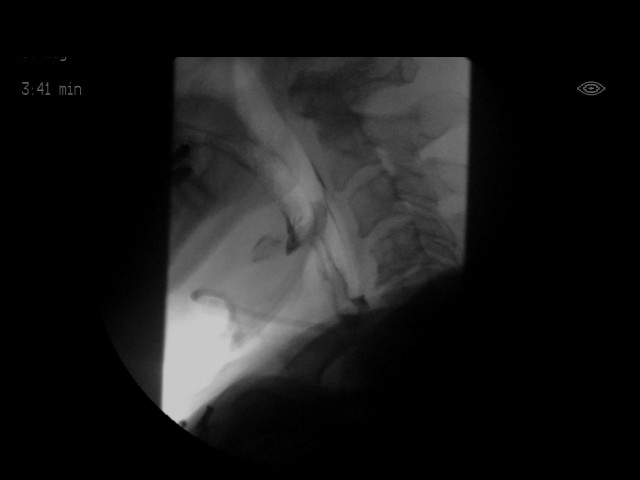
[im 21/22]
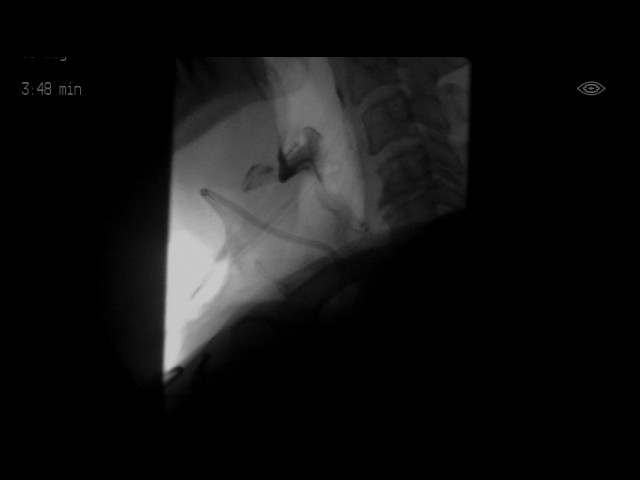
[im 22/22]
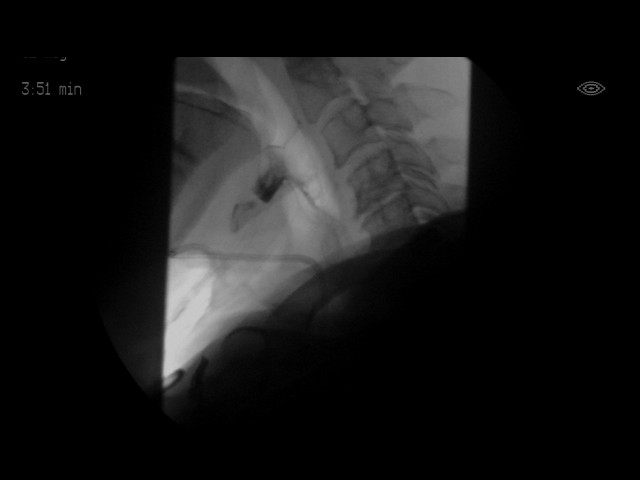

[18 of 24 positions shown; findings below may reference images not displayed]

FLUOROSCOPY FOR SWALLOWING FUNCTION STUDY:
Fluoroscopy was provided for swallowing function study, which was administered by a speech pathologist.  Final results and recommendations from this study are contained within the speech pathology report.

## 2018-01-13 ENCOUNTER — Other Ambulatory Visit: Payer: Self-pay | Admitting: Physician Assistant

## 2018-01-13 NOTE — Telephone Encounter (Signed)
Refill Request.  

## 2018-01-13 NOTE — Telephone Encounter (Signed)
REFILL 

## 2018-02-04 ENCOUNTER — Other Ambulatory Visit: Payer: Self-pay | Admitting: Internal Medicine

## 2018-02-07 DIAGNOSIS — M25511 Pain in right shoulder: Secondary | ICD-10-CM | POA: Diagnosis not present

## 2018-02-07 DIAGNOSIS — E663 Overweight: Secondary | ICD-10-CM | POA: Diagnosis not present

## 2018-02-07 DIAGNOSIS — Z6827 Body mass index (BMI) 27.0-27.9, adult: Secondary | ICD-10-CM | POA: Diagnosis not present

## 2018-02-07 DIAGNOSIS — M150 Primary generalized (osteo)arthritis: Secondary | ICD-10-CM | POA: Diagnosis not present

## 2018-02-07 DIAGNOSIS — M0589 Other rheumatoid arthritis with rheumatoid factor of multiple sites: Secondary | ICD-10-CM | POA: Diagnosis not present

## 2018-02-22 DIAGNOSIS — N200 Calculus of kidney: Secondary | ICD-10-CM | POA: Diagnosis not present

## 2018-02-22 DIAGNOSIS — N401 Enlarged prostate with lower urinary tract symptoms: Secondary | ICD-10-CM | POA: Diagnosis not present

## 2018-02-22 DIAGNOSIS — R3912 Poor urinary stream: Secondary | ICD-10-CM | POA: Diagnosis not present

## 2018-02-24 ENCOUNTER — Telehealth: Payer: Self-pay

## 2018-02-24 NOTE — Telephone Encounter (Signed)
Called patient and left message to return call

## 2018-02-24 NOTE — Telephone Encounter (Signed)
Please review chart for any documentation or results of a colonoscopy being performed and update Health Maintenance if results found. If no results found, please contact pt as he states he had this done several years ago. Also, please contact pt to advise regarding updating his Health Maintenance or what needs to be done next.

## 2018-02-24 NOTE — Telephone Encounter (Signed)
Noted  

## 2018-02-24 NOTE — Telephone Encounter (Signed)
Caller Name: Jai Steil Phone: [phone]  Returning call for [facility]

## 2018-02-24 NOTE — Telephone Encounter (Signed)
Pt is calling because he received an automated call about his "overdue" colonoscopy. He states that he has already done this and we should have the results. He would like to make sure his chart is updated so that he does not receive these calls any longer.  Per chart there is a Cologuard test, but I do not see any results for colonoscopy.  Tillie Rung, please review chart.

## 2018-02-27 NOTE — Telephone Encounter (Signed)
Will discuss colon cancer screening at the time of his next annual exam Please schedule for annual exam if this is due

## 2018-03-01 NOTE — Telephone Encounter (Signed)
Spoke to patient and clarified that we have his cologaurd test from august. He said he didn't know why he received a phone call about. No further action needed.

## 2018-05-03 DIAGNOSIS — E663 Overweight: Secondary | ICD-10-CM | POA: Diagnosis not present

## 2018-05-03 DIAGNOSIS — M150 Primary generalized (osteo)arthritis: Secondary | ICD-10-CM | POA: Diagnosis not present

## 2018-05-03 DIAGNOSIS — M0589 Other rheumatoid arthritis with rheumatoid factor of multiple sites: Secondary | ICD-10-CM | POA: Diagnosis not present

## 2018-05-03 DIAGNOSIS — Z6826 Body mass index (BMI) 26.0-26.9, adult: Secondary | ICD-10-CM | POA: Diagnosis not present

## 2018-05-03 DIAGNOSIS — M25511 Pain in right shoulder: Secondary | ICD-10-CM | POA: Diagnosis not present

## 2018-05-04 ENCOUNTER — Telehealth: Payer: Self-pay | Admitting: Internal Medicine

## 2018-05-04 NOTE — Telephone Encounter (Signed)
Copied from [place] [id number]. Topic: Quick Communication - Rx Refill/Question >> May 04, 2018  1:20 PM [name] wrote: Medication: metFORMIN (GLUCOPHAGE) 500 MG tablet  Has the patient contacted their pharmacy? Yes.  On 05-01-18 (Agent: If no, request that the patient contact the pharmacy for the refill.) (Agent: If yes, when and what did the pharmacy advise?)  Preferred Pharmacy (with phone number or street name):[facility] [place], [place] [phone] (Phone) [phone] (Fax)       Agent: Please be advised that RX refills may take up to 3 business days. We ask that you follow-up with your pharmacy.

## 2018-05-04 NOTE — Telephone Encounter (Signed)
Refill request for Metformin, last filled by historical provider.   LOV: 07/27/17 Dr. Elijio Miles

## 2018-05-05 ENCOUNTER — Telehealth: Payer: Self-pay | Admitting: Internal Medicine

## 2018-05-05 NOTE — Telephone Encounter (Signed)
Left message to return call 

## 2018-05-05 NOTE — Telephone Encounter (Signed)
Copied from [place] [phone]. Topic: Quick Communication - Rx Refill/Question >> May 04, 2018  1:20 PM Celedonio Savage L wrote: Medication: metFORMIN (GLUCOPHAGE) 500 MG tablet  Has the patient contacted their pharmacy? Yes.  On 05-01-18 (Agent: If no, request that the patient contact the pharmacy for the refill.) (Agent: If yes, when and what did the pharmacy advise?)  Preferred Pharmacy (with phone number or street name):[facility], [place] [phone] (Phone) [phone] (Fax)       Agent: Please be advised that RX refills may take up to 3 business days. We ask that you follow-up with your pharmacy. >> May 05, 2018 10:47 AM Cleaster Corin, NT wrote: Pt. Calling back to speak with  Tillie Rung (not available at the time of call) let pt. Know she will return his call

## 2018-05-09 ENCOUNTER — Encounter: Payer: Self-pay | Admitting: Internal Medicine

## 2018-05-09 ENCOUNTER — Other Ambulatory Visit: Payer: Self-pay

## 2018-05-09 MED ORDER — METFORMIN HCL 500 MG PO TABS: 500.0000 mg | ORAL_TABLET | Freq: Two times a day (BID) | ORAL | 2 refills | Status: DC

## 2018-05-13 ENCOUNTER — Other Ambulatory Visit: Payer: Self-pay | Admitting: Internal Medicine

## 2018-05-15 ENCOUNTER — Encounter: Payer: Self-pay | Admitting: Internal Medicine

## 2018-05-20 ENCOUNTER — Other Ambulatory Visit: Payer: Self-pay | Admitting: Cardiovascular Disease

## 2018-05-29 DIAGNOSIS — N200 Calculus of kidney: Secondary | ICD-10-CM | POA: Diagnosis not present

## 2018-05-29 DIAGNOSIS — R8279 Other abnormal findings on microbiological examination of urine: Secondary | ICD-10-CM | POA: Diagnosis not present

## 2018-05-29 DIAGNOSIS — N201 Calculus of ureter: Secondary | ICD-10-CM | POA: Diagnosis not present

## 2018-05-29 DIAGNOSIS — R1084 Generalized abdominal pain: Secondary | ICD-10-CM | POA: Diagnosis not present

## 2018-06-01 ENCOUNTER — Ambulatory Visit (INDEPENDENT_AMBULATORY_CARE_PROVIDER_SITE_OTHER): Payer: 59 | Admitting: Cardiology

## 2018-06-01 ENCOUNTER — Encounter: Payer: Self-pay | Admitting: Cardiology

## 2018-06-01 ENCOUNTER — Encounter

## 2018-06-01 VITALS — BP 124/78 | HR 65 | Ht 70.0 in | Wt 182.8 lb

## 2018-06-01 DIAGNOSIS — Z01810 Encounter for preprocedural cardiovascular examination: Secondary | ICD-10-CM

## 2018-06-01 DIAGNOSIS — E785 Hyperlipidemia, unspecified: Secondary | ICD-10-CM

## 2018-06-01 DIAGNOSIS — Z9861 Coronary angioplasty status: Secondary | ICD-10-CM | POA: Diagnosis not present

## 2018-06-01 DIAGNOSIS — I10 Essential (primary) hypertension: Secondary | ICD-10-CM

## 2018-06-01 DIAGNOSIS — I2510 Atherosclerotic heart disease of native coronary artery without angina pectoris: Secondary | ICD-10-CM | POA: Diagnosis not present

## 2018-06-01 NOTE — Patient Instructions (Signed)
Medication Instructions: Your physician recommends that you continue on your current medications as directed. Please refer to the Current Medication list given to you today.   Labwork: None  Procedures/Testing: None  Follow-Up: Your physician wants you to follow-up in: 1 year with Dr.Cooper  You will receive a reminder letter in the mail two months in advance. If you don't receive a letter, please call our office to schedule the follow-up appointment.   Any Additional Special Instructions Will Be Listed Below (If Applicable).     If you need a refill on your cardiac medications before your next appointment, please call your pharmacy.

## 2018-06-01 NOTE — Progress Notes (Signed)
Cardiology Office Note:    Date:  06/01/2018   ID:  [name], DOB 1946/06/20, MRN [id number]  PCP:  Vivi Barrack, MD  Cardiologist:  Sherren Mocha, MD  Referring MD: Marletta Lor, MD   Chief Complaint  Patient presents with  . Follow-up    CAD    History of Present Illness:    [name] is a 72 y.o. male with a past medical history significant for CAD s/p anterior STEMI (out of hospital arrest) d/t total occlusion of prox LAD treated with DES, initially severe LV dysfunction, improved with revascularization.   He was last seen in the office for follow up by Dr. Burt Knack on 06/01/17 at which time he was doing very well, walking for exercise and having no exertional symptoms.   He is riding a bike outside, treadmill and push mows- average 3 miles. He tries to be as active as possible. He builds things and dose projects for his daughter. He is working on staining his 5 decks. He tries to eat heart healthy, salads, fruits and vegetables. Water is his only beverage. Very little red meat and no fast food.   No chest pain/pressure/ tightness or shortness of breath. He has aches and pains from his kidney stone, left flank.   Pt needs cystoscopy for kidney stone removal.   His symptom prior to his MI, once he had mild shortness of breath and indigestion about a month prior then no further symptoms up to the MI when he just went out. .   Will have a new PCP Dimas Chyle.   Dr. Junious Silk- [facility]. Is off aspirin for 5 days. Per Dr. Burt Knack OK to hold aspirin for 3 days with his lithotripsy in the fall.   -  Past Medical History:  Diagnosis Date  . Allergic rhinitis   . Arthritis   . CAD (coronary artery disease)    a. 11/11/15 - OOH VF arrest 2/2 ant STEMI >> CPR//AED shock x 2 >> ROSC >> LHC: pLAD 100 (Promus DES), mLCx 50, mRCA 30, EF 25% [managed with hypothermia protocol]  . Fractured rib   . GERD (gastroesophageal reflux disease)   . History of  Clostridium difficile colitis 10/2015   post MI >> required IV Flagyl and IV Vanc >> DC on po Vanc  . History of kidney stones   . HLD (hyperlipidemia)   . Hypertension   . Impaired glucose tolerance   . Insomnia   . Ischemic cardiomyopathy    a. EF at [facility] 11/11/15 25%;  b. Echo 11/14/15: EF 50-55%, dist ant and anterior septal HK  . Nephrolithiasis 1968  . Pelvic fracture ([place]) 09/2015  . Pre-diabetes   . Rheumatoid arthritis(714.0) 1998  . ST elevation (STEMI) myocardial infarction involving left anterior descending coronary artery ([place]) 11/11/15   100% LAD, Cardiogenic Shock. -- EF ~20-25%    Past Surgical History:  Procedure Laterality Date  . CARDIAC CATHETERIZATION N/A 11/11/2015   Procedure: Left Heart Cath and Cors/Grafts Angiography;  Surgeon: Sherren Mocha, MD;  Location: [place] [facility];  Service: Cardiovascular;  Laterality: N/A;  . CARDIAC CATHETERIZATION N/A 11/11/2015   Procedure: Coronary Stent Intervention;  Surgeon: Sherren Mocha, MD;  Location: [place] [facility];  Service: Cardiovascular;  Laterality: N/A;  prox lad 3.5x38 promus  . CORONARY ANGIOPLASTY WITH STENT PLACEMENT  2016  . EXTRACORPOREAL SHOCK WAVE LITHOTRIPSY Left 08/29/2017   Procedure: LEFT EXTRACORPOREAL SHOCK WAVE LITHOTRIPSY (ESWL);  Surgeon: Raynelle Bring, MD;  Location: [facility];  Service: Urology;  Laterality: Left;  . status post multiple lithotripy and urological surgery for reccurent calcium oxalate stones      Current Medications: Current Meds  Medication Sig  . aspirin EC 81 MG tablet Take 81 mg by mouth daily.  Marland Kitchen atorvastatin (LIPITOR) 80 MG tablet TAKE 1 TABLET BY MOUTH  DAILY AT 6PM. Please keep upcoming appt for future refills. Thank you  . cetirizine (ZYRTEC) 10 MG tablet Take 10 mg by mouth daily.  . Cholecalciferol (VITAMIN D-3) 1000 UNITS CAPS Take 5,000 Units by mouth daily.   . famotidine (PEPCID) 20 MG tablet Take 1 tablet (20 mg total) by mouth 2 (two) times daily.  .  finasteride (PROSCAR) 5 MG tablet Take 5 mg by mouth daily.  . fluticasone (FLONASE) 50 MCG/ACT nasal spray USE 2 SPRAYS IN EACH  NOSTRIL DAILY  . folic acid (FOLVITE) 1 MG tablet Take 1 mg by mouth daily.    Marland Kitchen HYDROcodone-acetaminophen (NORCO/VICODIN) 5-325 MG tablet Take 1-2 tablets by mouth every 6 (six) hours as needed for moderate pain.  . meloxicam (MOBIC) 7.5 MG tablet Take 7.5 mg by mouth 2 (two) times daily.  . metFORMIN (GLUCOPHAGE) 500 MG tablet Take 1 tablet (500 mg total) by mouth 2 (two) times daily with a meal.  . methotrexate (RHEUMATREX) 2.5 MG tablet Take 15 mg by mouth once a week. Caution:Chemotherapy. Protect from light.  . metoprolol tartrate (LOPRESSOR) 25 MG tablet TAKE ONE-HALF TABLET BY  MOUTH TWO TIMES DAILY  . nitroGLYCERIN (NITROSTAT) 0.4 MG SL tablet Place 1 tablet under the tongue as needed. Every 5 minutes for chest pain  . Probiotic Product (PRO-BIOTIC BLEND PO) Take 1 tablet by mouth daily.  . tamsulosin (FLOMAX) 0.4 MG CAPS capsule TAKE 1 CAPSULE BY MOUTH  DAILY  . traMADol (ULTRAM) 50 MG tablet Take 50 mg by mouth every 6 (six) hours as needed for moderate pain.  . traZODone (DESYREL) 50 MG tablet TAKE 1 TABLET BY MOUTH AT  BEDTIME     Allergies:   Penicillins; Contrast media [iodinated diagnostic agents]; Sulfamethoxazole; and Ibuprofen   Social History   Socioeconomic History  . Marital status: Married    Spouse name: Not on file  . Number of children: Not on file  . Years of education: Not on file  . Highest education level: Not on file  Occupational History  . Not on file  Social Needs  . Financial resource strain: Not on file  . Food insecurity:    Worry: Not on file    Inability: Not on file  . Transportation needs:    Medical: Not on file    Non-medical: Not on file  Tobacco Use  . 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 Smokeless tobacco: Never Used  . Tobacco comment: quit 30 yrs ago  Substance and Sexual Activity  . Alcohol use: No  .  Drug use: No  . Sexual activity: Not on file  Lifestyle  . Physical activity:    Days per week: Not on file    Minutes per session: Not on file  . Stress: Not on file  Relationships  . Social connections:    Talks on phone: Not on file    Gets together: Not on file    Attends religious service: Not on file    Active member of club or organization: Not on file    Attends meetings of clubs or organizations: Not on file    Relationship status:  Not on file  Other Topics Concern  . Not on file  Social History Narrative  . Not on file     Family History: The patient's family history includes Cancer in his mother; Diabetes in his father. ROS:   Please see the history of present illness.     All other systems reviewed and are negative.  EKGs/Labs/Other Studies Reviewed:    The following studies were reviewed today:  2D Echo 11/14/2015: Left ventricle: The cavity size was normal. Systolic function was normal. The estimated ejection fraction was in the range of 50% to 55%. There may be hypokinesis of the distal anterior wall and anterior septum. The study is not technically sufficient to allow evaluation of LV diastolic function.  Cardiac Cath 11/11/2015: Conclusion   1. Total occlusion of the proximal LAD, treated successfully with primary PCI (drug-eluting stent) 2. Nonobstructive LCx and RCA stenoses 3. Severe segmental LV systolic dysfunction, LVEF 25%  Recommendations: hypothermia protocol per CCM team, post-MI medical therapy and supportive care in CCU setting.  Indications        Post-Intervention Diagram          EKG:  EKG is ordered today.  The ekg ordered today demonstrates NSR, 65 bpm, no changes  Recent Labs: 07/27/2017: ALT 35; BUN 21; Creatinine, Ser 0.93; Hemoglobin 15.9; Platelets 170.0; Potassium 4.2; Sodium 139; TSH 1.97   Recent Lipid Panel    Component Value Date/Time   CHOL 88 07/27/2017 1024   TRIG 97.0 07/27/2017 1024   HDL 32.80  (L) 07/27/2017 1024   CHOLHDL 3 07/27/2017 1024   VLDL 19.4 07/27/2017 1024   LDLCALC 36 07/27/2017 1024    Physical Exam:    VS:  BP 124/78   Pulse 65   Ht 5\' 10"  (1.778 m)   Wt 182 lb 12.8 oz (82.9 kg)   BMI 26.23 kg/m     Wt Readings from Last 3 Encounters:  06/01/18 182 lb 12.8 oz (82.9 kg)  08/29/17 174 lb 4 oz (79 kg)  07/27/17 178 lb 12.8 oz (81.1 kg)     Physical Exam  Constitutional: He is oriented to person, place, and time. He appears well-developed and well-nourished. No distress.  HENT:  Head: Normocephalic and atraumatic.  Neck: Normal range of motion. Neck supple. No JVD present.  Cardiovascular: Normal rate, regular rhythm, normal heart sounds and intact distal pulses. Exam reveals no gallop and no friction rub.  No murmur heard. Pulmonary/Chest: Effort normal and breath sounds normal. No respiratory distress. He has no wheezes. He has no rales.  Abdominal: Soft. Bowel sounds are normal.  Musculoskeletal: Normal range of motion. He exhibits no edema or deformity.  Neurological: He is alert and oriented to person, place, and time.  Skin: Skin is warm and dry.  Psychiatric: He has a normal mood and affect. His behavior is normal. Thought content normal.  Vitals reviewed.    ASSESSMENT:    1. CAD S/P PCI OF pLAD with 3.5 mm x38 mm Promus DES (3.75 mm)   2. Essential hypertension   3. Hyperlipidemia LDL goal <70   4. Preop cardiovascular exam    PLAN:    In order of problems listed above:  CAD: S/P STEMI 11/11/15- DES to prox LAD. On aspirin, statin, beta-blocker. Is very active with exertional symptoms. Is very good about his diet. Continue current therapy.  Hyperlipidemia: On atorvasttin 80 mg daily. Lipids folowed by Dr. Amil Amen (rheumatologist). LDL goal <70. LDL was 36 in 07/2017, at goal.  Liver function labs in normal range.   Essential hypertension: BP well control. Continue current therapy.   *Pt needs cystoscopy for kidney stone removal  planned for next Tuesday. He will be low risk for this procedure. Last Fall Dr. Burt Knack had recommended to only hold aspirin for 3 days prior to procedures if possible. Pt was told by the urology office to hold aspirin for 5 days and he has already stopped it. He should resume the aspirin as soon as safe after the procedure. I will send this note to [facility].    Medication Adjustments/Labs and Tests Ordered: Current medicines are reviewed at length with the patient today.  Concerns regarding medicines are outlined above. Labs and tests ordered and medication changes are outlined in the patient instructions below:  Patient Instructions  Medication Instructions: Your physician recommends that you continue on your current medications as directed. Please refer to the Current Medication list given to you today.   Labwork: None  Procedures/Testing: None  Follow-Up: Your physician wants you to follow-up in: 1 year with Dr.Cooper  You will receive a reminder letter in the mail two months in advance. If you don't receive a letter, please call our office to schedule the follow-up appointment.   Any Additional Special Instructions Will Be Listed Below (If Applicable).     If you need a refill on your cardiac medications before your next appointment, please call your pharmacy.      Signed, Daune Perch, NP  06/01/2018 6:01 PM    [facility]

## 2018-06-06 DIAGNOSIS — N200 Calculus of kidney: Secondary | ICD-10-CM | POA: Diagnosis not present

## 2018-06-06 DIAGNOSIS — R310 Gross hematuria: Secondary | ICD-10-CM | POA: Diagnosis not present

## 2018-06-19 ENCOUNTER — Other Ambulatory Visit: Payer: Self-pay | Admitting: Internal Medicine

## 2018-06-29 ENCOUNTER — Encounter: Payer: Self-pay | Admitting: Family Medicine

## 2018-06-29 ENCOUNTER — Ambulatory Visit (INDEPENDENT_AMBULATORY_CARE_PROVIDER_SITE_OTHER): Payer: 59 | Admitting: Family Medicine

## 2018-06-29 VITALS — BP 126/74 | HR 75 | Temp 98.3°F | Ht 70.0 in | Wt 178.0 lb

## 2018-06-29 DIAGNOSIS — M069 Rheumatoid arthritis, unspecified: Secondary | ICD-10-CM | POA: Diagnosis not present

## 2018-06-29 DIAGNOSIS — E785 Hyperlipidemia, unspecified: Secondary | ICD-10-CM

## 2018-06-29 DIAGNOSIS — M05742 Rheumatoid arthritis with rheumatoid factor of left hand without organ or systems involvement: Secondary | ICD-10-CM | POA: Diagnosis not present

## 2018-06-29 DIAGNOSIS — I10 Essential (primary) hypertension: Secondary | ICD-10-CM

## 2018-06-29 DIAGNOSIS — E1169 Type 2 diabetes mellitus with other specified complication: Secondary | ICD-10-CM | POA: Diagnosis not present

## 2018-06-29 DIAGNOSIS — E1159 Type 2 diabetes mellitus with other circulatory complications: Secondary | ICD-10-CM | POA: Diagnosis not present

## 2018-06-29 DIAGNOSIS — I152 Hypertension secondary to endocrine disorders: Secondary | ICD-10-CM

## 2018-06-29 DIAGNOSIS — E1151 Type 2 diabetes mellitus with diabetic peripheral angiopathy without gangrene: Secondary | ICD-10-CM | POA: Diagnosis not present

## 2018-06-29 DIAGNOSIS — L608 Other nail disorders: Secondary | ICD-10-CM | POA: Insufficient documentation

## 2018-06-29 DIAGNOSIS — R140 Abdominal distension (gaseous): Secondary | ICD-10-CM

## 2018-06-29 LAB — COMPREHENSIVE METABOLIC PANEL
ALT: 22 U/L (ref 0–53)
AST: 15 U/L (ref 0–37)
Albumin: 4.5 g/dL (ref 3.5–5.2)
Alkaline Phosphatase: 108 U/L (ref 39–117)
BUN: 27 mg/dL — ABNORMAL HIGH (ref 6–23)
CHLORIDE: 106 meq/L (ref 96–112)
CO2: 29 meq/L (ref 19–32)
Calcium: 9.8 mg/dL (ref 8.4–10.5)
Creatinine, Ser: 1.48 mg/dL (ref 0.40–1.50)
GFR: 49.74 mL/min — ABNORMAL LOW (ref >60.00)
GLUCOSE: 106 mg/dL — AB (ref 70–99)
POTASSIUM: 4.8 meq/L (ref 3.5–5.1)
SODIUM: 141 meq/L (ref 135–145)
TOTAL PROTEIN: 6.8 g/dL (ref 6.0–8.3)
Total Bilirubin: 0.5 mg/dL (ref 0.2–1.2)

## 2018-06-29 LAB — CBC
HEMATOCRIT: 45.6 % (ref 39.0–52.0)
HEMOGLOBIN: 15.6 g/dL (ref 13.0–17.0)
MCHC: 34.2 g/dL (ref 30.0–36.0)
MCV: 93.7 fl (ref 78.0–100.0)
Platelets: 215 10*3/uL (ref 150.0–400.0)
RBC: 4.87 Mil/uL (ref 4.22–5.81)
RDW: 14 % (ref 11.5–15.5)
WBC: 6.7 10*3/uL (ref 4.0–10.5)

## 2018-06-29 LAB — LIPID PANEL
CHOL/HDL RATIO: 3
Cholesterol: 111 mg/dL (ref 0–200)
HDL: 33.3 mg/dL — AB (ref >39.00)
NONHDL: 78.06
Triglycerides: 214 mg/dL — ABNORMAL HIGH (ref 0.0–149.0)
VLDL: 42.8 mg/dL — AB (ref 0.0–40.0)

## 2018-06-29 LAB — LDL CHOLESTEROL, DIRECT: LDL DIRECT: 62 mg/dL

## 2018-06-29 LAB — HEMOGLOBIN A1C: HEMOGLOBIN A1C: 7 % — AB (ref 4.6–6.5)

## 2018-06-29 NOTE — Progress Notes (Signed)
Subjective:  [name] is a 72 y.o. male who presents today with a chief complaint of nail deformity and to transfer care to the office.   HPI:  Nail Deformity, chronic problem Severe history.  Located on right big toe.  Has been diagnosed with toenail fungus in the past he started on oral medications which did not work.  He has progressively follow down the nail which works temporarily however symptoms are gone.  Occasionally gets pain in the area that is improved with decompression.  Abdominal bloating, new problem Severe history.  Gets bloating in the evening and at night.  Resolves by the next morning.  No associated nausea, vomiting, fevers, chills, or pain.  No obvious precipitating events.  Type 2 diabetes, chronic problem, stable Several year history.  On metformin 500 mg twice daily.  Tolerates well without side effects.  Hyperlipidemia, chronic problem, stable On Lipitor 80 mg daily.  Tolerating well without reported side effects.  Hypertension, chronic problem, stable On metoprolol 12.5 mg twice daily.  Tolerating well without reported side effects.  ROS: Per HPI  PMH:  The following were reviewed and entered/updated in epic: Past Medical History:  Diagnosis Date  . Allergic rhinitis   . Arthritis   . CAD (coronary artery disease)    a. 11/11/15 - OOH VF arrest 2/2 ant STEMI >> CPR//AED shock x 2 >> ROSC >> LHC: pLAD 100 (Promus DES), mLCx 50, mRCA 30, EF 25% [managed with hypothermia protocol]  . Fractured rib   . GERD (gastroesophageal reflux disease)   . History of Clostridium difficile colitis 10/2015   post MI >> required IV Flagyl and IV Vanc >> DC on po Vanc  . History of kidney stones   . History of lithotripsy   . HLD (hyperlipidemia)   . Hypertension   . Impaired glucose tolerance   . Insomnia   . Ischemic cardiomyopathy    a. EF at [facility] 11/11/15 25%;  b. Echo 11/14/15: EF 50-55%, dist ant and anterior septal HK  . Nephrolithiasis 1968  .  Pelvic fracture ([place]) 09/2015  . Pre-diabetes   . Rheumatoid arthritis(714.0) 1998  . ST elevation (STEMI) myocardial infarction involving left anterior descending coronary artery ([place]) 11/11/15   100% LAD, Cardiogenic Shock. -- EF ~20-25%   Patient Active Problem List   Diagnosis Date Noted  . Nail deformity 06/29/2018  . Abdominal bloating 06/29/2018  . DM (diabetes mellitus), type 2 with peripheral vascular complications ([place]) 16/09/9603  . Hyperlipidemia due to type 2 diabetes mellitus ([place]) 12/22/2015  . CAD S/P PCI OF pLAD with 3.5 mm x38 mm Promus DES (3.75 mm) 11/12/2015  . Cardiomyopathy, ischemic - EF 20-25% by LV Gram 11/12/2015  . ST elevation (STEMI) myocardial infarction involving left anterior descending coronary artery ([place]) 11/11/2015  . Hypertension associated with diabetes ([place]) 03/24/2010  . TOBACCO USE, QUIT 09/23/2009  . Allergic rhinitis 12/18/2008  . Rheumatoid arthritis ([place]) 12/18/2008  . INSOMNIA 12/18/2008  . NEPHROLITHIASIS, HX OF 12/18/2008   Past Surgical History:  Procedure Laterality Date  . CARDIAC CATHETERIZATION N/A 11/11/2015   Procedure: Left Heart Cath and Cors/Grafts Angiography;  Surgeon: Sherren Mocha, MD;  Location: [place] [facility];  Service: Cardiovascular;  Laterality: N/A;  . CARDIAC CATHETERIZATION N/A 11/11/2015   Procedure: Coronary Stent Intervention;  Surgeon: Sherren Mocha, MD;  Location: [place] [facility];  Service: Cardiovascular;  Laterality: N/A;  prox lad 3.5x38 promus  . CORONARY ANGIOPLASTY WITH STENT PLACEMENT  2016  .  EXTRACORPOREAL SHOCK WAVE LITHOTRIPSY Left 08/29/2017   Procedure: LEFT EXTRACORPOREAL SHOCK WAVE LITHOTRIPSY (ESWL);  Surgeon: Raynelle Bring, MD;  Location: [facility];  Service: Urology;  Laterality: Left;  . status post multiple lithotripy and urological surgery for reccurent calcium oxalate stones      Family History  Problem Relation Age of Onset  . Cancer Mother        pancreatic ; pulmonary  embolism   . Diabetes Father     Medications- reviewed and updated Current Outpatient Medications  Medication Sig Dispense Refill  . aspirin EC 81 MG tablet Take 81 mg by mouth daily.    Marland Kitchen atorvastatin (LIPITOR) 80 MG tablet TAKE 1 TABLET BY MOUTH  DAILY AT 6PM. Please keep upcoming appt for future refills. Thank you 30 tablet 0  . cetirizine (ZYRTEC) 10 MG tablet Take 10 mg by mouth daily.    . Cholecalciferol (VITAMIN D-3) 1000 UNITS CAPS Take 5,000 Units by mouth daily.     . famotidine (PEPCID) 20 MG tablet Take 1 tablet (20 mg total) by mouth 2 (two) times daily. 180 tablet 0  . finasteride (PROSCAR) 5 MG tablet Take 5 mg by mouth daily.    . fluticasone (FLONASE) 50 MCG/ACT nasal spray USE 2 SPRAYS IN EACH  NOSTRIL DAILY 48 g 3  . folic acid (FOLVITE) 1 MG tablet Take 1 mg by mouth daily.      Marland Kitchen HYDROcodone-acetaminophen (NORCO/VICODIN) 5-325 MG tablet Take 1-2 tablets by mouth every 6 (six) hours as needed for moderate pain. 60 tablet 0  . meloxicam (MOBIC) 7.5 MG tablet Take 7.5 mg by mouth 2 (two) times daily.    . metFORMIN (GLUCOPHAGE) 500 MG tablet Take 1 tablet (500 mg total) by mouth 2 (two) times daily with a meal. 180 tablet 2  . methotrexate (RHEUMATREX) 2.5 MG tablet Take 15 mg by mouth once a week. Caution:Chemotherapy. Protect from light.    . metoprolol tartrate (LOPRESSOR) 25 MG tablet TAKE ONE-HALF TABLET BY  MOUTH TWO TIMES DAILY 90 tablet 1  . nitroGLYCERIN (NITROSTAT) 0.4 MG SL tablet Place 1 tablet under the tongue as needed. Every 5 minutes for chest pain  3  . Probiotic Product (PRO-BIOTIC BLEND PO) Take 1 tablet by mouth daily.    . tamsulosin (FLOMAX) 0.4 MG CAPS capsule TAKE 1 CAPSULE BY MOUTH  DAILY 90 capsule 2  . traMADol (ULTRAM) 50 MG tablet Take 50 mg by mouth every 6 (six) hours as needed for moderate pain.    . traZODone (DESYREL) 50 MG tablet TAKE 1 TABLET BY MOUTH AT  BEDTIME 90 tablet 2   No current facility-administered medications for this visit.      Allergies-reviewed and updated Allergies  Allergen Reactions  . Penicillins Anaphylaxis  . Contrast Media [Iodinated Diagnostic Agents] Hives    03/28/14 pt received 1 hour emergent prep and pt did good and had no complaints after scan when given IV benadryl first. Had reaction 1 time, tolerated fine in 03/2014 with Benadryl  . Sulfamethoxazole Hives    Childhood reaction  . Ibuprofen Swelling    Happened 1 time    Social History   Socioeconomic History  . Marital status: Married    Spouse name: Not on file  . Number of children: Not on file  . Years of education: Not on file  . Highest education level: Not on file  Occupational History  . Not on file  Social Needs  . Financial resource strain: Not on  file  . Food insecurity:    Worry: Not on file    Inability: Not on file  . Transportation needs:    Medical: Not on file    Non-medical: Not on file  Tobacco Use  . 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 Smokeless tobacco: Never Used  . Tobacco comment: quit 30 yrs ago  Substance and Sexual Activity  . Alcohol use: No  . Drug use: No  . Sexual activity: Not on file  Lifestyle  . Physical activity:    Days per week: Not on file    Minutes per session: Not on file  . Stress: Not on file  Relationships  . Social connections:    Talks on phone: Not on file    Gets together: Not on file    Attends religious service: Not on file    Active member of club or organization: Not on file    Attends meetings of clubs or organizations: Not on file    Relationship status: Not on file  Other Topics Concern  . Not on file  Social History Narrative  . Not on file     Objective:  Physical Exam: BP 126/74 (BP Location: Left Arm, Patient Position: Sitting, Cuff Size: Normal)   Pulse 75   Temp 98.3 F (36.8 C) (Oral)   Ht 5\' 10"  (1.778 m)   Wt 178 lb (80.7 kg)   SpO2 96%   BMI 25.54 kg/m   Gen: NAD, resting comfortably CV: RRR with no murmurs appreciated Pulm: NWOB,  CTAB with no crackles, wheezes, or rhonchi GI: Normal bowel sounds present. Soft, Nontender, Nondistended. MSK: Right great toe with deformed nail.  Ingrown appearance bilateral nail fold edges.  Assessment/Plan:  Nail deformity Will need toenail removal.  Placed referral to podiatry for further evaluation/management  Hypertension associated with diabetes ([place]) At goal.  Continue metoprolol 12.5 mg twice daily.  Check CBC and CMET.  Hyperlipidemia due to type 2 diabetes mellitus ([name]) Continue Lipitor 80 mg daily.  Check lipid panel today.  DM (diabetes mellitus), type 2 with peripheral vascular complications ([name]) Continue metformin 500 mg twice daily.  Check A1c today.  Rheumatoid arthritis ([name]) Stable on DMARDs and anti-inflammatories.  Defer further management to rheumatology.  Abdominal bloating No red flag signs or symptoms.  Likely secondary to food intolerance.  Continue with watchful waiting.  Recommended simethicone as needed.  Discussed reasons to return to care.  Time Spent: I spent >40 minutes face-to-face with the patient, with more than half spent on counseling for management plan for his no deformity, hypertension, hyperlipidemia, diabetes, and abdominal bloating.   Algis Greenhouse. Jerline Pain, MD 06/29/2018 12:08 PM

## 2018-06-29 NOTE — Assessment & Plan Note (Signed)
Will need toenail removal.  Placed referral to podiatry for further evaluation/management

## 2018-06-29 NOTE — Assessment & Plan Note (Signed)
Continue Lipitor 80 mg daily.  Check lipid panel today. 

## 2018-06-29 NOTE — Assessment & Plan Note (Signed)
Continue metformin 500 mg twice daily.  Check A1c today.

## 2018-06-29 NOTE — Assessment & Plan Note (Signed)
Stable on DMARDs and anti-inflammatories.  Defer further management to rheumatology.

## 2018-06-29 NOTE — Patient Instructions (Signed)
It was very nice to see you today!  I will place a referral to podiatrist today.  We will check blood work today.  Otherwise keep up the good work.  I will see you back in 6 to 12 months, or sooner as needed.  Take care, Dr Jerline Pain

## 2018-06-29 NOTE — Assessment & Plan Note (Signed)
No red flag signs or symptoms.  Likely secondary to food intolerance.  Continue with watchful waiting.  Recommended simethicone as needed.  Discussed reasons to return to care.

## 2018-06-29 NOTE — Assessment & Plan Note (Addendum)
At goal.  Continue metoprolol 12.5 mg twice daily.  Check CBC and CMET.

## 2018-06-30 ENCOUNTER — Encounter: Payer: Self-pay | Admitting: Cardiology

## 2018-06-30 ENCOUNTER — Encounter: Payer: Self-pay | Admitting: Family Medicine

## 2018-06-30 NOTE — Progress Notes (Signed)
Please inform patient of the following:  Blood counts are normal.  Electrolytes, kidney function, and liver function levels are normal. His "bad" cholesterol is a bit low but his other cholesterol levels look good. His A1c is up a little but still at goal.  Do not need to make any medication changes. Would like to see him back in 6 months for follow up.   Algis Greenhouse. Jerline Pain, MD 06/30/2018 10:40 AM

## 2018-07-10 ENCOUNTER — Encounter: Payer: Self-pay | Admitting: Podiatry

## 2018-07-10 ENCOUNTER — Ambulatory Visit (INDEPENDENT_AMBULATORY_CARE_PROVIDER_SITE_OTHER): Payer: 59 | Admitting: Podiatry

## 2018-07-10 VITALS — BP 138/77 | HR 82 | Resp 16

## 2018-07-10 DIAGNOSIS — L600 Ingrowing nail: Secondary | ICD-10-CM

## 2018-07-10 DIAGNOSIS — Z9861 Coronary angioplasty status: Secondary | ICD-10-CM

## 2018-07-10 DIAGNOSIS — I2510 Atherosclerotic heart disease of native coronary artery without angina pectoris: Secondary | ICD-10-CM

## 2018-07-10 NOTE — Patient Instructions (Signed)

## 2018-07-10 NOTE — Progress Notes (Signed)
   Subjective:    Patient ID: [name], male    DOB: [birth year], 72 y.o.   MRN: [id number]  HPI    Review of Systems  All other systems reviewed and are negative.      Objective:   Physical Exam        Assessment & Plan:

## 2018-07-11 NOTE — Progress Notes (Signed)
Subjective:   Patient ID: [name], male   DOB: 72 y.o.   MRN: [id number]   HPI Patient presents with chronic discomfort of the right big toenail medial lateral side with the central portion he works on himself with adrenal and he is able to keep it satisfactorily under control.  Patient does not smoke and likes to be active   Review of Systems  All other systems reviewed and are negative.       Objective:  Physical Exam  Constitutional: He appears well-developed and well-nourished.  Cardiovascular: Intact distal pulses.  Pulmonary/Chest: Effort normal.  Musculoskeletal: Normal range of motion.  Neurological: He is alert.  Skin: Skin is warm.  Nursing note and vitals reviewed.   Neurovascular status intact with patient found to have incurvation of the nail borders of the right hallux both medial lateral side with the center portion being moderately deformed but under control with techniques he uses at home.  Patient is found to have good digital perfusion and is well oriented x3     Assessment:  Chronic deformity of the hallux nail borders right with moderate damage to the center of the nailbed     Plan:  H&P condition reviewed and discussed at great length.  I recommend removal of the nail corners and allowing the center to grow understanding ultimately that may require a permanent type procedure.  Patient wants to have this done and understands risk read consent form and signed.  Today I infiltrated the right hallux 60 mill grams like Marcaine mixture remove the medial lateral spicules and the entire nail did come off with it but I am not applying chemical to the center and just applied to the borders and applied sterile dressing.  Gave instructions on soaks reappoint and encouraged to call with any questions or concerns he may have

## 2018-07-15 ENCOUNTER — Other Ambulatory Visit: Payer: Self-pay | Admitting: Cardiovascular Disease

## 2018-07-18 DIAGNOSIS — N200 Calculus of kidney: Secondary | ICD-10-CM | POA: Diagnosis not present

## 2018-07-21 ENCOUNTER — Other Ambulatory Visit: Payer: Self-pay | Admitting: Internal Medicine

## 2018-07-24 NOTE — Telephone Encounter (Signed)
Patient need to schedule an ov for more refills. 

## 2018-07-31 ENCOUNTER — Other Ambulatory Visit: Payer: Self-pay

## 2018-07-31 MED ORDER — FLUTICASONE PROPIONATE 50 MCG/ACT NA SUSP: 2.0000 | Freq: Every day | NASAL | 3 refills | Status: DC

## 2018-08-03 ENCOUNTER — Other Ambulatory Visit: Payer: Self-pay | Admitting: *Deleted

## 2018-08-03 DIAGNOSIS — H40013 Open angle with borderline findings, low risk, bilateral: Secondary | ICD-10-CM | POA: Diagnosis not present

## 2018-08-03 DIAGNOSIS — E119 Type 2 diabetes mellitus without complications: Secondary | ICD-10-CM | POA: Diagnosis not present

## 2018-08-03 LAB — HM DIABETES EYE EXAM

## 2018-08-03 MED ORDER — ATORVASTATIN CALCIUM 80 MG PO TABS: ORAL_TABLET | ORAL | 2 refills | Status: DC

## 2018-08-16 ENCOUNTER — Encounter: Payer: Self-pay | Admitting: Physical Therapy

## 2018-09-18 ENCOUNTER — Telehealth: Payer: Self-pay | Admitting: Family Medicine

## 2018-09-18 DIAGNOSIS — Z23 Encounter for immunization: Secondary | ICD-10-CM | POA: Diagnosis not present

## 2018-09-18 NOTE — Telephone Encounter (Signed)
See note  Copied from [place] [id number]. Topic: Quick Communication - Other Results >> Sep 18, 2018  3:01 PM Gardiner Ramus wrote: Pt wife called and stated that he has had flu shot. FYI

## 2018-11-03 DIAGNOSIS — Z6826 Body mass index (BMI) 26.0-26.9, adult: Secondary | ICD-10-CM | POA: Diagnosis not present

## 2018-11-03 DIAGNOSIS — M0589 Other rheumatoid arthritis with rheumatoid factor of multiple sites: Secondary | ICD-10-CM | POA: Diagnosis not present

## 2018-11-03 DIAGNOSIS — M25511 Pain in right shoulder: Secondary | ICD-10-CM | POA: Diagnosis not present

## 2018-11-03 DIAGNOSIS — M150 Primary generalized (osteo)arthritis: Secondary | ICD-10-CM | POA: Diagnosis not present

## 2018-11-03 DIAGNOSIS — E663 Overweight: Secondary | ICD-10-CM | POA: Diagnosis not present

## 2018-11-08 ENCOUNTER — Other Ambulatory Visit: Payer: Self-pay | Admitting: Internal Medicine

## 2018-12-27 ENCOUNTER — Other Ambulatory Visit: Payer: Self-pay | Admitting: Family Medicine

## 2018-12-27 MED ORDER — TRAZODONE HCL 50 MG PO TABS: 50.0000 mg | ORAL_TABLET | Freq: Every day | ORAL | 2 refills | Status: DC

## 2018-12-27 NOTE — Telephone Encounter (Signed)
Copied from [place] [id number]. Topic: Quick Communication - Rx Refill/Question >> Dec 27, 2018 10:21 AM Antonieta Iba C wrote: Medication: traZODone (DESYREL) 50 MG tablet - 90 day supply. -   Has the patient contacted their pharmacy? Yes- pt says that pharmacy has sent several request for refill for pt.   (Agent: If no, request that the patient contact the pharmacy for the refill.) (Agent: If yes, when and what did the pharmacy advise?)  Preferred Pharmacy (with phone number or street name): [place], [place]  Agent: Please be advised that RX refills may take up to 3 business days. We ask that you follow-up with your pharmacy.

## 2018-12-27 NOTE — Telephone Encounter (Signed)
See note

## 2018-12-27 NOTE — Telephone Encounter (Signed)
Requested medication (s) are due for refill today -yes  Requested medication (s) are on the active medication list -yes  Future visit scheduled -yes  Last refill: 02/06/18 2 RF  Notes to clinic: Patient was former patient of Dr Burnice Logan- he has appointment in 5 days. Medication sent for provider review.  Requested Prescriptions  Pending Prescriptions Disp Refills   traZODone (DESYREL) 50 MG tablet 90 tablet 2    Sig: Take 1 tablet (50 mg total) by mouth at bedtime.     Psychiatry: Antidepressants - Serotonin Modulator Failed - 12/27/2018 10:43 AM      Failed - Valid encounter within last 6 months    Recent Outpatient Visits          6 months ago Nail deformity   [facility], Algis Greenhouse, MD   1 year ago Encounter for preventive health examination   [facility], Doretha Sou, MD   1 year ago Essential hypertension   [facility], Doretha Sou, MD   2 years ago Essential hypertension   [facility], Doretha Sou, MD   2 years ago Essential hypertension   [facility], Doretha Sou, MD      Future Appointments            In 5 days Vivi Barrack, MD [place], [place]   In 6 months Jerline Pain, Algis Greenhouse, MD [place], [facility]            Requested Prescriptions  Pending Prescriptions Disp Refills   traZODone (DESYREL) 50 MG tablet 90 tablet 2    Sig: Take 1 tablet (50 mg total) by mouth at bedtime.     Psychiatry: Antidepressants - Serotonin Modulator Failed - 12/27/2018 10:43 AM      Failed - Valid encounter within last 6 months    Recent Outpatient Visits          6 months ago Nail deformity   [facility], Algis Greenhouse, MD   1 year ago Encounter for preventive health examination   Therapist, [facility] at [facility], Doretha Sou, MD   1 year ago Essential  hypertension   Therap[facility] at [facility], Doretha Sou, MD   2 years ago Essential hypertension   Therapist, [facility] at [facility], Doretha Sou, MD   2 years ago Essential hypertension   Therapist, music at [facility], Doretha Sou, MD      Future Appointments            In 5 days Vivi Barrack, MD [place], [place]   In 6 months Jerline Pain, Algis Greenhouse, MD [place], [facility]

## 2018-12-27 NOTE — Telephone Encounter (Signed)
Please advise.  Not previously prescribed by you. 

## 2019-01-01 ENCOUNTER — Ambulatory Visit (INDEPENDENT_AMBULATORY_CARE_PROVIDER_SITE_OTHER): Payer: 59 | Admitting: Family Medicine

## 2019-01-01 ENCOUNTER — Encounter: Payer: Self-pay | Admitting: Family Medicine

## 2019-01-01 VITALS — BP 132/74 | HR 81 | Temp 98.4°F | Ht 70.0 in | Wt 183.4 lb

## 2019-01-01 DIAGNOSIS — E1169 Type 2 diabetes mellitus with other specified complication: Secondary | ICD-10-CM | POA: Diagnosis not present

## 2019-01-01 DIAGNOSIS — E785 Hyperlipidemia, unspecified: Secondary | ICD-10-CM | POA: Diagnosis not present

## 2019-01-01 DIAGNOSIS — I10 Essential (primary) hypertension: Secondary | ICD-10-CM | POA: Diagnosis not present

## 2019-01-01 DIAGNOSIS — E1159 Type 2 diabetes mellitus with other circulatory complications: Secondary | ICD-10-CM

## 2019-01-01 DIAGNOSIS — R5383 Other fatigue: Secondary | ICD-10-CM | POA: Diagnosis not present

## 2019-01-01 DIAGNOSIS — E1151 Type 2 diabetes mellitus with diabetic peripheral angiopathy without gangrene: Secondary | ICD-10-CM

## 2019-01-01 DIAGNOSIS — I152 Hypertension secondary to endocrine disorders: Secondary | ICD-10-CM

## 2019-01-01 LAB — POCT GLYCOSYLATED HEMOGLOBIN (HGB A1C): Hemoglobin A1C: 6.5 % — AB (ref 4.0–5.6)

## 2019-01-01 MED ORDER — METFORMIN HCL ER 750 MG PO TB24: 750.0000 mg | ORAL_TABLET | Freq: Every day | ORAL | 1 refills | Status: DC

## 2019-01-01 NOTE — Patient Instructions (Signed)
It was very nice to see you today!  We will change your metformin to the extended release form.  Please take 1 pill once daily.  Let me know if this does not help with your side effects.  I think your fatigue is most likely coming from your heart.  We should get some blood work to check your B12 level and thyroid level to make sure they are okay.  Please try incrementally increasing your exercise level as I think this will help some with your fatigue.  Come back to see me in 6 months, or sooner if needed.  Take care, Dr Jerline Pain

## 2019-01-01 NOTE — Assessment & Plan Note (Addendum)
At goal.  Continue Lopressor 12.5 mg twice daily.

## 2019-01-01 NOTE — Assessment & Plan Note (Signed)
Last LDL 36.  Continue Lipitor 80 mg daily.  Check lipid panel in 6 months with next blood draw.

## 2019-01-01 NOTE — Assessment & Plan Note (Signed)
No red flags.  Likely multifactorial including ischemic heart disease with reduced ejection fraction.  He will be following up with his rheumatologist in a couple weeks to get blood work done.  Will need TSH and B12 levels now.

## 2019-01-01 NOTE — Assessment & Plan Note (Signed)
A1c stable at 6.5.  We will change his metformin to 750 mg extended release once daily to see if this helps with the GI side effect.Marland Kitchen  He will follow-up with me in 3 to 6 months to recheck A1c.  Discussed lifestyle modifications.

## 2019-01-01 NOTE — Progress Notes (Signed)
   Subjective:  [name] is a 73 y.o. male who presents today with a chief complaint of T2DM.   HPI:  Fatigue New problem.  Patient has had some fatigue that has worsened over the last several weeks.  He notes that he will occasionally nod off in the middle of the afternoon.  Thinks that he is overall sleeping okay.  No snoring.  Medication changes.  No fevers or chills.  No weight loss.  His stable, chronic medical conditions are outlined below:  # T2DM - Currently on metformin 500mg  twice daily. Having occasional bloating/flatuence.  - No polyuria or polydipsia  # HLD -On Lipitor 80 mg daily.  Tolerating well without side effects.  # HTN - Currently on lopressor 12.5 mg twice daily and tolerating well without side effects. -No chest pain or shortness of breath.  ROS: Per HPI  PMH: He reports that he has quit smoking. He has never used smokeless tobacco. He reports that he does not drink alcohol or use drugs.  Objective:  Physical Exam: BP 132/74 (BP Location: Left Arm, Patient Position: Sitting, Cuff Size: Normal)   Pulse 81   Temp 98.4 F (36.9 C) (Oral)   Ht 5\' 10"  (1.778 m)   Wt 183 lb 6.4 oz (83.2 kg)   SpO2 96%   BMI 26.32 kg/m   Wt Readings from Last 3 Encounters:  01/01/19 183 lb 6.4 oz (83.2 kg)  06/29/18 178 lb (80.7 kg)  06/01/18 182 lb 12.8 oz (82.9 kg)  Gen: NAD, resting comfortably CV: RRR with no murmurs appreciated Pulm: NWOB, CTAB with no crackles, wheezes, or rhonchi GI: Normal bowel sounds present. Soft, Nontender, Nondistended. MSK: No edema, cyanosis, or clubbing noted Skin: Warm, dry Neuro: Grossly normal, moves all extremities Psych: Normal affect and thought content  Results for orders placed or performed in visit on 01/01/19 (from the past 24 hour(s))  POCT glycosylated hemoglobin (Hb A1C)     Status: Abnormal   Collection Time: 01/01/19 10:42 AM  Result Value Ref Range   Hemoglobin A1C 6.5 (A) 4.0 - 5.6 %      Assessment/Plan:  Fatigue No red flags.  Likely multifactorial including ischemic heart disease with reduced ejection fraction.  He will be following up with his rheumatologist in a couple weeks to get blood work done.  Will need TSH and B12 levels now.  Hypertension associated with diabetes ([place]) At goal.  Continue Lopressor 12.5 mg twice daily.  Hyperlipidemia due to type 2 diabetes mellitus ([name]) Last LDL 36.  Continue Lipitor 80 mg daily.  Check lipid panel in 6 months with next blood draw.  DM (diabetes mellitus), type 2 with peripheral vascular complications ([name]) F0Y stable at 6.5.  We will change his metformin to 750 mg extended release once daily to see if this helps with the GI side effect.Marland Kitchen  He will follow-up with me in 3 to 6 months to recheck A1c.  Discussed lifestyle modifications.   Algis Greenhouse. Jerline Pain, MD 01/01/2019 1:03 PM

## 2019-02-05 DIAGNOSIS — M0589 Other rheumatoid arthritis with rheumatoid factor of multiple sites: Secondary | ICD-10-CM | POA: Diagnosis not present

## 2019-02-06 DIAGNOSIS — H40013 Open angle with borderline findings, low risk, bilateral: Secondary | ICD-10-CM | POA: Diagnosis not present

## 2019-02-07 ENCOUNTER — Encounter: Payer: Self-pay | Admitting: Family Medicine

## 2019-02-08 ENCOUNTER — Other Ambulatory Visit: Payer: Self-pay

## 2019-02-08 MED ORDER — METFORMIN HCL ER 750 MG PO TB24: 750.0000 mg | ORAL_TABLET | Freq: Two times a day (BID) | ORAL | 1 refills | Status: DC

## 2019-03-20 ENCOUNTER — Other Ambulatory Visit: Payer: Self-pay

## 2019-03-20 MED ORDER — TAMSULOSIN HCL 0.4 MG PO CAPS: 0.4000 mg | ORAL_CAPSULE | Freq: Every day | ORAL | 2 refills | Status: DC

## 2019-04-20 ENCOUNTER — Other Ambulatory Visit: Payer: Self-pay | Admitting: Cardiovascular Disease

## 2019-05-08 DIAGNOSIS — M0589 Other rheumatoid arthritis with rheumatoid factor of multiple sites: Secondary | ICD-10-CM | POA: Diagnosis not present

## 2019-06-01 ENCOUNTER — Ambulatory Visit: Payer: 59 | Admitting: Physician Assistant

## 2019-07-02 ENCOUNTER — Telehealth: Payer: Self-pay | Admitting: Physician Assistant

## 2019-07-02 ENCOUNTER — Encounter: Payer: 59 | Admitting: Family Medicine

## 2019-07-02 NOTE — Telephone Encounter (Signed)

## 2019-07-03 ENCOUNTER — Other Ambulatory Visit: Payer: Self-pay

## 2019-07-03 ENCOUNTER — Encounter: Payer: Self-pay | Admitting: Physician Assistant

## 2019-07-03 ENCOUNTER — Ambulatory Visit (INDEPENDENT_AMBULATORY_CARE_PROVIDER_SITE_OTHER): Payer: 59 | Admitting: Family

## 2019-07-03 VITALS — BP 122/64 | HR 79 | Ht 70.0 in | Wt 181.0 lb

## 2019-07-03 DIAGNOSIS — I2510 Atherosclerotic heart disease of native coronary artery without angina pectoris: Secondary | ICD-10-CM

## 2019-07-03 DIAGNOSIS — I10 Essential (primary) hypertension: Secondary | ICD-10-CM

## 2019-07-03 DIAGNOSIS — E785 Hyperlipidemia, unspecified: Secondary | ICD-10-CM | POA: Diagnosis not present

## 2019-07-03 DIAGNOSIS — Z9861 Coronary angioplasty status: Secondary | ICD-10-CM | POA: Diagnosis not present

## 2019-07-03 LAB — LIPID PANEL
Chol/HDL Ratio: 3.1 ratio (ref 0.0–5.0)
Cholesterol, Total: 114 mg/dL (ref 100–199)
HDL: 37 mg/dL — ABNORMAL LOW (ref >39)
LDL Calculated: 39 mg/dL (ref 0–99)
Triglycerides: 190 mg/dL — ABNORMAL HIGH (ref 0–149)
VLDL Cholesterol Cal: 38 mg/dL (ref 5–40)

## 2019-07-03 MED ORDER — METOPROLOL TARTRATE 25 MG PO TABS: 12.5000 mg | ORAL_TABLET | Freq: Two times a day (BID) | ORAL | 3 refills | Status: DC

## 2019-07-03 MED ORDER — ATORVASTATIN CALCIUM 80 MG PO TABS: 80.0000 mg | ORAL_TABLET | Freq: Every day | ORAL | 3 refills | Status: DC

## 2019-07-03 NOTE — Patient Instructions (Signed)
Medication Instructions:  No changes today.  If you need a refill on your cardiac medications before your next appointment, please call your pharmacy.   Lab work: Lipid profile today  If you have labs (blood work) drawn today and your tests are completely normal, you will receive your results only by: Marland Kitchen MyChart Message (if you have MyChart) OR . A paper copy in the mail If you have any lab test that is abnormal or we need to change your treatment, we will call you to review the results.  Testing/Procedures: None ordered today.   Follow-Up: At [facility], you and your health needs are our priority.  As part of our continuing mission to provide you with exceptional heart care, we have created designated Provider Care Teams.  These Care Teams include your primary Cardiologist (physician) and Advanced Practice Providers (APPs -  Physician Assistants and Nurse Practitioners) who all work together to provide you with the care you need, when you need it. You will need a follow up appointment in:  6 months.  Please call our office 2 months in advance to schedule this appointment.  You may see Sherren Mocha, MD or one of the following Advanced Practice Providers on your designated Care Team: Richardson Dopp, PA-C [place], [place] . Daune Perch, NP  Any Other Special Instructions Will Be Listed Below (If Applicable). Keep up the good work with diet and exercise!

## 2019-07-03 NOTE — Progress Notes (Signed)
Cardiology Office Note:    Date:  07/03/2019   ID:  [name], DOB 02/24/1946, MRN [id number]  PCP:  Vivi Barrack, MD  Cardiologist:  Sherren Mocha, MD  Electrophysiologist:  None   Referring MD: Vivi Barrack, MD   Chief Complaint: 73 yo male presents for 6 month follow up of CAD.   History of Present Illness:    [name] is a 73 y.o. male with a hx of CAD, DM2, HLD. His CAD is s/p anterior STEMI (out of hospital arrest) due to total occlusion of proximal LAD treated with DES, initially severe LV dysfunction, improved with revascularization. Last seen by Pecolia Ades, NP 06/01/18.   Symptoms prior to MI were mild shortness of breath once and indigestion about a month beforehand then no symptoms until MI.   He reports no concerns regarding his heart. Denies chest pain, palpitations, SOB. He exercises by biking or kayaking. Tracks his steps daily. Endorses eating a heart healthy diet. 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   He was a bit confused as a provider at an another office had mentioned his "stents", plural. Reviewed his cardiac cath report with him to show the single stent.   He continues to follow closely with his PCP and rheumatologist for RA. Recent labs at his rheumatologist reviewed to show normal kidney and liver function, electrolytes normal, normal Hb. See full lab report below.   Past Medical History:  Diagnosis Date  . Allergic rhinitis   . Arthritis   . CAD (coronary artery disease)    a. 11/11/15 - OOH VF arrest 2/2 ant STEMI >> CPR//AED shock x 2 >> ROSC >> LHC: pLAD 100 (Promus DES), mLCx 50, mRCA 30, EF 25% [managed with hypothermia protocol]  . Fractured rib   . GERD (gastroesophageal reflux disease)   . History of Clostridium difficile colitis 10/2015   post MI >> required IV Flagyl and IV Vanc >> DC on po Vanc  . History of kidney stones   . History of lithotripsy   . HLD  (hyperlipidemia)   . Hypertension   . Impaired glucose tolerance   . Insomnia   . Ischemic cardiomyopathy    a. EF at [facility] 11/11/15 25%;  b. Echo 11/14/15: EF 50-55%, dist ant and anterior septal HK  . Nephrolithiasis 1968  . Pelvic fracture ([place]) 09/2015  . Pre-diabetes   . Rheumatoid arthritis(714.0) 1998  . ST elevation (STEMI) myocardial infarction involving left anterior descending coronary artery ([place]) 11/11/15   100% LAD, Cardiogenic Shock. -- EF ~20-25%    Past Surgical History:  Procedure Laterality Date  . CARDIAC CATHETERIZATION N/A 11/11/2015   Procedure: Left Heart Cath and Cors/Grafts Angiography;  Surgeon: Sherren Mocha, MD;  Location: [place] [facility];  Service: Cardiovascular;  Laterality: N/A;  . CARDIAC CATHETERIZATION N/A 11/11/2015   Procedure: Coronary Stent Intervention;  Surgeon: Sherren Mocha, MD;  Location: [place] [facility];  Service: Cardiovascular;  Laterality: N/A;  prox lad 3.5x38 promus  . CORONARY ANGIOPLASTY WITH STENT PLACEMENT  2016  . EXTRACORPOREAL SHOCK WAVE LITHOTRIPSY Left 08/29/2017   Procedure: LEFT EXTRACORPOREAL SHOCK WAVE LITHOTRIPSY (ESWL);  Surgeon: Raynelle Bring, MD;  Location: [facility];  Service: Urology;  Laterality: Left;  . status post multiple lithotripy and urological surgery for reccurent calcium oxalate stones      Current Medications: Current Meds  Medication Sig  . aspirin EC 81 MG tablet Take  81 mg by mouth daily.  Marland Kitchen atorvastatin (LIPITOR) 80 MG tablet Take 1 tablet (80 mg total) by mouth daily at 6 PM.  . cetirizine (ZYRTEC) 10 MG tablet Take 10 mg by mouth daily.  . Cholecalciferol (VITAMIN D-3) 1000 UNITS CAPS Take 5,000 Units by mouth daily.   . famotidine (PEPCID) 20 MG tablet Take 1 tablet (20 mg total) by mouth 2 (two) times daily.  . finasteride (PROSCAR) 5 MG tablet Take 5 mg by mouth daily.  . fluticasone (FLONASE) 50 MCG/ACT nasal spray Place 2 sprays into both nostrils daily.  . folic acid (FOLVITE) 1 MG  tablet Take 1 mg by mouth daily.    . meloxicam (MOBIC) 7.5 MG tablet Take 7.5 mg by mouth 2 (two) times daily.  . metFORMIN (GLUCOPHAGE XR) 750 MG 24 hr tablet Take 1 tablet (750 mg total) by mouth 2 (two) times daily.  . methotrexate (RHEUMATREX) 2.5 MG tablet Take 15 mg by mouth once a week. Caution:Chemotherapy. Protect from light.  . metoprolol tartrate (LOPRESSOR) 25 MG tablet Take 0.5 tablets (12.5 mg total) by mouth 2 (two) times daily.  . nitroGLYCERIN (NITROSTAT) 0.4 MG SL tablet Place 1 tablet under the tongue as needed. Every 5 minutes for chest pain  . Probiotic Product (PRO-BIOTIC BLEND PO) Take 1 tablet by mouth daily.  . tamsulosin (FLOMAX) 0.4 MG CAPS capsule Take 1 capsule (0.4 mg total) by mouth daily.  . traMADol (ULTRAM) 50 MG tablet Take 50 mg by mouth every 6 (six) hours as needed for moderate pain.  . traZODone (DESYREL) 50 MG tablet Take 1 tablet (50 mg total) by mouth at bedtime.  . [DISCONTINUED] atorvastatin (LIPITOR) 80 MG tablet TAKE 1 TABLET BY MOUTH  DAILY AT 6PM.  . [DISCONTINUED] metoprolol tartrate (LOPRESSOR) 25 MG tablet TAKE ONE-HALF TABLET BY  MOUTH TWICE A DAY     Allergies:   Penicillins, Contrast media [iodinated diagnostic agents], Sulfamethoxazole, and Ibuprofen   Social History   Socioeconomic History  . Marital status: Married    Spouse name: Not on file  . Number of children: Not on file  . Years of education: Not on file  . Highest education level: Not on file  Occupational History  . Not on file  Social Needs  . Financial resource strain: Not on file  . Food insecurity    Worry: Not on file    Inability: Not on file  . Transportation needs    Medical: Not on file    Non-medical: Not on file  Tobacco Use  . 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 Smokeless tobacco: Never Used  . Tobacco comment: quit 30 yrs ago  Substance and Sexual Activity  . Alcohol use: No  . Drug use: No  . Sexual activity: Not on file  Lifestyle  . Physical  activity    Days per week: Not on file    Minutes per session: Not on file  . Stress: Not on file  Relationships  . Social Herbalist on phone: Not on file    Gets together: Not on file    Attends religious service: Not on file    Active member of club or organization: Not on file    Attends meetings of clubs or organizations: Not on file    Relationship status: Not on file  Other Topics Concern  . Not on file  Social History Narrative  . Not on file    Family History: The patient's family  history includes Cancer in his mother; Diabetes in his father.  ROS:   Please see the history of present illness.    Review of Systems  Constitution: Negative for chills, fever and malaise/fatigue.  Cardiovascular: Negative for chest pain, dyspnea on exertion, leg swelling and palpitations.  Respiratory: Negative for cough, shortness of breath and wheezing.   Neurological: Negative for dizziness, light-headedness and weakness.   All other systems reviewed and are negative.  EKGs/Labs/Other Studies Reviewed:    The following studies were reviewed today: Cardiac Cath 11/11/15 Left Anterior Descending  Prox LAD lesion 100% stenosed  Thrombotic.  Left Circumflex  Mid Cx lesion 50% stenosed  Mid Cx lesion.  Right Coronary Artery  Mid RCA lesion 30% stenosed  Mid RCA lesion.  Intervention  Prox LAD lesion  PCI  There is no pre-interventional antegrade distal flow (TIMI 0). Pre-stent angioplasty was performed. A drug-eluting stent was placed. Post-stent angioplasty was performed. The post-interventional distal flow is normal (TIMI 3). The intervention was successful. No complications occurred at this lesion. The proximal LAD is 100% occluded with TIMI-0 flow. Heparin and aggrastat are used for systemic anticoagulation. Brilinta 180 mg is administered via the OG tube. An XB-LAD guide is used and a Buyer, retail is used to cross the occlusion. The LAD is treated with a 2.5x12 mm  balloon on multiple inflations. Angiography shows severe diffuse proximal LAD stenosis post-angioplasty. The vessel is stented with a 3.5x38 mm Promus DES and post-dilated with a 3.75 mm Balta balloon.  There is a 0% residual stenosis post intervention.       Post-Intervention Diagram           Echo 11/14/15 Study Conclusions   - Left ventricle: The cavity size was normal. Systolic function was   normal. The estimated ejection fraction was in the range of 50%   to 55%. There may be hypokinesis of the distal anterior wall and   anterior septum. The study is not technically sufficient to allow   evaluation of LV diastolic function.  EKG:  EKG is ordered today.  The ekg ordered today demonstrates sinus rhythm nonspecific ST/T wave changes stable when compared to previous.   Recent Labs: 01/01/19 A1C 6.5 05/08/19 via [facility]: ALT 21, AST 15, creatinine 1.09, GFR 67, Hb 16.6   Recent Lipid Panel    Component Value Date/Time   CHOL 111 06/29/2018 1153   TRIG 214.0 (H) 06/29/2018 1153   HDL 33.30 (L) 06/29/2018 1153   CHOLHDL 3 06/29/2018 1153   VLDL 42.8 (H) 06/29/2018 1153   LDLCALC 36 07/27/2017 1024   LDLDIRECT 62.0 06/29/2018 1153    Physical Exam:    VS:  BP 122/64   Pulse 79   Ht 5\' 10"  (1.778 m)   Wt 181 lb (82.1 kg)   SpO2 97%   BMI 25.97 kg/m     Wt Readings from Last 3 Encounters:  07/03/19 181 lb (82.1 kg)  01/01/19 183 lb 6.4 oz (83.2 kg)  06/29/18 178 lb (80.7 kg)     GEN:  Well nourished, well developed in no acute distress HEENT: Normal NECK: No JVD; No carotid bruits LYMPHATICS: No lymphadenopathy CARDIAC: RRR, no murmurs, rubs, gallops RESPIRATORY:   Clear to auscultation without rales, wheezing or rhonchi  ABDOMEN: Soft, non-tender, non-distended MUSCULOSKELETAL:  No  edema; No deformity  SKIN: Warm and dry NEUROLOGIC:  Alert and oriented x 3 PSYCHIATRIC:  Normal affect   ASSESSMENT:    1. CAD S/P PCI OF pLAD  with 3.5 mm x38 mm Promus DES (3.75  mm)   2. Essential hypertension   3. Hyperlipidemia LDL goal <70    PLAN:    In order of problems listed above:  1. CAD s/p PCI LAD - Stable, no anginal symptoms. GDMT asa, beta blocker, statin. Continue regular exercise and heart healthy diet.  2. HTN - Well controlled today. Continue present anti-hypertensive regimen.  3. HLD, LDL goal <70 - Last lipid profile 06/2018, will obtain today. Continue Atorvastatin.  4. DM2 - Managed by his PCP. A1c 6.5 in January. If additional agent needed, consider SGLT2 such as Jardiance for cardioprotective benefit.    Medication Adjustments/Labs and Tests Ordered: Current medicines are reviewed at length with the patient today.  Concerns regarding medicines are outlined above.  Orders Placed This Encounter  Procedures  . Lipid Profile  . EKG 12-Lead   Meds ordered this encounter  Medications  . atorvastatin (LIPITOR) 80 MG tablet    Sig: Take 1 tablet (80 mg total) by mouth daily at 6 PM.    Dispense:  90 tablet    Refill:  3  . metoprolol tartrate (LOPRESSOR) 25 MG tablet    Sig: Take 0.5 tablets (12.5 mg total) by mouth 2 (two) times daily.    Dispense:  90 tablet    Refill:  3    Patient Instructions  Medication Instructions:  No changes today.  If you need a refill on your cardiac medications before your next appointment, please call your pharmacy.   Lab work: Lipid profile today  If you have labs (blood work) drawn today and your tests are completely normal, you will receive your results only by: Marland Kitchen MyChart Message (if you have MyChart) OR . A paper copy in the mail If you have any lab test that is abnormal or we need to change your treatment, we will call you to review the results.  Testing/Procedures: None ordered today.   Follow-Up: At [facility], you and your health needs are our priority.  As part of our continuing mission to provide you with exceptional heart care, we have created designated Provider Care Teams.   These Care Teams include your primary Cardiologist (physician) and Advanced Practice Providers (APPs -  Physician Assistants and Nurse Practitioners) who all work together to provide you with the care you need, when you need it. You will need a follow up appointment in:  6 months.  Please call our office 2 months in advance to schedule this appointment.  You may see Sherren Mocha, MD or one of the following Advanced Practice Providers on your designated Care Team: Richardson Dopp, PA-C [place], [place] . Daune Perch, NP  Any Other Special Instructions Will Be Listed Below (If Applicable). Keep up the good work with diet and exercise!      Signed, Loel Dubonnet, NP  07/03/2019 9:50 AM    [place]

## 2019-07-04 ENCOUNTER — Ambulatory Visit: Payer: 59 | Admitting: Physician Assistant

## 2019-07-04 NOTE — Progress Notes (Signed)
LDL at goal of <70! Triglycerides little elevated - likely due to diet. Continue to watch sugar, carbohydrate, fat intake.

## 2019-08-12 ENCOUNTER — Other Ambulatory Visit: Payer: Self-pay | Admitting: Family Medicine

## 2019-08-27 DIAGNOSIS — Z23 Encounter for immunization: Secondary | ICD-10-CM | POA: Diagnosis not present

## 2019-09-25 ENCOUNTER — Other Ambulatory Visit: Payer: Self-pay | Admitting: Family Medicine

## 2019-11-02 ENCOUNTER — Other Ambulatory Visit: Payer: Self-pay | Admitting: Family Medicine

## 2019-12-24 NOTE — Progress Notes (Signed)
Cardiology Office Note    Date:  12/27/2019   ID:  Maximum, Murnan August 29, 1946, MRN [id number]  PCP:  Vivi Barrack, MD  Cardiologist:  Dr. Burt Knack   Chief Complaint: 6 Months follow up  History of Present Illness:   [name] is a 74 y.o. male with a hx of CAD, DM2, HLD and HTN seen for follow up.   Hx of CAD s/p anterior STEMI in 2016 (out of hospital arrest) due to total occlusion of proximal LAD treated with DES, initially severe LV dysfunction, improved with revascularization.  He was doing well when last seen by APP 06/2019.  Here today for follow up.  He does kayaking and biking without any limitation.  On colder days, he walks on treatment.  The patient denies nausea, vomiting, fever, chest pain, palpitations, shortness of breath, orthopnea, PND, dizziness, syncope, cough, congestion, abdominal pain, hematochezia, melena, lower extremity edema.   Past Medical History:  Diagnosis Date  . Allergic rhinitis   . Arthritis   . CAD (coronary artery disease)    a. 11/11/15 - OOH VF arrest 2/2 ant STEMI >> CPR//AED shock x 2 >> ROSC >> LHC: pLAD 100 (Promus DES), mLCx 50, mRCA 30, EF 25% [managed with hypothermia protocol]  . Fractured rib   . GERD (gastroesophageal reflux disease)   . History of Clostridium difficile colitis 10/2015   post MI >> required IV Flagyl and IV Vanc >> DC on po Vanc  . History of kidney stones   . History of lithotripsy   . HLD (hyperlipidemia)   . Hypertension   . Impaired glucose tolerance   . Insomnia   . Ischemic cardiomyopathy    a. EF at [facility] 11/11/15 25%;  b. Echo 11/14/15: EF 50-55%, dist ant and anterior septal HK  . Nephrolithiasis 1968  . Pelvic fracture ([place]) 09/2015  . Pre-diabetes   . Rheumatoid arthritis(714.0) 1998  . ST elevation (STEMI) myocardial infarction involving left anterior descending coronary artery ([place]) 11/11/15   100% LAD, Cardiogenic Shock. -- EF ~20-25%    Past Surgical History:  Procedure  Laterality Date  . CARDIAC CATHETERIZATION N/A 11/11/2015   Procedure: Left Heart Cath and Cors/Grafts Angiography;  Surgeon: Sherren Mocha, MD;  Location: [facility];  Service: Cardiovascular;  Laterality: N/A;  . CARDIAC CATHETERIZATION N/A 11/11/2015   Procedure: Coronary Stent Intervention;  Surgeon: Sherren Mocha, MD;  Location: [facility];  Service: Cardiovascular;  Laterality: N/A;  prox lad 3.5x38 promus  . CORONARY ANGIOPLASTY WITH STENT PLACEMENT  2016  . EXTRACORPOREAL SHOCK WAVE LITHOTRIPSY Left 08/29/2017   Procedure: LEFT EXTRACORPOREAL SHOCK WAVE LITHOTRIPSY (ESWL);  Surgeon: Raynelle Bring, MD;  Location: [facility];  Service: Urology;  Laterality: Left;  . status post multiple lithotripy and urological surgery for reccurent calcium oxalate stones      Current Medications: Prior to Admission medications   Medication Sig Start Date End Date Taking? Authorizing Provider  aspirin EC 81 MG tablet Take 81 mg by mouth daily.    [provider]  atorvastatin (LIPITOR) 80 MG tablet Take 1 tablet (80 mg total) by mouth daily at 6 PM. 07/03/19   Loel Dubonnet, NP  cetirizine (ZYRTEC) 10 MG tablet Take 10 mg by mouth daily.    [provider]  Cholecalciferol (VITAMIN D-3) 1000 UNITS CAPS Take 5,000 Units by mouth daily.     [provider]  famotidine (PEPCID) 20 MG tablet Take 1 tablet (20 mg  total) by mouth 2 (two) times daily. 12/29/15   Sherren Mocha, MD  finasteride (PROSCAR) 5 MG tablet Take 5 mg by mouth daily. 02/22/18   [provider]  fluticasone Asencion Islam) 50 MCG/ACT nasal spray USE 2 SPRAYS IN EACH  NOSTRIL DAILY 09/26/19   Vivi Barrack, MD  folic acid (FOLVITE) 1 MG tablet Take 1 mg by mouth daily.      [provider]  meloxicam (MOBIC) 7.5 MG tablet Take 7.5 mg by mouth 2 (two) times daily. 05/01/18   [provider]  metFORMIN (GLUCOPHAGE-XR) 750 MG 24 hr tablet TAKE 1 TABLET BY MOUTH TWO  TIMES DAILY  08/13/19   Vivi Barrack, MD  methotrexate (RHEUMATREX) 2.5 MG tablet Take 15 mg by mouth once a week. Caution:Chemotherapy. Protect from light.    [provider]  metoprolol tartrate (LOPRESSOR) 25 MG tablet Take 0.5 tablets (12.5 mg total) by mouth 2 (two) times daily. 07/03/19   Loel Dubonnet, NP  nitroGLYCERIN (NITROSTAT) 0.4 MG SL tablet Place 1 tablet under the tongue as needed. Every 5 minutes for chest pain 12/05/15   [provider]  Probiotic Product (PRO-BIOTIC BLEND PO) Take 1 tablet by mouth daily.    [provider]  tamsulosin (FLOMAX) 0.4 MG CAPS capsule TAKE 1 CAPSULE BY MOUTH  DAILY 11/02/19   Vivi Barrack, MD  traMADol (ULTRAM) 50 MG tablet Take 50 mg by mouth every 6 (six) hours as needed for moderate pain.    [provider]  traZODone (DESYREL) 50 MG tablet TAKE 1 TABLET BY MOUTH AT  BEDTIME 08/13/19   Vivi Barrack, MD    Allergies:   Penicillins, Contrast media [iodinated diagnostic agents], Sulfamethoxazole, and Ibuprofen   Social History   Socioeconomic History  . Marital status: Married    Spouse name: Not on file  . Number of children: Not on file  . Years of education: Not on file  . Highest education level: Not on file  Occupational History  . Not on file  Tobacco Use  . 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 Smokeless tobacco: Never Used  . Tobacco comment: quit 30 yrs ago  Substance and Sexual Activity  . Alcohol use: No  . Drug use: No  . Sexual activity: Not on file  Other Topics Concern  . Not on file  Social History Narrative  . Not on file   Social Determinants of Health   Financial Resource Strain:   . Difficulty of Paying Living Expenses: Not on file  Food Insecurity:   . Worried About Charity fundraiser in the Last Year: Not on file  . Ran Out of Food in the Last Year: Not on file  Transportation Needs:   . Lack of Transportation (Medical): Not on file  . Lack of Transportation (Non-Medical):  Not on file  Physical Activity:   . Days of Exercise per Week: Not on file  . Minutes of Exercise per Session: Not on file  Stress:   . Feeling of Stress : Not on file  Social Connections:   . Frequency of Communication with Friends and Family: Not on file  . Frequency of Social Gatherings with Friends and Family: Not on file  . Attends Religious Services: Not on file  . Active Member of Clubs or Organizations: Not on file  . Attends Archivist Meetings: Not on file  . Marital Status: Not on file     Family History:  The  patient's family history includes Cancer in his mother; Diabetes in his father.   ROS:   Please see the history of present illness.    ROS All other systems reviewed and are negative.   PHYSICAL EXAM:   VS:  BP 126/74   Pulse 82   Ht 5\' 10"  (1.778 m)   Wt 183 lb 6.4 oz (83.2 kg)   SpO2 97%   BMI 26.32 kg/m    GEN: Well nourished, well developed, in no acute distress  HEENT: normal  Neck: no JVD, carotid bruits, or masses Cardiac: RRR; no murmurs, rubs, or gallops,no edema  Respiratory:  clear to auscultation bilaterally, normal work of breathing GI: soft, nontender, nondistended, + BS MS: no deformity or atrophy  Skin: warm and dry, no rash Neuro:  Alert and Oriented x 3, Strength and sensation are intact Psych: euthymic mood, full affect  Wt Readings from Last 3 Encounters:  12/27/19 183 lb 6.4 oz (83.2 kg)  07/03/19 181 lb (82.1 kg)  01/01/19 183 lb 6.4 oz (83.2 kg)      Studies/Labs Reviewed:   EKG:  EKG is ordered today.  The ekg ordered today demonstrates NSR at rate of 71 bpm  Recent Labs: No results found for requested labs within last 8760 hours.   Lipid Panel    Component Value Date/Time   CHOL 114 07/03/2019 0950   TRIG 190 (H) 07/03/2019 0950   HDL 37 (L) 07/03/2019 0950   CHOLHDL 3.1 07/03/2019 0950   CHOLHDL 3 06/29/2018 1153   VLDL 42.8 (H) 06/29/2018 1153   LDLCALC 39 07/03/2019 0950   LDLDIRECT 62.0 06/29/2018  1153    Additional studies/ records that were reviewed today include:  Cardiac Cath 11/11/15 Left Anterior Descending  Prox LAD lesion 100% stenosed  Thrombotic.  Left Circumflex  Mid Cx lesion 50% stenosed  Mid Cx lesion.  Right Coronary Artery  Mid RCA lesion 30% stenosed  Mid RCA lesion.  Intervention  Prox LAD lesion  PCI  There is no pre-interventional antegrade distal flow (TIMI 0). Pre-stent angioplasty was performed. A drug-eluting stent was placed. Post-stent angioplasty was performed. The post-interventional distal flow is normal (TIMI 3). The intervention was successful. No complications occurred at this lesion. The proximal LAD is 100% occluded with TIMI-0 flow. Heparin and aggrastat are used for systemic anticoagulation. Brilinta 180 mg is administered via the OG tube. An XB-LAD guide is used and a Buyer, retail is used to cross the occlusion. The LAD is treated with a 2.5x12 mm balloon on multiple inflations. Angiography shows severe diffuse proximal LAD stenosis post-angioplasty. The vessel is stented with a 3.5x38 mm Promus DES and post-dilated with a 3.75 mm [place] balloon.  There is a 0% residual stenosis post intervention.       Post-Intervention Diagram            ASSESSMENT & PLAN:    1. CAD s/p DES to pLAD (otherise 50% mCx and 30% mRCA) No angina. Continue exercise. Continue ASA, statin and BB.   2. HTN - BP stable on current medications.   3. HLD - 07/03/2019: Cholesterol, Total 114; HDL 37; LDL Calculated 39; Triglycerides 190  - Followed by PCP - LDL at goal.  - Continue statin  4. DM - Last A1c 6.5   Medication Adjustments/Labs and Tests Ordered: Current medicines are reviewed at length with the patient today.  Concerns regarding medicines are outlined above.  Medication changes, Labs and Tests ordered today are listed in the  Patient Instructions below. Patient Instructions  Medication Instructions:  Your physician recommends that you  continue on your current medications as directed. Please refer to the Current Medication list given to you today.  *If you need a refill on your cardiac medications before your next appointment, please call your pharmacy*  Lab Work: NONE If you have labs (blood work) drawn today and your tests are completely normal, you will receive your results only by: Marland Kitchen MyChart Message (if you have MyChart) OR . A paper copy in the mail If you have any lab test that is abnormal or we need to change your treatment, we will call you to review the results.  Testing/Procedures: NONE  Follow-Up: At [facility], you and your health needs are our priority.  As part of our continuing mission to provide you with exceptional heart care, we have created designated Provider Care Teams.  These Care Teams include your primary Cardiologist (physician) and Advanced Practice Providers (APPs -  Physician Assistants and Nurse Practitioners) who all work together to provide you with the care you need, when you need it.  Your next appointment:   1 year(s)  The format for your next appointment:   Either In Person or Virtual  Provider:   You may see Sherren Mocha, MD or one of the following Advanced Practice Providers on your designated Care Team:    Richardson Dopp, PA-C  Vin Pryor, PA-C  Daune Perch, NP       [place] Long[place], [place]  12/27/2019 10:06 AM    [address], [place], [place]  [address] Phone: [phone]; Fax: [phone]

## 2019-12-27 ENCOUNTER — Ambulatory Visit (INDEPENDENT_AMBULATORY_CARE_PROVIDER_SITE_OTHER): Payer: 59 | Admitting: Physician Assistant

## 2019-12-27 ENCOUNTER — Other Ambulatory Visit: Payer: Self-pay

## 2019-12-27 ENCOUNTER — Encounter: Payer: Self-pay | Admitting: Physician Assistant

## 2019-12-27 VITALS — BP 126/74 | HR 82 | Ht 70.0 in | Wt 183.4 lb

## 2019-12-27 DIAGNOSIS — I10 Essential (primary) hypertension: Secondary | ICD-10-CM | POA: Diagnosis not present

## 2019-12-27 DIAGNOSIS — Z9861 Coronary angioplasty status: Secondary | ICD-10-CM | POA: Diagnosis not present

## 2019-12-27 DIAGNOSIS — E785 Hyperlipidemia, unspecified: Secondary | ICD-10-CM | POA: Diagnosis not present

## 2019-12-27 DIAGNOSIS — I2510 Atherosclerotic heart disease of native coronary artery without angina pectoris: Secondary | ICD-10-CM

## 2019-12-27 NOTE — Patient Instructions (Signed)
Medication Instructions:  Your physician recommends that you continue on your current medications as directed. Please refer to the Current Medication list given to you today.  *If you need a refill on your cardiac medications before your next appointment, please call your pharmacy*  Lab Work: NONE If you have labs (blood work) drawn today and your tests are completely normal, you will receive your results only by: Marland Kitchen MyChart Message (if you have MyChart) OR . A paper copy in the mail If you have any lab test that is abnormal or we need to change your treatment, we will call you to review the results.  Testing/Procedures: NONE  Follow-Up: At [facility], you and your health needs are our priority.  As part of our continuing mission to provide you with exceptional heart care, we have created designated Provider Care Teams.  These Care Teams include your primary Cardiologist (physician) and Advanced Practice Providers (APPs -  Physician Assistants and Nurse Practitioners) who all work together to provide you with the care you need, when you need it.  Your next appointment:   1 year(s)  The format for your next appointment:   Either In Person or Virtual  Provider:   You may see Sherren Mocha, MD or one of the following Advanced Practice Providers on your designated Care Team:    Richardson Dopp, PA-C  Vin Baxter, [place]  Daune Perch, [place]

## 2020-01-08 ENCOUNTER — Ambulatory Visit: Payer: 59

## 2020-01-17 ENCOUNTER — Ambulatory Visit: Payer: 59 | Attending: Internal Medicine

## 2020-01-17 DIAGNOSIS — Z23 Encounter for immunization: Secondary | ICD-10-CM | POA: Insufficient documentation

## 2020-01-17 NOTE — Progress Notes (Signed)
   Covid-19 Vaccination Clinic  Name:  [name]    MRN: [id number] DOB: 08-22-1946  01/17/2020  [name] was observed post Covid-19 immunization for 15 minutes without incidence. He was provided with Vaccine Information Sheet and instruction to access the V-Safe system.   [name] was instructed to call 911 with any severe reactions post vaccine: Marland Kitchen Difficulty breathing  . Swelling of your face and throat  . A fast heartbeat  . A bad rash all over your body  . Dizziness and weakness    Immunizations Administered    Name Date Dose VIS Date Route   Pfizer COVID-19 Vaccine 01/17/2020  6:00 PM 0.3 mL 11/23/2019 Intramuscular   Manufacturer: [place]   Lot: [id number]   [place]: [id number]

## 2020-01-29 ENCOUNTER — Ambulatory Visit: Payer: 59

## 2020-02-08 DIAGNOSIS — M0589 Other rheumatoid arthritis with rheumatoid factor of multiple sites: Secondary | ICD-10-CM | POA: Diagnosis not present

## 2020-02-08 DIAGNOSIS — M25511 Pain in right shoulder: Secondary | ICD-10-CM | POA: Diagnosis not present

## 2020-02-08 DIAGNOSIS — Z6827 Body mass index (BMI) 27.0-27.9, adult: Secondary | ICD-10-CM | POA: Diagnosis not present

## 2020-02-08 DIAGNOSIS — M150 Primary generalized (osteo)arthritis: Secondary | ICD-10-CM | POA: Diagnosis not present

## 2020-02-08 DIAGNOSIS — E663 Overweight: Secondary | ICD-10-CM | POA: Diagnosis not present

## 2020-02-12 ENCOUNTER — Ambulatory Visit: Payer: 59 | Attending: Internal Medicine

## 2020-02-12 DIAGNOSIS — Z23 Encounter for immunization: Secondary | ICD-10-CM

## 2020-02-12 NOTE — Progress Notes (Signed)
   Covid-19 Vaccination Clinic  Name:  [name]    MRN: [id number] DOB: 08-24-1946  02/12/2020  [name] was observed post Covid-19 immunization for 15 minutes without incident. He was provided with Vaccine Information Sheet and instruction to access the V-Safe system.   [name] was instructed to call 911 with any severe reactions post vaccine: Marland Kitchen Difficulty breathing  . Swelling of face and throat  . A fast heartbeat  . A bad rash all over body  . Dizziness and weakness   Immunizations Administered    Name Date Dose VIS Date Route   Pfizer COVID-19 Vaccine 02/12/2020 10:48 AM 0.3 mL 11/23/2019 Intramuscular   Manufacturer: [place]   Lot: [id number]   [place]: [id number]

## 2020-08-07 LAB — HM DIABETES EYE EXAM

## 2020-08-13 ENCOUNTER — Other Ambulatory Visit: Payer: Self-pay

## 2020-08-13 ENCOUNTER — Ambulatory Visit (INDEPENDENT_AMBULATORY_CARE_PROVIDER_SITE_OTHER): Payer: 59 | Admitting: Family Medicine

## 2020-08-13 ENCOUNTER — Encounter: Payer: Self-pay | Admitting: Family Medicine

## 2020-08-13 VITALS — BP 145/82 | HR 78 | Temp 97.7°F | Ht 70.0 in | Wt 177.4 lb

## 2020-08-13 DIAGNOSIS — Z1211 Encounter for screening for malignant neoplasm of colon: Secondary | ICD-10-CM | POA: Diagnosis not present

## 2020-08-13 DIAGNOSIS — E1151 Type 2 diabetes mellitus with diabetic peripheral angiopathy without gangrene: Secondary | ICD-10-CM

## 2020-08-13 DIAGNOSIS — I10 Essential (primary) hypertension: Secondary | ICD-10-CM

## 2020-08-13 DIAGNOSIS — E1159 Type 2 diabetes mellitus with other circulatory complications: Secondary | ICD-10-CM

## 2020-08-13 DIAGNOSIS — M5430 Sciatica, unspecified side: Secondary | ICD-10-CM | POA: Diagnosis not present

## 2020-08-13 DIAGNOSIS — I152 Hypertension secondary to endocrine disorders: Secondary | ICD-10-CM

## 2020-08-13 LAB — POCT GLYCOSYLATED HEMOGLOBIN (HGB A1C): Hemoglobin A1C: 6.4 % — AB (ref 4.0–5.6)

## 2020-08-13 MED ORDER — METFORMIN HCL ER 750 MG PO TB24
750.0000 mg | ORAL_TABLET | Freq: Two times a day (BID) | ORAL | 3 refills | Status: DC
Start: 2020-08-13 — End: 2020-12-18

## 2020-08-13 NOTE — Progress Notes (Signed)
   [name] is a 74 y.o. male who presents today for an office visit.  Assessment/Plan:  Chronic Problems Addressed Today: DM (diabetes mellitus), type 2 with peripheral vascular complications ([name]) M0N stable at 6.4. Continue metformin 750mg  twice daily. Recheck in 6 months.   Sciatica Stable. Given good A1c control, could consider low dose prednisone burst for future flares.   Hypertension associated with diabetes ([place]) Stable. Continue lospressor 12.5mg  twice daily.     Subjective:  HPI:  See A/p.         Objective:  Physical Exam: BP (!) 145/82   Pulse 78   Temp 97.7 F (36.5 C) (Temporal)   Ht 5\' 10"  (1.778 m)   Wt 177 lb 6.4 oz (80.5 kg)   SpO2 96%   BMI 25.45 kg/m   Gen: No acute distress, resting comfortably CV: Regular rate and rhythm with no murmurs appreciated Pulm: Normal work of breathing, clear to auscultation bilaterally with no crackles, wheezes, or rhonchi Neuro: Grossly normal, moves all extremities Psych: Normal affect and thought content      Robert Lisa M. Jerline Pain, MD 08/13/2020 9:58 AM

## 2020-08-13 NOTE — Assessment & Plan Note (Addendum)
A1c stable at 6.4. Continue metformin 750mg  twice daily. Recheck in 6 months.

## 2020-08-13 NOTE — Progress Notes (Signed)
a1c

## 2020-08-13 NOTE — Assessment & Plan Note (Signed)
Stable. Continue lospressor 12.5mg  twice daily.

## 2020-08-13 NOTE — Patient Instructions (Addendum)
It was very nice to see you today!  Your A1c is stable at 6.4.  We will order cologuard today.   No medication changes today.  Come back in 6 months for your annual check up, or sooner if needed.   Take care, Dr Jerline Pain  Please try these tips to maintain a healthy lifestyle:   Eat at least 3 REAL meals and 1-2 snacks per day.  Aim for no more than 5 hours between eating.  If you eat breakfast, please do so within one hour of getting up.    Each meal should contain half fruits/vegetables, one quarter protein, and one quarter carbs (no bigger than a computer mouse)   Cut down on sweet beverages. This includes juice, soda, and sweet tea.     Drink at least 1 glass of water with each meal and aim for at least 8 glasses per day   Exercise at least 150 minutes every week.

## 2020-08-13 NOTE — Assessment & Plan Note (Signed)
Stable. Given good A1c control, could consider low dose prednisone burst for future flares.

## 2020-08-18 ENCOUNTER — Other Ambulatory Visit: Payer: Self-pay | Admitting: Family

## 2020-08-22 ENCOUNTER — Ambulatory Visit: Payer: 59

## 2020-08-24 ENCOUNTER — Other Ambulatory Visit: Payer: Self-pay | Admitting: Family

## 2020-08-26 LAB — COLOGUARD

## 2020-08-29 ENCOUNTER — Ambulatory Visit (INDEPENDENT_AMBULATORY_CARE_PROVIDER_SITE_OTHER): Payer: 59

## 2020-08-29 ENCOUNTER — Other Ambulatory Visit: Payer: Self-pay

## 2020-08-29 DIAGNOSIS — Z0000 Encounter for general adult medical examination without abnormal findings: Secondary | ICD-10-CM | POA: Diagnosis not present

## 2020-08-29 NOTE — Patient Instructions (Signed)
[name] , Thank you for taking time to come for your Medicare Wellness Visit. I appreciate your ongoing commitment to your health goals. Please review the following plan we discussed and let me know if I can assist you in the future.   Screening recommendations/referrals: Colonoscopy: Done 07/27/17 Recommended yearly ophthalmology/optometry visit for glaucoma screening and checkup Recommended yearly dental visit for hygiene and checkup  Vaccinations: Influenza vaccine: Up to date Pneumococcal vaccine: Up to date Tdap vaccine: Up to date Shingles vaccine: Shingrix discussed. Please contact your pharmacy for coverage information.    Covid-19: Completed 2/4, 3/2, & 08/06/20  Advanced directives: Please bring a copy of your health care power of attorney and living will to the office at your convenience.  Conditions/risks identified: Stay active  Next appointment: Follow up in one year for your annual wellness visit.   Preventive Care 73 Years and Older, Male Preventive care refers to lifestyle choices and visits with your health care provider that can promote health and wellness. What does preventive care include?  A yearly physical exam. This is also called an annual well check.  Dental exams once or twice a year.  Routine eye exams. Ask your health care provider how often you should have your eyes checked.  Personal lifestyle choices, including:  Daily care of your teeth and gums.  Regular physical activity.  Eating a healthy diet.  Avoiding tobacco and drug use.  Limiting alcohol use.  Practicing safe sex.  Taking low doses of aspirin every day.  Taking vitamin and mineral supplements as recommended by your health care provider. What happens during an annual well check? The services and screenings done by your health care provider during your annual well check will depend on your age, overall health, lifestyle risk factors, and family history of  disease. Counseling  Your health care provider may ask you questions about your:  Alcohol use.  Tobacco use.  Drug use.  Emotional well-being.  Home and relationship well-being.  Sexual activity.  Eating habits.  History of falls.  Memory and ability to understand (cognition).  Work and work Statistician. Screening  You may have the following tests or measurements:  Height, weight, and BMI.  Blood pressure.  Lipid and cholesterol levels. These may be checked every 5 years, or more frequently if you are over 73 years old.  Skin check.  Lung cancer screening. You may have this screening every year starting at age 73 if you have a 30-pack-year history of smoking and currently smoke or have quit within the past 15 years.  Fecal occult blood test (FOBT) of the stool. You may have this test every year starting at age 73.  Flexible sigmoidoscopy or colonoscopy. You may have a sigmoidoscopy every 5 years or a colonoscopy every 10 years starting at age 73.  Prostate cancer screening. Recommendations will vary depending on your family history and other risks.  Hepatitis C blood test.  Hepatitis B blood test.  Sexually transmitted disease (STD) testing.  Diabetes screening. This is done by checking your blood sugar (glucose) after you have not eaten for a while (fasting). You may have this done every 1-3 years.  Abdominal aortic aneurysm (AAA) screening. You may need this if you are a current or former smoker.  Osteoporosis. You may be screened starting at age 73 if you are at high risk. Talk with your health care provider about your test results, treatment options, and if necessary, the need for more tests. Vaccines  Your health  care provider may recommend certain vaccines, such as:  Influenza vaccine. This is recommended every year.  Tetanus, diphtheria, and acellular pertussis (Tdap, Td) vaccine. You may need a Td booster every 10 years.  Zoster vaccine. You may  need this after age 8.  Pneumococcal 13-valent conjugate (PCV13) vaccine. One dose is recommended after age 66.  Pneumococcal polysaccharide (PPSV23) vaccine. One dose is recommended after age 40. Talk to your health care provider about which screenings and vaccines you need and how often you need them. This information is not intended to replace advice given to you by your health care provider. Make sure you discuss any questions you have with your health care provider. Document Released: 12/26/2015 Document Revised: 08/18/2016 Document Reviewed: 09/30/2015 Elsevier Interactive Patient Education  2017 [place] Prevention in the Home Falls can cause injuries. They can happen to people of all ages. There are many things you can do to make your home safe and to help prevent falls. What can I do on the outside of my home?  Regularly fix the edges of walkways and driveways and fix any cracks.  Remove anything that might make you trip as you walk through a door, such as a raised step or threshold.  Trim any bushes or trees on the path to your home.  Use bright outdoor lighting.  Clear any walking paths of anything that might make someone trip, such as rocks or tools.  Regularly check to see if handrails are loose or broken. Make sure that both sides of any steps have handrails.  Any raised decks and porches should have guardrails on the edges.  Have any leaves, snow, or ice cleared regularly.  Use sand or salt on walking paths during winter.  Clean up any spills in your garage right away. This includes oil or grease spills. What can I do in the bathroom?  Use night lights.  Install grab bars by the toilet and in the tub and shower. Do not use towel bars as grab bars.  Use non-skid mats or decals in the tub or shower.  If you need to sit down in the shower, use a plastic, non-slip stool.  Keep the floor dry. Clean up any water that spills on the floor as soon as it  happens.  Remove soap buildup in the tub or shower regularly.  Attach bath mats securely with double-sided non-slip rug tape.  Do not have throw rugs and other things on the floor that can make you trip. What can I do in the bedroom?  Use night lights.  Make sure that you have a light by your bed that is easy to reach.  Do not use any sheets or blankets that are too big for your bed. They should not hang down onto the floor.  Have a firm chair that has side arms. You can use this for support while you get dressed.  Do not have throw rugs and other things on the floor that can make you trip. What can I do in the kitchen?  Clean up any spills right away.  Avoid walking on wet floors.  Keep items that you use a lot in easy-to-reach places.  If you need to reach something above you, use a strong step stool that has a grab bar.  Keep electrical cords out of the way.  Do not use floor polish or wax that makes floors slippery. If you must use wax, use non-skid floor wax.  Do not have  throw rugs and other things on the floor that can make you trip. What can I do with my stairs?  Do not leave any items on the stairs.  Make sure that there are handrails on both sides of the stairs and use them. Fix handrails that are broken or loose. Make sure that handrails are as long as the stairways.  Check any carpeting to make sure that it is firmly attached to the stairs. Fix any carpet that is loose or worn.  Avoid having throw rugs at the top or bottom of the stairs. If you do have throw rugs, attach them to the floor with carpet tape.  Make sure that you have a light switch at the top of the stairs and the bottom of the stairs. If you do not have them, ask someone to add them for you. What else can I do to help prevent falls?  Wear shoes that:  Do not have high heels.  Have rubber bottoms.  Are comfortable and fit you well.  Are closed at the toe. Do not wear sandals.  If you  use a stepladder:  Make sure that it is fully opened. Do not climb a closed stepladder.  Make sure that both sides of the stepladder are locked into place.  Ask someone to hold it for you, if possible.  Clearly mark and make sure that you can see:  Any grab bars or handrails.  First and last steps.  Where the edge of each step is.  Use tools that help you move around (mobility aids) if they are needed. These include:  Canes.  Walkers.  Scooters.  Crutches.  Turn on the lights when you go into a dark area. Replace any light bulbs as soon as they burn out.  Set up your furniture so you have a clear path. Avoid moving your furniture around.  If any of your floors are uneven, fix them.  If there are any pets around you, be aware of where they are.  Review your medicines with your doctor. Some medicines can make you feel dizzy. This can increase your chance of falling. Ask your doctor what other things that you can do to help prevent falls. This information is not intended to replace advice given to you by your health care provider. Make sure you discuss any questions you have with your health care provider. Document Released: 09/25/2009 Document Revised: 05/06/2016 Document Reviewed: 01/03/2015 Elsevier Interactive Patient Education  2017 [employer].

## 2020-08-29 NOTE — Progress Notes (Signed)
Virtual Visit via Telephone Note  I connected with  [name] on 08/29/20 at 10:15 AM EDT by telephone and verified that I am speaking with the correct person using two identifiers.  Medicare Annual Wellness visit completed telephonically due to Covid-19 pandemic.   Persons participating in this call: This Health Coach and this patient.   Location: Patient: Home Provider: Office   I discussed the limitations, risks, security and privacy concerns of performing an evaluation and management service by telephone and the availability of in person appointments. The patient expressed understanding and agreed to proceed.  Unable to perform video visit due to video visit attempted and failed and/or patient does not have video capability.   Some vital signs may be absent or patient reported.   Willette Brace, LPN    Subjective:   [name] is a 74 y.o. male who presents for Medicare Annual/Subsequent preventive examination.  Review of Systems     Cardiac Risk Factors include: diabetes mellitus;dyslipidemia;hypertension;male gender     Objective:    There were no vitals filed for this visit. There is no height or weight on file to calculate BMI.  Advanced Directives 08/29/2020 08/29/2017 08/24/2017 11/12/2015 03/29/2015  Does Patient Have a Medical Advance Directive? Yes Yes Yes No Yes  Type of Paramedic of [place];Living will [place];Living will [place];Living will - [place];Living will  Does patient want to make changes to medical advance directive? - No - Patient declined - - No - Patient declined  Copy of [place] in Chart? No - copy requested Yes Yes - No - copy requested    Current Medications (verified) Outpatient Encounter Medications as of 08/29/2020  Medication Sig  . aspirin EC 81 MG tablet Take 81 mg by mouth daily.  Marland Kitchen atorvastatin (LIPITOR) 80  MG tablet TAKE 1 TABLET BY MOUTH  DAILY AT 6PM  . cetirizine (ZYRTEC) 10 MG tablet Take 10 mg by mouth daily.  . Cholecalciferol (VITAMIN D-3) 1000 UNITS CAPS Take 5,000 Units by mouth daily.   . famotidine (PEPCID) 20 MG tablet Take 1 tablet (20 mg total) by mouth 2 (two) times daily.  . finasteride (PROSCAR) 5 MG tablet Take 5 mg by mouth daily.  . fluticasone (FLONASE) 50 MCG/ACT nasal spray USE 2 SPRAYS IN EACH  NOSTRIL DAILY  . folic acid (FOLVITE) 1 MG tablet Take 1 mg by mouth daily.    . meloxicam (MOBIC) 7.5 MG tablet Take 7.5 mg by mouth 2 (two) times daily.  . metFORMIN (GLUCOPHAGE-XR) 750 MG 24 hr tablet Take 1 tablet (750 mg total) by mouth 2 (two) times daily.  . methotrexate (RHEUMATREX) 2.5 MG tablet Take 15 mg by mouth once a week. Caution:Chemotherapy. Protect from light.  . metoprolol tartrate (LOPRESSOR) 25 MG tablet TAKE ONE-HALF TABLET BY  MOUTH TWICE DAILY  . nitroGLYCERIN (NITROSTAT) 0.4 MG SL tablet Place 1 tablet under the tongue as needed. Every 5 minutes for chest pain  . Probiotic Product (PRO-BIOTIC BLEND PO) Take 1 tablet by mouth daily.  . SODIUM FLUORIDE 5000 PPM 1.1 % PSTE Take by mouth as directed.  . traMADol (ULTRAM) 50 MG tablet Take 50 mg by mouth every 6 (six) hours as needed for moderate pain.  . traZODone (DESYREL) 50 MG tablet TAKE 1 TABLET BY MOUTH AT  BEDTIME   No facility-administered encounter medications on file as of 08/29/2020.    Allergies (verified) Penicillins,  Contrast media [iodinated diagnostic agents], Sulfamethoxazole, and Ibuprofen   History: Past Medical History:  Diagnosis Date  . Allergic rhinitis   . Arthritis   . CAD (coronary artery disease)    a. 11/11/15 - OOH VF arrest 2/2 ant STEMI >> CPR//AED shock x 2 >> ROSC >> LHC: pLAD 100 (Promus DES), mLCx 50, mRCA 30, EF 25% [managed with hypothermia protocol]  . Fractured rib   . GERD (gastroesophageal reflux disease)   . History of Clostridium difficile colitis 10/2015    post MI >> required IV Flagyl and IV Vanc >> DC on po Vanc  . History of kidney stones   . History of lithotripsy   . HLD (hyperlipidemia)   . Hypertension   . Impaired glucose tolerance   . Insomnia   . Ischemic cardiomyopathy    a. EF at [facility]11[facility] 25%;  b. Echo 11/14/15: EF 50-55%, dist ant and anterior septal HK  . Nephrolithiasis 1968  . Pelvic fracture ([place]) 09/2015  . Pre-diabetes   . Rheumatoid arthritis(714.0) 1998  . ST elevation (STEMI) myocardial infarction involving left anterior descending coronary artery ([place]) 11/11/15   100% LAD, Cardiogenic Shock. -- EF ~20-25%   Past Surgical History:  Procedure Laterality Date  . CARDIAC CATHETERIZATION N/A 11/11/2015   Procedure: Left Heart Cath and Cors/Grafts Angiography;  Surgeon: Sherren Mocha, MD;  Location: [place] [facility];  Service: Cardiovascular;  Laterality: N/A;  . CARDIAC CATHETERIZATION N/A 11/11/2015   Procedure: Coronary Stent Intervention;  Surgeon: Sherren Mocha, MD;  Location: [place] [facility];  Service: Cardiovascular;  Laterality: N/A;  prox lad 3.5x38 promus  . CORONARY ANGIOPLASTY WITH STENT PLACEMENT  2016  . EXTRACORPOREAL SHOCK WAVE LITHOTRIPSY Left 08/29/2017   Procedure: LEFT EXTRACORPOREAL SHOCK WAVE LITHOTRIPSY (ESWL);  Surgeon: Raynelle Bring, MD;  Location: [facility];  Service: Urology;  Laterality: Left;  . status post multiple lithotripy and urological surgery for reccurent calcium oxalate stones     Family History  Problem Relation Age of Onset  . Cancer Mother        pancreatic ; pulmonary embolism   . Diabetes Father    Social History   Socioeconomic History  . Marital status: Married    Spouse name: Not on file  . Number of children: Not on file  . Years of education: Not on file  . Highest education level: Not on file  Occupational History  . Occupation: retired  Tobacco Use  . 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 Smokeless tobacco: Never Used  . Tobacco comment: quit 30 yrs ago    Vaping Use  . Vaping Use: Never used  Substance and Sexual Activity  . Alcohol use: No  . Drug use: No  . Sexual activity: Not on file  Other Topics Concern  . Not on file  Social History Narrative  . Not on file   Social Determinants of Health   Financial Resource Strain: Low Risk   . Difficulty of Paying Living Expenses: Not hard at all  Food Insecurity: No Food Insecurity  . Worried About Charity fundraiser in the Last Year: Never true  . Ran Out of Food in the Last Year: Never true  Transportation Needs: No Transportation Needs  . Lack of Transportation (Medical): No  . Lack of Transportation (Non-Medical): No  Physical Activity: Sufficiently Active  . Days of Exercise per Week: 5 days  . Minutes of Exercise per Session: 60 min  Stress: No Stress Concern Present  .  Feeling of Stress : Not at all  Social Connections: Socially Isolated  . Frequency of Communication with Friends and Family: Once a week  . Frequency of Social Gatherings with Friends and Family: Never  . Attends Religious Services: Never  . Active Member of Clubs or Organizations: No  . Attends Archivist Meetings: Never  . Marital Status: Married    Tobacco Counseling Counseling given: Not Answered Comment: quit 30 yrs ago   Clinical Intake:  Pre-visit preparation completed: Yes  Pain : No/denies pain     BMI - recorded: 25.45 Nutritional Status: BMI 25 -29 Overweight Diabetes: Yes CBG done?: No Did pt. bring in CBG monitor from home?: No  How often do you need to have someone help you when you read instructions, pamphlets, or other written materials from your doctor or pharmacy?: 1 - Never  Diabetic?Yes  Interpreter Needed?: No  Information entered by :: Charlott Rakes, LPN   Activities of Daily Living In your present state of health, do you have any difficulty performing the following activities: 08/29/2020  Hearing? N  Vision? N  Difficulty concentrating or making  decisions? N  Walking or climbing stairs? N  Dressing or bathing? N  Doing errands, shopping? N  Preparing Food and eating ? N  Using the Toilet? N  In the past six months, have you accidently leaked urine? N  Do you have problems with loss of bowel control? N  Managing your Medications? N  Managing your Finances? N  Housekeeping or managing your Housekeeping? N  Some recent data might be hidden    Patient Care Team: Vivi Barrack, MD as PCP - General (Family Medicine) Sherren Mocha, MD as PCP - Cardiology (Cardiology)  Indicate any recent Medical Services you may have received from other than [facility] providers in the past year (date may be approximate).     Assessment:   This is a routine wellness examination for [place].  Hearing/Vision screen  Hearing Screening   125Hz  250Hz  500Hz  1000Hz  2000Hz  3000Hz  4000Hz  6000Hz  8000Hz   Right ear:           Left ear:           Comments: Pt denies any difficulty at this time  Vision Screening Comments: Dr Idolina Primer follows up annually for eye exams  Dietary issues and exercise activities discussed: Current Exercise Habits: Home exercise routine, Type of exercise: walking;Other - see comments (yard work, riding bike and walking), Time (Minutes): 60, Frequency (Times/Week): 5, Weekly Exercise (Minutes/Week): 300  Goals    . Patient Stated     Stay active      Depression Screen PHQ 2/9 Scores 08/29/2020 01/01/2019 06/29/2018 08/09/2016 01/05/2016 07/31/2015 12/25/2013  PHQ - 2 Score 0 0 0 0 0 0 0    Fall Risk Fall Risk  08/29/2020 06/29/2018 08/09/2016 08/06/2016 12/25/2013  Falls in the past year? 0 No No No No  Comment - - - Emmi Telephone Survey: data to providers prior to load -  Number falls in past yr: 0 - - - -  Injury with Fall? 0 - - - -  Risk for fall due to : Impaired vision - - - -  Follow up Falls prevention discussed - - - -    Any stairs in or around the home? Yes  If so, are there any without handrails? No  Home free of  loose throw rugs in walkways, pet beds, electrical cords, etc? Yes  Adequate lighting in your home to  reduce risk of falls? Yes   ASSISTIVE DEVICES UTILIZED TO PREVENT FALLS:  Life alert? No  Use of a cane, walker or w/c? No  Grab bars in the bathroom? No  Shower chair or bench in shower? No  Elevated toilet seat or a handicapped toilet? No   TIMED UP AND GO:  Was the test performed? No .    Cognitive Function:     6CIT Screen 08/29/2020  What Year? 0 points  What month? 0 points  Count back from 20 0 points  Months in reverse 0 points  Repeat phrase 2 points    Immunizations Immunization History  Administered Date(s) Administered  . Influenza Split 09/26/2013  . Influenza Whole 09/23/2009, 09/02/2010  . Influenza, High Dose Seasonal PF 10/03/2015, 09/09/2017  . Influenza-Unspecified 10/13/2014, 09/23/2016, 09/09/2017, 09/12/2018  . PFIZER SARS-COV-2 Vaccination 01/17/2020, 02/12/2020, 08/06/2020  . Pneumococcal Conjugate-13 10/03/2015  . Pneumococcal Polysaccharide-23 12/25/2013  . Tdap 12/25/2013    TDAP status: Up to date Flu Vaccine status: Up to date Pneumococcal vaccine status: Up to date Covid-19 vaccine status: Completed vaccines  Qualifies for Shingles Vaccine? Yes   Zostavax completed No   Shingrix Completed?: No.    Education has been provided regarding the importance of this vaccine. Patient has been advised to call insurance company to determine out of pocket expense if they have not yet received this vaccine. Advised may also receive vaccine at local pharmacy or Health Dept. Verbalized acceptance and understanding.  Screening Tests Health Maintenance  Topic Date Due  . FOOT EXAM  07/27/2018  . URINE MICROALBUMIN  07/27/2018  . Fecal DNA (Cologuard)  08/11/2020  . INFLUENZA VACCINE  09/11/2020 (Originally 07/13/2020)  . HEMOGLOBIN A1C  02/10/2021  . OPHTHALMOLOGY EXAM  08/07/2021  . TETANUS/TDAP  12/26/2023  . COVID-19 Vaccine  Completed  .  Hepatitis C Screening  Completed  . PNA vac Low Risk Adult  Completed    Health Maintenance  Health Maintenance Due  Topic Date Due  . FOOT EXAM  07/27/2018  . URINE MICROALBUMIN  07/27/2018  . Fecal DNA (Cologuard)  08/11/2020    Colorectal cancer screening: Completed 07/27/17. Repeat every 3 years    Additional Screening:  Hepatitis C Screening:  Completed 08/09/16  Vision Screening: Recommended annual ophthalmology exams for early detection of glaucoma and other disorders of the eye. Is the patient up to date with their annual eye exam?  Yes  Who is the provider or what is the name of the office in which the patient attends annual eye exams? Dr Idolina Primer  Dental Screening: Recommended annual dental exams for proper oral hygiene  Community Resource Referral / Chronic Care Management: CRR required this visit?  No   CCM required this visit?  No      Plan:     I have personally reviewed and noted the following in the patient's chart:   . Medical and social history . Use of alcohol, tobacco or illicit drugs  . Current medications and supplements . Functional ability and status . Nutritional status . Physical activity . Advanced directives . List of other physicians . Hospitalizations, surgeries, and ER visits in previous 12 months . Vitals . Screenings to include cognitive, depression, and falls . Referrals and appointments  In addition, I have reviewed and discussed with patient certain preventive protocols, quality metrics, and best practice recommendations. A written personalized care plan for preventive services as well as general preventive health recommendations were provided to patient.  Willette Brace, LPN   8/59/0931   Nurse Notes: None

## 2020-08-30 LAB — COLOGUARD
COLOGUARD: NEGATIVE
Cologuard: NEGATIVE

## 2020-08-30 LAB — EXTERNAL GENERIC LAB PROCEDURE: COLOGUARD: NEGATIVE

## 2020-09-01 ENCOUNTER — Encounter: Payer: Self-pay | Admitting: Family Medicine

## 2020-09-09 ENCOUNTER — Other Ambulatory Visit: Payer: Self-pay | Admitting: Family Medicine

## 2020-09-09 NOTE — Telephone Encounter (Signed)
LAST APPOINTMENT DATE: 08/13/2020   NEXT APPOINTMENT DATE: 02/11/2021   Rx Trazodone LAST REFILL: 08/13/2019   QTY:90 Ref 3

## 2020-10-03 ENCOUNTER — Encounter: Payer: Self-pay | Admitting: Family Medicine

## 2020-10-21 DIAGNOSIS — N401 Enlarged prostate with lower urinary tract symptoms: Secondary | ICD-10-CM | POA: Diagnosis not present

## 2020-10-28 DIAGNOSIS — R3912 Poor urinary stream: Secondary | ICD-10-CM | POA: Diagnosis not present

## 2020-10-28 DIAGNOSIS — N401 Enlarged prostate with lower urinary tract symptoms: Secondary | ICD-10-CM | POA: Diagnosis not present

## 2020-10-28 DIAGNOSIS — N200 Calculus of kidney: Secondary | ICD-10-CM | POA: Diagnosis not present

## 2020-11-11 DIAGNOSIS — M0589 Other rheumatoid arthritis with rheumatoid factor of multiple sites: Secondary | ICD-10-CM | POA: Diagnosis not present

## 2020-11-18 ENCOUNTER — Other Ambulatory Visit: Payer: Self-pay | Admitting: *Deleted

## 2020-11-18 ENCOUNTER — Other Ambulatory Visit: Payer: Self-pay | Admitting: Cardiovascular Disease

## 2020-11-18 DIAGNOSIS — I10 Essential (primary) hypertension: Secondary | ICD-10-CM

## 2020-11-18 MED ORDER — METOPROLOL TARTRATE 25 MG PO TABS: 12.5000 mg | ORAL_TABLET | Freq: Two times a day (BID) | ORAL | 0 refills | Status: DC

## 2020-11-18 NOTE — Telephone Encounter (Signed)
S/w pt to refill pt's lopressor one half tablet by mouth ( 12.5 mg) twice daily.  Changed pt's pharmacies, schedule pt appt that was due for January.

## 2020-11-18 NOTE — Telephone Encounter (Signed)
Rx sent to pharmacy   

## 2020-11-18 NOTE — Telephone Encounter (Signed)
*  STAT* If patient is at the pharmacy, call can be transferred to refill team.   1. Which medications need to be refilled? (please list name of each medication and dose if known) *new presription for Metoprolol  2. Which pharmacy/location (including street and city if local pharmacy) is medication to be sent to? [facility]  3. Do they need a 30 day or 90 day supply? 90 days and refills

## 2020-11-24 ENCOUNTER — Telehealth: Payer: Self-pay | Admitting: Cardiovascular Disease

## 2020-11-24 MED ORDER — ATORVASTATIN CALCIUM 80 MG PO TABS: ORAL_TABLET | ORAL | 0 refills | Status: DC

## 2020-11-24 NOTE — Telephone Encounter (Signed)
Pt's medication was sent to pt's pharmacy as requested. Confirmation received.  °

## 2020-11-24 NOTE — Telephone Encounter (Signed)
°*  STAT* If patient is at the pharmacy, call can be transferred to refill team.   1. Which medications need to be refilled? (please list name of each medication and dose if known)  atorvastatin (LIPITOR) 80 MG tablet    2. Which pharmacy/location (including street and city if local pharmacy) is medication to be sent to? [facility] - [place], [place]  3. Do they need a 30 day or 90 day supply? [place]

## 2020-11-25 ENCOUNTER — Telehealth: Payer: Self-pay

## 2020-11-25 NOTE — Telephone Encounter (Signed)
[employer] faxed [phone]

## 2020-11-25 NOTE — Telephone Encounter (Signed)
Caryl Pina is calling in from [facility] asking for confirmation if we have have received a fax they have sent that confirms [name] has congestive heart failure and diabetes. Confirmed our fax number and Caryl Pina states that they have faxed it over several times, and just sent over the papers.I have placed the papers in the basket up front.

## 2020-11-25 NOTE — Telephone Encounter (Signed)
Form received

## 2020-12-01 ENCOUNTER — Other Ambulatory Visit: Payer: Self-pay

## 2020-12-01 NOTE — Patient Outreach (Signed)
  [facility]) Care Management Chronic Special Needs Program    12/01/2020  Name: [name], DOB: 20-Jan-1946  MRN: [id number]   [name] is enrolled in a chronic special needs plan.  Telephone call to client for initial [place] assessment outreach. Unable to reach client. HIPAA compliant voice message left with call back phone number and return call request.   PLAN; RNCM will attempt follow up in 1-2 months.   Quinn Plowman RN,BSN,CCM [facility] Care Management [phone]

## 2020-12-01 NOTE — Patient Outreach (Signed)
[facility]) Care Management Chronic Special Needs Program  12/01/2020  Name: [name] DOB: 02-27-1946  MRN: [id number]  [name] is enrolled in a chronic special needs plan for Diabetes. Chronic Care Management Coordinator telephoned client to review health risk assessment and to develop individualized care plan. HIPAA verified.  Introduced the chronic care management program, importance of client participation, and taking their care plan to all provider appointments and inpatient facilities.   Client states he is doing well. Client reports he follows up with his providers regularly. He states he has transportation to his appointments. Client states he takes his medications as prescribed and reports he is able to afford them.  Client states he checks his blood sugars occasionally. He reports his blood sugars range from 80- low 100's.  Client reports he is active with: Kayaking, running, walking on the treadmill, and maintaining his 2 acre property.   Subjective:  Goals Addressed              This Visit's Progress   .  "maintain my health like it currently is" (pt-stated)        Follow up with your providers as recommended. Take your medications as prescribed.  Incorporate activity into your daily schedule RN case manager will send client education article: Preventative health care for older adults.        .  Client understands the importance of follow-up with providers by attending scheduled visits        Primary care provider: 08/13/20 Cardiologist 12/17/19, 07/02/20 Follow up with your providers as recommended.     .  Client verbalizes knowledge of Heart Attack self management skills in 12 months        RN case manager will send client education article: Heart disease in diabetics and lowering your risk of heart disease. Low salt diet, and Heart healthy diet.   Call 911 for severe symptoms such as: chest pain, sweating, nausea/ vomiting,  irregular heartbeat, rapid heart rate, shortness of breath, dizziness or fainting Take your medications as prescribed.  Follow up with your doctor for appointments and routine testing as recommended.  Follow a low salt, heart healthy diet     .  Client will verbalize knowledge of self management of Hypertension as evidences by BP reading of 140/90 or less; or as defined by provider        RN case manager will send client education article: High blood pressure in adults Plan to check your blood pressure regularly. Take prescription medications as recommended by your doctor Eat a low salt, heart healthy diet  Exercise Follow up with your doctor as advised.    Marland Kitchen  HEMOGLOBIN A1C < 7        Discussed diabetes self management actions:  Glucose monitoring per provider recommendation  Check feet daily  Visit provider every 3-6 months as directed  Hbg A1C level every 3-6 months.  Eye Exam yearly  Carbohydrate controlled meal planning  Taking diabetes medication as prescribed by provider  Physical activity     .  Maintain timely refills of diabetic medication as prescribed within the year .        Client reports maintaining timely refills of his medication. Discussed and reviewed medication with RN case manager Contact your RN case manager if you have difficulty obtaining your medications.     .  Obtain annual  Lipid Profile, LDL-C        Annual lipid profile: 07/03/19. Avoid saturated  fats, trans-fats and eat more fiber Take your statin medication as prescribed.     .  COMPLETED: Obtain Annual Eye (retinal)  Exam         Annual eye exam: 08/07/20    .  Obtain Annual Foot Exam        Unable to determine most recent foot exam. Discussed with client importance of yearly foot check Diabetes can affect the nerves in your feet, causing decreased feeling or numbness.  Diabetes foot care - Check feet daily at home (look for skin color changes, cuts, sores or cracks in the skin,  swelling of feet or ankles, ingrown or fungal toenails, corn or calluses). Report these findings to your doctor - Wash feet with soap and water, dry feet well especially between toes - Moisturize your feet but not between the toes - Always wear shoes that protect your whole feet.      .  Obtain annual screen for micro albuminuria (urine) , nephropathy (kidney problems)   On track     Annual micro albuminuria screening:  07/27/17 Diabetes can affect your kidneys.  It is important for your doctor to check your urine at least once a year.  RN case manager will send client education article: Urine Albumin test    .  Obtain Hemoglobin A1C at least 2 times per year        Congratulations your Hgb A1c is below goal at 6.4% completed on 08/13/20 Continue a heart healthy, low salt, and low carbohydrate diet Take your medications as prescribed Check your blood sugars as directed by your doctor    .  Visit Primary Care Provider or Endocrinologist at least 2 times per year         Primary care provider: 08/13/20 Cardiologist 12/17/19, 07/02/20 Follow up with your providers as recommended.        Plan:  Send successful outreach letter with a copy of their individualized care plan, Send individual care plan to provider and Send educational material  Chronic care management coordination will outreach in: 12 months    Quinn Plowman RN,BSN,CCM [facility] [phone]

## 2020-12-08 ENCOUNTER — Telehealth: Payer: Self-pay

## 2020-12-08 NOTE — Telephone Encounter (Signed)
LR: 09-09-2020 Qty: 90 with 3 refills Last office visit: 08-13-2020 Upcoming appointment: 02-11-2021

## 2020-12-08 NOTE — Telephone Encounter (Signed)
MEDICATION: trazodone 50 MG 90 day supply  PHARMACY: [facility] by [facility]  Comments:   **Let patient know to contact pharmacy at the end of the day to make sure medication is ready. **  ** Please notify patient to allow 48-72 hours to process**  **Encourage patient to contact the pharmacy for refills or they can request refills through [facility]*

## 2020-12-09 NOTE — Telephone Encounter (Signed)
LVM to return call   Pt has 2 Refills left on Rx Trazadone Please contact pharmacy

## 2020-12-09 NOTE — Telephone Encounter (Signed)
Pt needs medication sent to [facility]

## 2020-12-09 NOTE — Telephone Encounter (Signed)
Ok with me. Please place any necessary orders. 

## 2020-12-09 NOTE — Telephone Encounter (Signed)
Spoke with patient has 2 refills left will transfer to preferred pharmacy

## 2020-12-10 MED ORDER — TRAZODONE HCL 50 MG PO TABS
50.0000 mg | ORAL_TABLET | Freq: Every day | ORAL | 3 refills | Status: DC
Start: 2020-12-10 — End: 2021-12-09

## 2020-12-10 NOTE — Addendum Note (Signed)
Addended by: Dyann Kief on: 12/10/2020 11:21 AM   Modules accepted: Orders

## 2020-12-10 NOTE — Telephone Encounter (Signed)
Pt requesting Rx trazodone refill due to pharmacy change  Rx refilled

## 2020-12-10 NOTE — Telephone Encounter (Signed)
Pt called [name], the new pharmacy, and they are unable to transfer the medication because it is a controlled substance. [facility] is requesting a new prescription for the Trazadone.

## 2020-12-16 ENCOUNTER — Other Ambulatory Visit: Payer: Self-pay

## 2020-12-18 ENCOUNTER — Other Ambulatory Visit: Payer: Self-pay | Admitting: *Deleted

## 2020-12-18 MED ORDER — METFORMIN HCL ER 750 MG PO TB24: 750.0000 mg | ORAL_TABLET | Freq: Two times a day (BID) | ORAL | 2 refills | Status: DC

## 2020-12-25 ENCOUNTER — Ambulatory Visit (INDEPENDENT_AMBULATORY_CARE_PROVIDER_SITE_OTHER): Payer: HMO | Admitting: Physician Assistant

## 2020-12-25 ENCOUNTER — Other Ambulatory Visit: Payer: Self-pay

## 2020-12-25 ENCOUNTER — Other Ambulatory Visit: Payer: Self-pay | Admitting: *Deleted

## 2020-12-25 ENCOUNTER — Encounter: Payer: Self-pay | Admitting: Physician Assistant

## 2020-12-25 VITALS — BP 132/70 | HR 70 | Ht 70.0 in | Wt 180.0 lb

## 2020-12-25 DIAGNOSIS — E785 Hyperlipidemia, unspecified: Secondary | ICD-10-CM | POA: Diagnosis not present

## 2020-12-25 DIAGNOSIS — I10 Essential (primary) hypertension: Secondary | ICD-10-CM

## 2020-12-25 DIAGNOSIS — I2510 Atherosclerotic heart disease of native coronary artery without angina pectoris: Secondary | ICD-10-CM

## 2020-12-25 DIAGNOSIS — Z9861 Coronary angioplasty status: Secondary | ICD-10-CM

## 2020-12-25 DIAGNOSIS — E1151 Type 2 diabetes mellitus with diabetic peripheral angiopathy without gangrene: Secondary | ICD-10-CM

## 2020-12-25 MED ORDER — METFORMIN HCL ER 750 MG PO TB24: 750.0000 mg | ORAL_TABLET | Freq: Two times a day (BID) | ORAL | 2 refills | Status: DC

## 2020-12-25 NOTE — Progress Notes (Signed)
Cardiology Office Note:    Date:  12/25/2020   ID:  [name], DOB 1946-08-14, MRN [id number]  PCP:  Vivi Barrack, MD  [facility] Cardiologist:  Sherren Mocha, MD  [facility] Electrophysiologist:  None   Chief Complaint: yearly folllow up   History of Present Illness:    [name] is a 75 y.o. male with a hx of CAD, DM2, HLD and HTN seen for follow up.   Hx of CAD s/p anterior STEMI in 2016 (out of hospital arrest) due to total occlusion of proximal LAD treated with DES, initially severe LV dysfunction, improved with revascularization.  He was doing well when last seen by me 12/2019.  Here today for yearly follow-up.  Has no complaints.  Denies chest pain, shortness of breath, palpitation, dizziness, orthopnea, PND, syncope, lower extremity edema or melena.  At baseline he is very active.  He does outside activities including kayaking, bicycle riding and post mowing lawn.  Past Medical History:  Diagnosis Date  . Allergic rhinitis   . Arthritis   . CAD (coronary artery disease)    a. 11/11/15 - OOH VF arrest 2/2 ant STEMI >> CPR//AED shock x 2 >> ROSC >> LHC: pLAD 100 (Promus DES), mLCx 50, mRCA 30, EF 25% [managed with hypothermia protocol]  . Fractured rib   . GERD (gastroesophageal reflux disease)   . History of Clostridium difficile colitis 10/2015   post MI >> required IV Flagyl and IV Vanc >> DC on po Vanc  . History of kidney stones   . History of lithotripsy   . HLD (hyperlipidemia)   . Hypertension   . Impaired glucose tolerance   . Insomnia   . Ischemic cardiomyopathy    a. EF at [facility] 11/11/15 25%;  b. Echo 11/14/15: EF 50-55%, dist ant and anterior septal HK  . Nephrolithiasis 1968  . Pelvic fracture ([place]) 09/2015  . Pre-diabetes   . Rheumatoid arthritis(714.0) 1998  . ST elevation (STEMI) myocardial infarction involving left anterior descending coronary artery ([place]) 11/11/15   100% LAD, Cardiogenic Shock. -- EF ~20-25%    Past  Surgical History:  Procedure Laterality Date  . CARDIAC CATHETERIZATION N/A 11/11/2015   Procedure: Left Heart Cath and Cors/Grafts Angiography;  Surgeon: Sherren Mocha, MD;  Location: [facility];  Service: Cardiovascular;  Laterality: N/A;  . CARDIAC CATHETERIZATION N/A 11/11/2015   Procedure: Coronary Stent Intervention;  Surgeon: Sherren Mocha, MD;  Location: [facility];  Service: Cardiovascular;  Laterality: N/A;  prox lad 3.5x38 promus  . CORONARY ANGIOPLASTY WITH STENT PLACEMENT  2016  . EXTRACORPOREAL SHOCK WAVE LITHOTRIPSY Left 08/29/2017   Procedure: LEFT EXTRACORPOREAL SHOCK WAVE LITHOTRIPSY (ESWL);  Surgeon: Raynelle Bring, MD;  Location: [facility];  Service: Urology;  Laterality: Left;  . status post multiple lithotripy and urological surgery for reccurent calcium oxalate stones      Current Medications: Current Meds  Medication Sig  . aspirin EC 81 MG tablet Take 81 mg by mouth daily.  Marland Kitchen atorvastatin (LIPITOR) 80 MG tablet TAKE 1 TABLET BY MOUTH  DAILY AT 6PM. Please keep upcoming appt in January 2022 before anymore refills. Thank you  . cetirizine (ZYRTEC) 10 MG tablet Take 10 mg by mouth daily.  . Cholecalciferol (VITAMIN D-3) 1000 UNITS CAPS Take 5,000 Units by mouth daily.   . famotidine (PEPCID) 20 MG tablet Take 1 tablet (20 mg total) by mouth 2 (two) times daily.  . finasteride (PROSCAR) 5 MG tablet Take  5 mg by mouth daily.  . fluticasone (FLONASE) 50 MCG/ACT nasal spray USE 2 SPRAYS IN EACH  NOSTRIL DAILY  . folic acid (FOLVITE) 1 MG tablet Take 1 mg by mouth daily.  . meloxicam (MOBIC) 7.5 MG tablet Take 7.5 mg by mouth 2 (two) times daily.  . metFORMIN (GLUCOPHAGE-XR) 750 MG 24 hr tablet Take 1 tablet (750 mg total) by mouth 2 (two) times daily.  . methotrexate (RHEUMATREX) 2.5 MG tablet Take 15 mg by mouth once a week. Caution:Chemotherapy. Protect from light.  . metoprolol tartrate (LOPRESSOR) 25 MG tablet Take 0.5 tablets (12.5 mg total) by mouth 2  (two) times daily.  . nitroGLYCERIN (NITROSTAT) 0.4 MG SL tablet Place 1 tablet under the tongue as needed. Every 5 minutes for chest pain  . Probiotic Product (PRO-BIOTIC BLEND PO) Take 1 tablet by mouth daily.  . SODIUM FLUORIDE 5000 PPM 1.1 % PSTE Take by mouth as directed.  . traMADol (ULTRAM) 50 MG tablet Take 50 mg by mouth every 6 (six) hours as needed for moderate pain.  . traZODone (DESYREL) 50 MG tablet Take 1 tablet (50 mg total) by mouth at bedtime.     Allergies:   Penicillins, Contrast media [iodinated diagnostic agents], Sulfamethoxazole, and Ibuprofen   Social History   Socioeconomic History  . Marital status: Married    Spouse name: Not on file  . Number of children: Not on file  . Years of education: Not on file  . Highest education level: Not on file  Occupational History  . Occupation: retired  Tobacco Use  . 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 Smokeless tobacco: Never Used  . Tobacco comment: quit 30 yrs ago  Vaping Use  . Vaping Use: Never used  Substance and Sexual Activity  . Alcohol use: No  . Drug use: No  . Sexual activity: Not on file  Other Topics Concern  . Not on file  Social History Narrative  . Not on file   Social Determinants of Health   Financial Resource Strain: Low Risk   . Difficulty of Paying Living Expenses: Not hard at all  Food Insecurity: No Food Insecurity  . Worried About Charity fundraiser in the Last Year: Never true  . Ran Out of Food in the Last Year: Never true  Transportation Needs: No Transportation Needs  . Lack of Transportation (Medical): No  . Lack of Transportation (Non-Medical): No  Physical Activity: Sufficiently Active  . Days of Exercise per Week: 5 days  . Minutes of Exercise per Session: 60 min  Stress: No Stress Concern Present  . Feeling of Stress : Not at all  Social Connections: Socially Isolated  . Frequency of Communication with Friends and Family: Once a week  . Frequency of Social Gatherings with  Friends and Family: Never  . Attends Religious Services: Never  . Active Member of Clubs or Organizations: No  . Attends Archivist Meetings: Never  . Marital Status: Married     Family History: The patient's family history includes Cancer in his mother; Diabetes in his father.    ROS:   Please see the history of present illness.    All other systems reviewed and are negative.   EKGs/Labs/Other Studies Reviewed:    The following studies were reviewed today: Cardiac Cath 11/11/15 Left Anterior Descending  Prox LAD lesion 100% stenosed  Thrombotic.  Left Circumflex  Mid Cx lesion 50% stenosed  Mid Cx lesion.  Right Coronary Artery  Mid  RCA lesion 30% stenosed  Mid RCA lesion.  Intervention  Prox LAD lesion  PCI  There is no pre-interventional antegrade distal flow (TIMI 0). Pre-stent angioplasty was performed. A drug-eluting stent was placed. Post-stent angioplasty was performed. The post-interventional distal flow is normal (TIMI 3). The intervention was successful. No complications occurred at this lesion. The proximal LAD is 100% occluded with TIMI-0 flow. Heparin and aggrastat are used for systemic anticoagulation. Brilinta 180 mg is administered via the OG tube. An XB-LAD guide is used and a Buyer, retail is used to cross the occlusion. The LAD is treated with a 2.5x12 mm balloon on multiple inflations. Angiography shows severe diffuse proximal LAD stenosis post-angioplasty. The vessel is stented with a 3.5x38 mm Promus DES and post-dilated with a 3.75 mm [place] balloon.  There is a 0% residual stenosis post intervention.       Post-Intervention Diagram              EKG:  EKG is ordered today.  The ekg ordered today demonstrates normal sinus rhythm at rate of 70 bpm.  Recent Labs: No results found for requested labs within last 8760 hours.  Recent Lipid Panel    Component Value Date/Time   CHOL 114 07/03/2019 0950   TRIG 190 (H) 07/03/2019 0950    HDL 37 (L) 07/03/2019 0950   CHOLHDL 3.1 07/03/2019 0950   CHOLHDL 3 06/29/2018 1153   VLDL 42.8 (H) 06/29/2018 1153   LDLCALC 39 07/03/2019 0950   LDLDIRECT 62.0 06/29/2018 1153      Physical Exam:    VS:  BP 132/70   Pulse 70   Ht 5\' 10"  (1.778 m)   Wt 180 lb (81.6 kg)   SpO2 97%   BMI 25.83 kg/m     Wt Readings from Last 3 Encounters:  12/25/20 180 lb (81.6 kg)  08/13/20 177 lb 6.4 oz (80.5 kg)  12/27/19 183 lb 6.4 oz (83.2 kg)     GEN: Well nourished, well developed in no acute distress HEENT: Normal NECK: No JVD; No carotid bruits LYMPHATICS: No lymphadenopathy CARDIAC: RRR, no murmurs, rubs, gallops RESPIRATORY:  Clear to auscultation without rales, wheezing or rhonchi  ABDOMEN: Soft, non-tender, non-distended MUSCULOSKELETAL:  No edema; No deformity  SKIN: Warm and dry NEUROLOGIC:  Alert and oriented x 3 PSYCHIATRIC:  Normal affect   ASSESSMENT AND PLAN:   1. CAD s/p DES to pLAD (otherise 50% mCx and 30% mRCA) No angina. Continue exercise. Continue ASA, statin and BB.   2. HTN - BP stable on current medications.   3. HLD - No results found for requested labs within last 8760 hours.  - Followed by PCP - Continue statin  4. DM - Last A1c 6.4 - followed by PCP  Medication Adjustments/Labs and Tests Ordered: Current medicines are reviewed at length with the patient today.  Concerns regarding medicines are outlined above.  Orders Placed This Encounter  Procedures  . EKG 12-Lead   No orders of the defined types were placed in this encounter.   Patient Instructions  Medication Instructions:  Your physician recommends that you continue on your current medications as directed. Please refer to the Current Medication list given to you today.  *If you need a refill on your cardiac medications before your next appointment, please call your pharmacy*   Lab Work: None ordered  If you have labs (blood work) drawn today and your tests are completely  normal, you will receive your results only by: Marland Kitchen MyChart  Message (if you have MyChart) OR . A paper copy in the mail If you have any lab test that is abnormal or we need to change your treatment, we will call you to review the results.   Testing/Procedures: None ordered   Follow-Up: At [facility], you and your health needs are our priority.  As part of our continuing mission to provide you with exceptional heart care, we have created designated Provider Care Teams.  These Care Teams include your primary Cardiologist (physician) and Advanced Practice Providers (APPs -  Physician Assistants and Nurse Practitioners) who all work together to provide you with the care you need, when you need it.  We recommend signing up for the patient portal called "MyChart".  Sign up information is provided on this After Visit Summary.  MyChart is used to connect with patients for Virtual Visits (Telemedicine).  Patients are able to view lab/test results, encounter notes, upcoming appointments, etc.  Non-urgent messages can be sent to your provider as well.   To learn more about what you can do with MyChart, go to [email or web address].    Your next appointment:   12 month(s)  The format for your next appointment:   In Person  Provider:   Robbie Lis, PA-C   Other Instructions      Signed, Leanor Kail, [place]  12/25/2020 11:24 AM    [place]

## 2020-12-25 NOTE — Patient Instructions (Signed)
Medication Instructions:  Your physician recommends that you continue on your current medications as directed. Please refer to the Current Medication list given to you today.  *If you need a refill on your cardiac medications before your next appointment, please call your pharmacy*   Lab Work: None ordered  If you have labs (blood work) drawn today and your tests are completely normal, you will receive your results only by: Marland Kitchen MyChart Message (if you have MyChart) OR . A paper copy in the mail If you have any lab test that is abnormal or we need to change your treatment, we will call you to review the results.   Testing/Procedures: None ordered   Follow-Up: At [facility], you and your health needs are our priority.  As part of our continuing mission to provide you with exceptional heart care, we have created designated Provider Care Teams.  These Care Teams include your primary Cardiologist (physician) and Advanced Practice Providers (APPs -  Physician Assistants and Nurse Practitioners) who all work together to provide you with the care you need, when you need it.  We recommend signing up for the patient portal called "MyChart".  Sign up information is provided on this After Visit Summary.  MyChart is used to connect with patients for Virtual Visits (Telemedicine).  Patients are able to view lab/test results, encounter notes, upcoming appointments, etc.  Non-urgent messages can be sent to your provider as well.   To learn more about what you can do with MyChart, go to [email or web address].    Your next appointment:   12 month(s)  The format for your next appointment:   In Person  Provider:   Robbie Lis, PA-C   Other Instructions

## 2021-01-22 ENCOUNTER — Other Ambulatory Visit: Payer: Self-pay

## 2021-01-22 DIAGNOSIS — I10 Essential (primary) hypertension: Secondary | ICD-10-CM

## 2021-01-22 MED ORDER — METOPROLOL TARTRATE 25 MG PO TABS: 12.5000 mg | ORAL_TABLET | Freq: Two times a day (BID) | ORAL | 3 refills | Status: DC

## 2021-01-28 ENCOUNTER — Other Ambulatory Visit: Payer: Self-pay

## 2021-01-28 MED ORDER — ATORVASTATIN CALCIUM 80 MG PO TABS: ORAL_TABLET | ORAL | 3 refills | Status: DC

## 2021-02-09 DIAGNOSIS — E663 Overweight: Secondary | ICD-10-CM | POA: Diagnosis not present

## 2021-02-09 DIAGNOSIS — M0589 Other rheumatoid arthritis with rheumatoid factor of multiple sites: Secondary | ICD-10-CM | POA: Diagnosis not present

## 2021-02-09 DIAGNOSIS — M150 Primary generalized (osteo)arthritis: Secondary | ICD-10-CM | POA: Diagnosis not present

## 2021-02-09 DIAGNOSIS — M25511 Pain in right shoulder: Secondary | ICD-10-CM | POA: Diagnosis not present

## 2021-02-09 DIAGNOSIS — Z6826 Body mass index (BMI) 26.0-26.9, adult: Secondary | ICD-10-CM | POA: Diagnosis not present

## 2021-02-11 ENCOUNTER — Ambulatory Visit (INDEPENDENT_AMBULATORY_CARE_PROVIDER_SITE_OTHER): Payer: HMO | Admitting: Family Medicine

## 2021-02-11 ENCOUNTER — Encounter: Payer: Self-pay | Admitting: Family Medicine

## 2021-02-11 ENCOUNTER — Other Ambulatory Visit: Payer: Self-pay

## 2021-02-11 VITALS — BP 138/70 | HR 70 | Temp 98.1°F | Ht 70.0 in | Wt 181.0 lb

## 2021-02-11 DIAGNOSIS — I152 Hypertension secondary to endocrine disorders: Secondary | ICD-10-CM | POA: Diagnosis not present

## 2021-02-11 DIAGNOSIS — E1159 Type 2 diabetes mellitus with other circulatory complications: Secondary | ICD-10-CM | POA: Diagnosis not present

## 2021-02-11 DIAGNOSIS — E1151 Type 2 diabetes mellitus with diabetic peripheral angiopathy without gangrene: Secondary | ICD-10-CM | POA: Diagnosis not present

## 2021-02-11 LAB — POCT GLYCOSYLATED HEMOGLOBIN (HGB A1C): Hemoglobin A1C: 6.8 % — AB (ref 4.0–5.6)

## 2021-02-11 NOTE — Progress Notes (Signed)
   [name] is a 75 y.o. male who presents today for an office visit.  Assessment/Plan:  Chronic Problems Addressed Today: DM (diabetes mellitus), type 2 with peripheral vascular complications ([name]) X2K at goal 1t 6.8 on metformin 750mg  twice daily. Discussed lifestyle modifications. Follow up in 6 months.   Hypertension associated with diabetes ([place]) At goal on lopressor 12.5mg  twice daily.      Subjective:  HPI:  See A/p.         Objective:  Physical Exam: BP 138/70   Pulse 70   Temp 98.1 F (36.7 C) (Temporal)   Ht 5\' 10"  (1.778 m)   Wt 181 lb (82.1 kg)   SpO2 96%   BMI 25.97 kg/m   Gen: No acute distress, resting comfortably CV: Regular rate and rhythm with no murmurs appreciated Pulm: Normal work of breathing, clear to auscultation bilaterally with no crackles, wheezes, or rhonchi Neuro: Grossly normal, moves all extremities Psych: Normal affect and thought content      Giovani Neumeister M. Jerline Pain, MD 02/11/2021 10:27 AM

## 2021-02-11 NOTE — Assessment & Plan Note (Signed)
A1c at goal 1t 6.8 on metformin 750mg  twice daily. Discussed lifestyle modifications. Follow up in 6 months.

## 2021-02-11 NOTE — Patient Instructions (Signed)
It was very nice to see you today!  Your A1c is 6.8.   Keep up the good work.  I will see you back in 6 months.  Please come back sooner if needed.   Take care, Dr Jerline Pain  Please try these tips to maintain a healthy lifestyle:   Eat at least 3 REAL meals and 1-2 snacks per day.  Aim for no more than 5 hours between eating.  If you eat breakfast, please do so within one hour of getting up.    Each meal should contain half fruits/vegetables, one quarter protein, and one quarter carbs (no bigger than a computer mouse)   Cut down on sweet beverages. This includes juice, soda, and sweet tea.     Drink at least 1 glass of water with each meal and aim for at least 8 glasses per day   Exercise at least 150 minutes every week.

## 2021-02-11 NOTE — Assessment & Plan Note (Signed)
At goal on lopressor 12.5mg  twice daily.

## 2021-02-12 DIAGNOSIS — H40013 Open angle with borderline findings, low risk, bilateral: Secondary | ICD-10-CM | POA: Diagnosis not present

## 2021-02-12 DIAGNOSIS — H40053 Ocular hypertension, bilateral: Secondary | ICD-10-CM | POA: Diagnosis not present

## 2021-03-06 ENCOUNTER — Encounter: Payer: Self-pay | Admitting: Family Medicine

## 2021-03-19 ENCOUNTER — Encounter: Payer: Self-pay | Admitting: Family Medicine

## 2021-03-19 NOTE — Telephone Encounter (Signed)
Please advise 

## 2021-04-29 DIAGNOSIS — R351 Nocturia: Secondary | ICD-10-CM | POA: Diagnosis not present

## 2021-04-29 DIAGNOSIS — N200 Calculus of kidney: Secondary | ICD-10-CM | POA: Diagnosis not present

## 2021-04-29 DIAGNOSIS — N401 Enlarged prostate with lower urinary tract symptoms: Secondary | ICD-10-CM | POA: Diagnosis not present

## 2021-05-07 DIAGNOSIS — M0589 Other rheumatoid arthritis with rheumatoid factor of multiple sites: Secondary | ICD-10-CM | POA: Diagnosis not present

## 2021-07-09 ENCOUNTER — Telehealth (INDEPENDENT_AMBULATORY_CARE_PROVIDER_SITE_OTHER): Payer: HMO | Admitting: Family Medicine

## 2021-07-09 DIAGNOSIS — U071 COVID-19: Secondary | ICD-10-CM | POA: Diagnosis not present

## 2021-07-09 MED ORDER — NIRMATRELVIR/RITONAVIR (PAXLOVID)TABLET: 3.0000 | ORAL_TABLET | Freq: Two times a day (BID) | ORAL | 0 refills | Status: AC

## 2021-07-09 MED ORDER — BENZONATATE 100 MG PO CAPS: 100.0000 mg | ORAL_CAPSULE | Freq: Three times a day (TID) | ORAL | 0 refills | Status: DC | PRN

## 2021-07-09 NOTE — Patient Instructions (Addendum)
HOME CARE TIPS:  -[place] testing information: [email or web address].[email or web address]-[email or web address] OR [phone] Most pharmacies also offer testing and home test kits. If the Covid19 test is positive, please make a prompt follow up visit with your primary care office or with [facility] to discuss treatment options. Treatments for Covid19 are best given early in the course of the illness.   -I sent the medication(s) we discussed to your pharmacy: Meds ordered this encounter  Medications   nirmatrelvir/ritonavir EUA (PAXLOVID) TABS    Sig: Take 3 tablets by mouth 2 (two) times daily for 5 days. (Take nirmatrelvir 150 mg two tablets twice daily for 5 days and ritonavir 100 mg one tablet twice daily for 5 days) Patient GFR is 68    Dispense:  30 tablet    Refill:  0   benzonatate (TESSALON PERLES) 100 MG capsule    Sig: Take 1 capsule (100 mg total) by mouth 3 (three) times daily as needed.    Dispense:  20 capsule    Refill:  0     -I sent in the [place] treatment or referral you requested per our discussion. Please see the information provided below and discuss further with the pharmacist/treatment team.  -Do not take the Tramadol while taking the Paxlovid.  -Take 1/2 dose of the Trazadone while taking the Paxlovid. N -otify your rheumatologist of treatment.  -Please STOP you lipitor (atorvastatin) while on the Paxlovid and restart 3 days after you finish the covid treatment.   -can use tylenol if needed for fevers, aches and pains per instructions  -can use nasal saline a few times per day if you have nasal congestion  -stay hydrated, drink plenty of fluids and eat small healthy meals - avoid dairy  -check out the [facility] website for more information on home care, transmission and treatment for COVID19  -follow up with your doctor in 2-3 days unless improving and feeling better  -stay home while sick, except to seek medical care. If you have COVID19, ideally it  would be best to stay home for a full 10 days since the onset of symptoms PLUS one day of no fever and feeling better. Wear a good mask that fits snugly (such as N95 or KN95) if around others to reduce the risk of transmission.  It was nice to meet you today, and I really hope you are feeling better soon. I help [facility] out with telemedicine visits on Tuesdays and Thursdays and am available for visits on those days. If you have any concerns or questions following this visit please schedule a follow up visit with your Primary Care doctor or seek care at a local urgent care clinic to avoid delays in care.    Seek in person care or schedule a follow up video visit promptly if your symptoms worsen, new concerns arise or you are not improving with treatment. Call 911 and/or seek emergency care if your symptoms are severe or life threatening.  FACT SHEET FOR PATIENTS, PARENTS, AND CAREGIVERS EMERGENCY USE AUTHORIZATION (EUA) OF PAXLOVID FOR CORONAVIRUS DISEASE 2019 (COVID-19) You are being given this Fact Sheet because your healthcare provider believes it is necessary to provide you with PAXLOVID for the treatment of mild-to-moderate coronavirus disease (COVID-19) caused by the SARS-CoV-2 virus. This Fact Sheet contains information to help you understand the risks and benefits of taking the PAXLOVID you have received or may receive. The U.S. Food and Drug Administration (FDA) has issued an Emergency Use Authorization (EUA) to make PAXLOVID available during the COVID-19  pandemic (for more details about an EUA please see "What is an Emergency Use Authorization?" at the end of this document). PAXLOVID is not an FDA-approved medicine in the [place]. Read this Fact Sheet for information about PAXLOVID. Talk to your healthcare provider about your options or if you have any questions. It is your choice to take PAXLOVID.  What is COVID-19? COVID-19 is caused by a virus called a coronavirus. You  can get COVID-19 through close contact with another person who has the virus. COVID-19 illnesses have ranged from very mild-to-severe, including illness resulting in death. While information so far suggests that most COVID-19 illness is mild, serious illness can happen and may cause some of your other medical conditions to become worse. Older people and people of all ages with severe, long lasting (chronic) medical conditions like heart disease, lung disease, and diabetes, for example seem to be at higher risk of being hospitalized for COVID-19.  What is PAXLOVID? PAXLOVID is an investigational medicine used to treat mild-to-moderate COVID-19 in adults and children [76 years of age and older weighing at least 8 pounds (88 kg)] with positive results of direct SARS-CoV-2 viral testing, and who are at high risk for progression to severe COVID-19, including hospitalization or death. PAXLOVID is investigational because it is still being studied. There is limited information about the safety and effectiveness of using PAXLOVID to treat people with mild-to-moderate COVID-19.  The FDA has authorized the emergency use of PAXLOVID for the treatment of mild-tomoderate COVID-19 in adults and children [61 years of age and older weighing at least 64 pounds (23 kg)] with a positive test for the virus that causes COVID-19, and who are at high risk for progression to severe COVID-19, including hospitalization or death, under an EUA. 1 Revised: 27 February 2021   What should I tell my healthcare provider before I take PAXLOVID? Tell your healthcare provider if you: ? Have any allergies ? Have liver or kidney disease ? Are pregnant or plan to become pregnant ? Are breastfeeding a child ? Have any serious illnesses  Tell your healthcare provider about all the medicines you take, including prescription and over-the-counter medicines, vitamins, and herbal supplements. Some medicines may interact with  PAXLOVID and may cause serious side effects. Keep a list of your medicines to show your healthcare provider and pharmacist when you get a new medicine.  You can ask your healthcare provider or pharmacist for a list of medicines that interact with PAXLOVID. Do not start taking a new medicine without telling your healthcare provider. Your healthcare provider can tell you if it is safe to take PAXLOVID with other medicines.  Tell your healthcare provider if you are taking combined hormonal contraceptive. PAXLOVID may affect how your birth control pills work. Females who are able to become pregnant should use another effective alternative form of contraception or an additional barrier method of contraception. Talk to your healthcare provider if you have any questions about contraceptive methods that might be right for you.  How do I take PAXLOVID? ? PAXLOVID consists of 2 medicines: nirmatrelvir and ritonavir. o Take 2 pink tablets of nirmatrelvir with 1 white tablet of ritonavir by mouth 2 times each day (in the morning and in the evening) for 5 days. For each dose, take all 3 tablets at the same time. o If you have kidney disease, talk to your healthcare provider. You may need a different dose. ? Swallow the tablets whole. Do not chew, break, or crush the tablets. ?  Take PAXLOVID with or without food. ? Do not stop taking PAXLOVID without talking to your healthcare provider, even if you feel better. ? If you miss a dose of PAXLOVID within 8 hours of the time it is usually taken, take it as soon as you remember. If you miss a dose by more than 8 hours, skip the missed dose and take the next dose at your regular time. Do not take 2 doses of PAXLOVID at the same time. ? If you take too much PAXLOVID, call your healthcare provider or go to the nearest hospital emergency room right away. ? If you are taking a ritonavir- or cobicistat-containing medicine to treat hepatitis C or Human  Immunodeficiency Virus (HIV), you should continue to take your medicine as prescribed by your healthcare provider. 2 Revised: 27 February 2021    Talk to your healthcare provider if you do not feel better or if you feel worse after 5 days.  Who should generally not take PAXLOVID? Do not take PAXLOVID if: ? You are allergic to nirmatrelvir, ritonavir, or any of the ingredients in PAXLOVID. ? You are taking any of the following medicines: o Alfuzosin o Pethidine, propoxyphene o Ranolazine o Amiodarone, dronedarone, flecainide, propafenone, quinidine o Colchicine o Lurasidone, pimozide, clozapine o Dihydroergotamine, ergotamine, methylergonovine o Lovastatin, simvastatin o Sildenafil (Revatio) for pulmonary arterial hypertension (PAH) o Triazolam, oral midazolam o Apalutamide o Carbamazepine, phenobarbital, phenytoin o Rifampin o St. John's Wort (hypericum perforatum) Taking PAXLOVID with these medicines may cause serious or life-threatening side effects or affect how PAXLOVID works.  These are not the only medicines that may cause serious side effects if taken with PAXLOVID. PAXLOVID may increase or decrease the levels of multiple other medicines. It is very important to tell your healthcare provider about all of the medicines you are taking because additional laboratory tests or changes in the dose of your other medicines may be necessary while you are taking PAXLOVID. Your healthcare provider may also tell you about specific symptoms to watch out for that may indicate that you need to stop or decrease the dose of some of your other medicines.  What are the important possible side effects of PAXLOVID? Possible side effects of PAXLOVID are: ? Allergic Reactions. Allergic reactions can happen in people taking PAXLOVID, even after only 1 dose. Stop taking PAXLOVID and call your healthcare provider right away if you get any of the following symptoms of an allergic reaction: o  hives o trouble swallowing or breathing o swelling of the mouth, lips, or face o throat tightness o hoarseness 3 Revised: 27 February 2021  o skin rash ? Liver Problems. Tell your healthcare provider right away if you have any of these signs and symptoms of liver problems: loss of appetite, yellowing of your skin and the whites of eyes (jaundice), dark-colored urine, pale colored stools and itchy skin, stomach area (abdominal) pain. ? Resistance to HIV Medicines. If you have untreated HIV infection, PAXLOVID may lead to some HIV medicines not working as well in the future. ? Other possible side effects include: o altered sense of taste o diarrhea o high blood pressure o muscle aches These are not all the possible side effects of PAXLOVID. Not many people have taken PAXLOVID. Serious and unexpected side effects may happen. PAXLOVID is still being studied, so it is possible that all of the risks are not known at this time.  What other treatment choices are there? Veklury (remdesivir) is FDA-approved for the treatment of mild-to-moderate COVID-19  in certain adults and children. Talk with your doctor to see if Marijean Heath is appropriate for you. Like PAXLOVID, FDA may also allow for the emergency use of other medicines to treat people with COVID-19. Go to https://price.info/ for information on the emergency use of other medicines that are authorized by FDA to treat people with COVID-19. Your healthcare provider may talk with you about clinical trials for which you may be eligible. It is your choice to be treated or not to be treated with PAXLOVID. Should you decide not to receive it or for your child not to receive it, it will not change your standard medical care.  What if I am pregnant or breastfeeding? There is no experience treating pregnant women or breastfeeding mothers  with PAXLOVID. For a mother and unborn baby, the benefit of taking PAXLOVID may be greater than the risk from the treatment. If you are pregnant, discuss your options and specific situation with your healthcare provider. It is recommended that you use effective barrier contraception or do not have sexual activity while taking PAXLOVID. If you are breastfeeding, discuss your options and specific situation with your healthcare provider. 4 Revised: 27 February 2021   How do I report side effects with PAXLOVID? Contact your healthcare provider if you have any side effects that bother you or do not go away. Report side effects to FDA MedWatch at SmoothHits.hu or call 1-800-FDA1088 or you can report side effects to [employer]. at the contact information provided below. Website Fax number Telephone number www.pfizersafetyreporting.com [phone] How should I store Captiva? Store PAXLOVID tablets at room temperature, between 68?F to 77?F (20?C to 25?C). How can I learn more about COVID-19? ? Ask your healthcare provider. ? Visit [email or web address]-johnson.[email or web address] ? Contact your local or state public health department. What is an Emergency Use Authorization (EUA)? The [place] FDA has made PAXLOVID available under an emergency access mechanism called an Emergency Use Authorization (EUA). The EUA is supported by a Education officer, museum and Human Service (HHS) declaration that circumstances exist to justify the emergency use of drugs and biological products during the COVID-19 pandemic. PAXLOVID for the treatment of mild-to-moderate COVID-19 in adults and children [42 years of age and older weighing at least 18 pounds (6 kg)] with positive results of direct SARS-CoV-2 viral testing, and who are at high risk for progression to severe COVID-19, including hospitalization or death, has not undergone the same type of review as an FDA-approved product. In issuing an EUA under  the MCNOB-09 public health emergency, the FDA has determined, among other things, that based on the total amount of scientific evidence available including data from adequate and well-controlled clinical trials, if available, it is reasonable to believe that the product may be effective for diagnosing, treating, or preventing COVID-19, or a serious or life-threatening disease or condition caused by COVID-19; that the known and potential benefits of the product, when used to diagnose, treat, or prevent such disease or condition, outweigh the known and potential risks of such product; and that there are no adequate, approved, and available alternatives. All of these criteria must be met to allow for the product to be used in the treatment of patients during the COVID-19 pandemic. The EUA for PAXLOVID is in effect for the duration of the COVID-19 declaration justifying emergency use of this product, unless terminated or revoked (after which the products may no longer be used under the EUA). 5 Revised: 27 February 2021     Additional Information For general  questions, visit the website or call the telephone number provided below. Website Telephone number www.COVID19oralRx.com [phone] (1-877-C19-PACK) You can also go to www.pfizermedinfo.com or call [phone] for more information. [id number] Revised: 27 February 2021

## 2021-07-09 NOTE — Progress Notes (Addendum)
Virtual Visit via Video Note  I connected with [name]  on 07/09/21 at  1:00 PM EDT by a video enabled telemedicine application and verified that I am speaking with the correct person using two identifiers.  Location patient: home, [place] Location provider:work or home office Persons participating in the virtual visit: patient, provider  I discussed the limitations of evaluation and management by telemedicine and the availability of in person appointments. The patient expressed understanding and agreed to proceed.   HPI: Acute telemedicine visit for Covid19: -Onset: 3 days ago -Symptoms include: scratchy throat, low grade temp, diarrhea,  -Denies:Cp, SOB, NV, inability to eat/drink/get out of bed -Pertinent past medical history:see below -Pertinent medication allergies: Allergies  Allergen Reactions   Penicillins Anaphylaxis   Contrast Media [Iodinated Diagnostic Agents] Hives    03/28/14 pt received 1 hour emergent prep and pt did good and had no complaints after scan when given IV benadryl first. Had reaction 1 time, tolerated fine in 03/2014 with Benadryl   Sulfamethoxazole Hives    Childhood reaction   Ibuprofen Swelling    Happened 1 time  -COVID-19 vaccine status: has had both covid shots and a booster -he reports he had recent labs with his rheumatologist 05/08/21 - he showed me this report during the visit with eGFR 68, LFTs looked good as well -he reports he is not taking tramadol   ROS: See pertinent positives and negatives per HPI.  Past Medical History:  Diagnosis Date   Allergic rhinitis    Arthritis    CAD (coronary artery disease)    a. 11/11/15 - OOH VF arrest 2/2 ant STEMI >> CPR//AED shock x 2 >> ROSC >> LHC: pLAD 100 (Promus DES), mLCx 50, mRCA 30, EF 25% [managed with hypothermia protocol]   Fractured rib    GERD (gastroesophageal reflux disease)    History of Clostridium difficile colitis 10/2015   post MI >> required IV Flagyl and IV Vanc >> DC on po Vanc    History of kidney stones    History of lithotripsy    HLD (hyperlipidemia)    Hypertension    Impaired glucose tolerance    Insomnia    Ischemic cardiomyopathy    a. EF [facility] 11/11/15 25%;  b. Echo 11/14/15: EF 50-55%, dist ant and anterior septal HK   Nephrolithiasis 1968   Pelvic fracture ([place]) 09/2015   Pre-diabetes    Rheumatoid arthritis(714.0) 1998   ST elevation (STEMI) myocardial infarction involving left anterior descending coronary artery ([place]) 11/11/15   100% LAD, Cardiogenic Shock. -- EF ~20-25%    Past Surgical History:  Procedure Laterality Date   CARDIAC CATHETERIZATION N/A 11/11/2015   Procedure: Left Heart Cath and Cors/Grafts Angiography;  Surgeon: Sherren Mocha, MD;  Location: [place] [facility];  Service: Cardiovascular;  Laterality: N/A;   CARDIAC CATHETERIZATION N/A 11/11/2015   Procedure: Coronary Stent Intervention;  Surgeon: Sherren Mocha, MD;  Location: [place] [facility];  Service: Cardiovascular;  Laterality: N/A;  prox lad 3.5x38 promus   CORONARY ANGIOPLASTY WITH STENT PLACEMENT  2016   EXTRACORPOREAL SHOCK WAVE LITHOTRIPSY Left 08/29/2017   Procedure: LEFT EXTRACORPOREAL SHOCK WAVE LITHOTRIPSY (ESWL);  Surgeon: Raynelle Bring, MD;  Location: [facility];  Service: Urology;  Laterality: Left;   status post multiple lithotripy and urological surgery for reccurent calcium oxalate stones       Current Outpatient Medications:    benzonatate (TESSALON PERLES) 100 MG capsule, Take 1 capsule (100 mg total) by mouth 3 (three) times daily  as needed., Disp: 20 capsule, Rfl: 0   nirmatrelvir/ritonavir EUA (PAXLOVID) TABS, Take 3 tablets by mouth 2 (two) times daily for 5 days. (Take nirmatrelvir 150 mg two tablets twice daily for 5 days and ritonavir 100 mg one tablet twice daily for 5 days) Patient GFR is 68, Disp: 30 tablet, Rfl: 0   aspirin EC 81 MG tablet, Take 81 mg by mouth daily., Disp: , Rfl:    atorvastatin (LIPITOR) 80 MG tablet, TAKE 1 TABLET BY MOUTH  DAILY  AT 6PM., Disp: 90 tablet, Rfl: 3   cetirizine (ZYRTEC) 10 MG tablet, Take 10 mg by mouth daily., Disp: , Rfl:    Cholecalciferol (VITAMIN D-3) 1000 UNITS CAPS, Take 5,000 Units by mouth daily. , Disp: , Rfl:    famotidine (PEPCID) 20 MG tablet, Take 1 tablet (20 mg total) by mouth 2 (two) times daily., Disp: 180 tablet, Rfl: 0   finasteride (PROSCAR) 5 MG tablet, Take 5 mg by mouth daily., Disp: , Rfl:    fluticasone (FLONASE) 50 MCG/ACT nasal spray, USE 2 SPRAYS IN EACH  NOSTRIL DAILY, Disp: 48 g, Rfl: 3   folic acid (FOLVITE) 1 MG tablet, Take 1 mg by mouth daily., Disp: , Rfl:    meloxicam (MOBIC) 7.5 MG tablet, Take 7.5 mg by mouth 2 (two) times daily., Disp: , Rfl:    metFORMIN (GLUCOPHAGE-XR) 750 MG 24 hr tablet, Take 1 tablet (750 mg total) by mouth 2 (two) times daily., Disp: 180 tablet, Rfl: 2   methotrexate (RHEUMATREX) 2.5 MG tablet, Take 15 mg by mouth once a week. Caution:Chemotherapy. Protect from light., Disp: , Rfl:    metoprolol tartrate (LOPRESSOR) 25 MG tablet, Take 0.5 tablets (12.5 mg total) by mouth 2 (two) times daily., Disp: 90 tablet, Rfl: 3   nitroGLYCERIN (NITROSTAT) 0.4 MG SL tablet, Place 1 tablet under the tongue as needed. Every 5 minutes for chest pain, Disp: , Rfl: 3   Probiotic Product (PRO-BIOTIC BLEND PO), Take 1 tablet by mouth daily., Disp: , Rfl:    SODIUM FLUORIDE 5000 PPM 1.1 % PSTE, Take by mouth as directed., Disp: , Rfl:    traMADol (ULTRAM) 50 MG tablet, Take 50 mg by mouth every 6 (six) hours as needed for moderate pain., Disp: , Rfl:    traZODone (DESYREL) 50 MG tablet, Take 1 tablet (50 mg total) by mouth at bedtime., Disp: 90 tablet, Rfl: 3  EXAM:  VITALS per patient if applicable:  GENERAL: alert, oriented, appears well and in no acute distress  HEENT: atraumatic, conjunttiva clear, no obvious abnormalities on inspection of external nose and ears  NECK: normal movements of the head and neck  LUNGS: on inspection no signs of respiratory  distress, breathing rate appears normal, no obvious gross SOB, gasping or wheezing  CV: no obvious cyanosis  MS: moves all visible extremities without noticeable abnormality  PSYCH/NEURO: pleasant and cooperative, no obvious depression or anxiety, speech and thought processing grossly intact  ASSESSMENT AND PLAN:  Discussed the following assessment and plan:  COVID-19   Discussed treatment options, ideal treatment window, potential complications, isolation and precautions for COVID-19.  After lengthy discussion, the patient opted for treatment with Paxlovid due to being higher risk for complications of covid or severe disease and other factors. Discussed EUA status of this drug and the fact that there is preliminary limited knowledge of risks/interactions/side effects per EUA document vs possible benefits and precautions. He agrees to avoid his tramadol while taking the pack COVID, reports he  rarely takes this anyways.  He is on a low-dose of trazodone and plans to half this dose while he is taking the Paxil COVID.  He also plans to discuss the pack COVID and his other medications with his pharmacist and let his rheumatologist know that he is on treatment for COVID.  This information was shared with patient during the visit and also was provided in patient instructions. Also, advised that patient discuss risks/interactions and use with pharmacist/treatment team as well. The patient did want a prescription for cough, Tessalon Rx sent.  Other symptomatic care measures summarized in patient instructions. Addendum: noticed after his visit that he also is on a statin, spoke with patient by phone and also advised to hold the statin.  Advised to seek prompt follow-up with his primary care office or in person care if worsening, new symptoms arise, or if is not improving with treatment. Advised of options for inperson care in case PCP office not available. Did let the patient know that I only do telemedicine  shifts for [place] on Tuesdays and Thursdays and advised a follow up visit with PCP or at an [facility] if has further questions or concerns.  I discussed the assessment and treatment plan with the patient. The patient was provided an opportunity to ask questions and all were answered. The patient agreed with the plan and demonstrated an understanding of the instructions.   Lucretia Kern, DO

## 2021-07-23 ENCOUNTER — Encounter: Payer: Self-pay | Admitting: Family Medicine

## 2021-07-23 ENCOUNTER — Telehealth (INDEPENDENT_AMBULATORY_CARE_PROVIDER_SITE_OTHER): Payer: HMO | Admitting: Family Medicine

## 2021-07-23 VITALS — Temp 98.0°F | Wt 175.0 lb

## 2021-07-23 DIAGNOSIS — U071 COVID-19: Secondary | ICD-10-CM | POA: Diagnosis not present

## 2021-07-23 MED ORDER — BENZONATATE 200 MG PO CAPS: 200.0000 mg | ORAL_CAPSULE | Freq: Two times a day (BID) | ORAL | 0 refills | Status: DC | PRN

## 2021-07-23 NOTE — Patient Instructions (Signed)
-  I sent the medication(s) we discussed to your pharmacy: Meds ordered this encounter  Medications   benzonatate (TESSALON) 200 MG capsule    Sig: Take 1 capsule (200 mg total) by mouth 2 (two) times daily as needed for cough.    Dispense:  20 capsule    Refill:  0     I hope you are feeling better soon!  Seek in person care promptly if your symptoms worsen, new concerns arise or you are not improving with treatment over the next several days.  It was nice to meet you today. I help [place] [facility] out with telemedicine visits on Tuesdays and Thursdays and am available for visits on those days. If you have any concerns or questions following this visit please schedule a follow up visit with your Primary Care doctor or seek care at a local urgent care clinic to avoid delays in care.

## 2021-07-23 NOTE — Progress Notes (Signed)
Virtual Visit via Video Note  I connected with [name]  on 07/23/21 at 10:40 AM EDT by a video enabled telemedicine application and verified that I am speaking with the correct person using two identifiers.  Location patient: home, [place] Location provider:work or home office Persons participating in the virtual visit: patient, provider, patient's wife  I discussed the limitations of evaluation and management by telemedicine and the availability of in person appointments. The patient expressed understanding and agreed to proceed.   HPI:  Acute telemedicine visit for cough: -Onset: with covid about 2 weeks ago -Symptoms include: other symptoms have resolved with the covid - but the cough has persisted - sometimes productive, sometimes dry -reports other than the cough feels pretty good -he was treated with Paxlovid on 07/09/21 -temp is 98 -home test for covid is still positive today -Denies: CP (except ribs hurt when coughing), SOB, fever, vomiting, diarrhea, inability to eat/drink/get out of bed -Has tried:tessalon lower dose -Pertinent past medical history:see below -Pertinent medication allergies:  Allergies  Allergen Reactions   Penicillins Anaphylaxis   Contrast Media [Iodinated Diagnostic Agents] Hives    03/28/14 pt received 1 hour emergent prep and pt did good and had no complaints after scan when given IV benadryl first. Had reaction 1 time, tolerated fine in 03/2014 with Benadryl   Sulfamethoxazole Hives    Childhood reaction   Ibuprofen Swelling    Happened 1 time  -COVID-19 vaccine status: has had both covid doses and a booster  ROS: See pertinent positives and negatives per HPI.  Past Medical History:  Diagnosis Date   Allergic rhinitis    Arthritis    CAD (coronary artery disease)    a. 11/11/15 - OOH VF arrest 2/2 ant STEMI >> CPR//AED shock x 2 >> ROSC >> LHC: pLAD 100 (Promus DES), mLCx 50, mRCA 30, EF 25% [managed with hypothermia protocol]   Fractured rib    GERD  (gastroesophageal reflux disease)    History of Clostridium difficile colitis 10/2015   post MI >> required IV Flagyl and IV Vanc >> DC on po Vanc   History of kidney stones    History of lithotripsy    HLD (hyperlipidemia)    Hypertension    Impaired glucose tolerance    Insomnia    Ischemic cardiomyopathy    a. EF at [facility] 11/11/15 25%;  b. Echo 11/14/15: EF 50-55%, dist ant and anterior septal HK   Nephrolithiasis 1968   Pelvic fracture ([place]) 09/2015   Pre-diabetes    Rheumatoid arthritis(714.0) 1998   ST elevation (STEMI) myocardial infarction involving left anterior descending coronary artery ([place]) 11/11/15   100% LAD, Cardiogenic Shock. -- EF ~20-25%    Past Surgical History:  Procedure Laterality Date   CARDIAC CATHETERIZATION N/A 11/11/2015   Procedure: Left Heart Cath and Cors/Grafts Angiography;  Surgeon: Sherren Mocha, MD;  Location: [facility];  Service: Cardiovascular;  Laterality: N/A;   CARDIAC CATHETERIZATION N/A 11/11/2015   Procedure: Coronary Stent Intervention;  Surgeon: Sherren Mocha, MD;  Location: [facility];  Service: Cardiovascular;  Laterality: N/A;  prox lad 3.5x38 promus   CORONARY ANGIOPLASTY WITH STENT PLACEMENT  2016   EXTRACORPOREAL SHOCK WAVE LITHOTRIPSY Left 08/29/2017   Procedure: LEFT EXTRACORPOREAL SHOCK WAVE LITHOTRIPSY (ESWL);  Surgeon: Raynelle Bring, MD;  Location: [facility];  Service: Urology;  Laterality: Left;   status post multiple lithotripy and urological surgery for reccurent calcium oxalate stones       Current Outpatient Medications:  aspirin EC 81 MG tablet, Take 81 mg by mouth daily., Disp: , Rfl:    atorvastatin (LIPITOR) 80 MG tablet, TAKE 1 TABLET BY MOUTH  DAILY AT 6PM., Disp: 90 tablet, Rfl: 3   benzonatate (TESSALON) 200 MG capsule, Take 1 capsule (200 mg total) by mouth 2 (two) times daily as needed for cough., Disp: 20 capsule, Rfl: 0   cetirizine (ZYRTEC) 10 MG tablet, Take 10 mg by mouth daily., Disp: ,  Rfl:    Cholecalciferol (VITAMIN D-3) 1000 UNITS CAPS, Take 5,000 Units by mouth daily. , Disp: , Rfl:    famotidine (PEPCID) 20 MG tablet, Take 1 tablet (20 mg total) by mouth 2 (two) times daily., Disp: 180 tablet, Rfl: 0   finasteride (PROSCAR) 5 MG tablet, Take 5 mg by mouth daily., Disp: , Rfl:    fluticasone (FLONASE) 50 MCG/ACT nasal spray, USE 2 SPRAYS IN EACH  NOSTRIL DAILY, Disp: 48 g, Rfl: 3   folic acid (FOLVITE) 1 MG tablet, Take 1 mg by mouth daily., Disp: , Rfl:    meloxicam (MOBIC) 7.5 MG tablet, Take 7.5 mg by mouth 2 (two) times daily., Disp: , Rfl:    methotrexate (RHEUMATREX) 2.5 MG tablet, Take 15 mg by mouth once a week. Caution:Chemotherapy. Protect from light., Disp: , Rfl:    metoprolol tartrate (LOPRESSOR) 25 MG tablet, Take 0.5 tablets (12.5 mg total) by mouth 2 (two) times daily., Disp: 90 tablet, Rfl: 3   nitroGLYCERIN (NITROSTAT) 0.4 MG SL tablet, Place 1 tablet under the tongue as needed. Every 5 minutes for chest pain, Disp: , Rfl: 3   Probiotic Product (PRO-BIOTIC BLEND PO), Take 1 tablet by mouth daily., Disp: , Rfl:    SODIUM FLUORIDE 5000 PPM 1.1 % PSTE, Take by mouth as directed., Disp: , Rfl:    traMADol (ULTRAM) 50 MG tablet, Take 50 mg by mouth every 6 (six) hours as needed for moderate pain., Disp: , Rfl:    traZODone (DESYREL) 50 MG tablet, Take 1 tablet (50 mg total) by mouth at bedtime., Disp: 90 tablet, Rfl: 3   metFORMIN (GLUCOPHAGE-XR) 750 MG 24 hr tablet, Take 1 tablet (750 mg total) by mouth 2 (two) times daily., Disp: 180 tablet, Rfl: 2  EXAM:  VITALS per patient if applicable:  GENERAL: alert, oriented, appears well and in no acute distress  HEENT: atraumatic, conjunttiva clear, no obvious abnormalities on inspection of external nose and ears  NECK: normal movements of the head and neck  LUNGS: on inspection no signs of respiratory distress, breathing rate appears normal, no obvious gross SOB, gasping or wheezing  CV: no obvious  cyanosis  MS: moves all visible extremities without noticeable abnormality  PSYCH/NEURO: pleasant and cooperative, no obvious depression or anxiety, speech and thought processing grossly intact  ASSESSMENT AND PLAN:  Discussed the following assessment and plan:  COVID-19  -we discussed possible serious and likely etiologies, options for evaluation and workup, limitations of telemedicine visit vs in person visit, treatment, treatment risks and precautions. Pt prefers to treat via telemedicine empirically rather than in person at this moment.  Query rebound COVID, prolonged course of COVID (I have been seeing quite a few patients for follow-up the last few weeks you are having prolonged symptoms and positive test greater than 10 days with this new variant,) versus other.  He has opted to try a higher dose of the Tessalon, as it does seem to help some.  He also agreed to isolate for an additional several days,  then recheck a test if he is not feeling better.  Did advise if if worsening, new symptoms arise, or if is not improving with treatment over the next several days that he seek in person evaluation. Discussed options for inperson care if PCP office not available. Did let this patient know that I only do telemedicine on Tuesdays and Thursdays for [place]. Advised to schedule follow up visit with PCP or [facility] if any further questions or concerns to avoid delays in care.   I discussed the assessment and treatment plan with the patient. The patient was provided an opportunity to ask questions and all were answered. The patient agreed with the plan and demonstrated an understanding of the instructions.     Lucretia Kern, DO

## 2021-08-10 DIAGNOSIS — Z6826 Body mass index (BMI) 26.0-26.9, adult: Secondary | ICD-10-CM | POA: Diagnosis not present

## 2021-08-10 DIAGNOSIS — E663 Overweight: Secondary | ICD-10-CM | POA: Diagnosis not present

## 2021-08-10 DIAGNOSIS — M0589 Other rheumatoid arthritis with rheumatoid factor of multiple sites: Secondary | ICD-10-CM | POA: Diagnosis not present

## 2021-08-10 DIAGNOSIS — M150 Primary generalized (osteo)arthritis: Secondary | ICD-10-CM | POA: Diagnosis not present

## 2021-08-10 DIAGNOSIS — M25511 Pain in right shoulder: Secondary | ICD-10-CM | POA: Diagnosis not present

## 2021-08-14 ENCOUNTER — Ambulatory Visit: Payer: HMO | Admitting: Family Medicine

## 2021-08-18 DIAGNOSIS — E119 Type 2 diabetes mellitus without complications: Secondary | ICD-10-CM | POA: Diagnosis not present

## 2021-08-19 ENCOUNTER — Other Ambulatory Visit: Payer: Self-pay

## 2021-08-19 ENCOUNTER — Encounter: Payer: Self-pay | Admitting: Family Medicine

## 2021-08-19 ENCOUNTER — Ambulatory Visit (INDEPENDENT_AMBULATORY_CARE_PROVIDER_SITE_OTHER): Payer: HMO | Admitting: Family Medicine

## 2021-08-19 VITALS — BP 132/70 | HR 97 | Temp 97.5°F | Ht 70.0 in | Wt 176.2 lb

## 2021-08-19 DIAGNOSIS — E1151 Type 2 diabetes mellitus with diabetic peripheral angiopathy without gangrene: Secondary | ICD-10-CM | POA: Diagnosis not present

## 2021-08-19 DIAGNOSIS — I152 Hypertension secondary to endocrine disorders: Secondary | ICD-10-CM | POA: Diagnosis not present

## 2021-08-19 DIAGNOSIS — Z23 Encounter for immunization: Secondary | ICD-10-CM | POA: Diagnosis not present

## 2021-08-19 DIAGNOSIS — N200 Calculus of kidney: Secondary | ICD-10-CM | POA: Diagnosis not present

## 2021-08-19 DIAGNOSIS — E1159 Type 2 diabetes mellitus with other circulatory complications: Secondary | ICD-10-CM | POA: Diagnosis not present

## 2021-08-19 LAB — POCT GLYCOSYLATED HEMOGLOBIN (HGB A1C): Hemoglobin A1C: 6.7 % — AB (ref 4.0–5.6)

## 2021-08-19 NOTE — Assessment & Plan Note (Signed)
A1c at goal at 6.7 on metformin '750mg'$  twice daily. Follow up in 6 months.

## 2021-08-19 NOTE — Assessment & Plan Note (Signed)
No follow-up with urologist.  Recommend potassium citrate.  Could consider adding on HCTZ to prevent kidney stones in the future if blood pressure tolerates.

## 2021-08-19 NOTE — Progress Notes (Signed)
   [name] is a 75 y.o. male who presents today for an office visit.  Assessment/Plan:  New/Acute Problems: Cough No red flags.  Normal exam today.  Likely sequela of COVID-19.  Discussed typical recovery course after any respiratory infection including COVID.  We will continue with watchful waiting for now.  He can use over-the-counter meds as needed.  Chronic Problems Addressed Today: DM (diabetes mellitus), type 2 with peripheral vascular complications ([name]) 123456 at goal at 6.7 on metformin '750mg'$  twice daily. Follow up in 6 months.   Hypertension associated with diabetes ([place]) At goal on lopressor 12.'5mg'$  daily. Recently had labs with rheumatology. He will bring [place] a copy.   Nephrolithiasis No follow-up with urologist.  Recommend potassium citrate.  Could consider adding on HCTZ to prevent kidney stones in the future if blood pressure tolerates.     Subjective:  HPI:  He mentions that he had covid in July, symptoms experienced being mild. He is experiencing a residual cough from it, causing him to cough in the morning and evening which he takes Tessalon cough pearls for if it gets particularly bothersome.  Also, he states that his rheumatoid arthritis is approaching remission.  Additionally, he has had an issue with kidney stone production of calcium oxylate composition. About every two weeks he passes a stone, feels like he is sitting on a rock after, feels better for a week and then the cycle repeats. Because of this he expresses an interest in finding a medication to help alleviate or resolve this problem. Since last seeing his urologist 6 months ago, he has passed ~15 stones. He has been drinking Crystal Light with calcium citrate to try and alleviate them. He has had issues with kidney stones since his early 20's.         Objective:  Physical Exam: BP 132/70   Pulse 97   Temp (!) 97.5 F (36.4 C) (Temporal)   Ht '5\' 10"'$  (1.778 m)   Wt 176 lb 3.2 oz (79.9 kg)    SpO2 96%   BMI 25.28 kg/m   Gen: No acute distress, resting comfortably CV: Regular rate and rhythm with no murmurs appreciated Pulm: Normal work of breathing, clear to auscultation bilaterally with no crackles, wheezes, or rhonchi Neuro: Grossly normal, moves all extremities Psych: Normal affect and thought content      I,Jordan Kelly,acting as a scribe for Dimas Chyle, MD.,have documented all relevant documentation on the behalf of Dimas Chyle, MD,as directed by  Dimas Chyle, MD while in the presence of Dimas Chyle, MD.  I, Dimas Chyle, MD, have reviewed all documentation for this visit. The documentation on 08/19/21 for the exam, diagnosis, procedures, and orders are all accurate and complete.  Algis Greenhouse. Jerline Pain, MD 08/19/2021 10:39 AM

## 2021-08-19 NOTE — Patient Instructions (Signed)
It was very nice to see you today!  Your A1c looks good today.  No medication changes.  Try taking potassium citrate.  Please talk with urologist about other ways to prevent kidney stones.  I will see you back in 6 months.  Please come back to see me sooner if needed.  Take care, Dr Jerline Pain  PLEASE NOTE:  If you had any lab tests please let us know if you have not heard back within a few days. You may see your results on mychart before we have a chance to review them but we will give you a call once they are reviewed by [place]. If we ordered any referrals today, please let us know if you have not heard from their office within the next week.   Please try these tips to maintain a healthy lifestyle:  Eat at least 3 REAL meals and 1-2 snacks per day.  Aim for no more than 5 hours between eating.  If you eat breakfast, please do so within one hour of getting up.   Each meal should contain half fruits/vegetables, one quarter protein, and one quarter carbs (no bigger than a computer mouse)  Cut down on sweet beverages. This includes juice, soda, and sweet tea.   Drink at least 1 glass of water with each meal and aim for at least 8 glasses per day  Exercise at least 150 minutes every week.

## 2021-08-19 NOTE — Assessment & Plan Note (Signed)
At goal on lopressor 12.'5mg'$  daily. Recently had labs with rheumatology. He will bring [place] a copy.

## 2021-08-31 ENCOUNTER — Other Ambulatory Visit: Payer: Self-pay

## 2021-08-31 ENCOUNTER — Other Ambulatory Visit: Payer: Self-pay | Admitting: Family Medicine

## 2021-08-31 ENCOUNTER — Encounter: Payer: Self-pay | Admitting: Family Medicine

## 2021-09-04 ENCOUNTER — Other Ambulatory Visit: Payer: Self-pay | Admitting: *Deleted

## 2021-09-04 ENCOUNTER — Ambulatory Visit: Payer: 59

## 2021-09-04 ENCOUNTER — Telehealth: Payer: Self-pay

## 2021-09-04 MED ORDER — METFORMIN HCL ER 750 MG PO TB24: 750.0000 mg | ORAL_TABLET | Freq: Two times a day (BID) | ORAL | 2 refills | Status: DC

## 2021-09-04 NOTE — Telephone Encounter (Signed)
Rx send to pharmacy  

## 2021-09-07 ENCOUNTER — Encounter: Payer: Self-pay | Admitting: Family Medicine

## 2021-09-07 ENCOUNTER — Ambulatory Visit (INDEPENDENT_AMBULATORY_CARE_PROVIDER_SITE_OTHER)
Admission: RE | Admit: 2021-09-07 | Discharge: 2021-09-07 | Disposition: A | Discharge status: Home / Self Care | Payer: HMO | Source: Ambulatory Visit | Attending: Family Medicine | Admitting: Family Medicine

## 2021-09-07 ENCOUNTER — Other Ambulatory Visit: Payer: Self-pay

## 2021-09-07 ENCOUNTER — Ambulatory Visit (INDEPENDENT_AMBULATORY_CARE_PROVIDER_SITE_OTHER): Payer: HMO | Admitting: Family Medicine

## 2021-09-07 VITALS — BP 134/77 | HR 84 | Temp 98.0°F | Ht 70.0 in | Wt 176.0 lb

## 2021-09-07 DIAGNOSIS — J309 Allergic rhinitis, unspecified: Secondary | ICD-10-CM

## 2021-09-07 DIAGNOSIS — R059 Cough, unspecified: Secondary | ICD-10-CM

## 2021-09-07 DIAGNOSIS — M069 Rheumatoid arthritis, unspecified: Secondary | ICD-10-CM

## 2021-09-07 DIAGNOSIS — R079 Chest pain, unspecified: Secondary | ICD-10-CM | POA: Diagnosis not present

## 2021-09-07 MED ORDER — PREDNISONE 50 MG PO TABS: ORAL_TABLET | ORAL | 0 refills | Status: DC

## 2021-09-07 MED ORDER — AZITHROMYCIN 250 MG PO TABS: ORAL_TABLET | ORAL | 0 refills | Status: DC

## 2021-09-07 NOTE — Progress Notes (Signed)
   [name] is a 75 y.o. male who presents today for an office visit.  Assessment/Plan:  New/Acute Problems: Cough He is at increased risk for atypical infection due to immunosuppresants for his rheumatoid arthritis.  We will check chest x-ray.  Empirically start azithromycin and prednisone.  Does have history of being former smoker.  We will consider referral to pulmonology if symptoms persist despite above.  Chronic Problems Addressed Today: Rheumatoid arthritis ([place]) Follows with rheumatology.  He is on methotrexate.  Allergic rhinitis Could be contributing to cough however symptoms reasonably well controlled with over-the-counter allergy meds Zyrtec and Flonase.     Subjective:  HPI:  He has concerns about his coughing. Started 3 months ago after having covid. He has been coughing up a white mucus with green spots. Originally it was a clear mucus, but has recently developed coloration and opacity. His chest, ribs, and abdomen hurt from coughing so continuously. Additionally he experiences an increase in body temperature of 1-2 degrees. No shortness of breath. No chills.   He has been taking robitussin and expectorant to attempt to manage the problem. He denies acid reflux.        Objective:  Physical Exam: BP 134/77   Pulse 84   Temp 98 F (36.7 C) (Temporal)   Ht 5\' 10"  (1.778 m)   Wt 176 lb (79.8 kg)   SpO2 96%   BMI 25.25 kg/m   Gen: No acute distress, resting comfortably CV: Regular rate and rhythm with no murmurs appreciated Pulm: Crackles noted at bases bilterally. Normal work of breathing, no wheezes, or rhonchi.  Neuro: Grossly normal, moves all extremities Psych: Normal affect and thought content      Jaylea Plourde M. Jerline Pain, MD 09/07/2021 11:31 AM

## 2021-09-07 NOTE — Assessment & Plan Note (Signed)
Follows with rheumatology.  He is on methotrexate.

## 2021-09-07 NOTE — Patient Instructions (Addendum)
It was very nice to see you today!  We will get an xray of your lungs.   Please start the antibiotic and prednisone.  We may need to have you see a lung doctor depending on the results of your x-ray.  Take care, Dr Jerline Pain  PLEASE NOTE:  If you had any lab tests please let us know if you have not heard back within a few days. You may see your results on mychart before we have a chance to review them but we will give you a call once they are reviewed by [place]. If we ordered any referrals today, please let us know if you have not heard from their office within the next week.   Please try these tips to maintain a healthy lifestyle:  Eat at least 3 REAL meals and 1-2 snacks per day.  Aim for no more than 5 hours between eating.  If you eat breakfast, please do so within one hour of getting up.   Each meal should contain half fruits/vegetables, one quarter protein, and one quarter carbs (no bigger than a computer mouse)  Cut down on sweet beverages. This includes juice, soda, and sweet tea.   Drink at least 1 glass of water with each meal and aim for at least 8 glasses per day  Exercise at least 150 minutes every week.

## 2021-09-07 NOTE — Assessment & Plan Note (Signed)
Could be contributing to cough however symptoms reasonably well controlled with over-the-counter allergy meds Zyrtec and Flonase.

## 2021-09-08 ENCOUNTER — Encounter: Payer: Self-pay | Admitting: Family Medicine

## 2021-09-09 NOTE — Telephone Encounter (Signed)
Patient aware of lab results see results note

## 2021-09-09 NOTE — Progress Notes (Signed)
Please inform patient of the following:  His xray shows no acute signs of infection though he does have some scaring that is likely due to his previous pneumonia. Would like for him to let us know if he is not improving with the treatment we sent in.

## 2021-10-22 ENCOUNTER — Telehealth: Payer: Self-pay | Admitting: *Deleted

## 2021-10-22 DIAGNOSIS — N401 Enlarged prostate with lower urinary tract symptoms: Secondary | ICD-10-CM | POA: Diagnosis not present

## 2021-10-22 NOTE — Chronic Care Management (AMB) (Signed)
  Chronic Care Management   Note  10/22/2021 Name: [name] MRN: [id number] DOB: [birth year]  [name] is a 75 y.o. year old male who is a primary care patient of Vivi Barrack, MD. I reached out to Lacretia Nicks by phone today in response to a referral sent by [name]'s PCP.  [name] was given information about Chronic Care Management services today including:  CCM service includes personalized support from designated clinical staff supervised by his physician, including individualized plan of care and coordination with other care providers 24/7 contact phone numbers for assistance for urgent and routine care needs. Service will only be billed when office clinical staff spend 20 minutes or more in a month to coordinate care. Only one practitioner may furnish and bill the service in a calendar month. The patient may stop CCM services at any time (effective at the end of the month) by phone call to the office staff. The patient is responsible for co-pay (up to 20% after annual deductible is met) if co-pay is required by the individual health plan.   Patient agreed to services and verbal consent obtained.   Follow up plan: Telephone appointment with care management team member scheduled for:10/29/21  [facility]  Direct Dial: [phone]

## 2021-10-29 ENCOUNTER — Ambulatory Visit (INDEPENDENT_AMBULATORY_CARE_PROVIDER_SITE_OTHER): Payer: HMO | Admitting: *Deleted

## 2021-10-29 DIAGNOSIS — N401 Enlarged prostate with lower urinary tract symptoms: Secondary | ICD-10-CM | POA: Diagnosis not present

## 2021-10-29 DIAGNOSIS — Z9861 Coronary angioplasty status: Secondary | ICD-10-CM

## 2021-10-29 DIAGNOSIS — R3121 Asymptomatic microscopic hematuria: Secondary | ICD-10-CM | POA: Diagnosis not present

## 2021-10-29 DIAGNOSIS — E1151 Type 2 diabetes mellitus with diabetic peripheral angiopathy without gangrene: Secondary | ICD-10-CM

## 2021-10-29 DIAGNOSIS — R351 Nocturia: Secondary | ICD-10-CM | POA: Diagnosis not present

## 2021-10-29 NOTE — Chronic Care Management (AMB) (Signed)
  Care Management   Follow Up Note   10/29/2021 Name: [name] MRN: [id number] DOB: [birth year]   Referred by: Vivi Barrack, MD Reason for referral : Chronic Care Management (Case Closure)   Successful contact was made with the patient to discuss care management and care coordination services. Patient declines engagement at this time.   RNCM introduced self, role, and CCM services.  Patient states he already participates in like program with [facility] and does not whish to duplicate services.  Declines CCM services at [facility] tame.  Follow Up Plan: No further follow up required: as patient declines CCM services.  Robert Azure RN, MSN RN Care Management Coordinator  [facility] [phone] [name]

## 2021-10-29 NOTE — Patient Instructions (Signed)
Visit Information  Thank you for allowing me to share the care management and care coordination services that are available to you as part of your health plan and services through your primary care provider and medical home. Please reach out to me at [phone] if the care management/care coordination team may be of assistance to you in the future.   Brodric Schauer RN, MSN RN Care Management Coordinator  [facility] [phone] [email or web address]  

## 2021-11-02 LAB — PSA: PSA: 1.05

## 2021-11-10 DIAGNOSIS — M0589 Other rheumatoid arthritis with rheumatoid factor of multiple sites: Secondary | ICD-10-CM | POA: Diagnosis not present

## 2021-11-11 DIAGNOSIS — E1151 Type 2 diabetes mellitus with diabetic peripheral angiopathy without gangrene: Secondary | ICD-10-CM

## 2021-11-11 DIAGNOSIS — Z9861 Coronary angioplasty status: Secondary | ICD-10-CM

## 2021-11-11 DIAGNOSIS — Z87891 Personal history of nicotine dependence: Secondary | ICD-10-CM

## 2021-11-11 DIAGNOSIS — I2510 Atherosclerotic heart disease of native coronary artery without angina pectoris: Secondary | ICD-10-CM

## 2021-11-17 DIAGNOSIS — R3121 Asymptomatic microscopic hematuria: Secondary | ICD-10-CM | POA: Diagnosis not present

## 2021-12-09 ENCOUNTER — Other Ambulatory Visit: Payer: Self-pay | Admitting: Family Medicine

## 2021-12-18 ENCOUNTER — Other Ambulatory Visit: Payer: Self-pay

## 2021-12-18 DIAGNOSIS — I10 Essential (primary) hypertension: Secondary | ICD-10-CM

## 2021-12-18 MED ORDER — METOPROLOL TARTRATE 25 MG PO TABS: 12.5000 mg | ORAL_TABLET | Freq: Two times a day (BID) | ORAL | 0 refills | Status: DC

## 2022-01-11 DIAGNOSIS — N202 Calculus of kidney with calculus of ureter: Secondary | ICD-10-CM | POA: Diagnosis not present

## 2022-01-11 DIAGNOSIS — R3121 Asymptomatic microscopic hematuria: Secondary | ICD-10-CM | POA: Diagnosis not present

## 2022-02-03 DIAGNOSIS — N200 Calculus of kidney: Secondary | ICD-10-CM | POA: Diagnosis not present

## 2022-02-03 DIAGNOSIS — R351 Nocturia: Secondary | ICD-10-CM | POA: Diagnosis not present

## 2022-02-03 DIAGNOSIS — N401 Enlarged prostate with lower urinary tract symptoms: Secondary | ICD-10-CM | POA: Diagnosis not present

## 2022-02-10 DIAGNOSIS — M542 Cervicalgia: Secondary | ICD-10-CM | POA: Diagnosis not present

## 2022-02-10 DIAGNOSIS — M25511 Pain in right shoulder: Secondary | ICD-10-CM | POA: Diagnosis not present

## 2022-02-10 DIAGNOSIS — M0589 Other rheumatoid arthritis with rheumatoid factor of multiple sites: Secondary | ICD-10-CM | POA: Diagnosis not present

## 2022-02-10 DIAGNOSIS — E663 Overweight: Secondary | ICD-10-CM | POA: Diagnosis not present

## 2022-02-10 DIAGNOSIS — M150 Primary generalized (osteo)arthritis: Secondary | ICD-10-CM | POA: Diagnosis not present

## 2022-02-10 DIAGNOSIS — Z6826 Body mass index (BMI) 26.0-26.9, adult: Secondary | ICD-10-CM | POA: Diagnosis not present

## 2022-02-10 LAB — BASIC METABOLIC PANEL
BUN: 27 — AB (ref 4–21)
CO2: 26 — AB (ref 13–22)
Chloride: 101 (ref 99–108)
Creatinine: 1.3 (ref 0.6–1.3)
Glucose: 220
Potassium: 4.1 mEq/L (ref 3.5–5.1)
Sodium: 144 (ref 137–147)

## 2022-02-10 LAB — CBC: RBC: 5.23 — AB (ref 3.87–5.11)

## 2022-02-10 LAB — HEPATIC FUNCTION PANEL
ALT: 16 U/L (ref 10–40)
AST: 19 (ref 14–40)
Alkaline Phosphatase: 122 (ref 25–125)
Bilirubin, Total: 0.5

## 2022-02-11 LAB — CBC AND DIFFERENTIAL
HCT: 48 (ref 41–53)
Hemoglobin: 16.4 (ref 13.5–17.5)
Neutrophils Absolute: 7.7
Platelets: 235 10*3/uL (ref 150–400)
WBC: 9.8

## 2022-02-11 LAB — COMPREHENSIVE METABOLIC PANEL
Albumin: 4.6 (ref 3.5–5.0)
Calcium: 10.4 (ref 8.7–10.7)
Globulin: 2.4
eGFR: 59

## 2022-02-15 ENCOUNTER — Other Ambulatory Visit: Payer: Self-pay

## 2022-02-15 MED ORDER — ATORVASTATIN CALCIUM 80 MG PO TABS: ORAL_TABLET | ORAL | 0 refills | Status: DC

## 2022-02-17 DIAGNOSIS — H40013 Open angle with borderline findings, low risk, bilateral: Secondary | ICD-10-CM | POA: Diagnosis not present

## 2022-02-18 ENCOUNTER — Other Ambulatory Visit (INDEPENDENT_AMBULATORY_CARE_PROVIDER_SITE_OTHER): Payer: HMO

## 2022-02-18 ENCOUNTER — Ambulatory Visit (INDEPENDENT_AMBULATORY_CARE_PROVIDER_SITE_OTHER): Payer: HMO | Admitting: Family Medicine

## 2022-02-18 ENCOUNTER — Encounter: Payer: Self-pay | Admitting: Family Medicine

## 2022-02-18 VITALS — BP 145/72 | HR 76 | Temp 97.6°F | Ht 70.0 in | Wt 178.4 lb

## 2022-02-18 DIAGNOSIS — Z0001 Encounter for general adult medical examination with abnormal findings: Secondary | ICD-10-CM | POA: Diagnosis not present

## 2022-02-18 DIAGNOSIS — E1159 Type 2 diabetes mellitus with other circulatory complications: Secondary | ICD-10-CM

## 2022-02-18 DIAGNOSIS — E1151 Type 2 diabetes mellitus with diabetic peripheral angiopathy without gangrene: Secondary | ICD-10-CM

## 2022-02-18 DIAGNOSIS — E1169 Type 2 diabetes mellitus with other specified complication: Secondary | ICD-10-CM

## 2022-02-18 DIAGNOSIS — M069 Rheumatoid arthritis, unspecified: Secondary | ICD-10-CM | POA: Diagnosis not present

## 2022-02-18 DIAGNOSIS — N200 Calculus of kidney: Secondary | ICD-10-CM | POA: Diagnosis not present

## 2022-02-18 DIAGNOSIS — E785 Hyperlipidemia, unspecified: Secondary | ICD-10-CM

## 2022-02-18 DIAGNOSIS — I152 Hypertension secondary to endocrine disorders: Secondary | ICD-10-CM | POA: Diagnosis not present

## 2022-02-18 DIAGNOSIS — M542 Cervicalgia: Secondary | ICD-10-CM | POA: Diagnosis not present

## 2022-02-18 LAB — POCT GLYCOSYLATED HEMOGLOBIN (HGB A1C): Hemoglobin A1C: 7.2 % — AB (ref 4.0–5.6)

## 2022-02-18 NOTE — Assessment & Plan Note (Signed)
Follows with urology.  Recently was started on HCTZ 25 mg daily.  Has been on this in the past and has done well.  Seems to be working well.  No notable side effects.  Recent BMI within normal limits. ?

## 2022-02-18 NOTE — Patient Instructions (Signed)
It was very nice to see you today! ? ?I think you have a trapezius strain. ? ?You can use tylenol and a heating pad. ? ?Let me know if not improving in the next few weeks.  ? ?Your blood work looks like great. ? ?We can recheck in 6 months. ? ?Come back sooner if needed.  ? ?Take care, ?Dr Jerline Pain ? ?PLEASE NOTE: ? ?If you had any lab tests please let us know if you have not heard back within a few days. You may see your results on mychart before we have a chance to review them but we will give you a call once they are reviewed by [place]. If we ordered any referrals today, please let us know if you have not heard from their office within the next week.  ? ?Please try these tips to maintain a healthy lifestyle: ? ?Eat at least 3 REAL meals and 1-2 snacks per day.  Aim for no more than 5 hours between eating.  If you eat breakfast, please do so within one hour of getting up.  ? ?Each meal should contain half fruits/vegetables, one quarter protein, and one quarter carbs (no bigger than a computer mouse) ? ?Cut down on sweet beverages. This includes juice, soda, and sweet tea.  ? ?Drink at least 1 glass of water with each meal and aim for at least 8 glasses per day ? ?Exercise at least 150 minutes every week.   ? ?Preventive Care 53 Years and Older, Male ?Preventive care refers to lifestyle choices and visits with your health care provider that can promote health and wellness. Preventive care visits are also called wellness exams. ?What can I expect for my preventive care visit? ?Counseling ?During your preventive care visit, your health care provider may ask about your: ?Medical history, including: ?Past medical problems. ?Family medical history. ?History of falls. ?Current health, including: ?Emotional well-being. ?Home life and relationship well-being. ?Sexual activity. ?Memory and ability to understand (cognition). ?Lifestyle, including: ?Alcohol, nicotine or tobacco, and drug use. ?Access to firearms. ?Diet, exercise,  and sleep habits. ?Work and work Statistician. ?Sunscreen use. ?Safety issues such as seatbelt and bike helmet use. ?Physical exam ?Your health care provider will check your: ?Height and weight. These may be used to calculate your BMI (body mass index). BMI is a measurement that tells if you are at a healthy weight. ?Waist circumference. This measures the distance around your waistline. This measurement also tells if you are at a healthy weight and may help predict your risk of certain diseases, such as type 2 diabetes and high blood pressure. ?Heart rate and blood pressure. ?Body temperature. ?Skin for abnormal spots. ?What immunizations do I need? ?Vaccines are usually given at various ages, according to a schedule. Your health care provider will recommend vaccines for you based on your age, medical history, and lifestyle or other factors, such as travel or where you work. ?What tests do I need? ?Screening ?Your health care provider may recommend screening tests for certain conditions. This may include: ?Lipid and cholesterol levels. ?Diabetes screening. This is done by checking your blood sugar (glucose) after you have not eaten for a while (fasting). ?Hepatitis C test. ?Hepatitis B test. ?HIV (human immunodeficiency virus) test. ?STI (sexually transmitted infection) testing, if you are at risk. ?Lung cancer screening. ?Colorectal cancer screening. ?Prostate cancer screening. ?Abdominal aortic aneurysm (AAA) screening. You may need this if you are a current or former smoker. ?Talk with your health care provider about your test results,  treatment options, and if necessary, the need for more tests. ?Follow these instructions at home: ?Eating and drinking ? ?Eat a diet that includes fresh fruits and vegetables, whole grains, lean protein, and low-fat dairy products. Limit your intake of foods with high amounts of sugar, saturated fats, and salt. ?Take vitamin and mineral supplements as recommended by your health care  provider. ?Do not drink alcohol if your health care provider tells you not to drink. ?If you drink alcohol: ?Limit how much you have to 0-2 drinks a day. ?Know how much alcohol is in your drink. In the [place] one drink equals one 12 oz bottle of beer (355 mL), one 5 oz glass of wine (148 mL), or one 1? oz glass of hard liquor (44 mL). ?Lifestyle ?Brush your teeth every morning and night with fluoride toothpaste. Floss one time each day. ?Exercise for at least 30 minutes 5 or more days each week. ?Do not use any products that contain nicotine or tobacco. These products include cigarettes, chewing tobacco, and vaping devices, such as e-cigarettes. If you need help quitting, ask your health care provider. ?Do not use drugs. ?If you are sexually active, practice safe sex. Use a condom or other form of protection to prevent STIs. ?Take aspirin only as told by your health care provider. Make sure that you understand how much to take and what form to take. Work with your health care provider to find out whether it is safe and beneficial for you to take aspirin daily. ?Ask your health care provider if you need to take a cholesterol-lowering medicine (statin). ?Find healthy ways to manage stress, such as: ?Meditation, yoga, or listening to music. ?Journaling. ?Talking to a trusted person. ?Spending time with friends and family. ?Safety ?Always wear your seat belt while driving or riding in a vehicle. ?Do not drive: ?If you have been drinking alcohol. Do not ride with someone who has been drinking. ?When you are tired or distracted. ?While texting. ?If you have been using any mind-altering substances or drugs. ?Wear a helmet and other protective equipment during sports activities. ?If you have firearms in your house, make sure you follow all gun safety procedures. ?Minimize exposure to UV radiation to reduce your risk of skin cancer. ?What's next? ?Visit your health care provider once a year for an annual wellness visit. ?Ask  your health care provider how often you should have your eyes and teeth checked. ?Stay up to date on all vaccines. ?This information is not intended to replace advice given to you by your health care provider. Make sure you discuss any questions you have with your health care provider. ?Document Revised: 05/27/2021 Document Reviewed: 05/27/2021 ?Elsevier Patient Education ? Dent. ? ?

## 2022-02-18 NOTE — Assessment & Plan Note (Signed)
Last LDL was at goal has been a few years since last checked.  He will be following up with cardiology soon.  He declined blood work today.  Continue Lipitor 80 mg daily. ?

## 2022-02-18 NOTE — Assessment & Plan Note (Signed)
Continue management per rheumatology. 

## 2022-02-18 NOTE — Assessment & Plan Note (Signed)
Slightly above goal though typically will controlled.  Continue Lopressor 12.5 mg daily and HCTZ 25 mg daily. ?

## 2022-02-18 NOTE — Progress Notes (Signed)
Chief Complaint:  [name] is a 76 y.o. male who presents today for his annual comprehensive physical exam.    Assessment/Plan:  New/Acute Problems: Neck Pain No red flags.  Consistent with trapezius strain.  He is already on meloxicam.  Discussed home exercises.  He will continue with heating pad.  Can also use Tylenol.  He will let me know if not improving and we can refer to sports medicine or PT.  Chronic Problems Addressed Today: DM (diabetes mellitus), type 2 with peripheral vascular complications ([name]) L2G slightly elevated to 7.2.  We will continue current regimen metformin 750 mg twice daily.  Discussed lifestyle modifications.  Recheck in 6 months.  Hyperlipidemia due to type 2 diabetes mellitus ([place]) Last LDL was at goal has been a few years since last checked.  He will be following up with cardiology soon.  He declined blood work today.  Continue Lipitor 80 mg daily.  Nephrolithiasis Follows with urology.  Recently was started on HCTZ 25 mg daily.  Has been on this in the past and has done well.  Seems to be working well.  No notable side effects.  Recent BMI within normal limits.  Rheumatoid arthritis ([place]) Continue management per rheumatology.  Hypertension associated with diabetes ([place]) Slightly above goal though typically will controlled.  Continue Lopressor 12.5 mg daily and HCTZ 25 mg daily.   Preventative Healthcare: UTD on screenings and vaccines.   Patient Counseling(The following topics were reviewed and/or handout was given):  -Nutrition: Stressed importance of moderation in sodium/caffeine intake, saturated fat and cholesterol, caloric balance, sufficient intake of fresh fruits, vegetables, and fiber.  -Stressed the importance of regular exercise.   -Substance Abuse: Discussed cessation/primary prevention of tobacco, alcohol, or other drug use; driving or other dangerous activities under the influence; availability of treatment for abuse.    -Injury prevention: Discussed safety belts, safety helmets, smoke detector, smoking near bedding or upholstery.   -Sexuality: Discussed sexually transmitted diseases, partner selection, use of condoms, avoidance of unintended pregnancy and contraceptive alternatives.   -Dental health: Discussed importance of regular tooth brushing, flossing, and dental visits.  -Health maintenance and immunizations reviewed. Please refer to Health maintenance section.  Return to care in 1 year for next preventative visit.     Subjective:  HPI:  His A1c was 7.2 in the office today. He notes he has not been exercising. He thinks this could be contributing to elevated blood sugar. He is currently taking Metformin 750 mg twice daily. He is tolerating his medication. No side effects. He is unable to exercise due to kidney stones. He has to go to restroom frequently. He is following healthy diet and trying to exercise. Denies nausea or vomiting.   He complain of neck pain for the past few months. He thinks he might have strain his neck. He notes he was trying to pulled his kayak when he hurt his neck. This has been better than before. Symptoms get worse when moving the head. He has tried some heating pad with some improvement. He also feel some pins and needles around the neck after waking up from sleep. He does have a hx of rheumatoid arthritis.   He has had issue with kidney stones. He notes he had passed  about 10-15 stones since last year. He saw urologist since last visit. He was started on Flomax to help with the symptoms. He is taking Crystal light with potassium citrate about one bottle a day. This has helped. He passes  kidney every two weeks since being off on HCTZ. He notes he use to passes few kidney every 2 years but since stopped taking HCTZ this has worsen. No reported nausea or vomiting.    Depression screen [facility] 2/9 02/18/2022  Decreased Interest 0  Down, Depressed, Hopeless 0  PHQ - 2 Score 0   ROS: Per  HPI, otherwise a complete review of systems was negative.   PMH:  The following were reviewed and entered/updated in epic: Past Medical History:  Diagnosis Date   Allergic rhinitis    Arthritis    CAD (coronary artery disease)    a. 11/11/15 - OOH VF arrest 2/2 ant STEMI >> CPR//AED shock x 2 >> ROSC >> LHC: pLAD 100 (Promus DES), mLCx 50, mRCA 30, EF 25% [managed with hypothermia protocol]   Fractured rib    GERD (gastroesophageal reflux disease)    History of Clostridium difficile colitis 10/2015   post MI >> required IV Flagyl and IV Vanc >> DC on po Vanc   History of kidney stones    History of lithotripsy    HLD (hyperlipidemia)    Hypertension    Impaired glucose tolerance    Insomnia    Ischemic cardiomyopathy    a. EF at [facility] 11/11/15 25%;  b. Echo 11/14/15: EF 50-55%, dist ant and anterior septal HK   Nephrolithiasis 1968   Pelvic fracture ([place]) 09/2015   Pre-diabetes    Rheumatoid arthritis(714.0) 1998   ST elevation (STEMI) myocardial infarction involving left anterior descending coronary artery ([place]) 11/11/15   100% LAD, Cardiogenic Shock. -- EF ~20-25%   Patient Active Problem List   Diagnosis Date Noted   Sciatica 08/13/2020   Fatigue 01/01/2019   Abdominal bloating 06/29/2018   DM (diabetes mellitus), type 2 with peripheral vascular complications ([place]) 27/25/3664   Hyperlipidemia due to type 2 diabetes mellitus ([place]) 12/22/2015   CAD S/P PCI OF pLAD with 3.5 mm x38 mm Promus DES (3.75 mm) 11/12/2015   Cardiomyopathy, ischemic - EF 20-25% by LV Gram 11/12/2015   Hypertension associated with diabetes ([place]) 03/24/2010   TOBACCO USE, QUIT 09/23/2009   Allergic rhinitis 12/18/2008   Rheumatoid arthritis ([place]) 12/18/2008   INSOMNIA 12/18/2008   Nephrolithiasis 12/18/2008   Past Surgical History:  Procedure Laterality Date   CARDIAC CATHETERIZATION N/A 11/11/2015   Procedure: Left Heart Cath and Cors/Grafts Angiography;  Surgeon: Sherren Mocha, MD;  Location:  [place] [facility];  Service: Cardiovascular;  Laterality: N/A;   CARDIAC CATHETERIZATION N/A 11/11/2015   Procedure: Coronary Stent Intervention;  Surgeon: Sherren Mocha, MD;  Location: [place] [facility];  Service: Cardiovascular;  Laterality: N/A;  prox lad 3.5x38 promus   CORONARY ANGIOPLASTY WITH STENT PLACEMENT  2016   EXTRACORPOREAL SHOCK WAVE LITHOTRIPSY Left 08/29/2017   Procedure: LEFT EXTRACORPOREAL SHOCK WAVE LITHOTRIPSY (ESWL);  Surgeon: Raynelle Bring, MD;  Location: [facility];  Service: Urology;  Laterality: Left;   status post multiple lithotripy and urological surgery for reccurent calcium oxalate stones      Family History  Problem Relation Age of Onset   Cancer Mother        pancreatic ; pulmonary embolism    Diabetes Father     Medications- reviewed and updated Current Outpatient Medications  Medication Sig Dispense Refill   aspirin EC 81 MG tablet Take 81 mg by mouth daily.     atorvastatin (LIPITOR) 80 MG tablet TAKE 1 TABLET BY MOUTH  DAILY AT 6PM. Please keep upcoming appt in April  2023 with Dr. Burt Knack before anymore refills. Thank you Final Attempt 90 tablet 0   cetirizine (ZYRTEC) 10 MG tablet Take 10 mg by mouth daily.     Cholecalciferol (VITAMIN D-3) 1000 UNITS CAPS Take 5,000 Units by mouth daily.      famotidine (PEPCID) 20 MG tablet Take 1 tablet (20 mg total) by mouth 2 (two) times daily. 180 tablet 0   finasteride (PROSCAR) 5 MG tablet Take 5 mg by mouth daily.     fluticasone (FLONASE) 50 MCG/ACT nasal spray USE 2 SPRAYS IN EACH  NOSTRIL DAILY 48 g 3   folic acid (FOLVITE) 1 MG tablet Take 1 mg by mouth daily.     hydrochlorothiazide (HYDRODIURIL) 25 MG tablet 1 tablet in the morning     meloxicam (MOBIC) 7.5 MG tablet Take 7.5 mg by mouth 2 (two) times daily.     methotrexate (RHEUMATREX) 2.5 MG tablet Take 15 mg by mouth once a week. Caution:Chemotherapy. Protect from light.     metoprolol tartrate (LOPRESSOR) 25 MG tablet Take 0.5 tablets (12.5 mg  total) by mouth 2 (two) times daily. Please make overdue appt with Dr. Burt Knack before anymore refills. Thank you 1st attempt 30 tablet 0   nitroGLYCERIN (NITROSTAT) 0.4 MG SL tablet Place 1 tablet under the tongue as needed. Every 5 minutes for chest pain  3   Probiotic Product (PRO-BIOTIC BLEND PO) Take 1 tablet by mouth daily.     SODIUM FLUORIDE 5000 PPM 1.1 % PSTE Take by mouth as directed.     traMADol (ULTRAM) 50 MG tablet Take 50 mg by mouth every 6 (six) hours as needed for moderate pain.     traZODone (DESYREL) 50 MG tablet Take 1 tablet by mouth every night at bedtime 90 tablet 1   HYDROcodone-acetaminophen (NORCO/VICODIN) 5-325 MG tablet 1 tablet as needed     metFORMIN (GLUCOPHAGE-XR) 750 MG 24 hr tablet Take 1 tablet (750 mg total) by mouth 2 (two) times daily. 180 tablet 2   No current facility-administered medications for this visit.    Allergies-reviewed and updated Allergies  Allergen Reactions   Penicillins Anaphylaxis   Contrast Media [Iodinated Contrast Media] Hives    03/28/14 pt received 1 hour emergent prep and pt did good and had no complaints after scan when given IV benadryl first. Had reaction 1 time, tolerated fine in 03/2014 with Benadryl   Sulfamethoxazole Hives    Childhood reaction   Ibuprofen Swelling    Happened 1 time    Social History   Socioeconomic History   Marital status: Married    Spouse name: Not on file   Number of children: Not on file   Years of education: Not on file   Highest education level: Not on file  Occupational History   Occupation: retired  Tobacco Use   Smoking status: Former   Smokeless tobacco: Never   Tobacco comments:    quit 30 yrs ago  Scientific laboratory technician Use: Never used  Substance and Sexual Activity   Alcohol use: No   Drug use: No   Sexual activity: Not on file  Other Topics Concern   Not on file  Social History Narrative   Not on file   Social Determinants of Health   Financial Resource Strain: Not on  file  Food Insecurity: Not on file  Transportation Needs: Not on file  Physical Activity: Not on file  Stress: Not on file  Social Connections: Not on file  Objective:  Physical Exam: BP (!) 145/72 (BP Location: Left Arm)    Pulse 76    Temp 97.6 F (36.4 C) (Temporal)    Ht '5\' 10"'$  (1.778 m)    Wt 178 lb 6.4 oz (80.9 kg)    SpO2 98%    BMI 25.60 kg/m   Body mass index is 25.6 kg/m. Wt Readings from Last 3 Encounters:  02/18/22 178 lb 6.4 oz (80.9 kg)  09/07/21 176 lb (79.8 kg)  08/19/21 176 lb 3.2 oz (79.9 kg)   Gen: NAD, resting comfortably HEENT: TMs normal bilaterally. OP clear. No thyromegaly noted.  CV: RRR with no murmurs appreciated Pulm: NWOB, CTAB with no crackles, wheezes, or rhonchi GI: Normal bowel sounds present. Soft, Nontender, Nondistended. MSK: no edema, cyanosis, or clubbing noted - Neck: No deformities.  Tenderness to palpation along left trapezius.  Neurovascularly intact distally. Skin: warm, dry Neuro: CN2-12 grossly intact. Strength 5/5 in upper and lower extremities. Reflexes symmetric and intact bilaterally.  Psych: Normal affect and thought content     Jager Koska M. Jerline Pain, MD 02/18/2022 11:06 AM

## 2022-02-18 NOTE — Assessment & Plan Note (Signed)
A1c slightly elevated to 7.2.  We will continue current regimen metformin 750 mg twice daily.  Discussed lifestyle modifications.  Recheck in 6 months. ?

## 2022-02-24 ENCOUNTER — Other Ambulatory Visit: Payer: Self-pay | Admitting: Cardiovascular Disease

## 2022-02-24 DIAGNOSIS — I10 Essential (primary) hypertension: Secondary | ICD-10-CM

## 2022-03-15 ENCOUNTER — Ambulatory Visit: Payer: HMO | Admitting: Cardiovascular Disease

## 2022-03-15 ENCOUNTER — Encounter: Payer: Self-pay | Admitting: Cardiovascular Disease

## 2022-03-15 VITALS — BP 122/80 | HR 76 | Ht 70.0 in | Wt 180.6 lb

## 2022-03-15 DIAGNOSIS — Z9861 Coronary angioplasty status: Secondary | ICD-10-CM | POA: Diagnosis not present

## 2022-03-15 DIAGNOSIS — E785 Hyperlipidemia, unspecified: Secondary | ICD-10-CM | POA: Diagnosis not present

## 2022-03-15 DIAGNOSIS — I2510 Atherosclerotic heart disease of native coronary artery without angina pectoris: Secondary | ICD-10-CM | POA: Diagnosis not present

## 2022-03-15 DIAGNOSIS — I10 Essential (primary) hypertension: Secondary | ICD-10-CM | POA: Diagnosis not present

## 2022-03-15 NOTE — Progress Notes (Signed)
?Cardiology Office Note:   ? ?Date:  03/15/2022  ? ?ID:  [name], DOB 10/31/1946, MRN [id number] ? ?PCP:  Vivi Barrack, MD ?  ?[facility] Providers ?Cardiologist:  Sherren Mocha, MD    ? ?Referring MD: Vivi Barrack, MD  ? ?Chief Complaint  ?Patient presents with  ? Coronary Artery Disease  ? ? ?History of Present Illness:   ? ?[name] is a 76 y.o. male with a hx of CAD, DM2, HLD and HTN seen for follow up.  ?  ?Hx of CAD s/p anterior STEMI in 2016 (out of hospital arrest) due to total occlusion of proximal LAD treated with DES, initially severe LV dysfunction, improved with revascularization. ? ?The patient is here alone today.  He is doing well from a cardiac perspective.  He denies chest pain, chest pressure, shortness of breath, heart palpitations, leg swelling, or claudication symptoms.  He is not as active over the winter months, but continues to feel well.  He rides a bike on the trails for exercise.  He also likes to kayak.  He has had no symptoms with this level of exertion. ? ?Past Medical History:  ?Diagnosis Date  ? Allergic rhinitis   ? Arthritis   ? CAD (coronary artery disease)   ? a. 11/11/15 - OOH VF arrest 2/2 ant STEMI >> CPR//AED shock x 2 >> ROSC >> LHC: pLAD 100 (Promus DES), mLCx 50, mRCA 30, EF 25% [managed with hypothermia protocol]  ? Fractured rib   ? GERD (gastroesophageal reflux disease)   ? History of Clostridium difficile colitis 10/2015  ? post MI >> required IV Flagyl and IV Vanc >> DC on po Vanc  ? History of kidney stones   ? History of lithotripsy   ? HLD (hyperlipidemia)   ? Hypertension   ? Impaired glucose tolerance   ? Insomnia   ? Ischemic cardiomyopathy   ? a. EF at [facility] 11/11/15 25%;  b. Echo 11/14/15: EF 50-55%, dist ant and anterior septal HK  ? Nephrolithiasis 1968  ? Pelvic fracture ([place]) 09/2015  ? Pre-diabetes   ? Rheumatoid arthritis(714.0) 1998  ? ST elevation (STEMI) myocardial infarction involving left anterior descending coronary  artery ([place]) 11/11/15  ? 100% LAD, Cardiogenic Shock. -- EF ~20-25%  ? ? ?Past Surgical History:  ?Procedure Laterality Date  ? CARDIAC CATHETERIZATION N/A 11/11/2015  ? Procedure: Left Heart Cath and Cors/Grafts Angiography;  Surgeon: Sherren Mocha, MD;  Location: [place] [facility];  Service: Cardiovascular;  Laterality: N/A;  ? CARDIAC CATHETERIZATION N/A 11/11/2015  ? Procedure: Coronary Stent Intervention;  Surgeon: Sherren Mocha, MD;  Location: [place] [facility];  Service: Cardiovascular;  Laterality: N/A;  prox lad 3.5x38 promus  ? CORONARY ANGIOPLASTY WITH STENT PLACEMENT  2016  ? EXTRACORPOREAL SHOCK WAVE LITHOTRIPSY Left 08/29/2017  ? Procedure: LEFT EXTRACORPOREAL SHOCK WAVE LITHOTRIPSY (ESWL);  Surgeon: Raynelle Bring, MD;  Location: [facility];  Service: Urology;  Laterality: Left;  ? status post multiple lithotripy and urological surgery for reccurent calcium oxalate stones    ? ? ?Current Medications: ?Current Meds  ?Medication Sig  ? aspirin EC 81 MG tablet Take 81 mg by mouth daily.  ? atorvastatin (LIPITOR) 80 MG tablet TAKE 1 TABLET BY MOUTH  DAILY AT 6PM. Please keep upcoming appt in April 2023 with Dr. Burt Knack before anymore refills. Thank you Final Attempt  ? cetirizine (ZYRTEC) 10 MG tablet Take 10 mg by mouth daily.  ? Cholecalciferol (VITAMIN D-3) 1000  UNITS CAPS Take 5,000 Units by mouth daily.   ? famotidine (PEPCID) 20 MG tablet Take 1 tablet (20 mg total) by mouth 2 (two) times daily.  ? finasteride (PROSCAR) 5 MG tablet Take 5 mg by mouth daily.  ? fluticasone (FLONASE) 50 MCG/ACT nasal spray USE 2 SPRAYS IN EACH  NOSTRIL DAILY  ? folic acid (FOLVITE) 1 MG tablet Take 1 mg by mouth daily.  ? hydrochlorothiazide (HYDRODIURIL) 25 MG tablet 1 tablet in the morning  ? HYDROcodone-acetaminophen (NORCO/VICODIN) 5-325 MG tablet 1 tablet as needed  ? meloxicam (MOBIC) 7.5 MG tablet Take 7.5 mg by mouth 2 (two) times daily.  ? methotrexate (RHEUMATREX) 2.5 MG tablet Take 15 mg by mouth once a week.  Caution:Chemotherapy. Protect from light.  ? metoprolol tartrate (LOPRESSOR) 25 MG tablet Take 0.5 tablets (12.5 mg total) by mouth 2 (two) times daily.  ? nitroGLYCERIN (NITROSTAT) 0.4 MG SL tablet Place 1 tablet under the tongue as needed. Every 5 minutes for chest pain  ? Probiotic Product (PRO-BIOTIC BLEND PO) Take 1 tablet by mouth daily.  ? SODIUM FLUORIDE 5000 PPM 1.1 % PSTE Take by mouth as directed.  ? traMADol (ULTRAM) 50 MG tablet Take 50 mg by mouth every 6 (six) hours as needed for moderate pain.  ? traZODone (DESYREL) 50 MG tablet Take 1 tablet by mouth every night at bedtime  ?  ? ?Allergies:   Penicillins, Contrast media [iodinated contrast media], Sulfamethoxazole, and Ibuprofen  ? ?Social History  ? ?Socioeconomic History  ? Marital status: Married  ?  Spouse name: Not on file  ? Number of children: Not on file  ? Years of education: Not on file  ? Highest education level: Not on file  ?Occupational History  ? Occupation: retired  ?Tobacco Use  ? Smoking status: Former  ? Smokeless tobacco: Never  ? Tobacco comments:  ?  quit 30 yrs ago  ?Vaping Use  ? Vaping Use: Never used  ?Substance and Sexual Activity  ? Alcohol use: No  ? Drug use: No  ? Sexual activity: Not on file  ?Other Topics Concern  ? Not on file  ?Social History Narrative  ? Not on file  ? ?Social Determinants of Health  ? ?Financial Resource Strain: Not on file  ?Food Insecurity: Not on file  ?Transportation Needs: Not on file  ?Physical Activity: Not on file  ?Stress: Not on file  ?Social Connections: Not on file  ?  ? ?Family History: ?The patient's family history includes Cancer in his mother; Diabetes in his father. ? ?ROS:   ?Please see the history of present illness.    ?All other systems reviewed and are negative. ? ?EKGs/Labs/Other Studies Reviewed:   ? ?The following studies were reviewed today: ?Echo 11/14/2015: ?Study Conclusions  ? ?- Left ventricle: The cavity size was normal. Systolic function was  ?  normal. The  estimated ejection fraction was in the range of 50%  ?  to 55%. There may be hypokinesis of the distal anterior wall and  ?  anterior septum. The study is not technically sufficient to allow  ?  evaluation of LV diastolic function. ? ?EKG:  EKG is ordered today.  The ekg ordered today demonstrates normal sinus rhythm 76 bpm, nonspecific ST abnormality ? ?Recent Labs: ?02/10/2022: ALT 16; BUN 27; Creatinine 1.3; Hemoglobin 16.4; Platelets 235; Potassium 4.1; Sodium 144  ?Recent Lipid Panel ?   ?Component Value Date/Time  ? CHOL 114 07/03/2019 0950  ? TRIG 190 (  H) 07/03/2019 0950  ? HDL 37 (L) 07/03/2019 0950  ? CHOLHDL 3.1 07/03/2019 0950  ? CHOLHDL 3 06/29/2018 1153  ? VLDL 42.8 (H) 06/29/2018 1153  ? LDLCALC 39 07/03/2019 0950  ? LDLDIRECT 62.0 06/29/2018 1153  ? ? ? ?Risk Assessment/Calculations:   ?  ? ?    ? ?Physical Exam:   ? ?VS:  BP 122/80   Pulse 76   Ht '5\' 10"'$  (1.778 m)   Wt 180 lb 9.6 oz (81.9 kg)   SpO2 95%   BMI 25.91 kg/m?    ? ?Wt Readings from Last 3 Encounters:  ?03/15/22 180 lb 9.6 oz (81.9 kg)  ?02/18/22 178 lb 6.4 oz (80.9 kg)  ?09/07/21 176 lb (79.8 kg)  ?  ? ?GEN:  Well nourished, well developed in no acute distress ?HEENT: Normal ?NECK: No JVD; No carotid bruits ?LYMPHATICS: No lymphadenopathy ?CARDIAC: RRR, no murmurs, rubs, gallops ?RESPIRATORY:  Clear to auscultation without rales, wheezing or rhonchi  ?ABDOMEN: Soft, non-tender, non-distended ?MUSCULOSKELETAL:  No edema; No deformity  ?SKIN: Warm and dry ?NEUROLOGIC:  Alert and oriented x 3 ?PSYCHIATRIC:  Normal affect  ? ?ASSESSMENT:   ? ?1. Hyperlipidemia LDL goal <70   ?2. CAD S/P percutaneous coronary angioplasty   ?3. Essential hypertension   ? ?PLAN:   ? ?In order of problems listed above: ? ?We will update the patient's lipid panel.  Last lipids in 2020 showed an LDL cholesterol of 39.  He has had recent LFTs but needs lipids updated.  Treated with atorvastatin 80 mg daily. ?Doing well with no angina.  Continue aspirin and  atorvastatin.  Follow-up 1 year.  Also treated with low-dose beta-blocker. ?Blood pressure is well controlled on a combination of hydrochlorothiazide and metoprolol.  Hydrochlorothiazide was recently added because of kidn

## 2022-03-15 NOTE — Patient Instructions (Signed)
Medication Instructions:  ?Your physician recommends that you continue on your current medications as directed. Please refer to the Current Medication list given to you today. ? ?*If you need a refill on your cardiac medications before your next appointment, please call your pharmacy* ? ? ?Lab Work: ?LIPIDS ?If you have labs (blood work) drawn today and your tests are completely normal, you will receive your results only by: ?MyChart Message (if you have MyChart) OR ?A paper copy in the mail ?If you have any lab test that is abnormal or we need to change your treatment, we will call you to review the results. ? ? ?Testing/Procedures: ?NONE ? ? ?Follow-Up: ?At [facility], you and your health needs are our priority.  As part of our continuing mission to provide you with exceptional heart care, we have created designated Provider Care Teams.  These Care Teams include your primary Cardiologist (physician) and Advanced Practice Providers (APPs -  Physician Assistants and Nurse Practitioners) who all work together to provide you with the care you need, when you need it. ? ?Your next appointment:   ?1 year(s) ? ?The format for your next appointment:   ?In Person ? ?Provider:   ?Sherren Mocha, MD   ? ?  ?

## 2022-03-17 ENCOUNTER — Other Ambulatory Visit: Payer: HMO | Admitting: *Deleted

## 2022-03-17 DIAGNOSIS — E785 Hyperlipidemia, unspecified: Secondary | ICD-10-CM | POA: Diagnosis not present

## 2022-03-17 LAB — LIPID PANEL
Chol/HDL Ratio: 3.9 ratio (ref 0.0–5.0)
Cholesterol, Total: 125 mg/dL (ref 100–199)
HDL: 32 mg/dL — ABNORMAL LOW (ref >39)
LDL Chol Calc (NIH): 62 mg/dL (ref 0–99)
Triglycerides: 187 mg/dL — ABNORMAL HIGH (ref 0–149)
VLDL Cholesterol Cal: 31 mg/dL (ref 5–40)

## 2022-03-18 DIAGNOSIS — M542 Cervicalgia: Secondary | ICD-10-CM | POA: Diagnosis not present

## 2022-03-22 ENCOUNTER — Other Ambulatory Visit: Payer: Self-pay

## 2022-03-22 DIAGNOSIS — I10 Essential (primary) hypertension: Secondary | ICD-10-CM

## 2022-03-22 MED ORDER — METOPROLOL TARTRATE 25 MG PO TABS: 12.5000 mg | ORAL_TABLET | Freq: Two times a day (BID) | ORAL | 3 refills | Status: DC

## 2022-03-26 DIAGNOSIS — M542 Cervicalgia: Secondary | ICD-10-CM | POA: Diagnosis not present

## 2022-04-07 DIAGNOSIS — M542 Cervicalgia: Secondary | ICD-10-CM | POA: Diagnosis not present

## 2022-04-08 ENCOUNTER — Ambulatory Visit (INDEPENDENT_AMBULATORY_CARE_PROVIDER_SITE_OTHER): Payer: HMO

## 2022-04-08 DIAGNOSIS — Z0000 Encounter for general adult medical examination without abnormal findings: Secondary | ICD-10-CM

## 2022-04-08 NOTE — Progress Notes (Signed)
Virtual Visit via Telephone Note ? ?I connected with  [name] on 04/08/22 at  9:45 AM EDT by telephone and verified that I am speaking with the correct person using two identifiers. ? ?Medicare Annual Wellness visit completed telephonically due to Covid-19 pandemic.  ? ?Persons participating in this call: This Health Coach and this patient.  ? ?Location: ?Patient: Home ?Provider: Office  ?  ?I discussed the limitations, risks, security and privacy concerns of performing an evaluation and management service by telephone and the availability of in person appointments. The patient expressed understanding and agreed to proceed. ? ?Unable to perform video visit due to video visit attempted and failed and/or patient does not have video capability.  ? ?Some vital signs may be absent or patient reported.  ? ?Willette Brace, LPN ? ? ?Subjective:  ? [name] is a 76 y.o. male who presents for Medicare Annual/Subsequent preventive examination. ? ?Review of Systems    ? ?Cardiac Risk Factors include: advanced age (>66mn, >>69women);diabetes mellitus;male gender;hypertension ? ?   ?Objective:  ?  ?There were no vitals filed for this visit. ?There is no height or weight on file to calculate BMI. ? ? ?  04/08/2022  ?  9:46 AM 12/01/2020  ?  1:54 PM 08/29/2020  ? 10:21 AM 08/29/2017  ?  7:19 AM 08/24/2017  ? 10:07 AM 11/12/2015  ?  2:00 AM 03/29/2015  ?  3:47 PM  ?Advanced Directives  ?Does Patient Have a Medical Advance Directive? Yes Yes Yes Yes Yes No Yes  ?Type of [place]amed[place]of [place] will [place] will [place]Living will [place]Living will  [place] will  ?Does patient want to make changes to medical advance directive?  No - Patient declined  No - Patient declined   No - Patient declined  ?Copy of [place] Chart? No - copy  requested  No - copy requested Yes Yes  No - copy requested  ? ? ?Current Medications (verified) ?Outpatient Encounter Medications as of 04/08/2022  ?Medication Sig  ? aspirin EC 81 MG tablet Take 81 mg by mouth daily.  ? atorvastatin (LIPITOR) 80 MG tablet TAKE 1 TABLET BY MOUTH  DAILY AT 6PM. Please keep upcoming appt in April 2023 with Dr. CBurt Knackbefore anymore refills. Thank you Final Attempt  ? cetirizine (ZYRTEC) 10 MG tablet Take 10 mg by mouth daily.  ? Cholecalciferol (VITAMIN D-3) 1000 UNITS CAPS Take 5,000 Units by mouth daily.   ? famotidine (PEPCID) 20 MG tablet Take 1 tablet (20 mg total) by mouth 2 (two) times daily.  ? finasteride (PROSCAR) 5 MG tablet Take 5 mg by mouth daily.  ? fluticasone (FLONASE) 50 MCG/ACT nasal spray USE 2 SPRAYS IN EACH  NOSTRIL DAILY  ? folic acid (FOLVITE) 1 MG tablet Take 1 mg by mouth daily.  ? hydrochlorothiazide (HYDRODIURIL) 25 MG tablet 1 tablet in the morning  ? HYDROcodone-acetaminophen (NORCO/VICODIN) 5-325 MG tablet 1 tablet as needed  ? meloxicam (MOBIC) 7.5 MG tablet Take 7.5 mg by mouth 2 (two) times daily.  ? methotrexate (RHEUMATREX) 2.5 MG tablet Take 15 mg by mouth once a week. Caution:Chemotherapy. Protect from light.  ? metoprolol tartrate (LOPRESSOR) 25 MG tablet Take 0.5 tablets (12.5 mg total) by mouth 2 (two) times daily.  ? nitroGLYCERIN (NITROSTAT) 0.4 MG SL tablet Place 1 tablet under the tongue as needed.  Every 5 minutes for chest pain  ? Probiotic Product (PRO-BIOTIC BLEND PO) Take 1 tablet by mouth daily.  ? SODIUM FLUORIDE 5000 PPM 1.1 % PSTE Take by mouth as directed.  ? tamsulosin (FLOMAX) 0.4 MG CAPS capsule Take 0.4 mg by mouth at bedtime.  ? traMADol (ULTRAM) 50 MG tablet Take 50 mg by mouth every 6 (six) hours as needed for moderate pain.  ? traZODone (DESYREL) 50 MG tablet Take 1 tablet by mouth every night at bedtime  ? metFORMIN (GLUCOPHAGE-XR) 750 MG 24 hr tablet Take 1 tablet (750 mg total) by mouth 2 (two) times daily.  ? ?No  facility-administered encounter medications on file as of 04/08/2022.  ? ? ?Allergies (verified) ?Penicillins, Contrast media [iodinated contrast media], Sulfamethoxazole, and Ibuprofen  ? ?History: ?Past Medical History:  ?Diagnosis Date  ? Allergic rhinitis   ? Arthritis   ? CAD (coronary artery disease)   ? a. 11/11/15 - OOH VF arrest 2/2 ant STEMI >> CPR//AED shock x 2 >> ROSC >> LHC: pLAD 100 (Promus DES), mLCx 50, mRCA 30, EF 25% [managed with hypothermia protocol]  ? Fractured rib   ? GERD (gastroesophageal reflux disease)   ? History of Clostridium difficile colitis 10/2015  ? post MI >> required IV Flagyl and IV Vanc >> DC on po Vanc  ? History of kidney stones   ? History of lithotripsy   ? HLD (hyperlipidemia)   ? Hypertension   ? Impaired glucose tolerance   ? Insomnia   ? Ischemic cardiomyopathy   ? a. EF at [facility] 11/11/15 25%;  b. Echo 11/14/15: EF 50-55%, dist ant and anterior septal HK  ? Nephrolithiasis 1968  ? Pelvic fracture ([place]) 09/2015  ? Pre-diabetes   ? Rheumatoid arthritis(714.0) 1998  ? ST elevation (STEMI) myocardial infarction involving left anterior descending coronary artery ([place]) 11/11/15  ? 100% LAD, Cardiogenic Shock. -- EF ~20-25%  ? ?Past Surgical History:  ?Procedure Laterality Date  ? CARDIAC CATHETERIZATION N/A 11/11/2015  ? Procedure: Left Heart Cath and Cors/Grafts Angiography;  Surgeon: Sherren Mocha, MD;  Location: [facility];  Service: Cardiovascular;  Laterality: N/A;  ? CARDIAC CATHETERIZATION N/A 11/11/2015  ? Procedure: Coronary Stent Intervention;  Surgeon: Sherren Mocha, MD;  Location: [facility];  Service: Cardiovascular;  Laterality: N/A;  prox lad 3.5x38 promus  ? CORONARY ANGIOPLASTY WITH STENT PLACEMENT  2016  ? EXTRACORPOREAL SHOCK WAVE LITHOTRIPSY Left 08/29/2017  ? Procedure: LEFT EXTRACORPOREAL SHOCK WAVE LITHOTRIPSY (ESWL);  Surgeon: Raynelle Bring, MD;  [facility];  Service: Urology;  Laterality: Left;  ? status post multiple lithotripy  and urological surgery for reccurent calcium oxalate stones    ? ?Family History  ?Problem Relation Age of Onset  ? Cancer Mother   ?     pancreatic ; pulmonary embolism   ? Diabetes Father   ? ?Social History  ? ?Socioeconomic History  ? Marital status: Married  ?  Spouse name: Not on file  ? Number of children: Not on file  ? Years of education: Not on file  ? Highest education level: Not on file  ?Occupational History  ? Occupation: retired  ?Tobacco Use  ? Smoking status: Former  ? Smokeless tobacco: Never  ? Tobacco comments:  ?  quit 30 yrs ago  ?Vaping Use  ? Vaping Use: Never used  ?Substance and Sexual Activity  ? Alcohol use: No  ? Drug use: No  ? Sexual activity: Not on file  ?Other  Topics Concern  ? Not on file  ?Social History Narrative  ? Not on file  ? ?Social Determinants of Health  ? ?Financial Resource Strain: Not on file  ?Food Insecurity: No Food Insecurity  ? Worried About Charity fundraiser in the Last Year: Never true  ? Ran Out of Food in the Last Year: Never true  ?Transportation Needs: No Transportation Needs  ? Lack of Transportation (Medical): No  ? Lack of Transportation (Non-Medical): No  ?Physical Activity: Sufficiently Active  ? Days of Exercise per Week: 5 days  ? Minutes of Exercise per Session: 60 min  ?Stress: No Stress Concern Present  ? Feeling of Stress : Not at all  ?Social Connections: Socially Isolated  ? Frequency of Communication with Friends and Family: Once a week  ? Frequency of Social Gatherings with Friends and Family: Once a week  ? Attends Religious Services: Never  ? Active Member of Clubs or Organizations: No  ? Attends Archivist Meetings: Never  ? Marital Status: Married  ? ? ?Tobacco Counseling ?Counseling given: Not Answered ?Tobacco comments: quit 30 yrs ago ? ? ?Clinical Intake: ? ?Pre-visit preparation completed: Yes ? ?Pain : No/denies pain ? ?  ? ?BMI - recorded: 25.91 ?Nutritional Status: BMI 25 -29 Overweight ?Nutritional Risks:  None ?Diabetes: Yes ?CBG done?: No ?Did pt. bring in CBG monitor from home?: No ? ?How often do you need to have someone help you when you read instructions, pamphlets, or other written materials from your doctor or pharmac

## 2022-04-08 NOTE — Patient Instructions (Signed)
[name] , ?Thank you for taking time to come for your Medicare Wellness Visit. I appreciate your ongoing commitment to your health goals. Please review the following plan we discussed and let me know if I can assist you in the future.  ? ?Screening recommendations/referrals: ?Colonoscopy: 08/30/20 repeat cologuard every 3 years  ?Recommended yearly ophthalmology/optometry visit for glaucoma screening and checkup ?Recommended yearly dental visit for hygiene and checkup ? ?Vaccinations: ?Influenza vaccine: Done 08/19/21 repeat every year  ?Pneumococcal vaccine: Up to date ?Tdap vaccine: Done 12/25/13 repeat every 10 years  ?Shingles vaccine: Completed 3/22 & 06/12/21   ?Covid-19: Completed 2/4, 3/2, 08/06/20 & 11/19/21 ? ?Advanced directives: Please bring a copy of your health care power of attorney and living will to the office at your convenience. ? ?Conditions/risks identified: None at this time  ? ?Next appointment: Follow up in one year for your annual wellness visit.  ? ?Preventive Care 36 Years and Older, Male ?Preventive care refers to lifestyle choices and visits with your health care provider that can promote health and wellness. ?What does preventive care include? ?A yearly physical exam. This is also called an annual well check. ?Dental exams once or twice a year. ?Routine eye exams. Ask your health care provider how often you should have your eyes checked. ?Personal lifestyle choices, including: ?Daily care of your teeth and gums. ?Regular physical activity. ?Eating a healthy diet. ?Avoiding tobacco and drug use. ?Limiting alcohol use. ?Practicing safe sex. ?Taking low doses of aspirin every day. ?Taking vitamin and mineral supplements as recommended by your health care provider. ?What happens during an annual well check? ?The services and screenings done by your health care provider during your annual well check will depend on your age, overall health, lifestyle risk factors, and family history of  disease. ?Counseling  ?Your health care provider may ask you questions about your: ?Alcohol use. ?Tobacco use. ?Drug use. ?Emotional well-being. ?Home and relationship well-being. ?Sexual activity. ?Eating habits. ?History of falls. ?Memory and ability to understand (cognition). ?Work and work Statistician. ?Screening  ?You may have the following tests or measurements: ?Height, weight, and BMI. ?Blood pressure. ?Lipid and cholesterol levels. These may be checked every 5 years, or more frequently if you are over 49 years old. ?Skin check. ?Lung cancer screening. You may have this screening every year starting at age 5 if you have a 30-pack-year history of smoking and currently smoke or have quit within the past 15 years. ?Fecal occult blood test (FOBT) of the stool. You may have this test every year starting at age 36. ?Flexible sigmoidoscopy or colonoscopy. You may have a sigmoidoscopy every 5 years or a colonoscopy every 10 years starting at age 42. ?Prostate cancer screening. Recommendations will vary depending on your family history and other risks. ?Hepatitis C blood test. ?Hepatitis B blood test. ?Sexually transmitted disease (STD) testing. ?Diabetes screening. This is done by checking your blood sugar (glucose) after you have not eaten for a while (fasting). You may have this done every 1-3 years. ?Abdominal aortic aneurysm (AAA) screening. You may need this if you are a current or former smoker. ?Osteoporosis. You may be screened starting at age 25 if you are at high risk. ?Talk with your health care provider about your test results, treatment options, and if necessary, the need for more tests. ?Vaccines  ?Your health care provider may recommend certain vaccines, such as: ?Influenza vaccine. This is recommended every year. ?Tetanus, diphtheria, and acellular pertussis (Tdap, Td) vaccine. You may need  a Td booster every 10 years. ?Zoster vaccine. You may need this after age 36. ?Pneumococcal 13-valent  conjugate (PCV13) vaccine. One dose is recommended after age 52. ?Pneumococcal polysaccharide (PPSV23) vaccine. One dose is recommended after age 41. ?Talk to your health care provider about which screenings and vaccines you need and how often you need them. ?This information is not intended to replace advice given to you by your health care provider. Make sure you discuss any questions you have with your health care provider. ?Document Released: 12/26/2015 Document Revised: 08/18/2016 Document Reviewed: 09/30/2015 ?Elsevier Interactive Patient Education ? 2017 [employer]. ? ?Fall Prevention in the Home ?Falls can cause injuries. They can happen to people of all ages. There are many things you can do to make your home safe and to help prevent falls. ?What can I do on the outside of my home? ?Regularly fix the edges of walkways and driveways and fix any cracks. ?Remove anything that might make you trip as you walk through a door, such as a raised step or threshold. ?Trim any bushes or trees on the path to your home. ?Use bright outdoor lighting. ?Clear any walking paths of anything that might make someone trip, such as rocks or tools. ?Regularly check to see if handrails are loose or broken. Make sure that both sides of any steps have handrails. ?Any raised decks and porches should have guardrails on the edges. ?Have any leaves, snow, or ice cleared regularly. ?Use sand or salt on walking paths during winter. ?Clean up any spills in your garage right away. This includes oil or grease spills. ?What can I do in the bathroom? ?Use night lights. ?Install grab bars by the toilet and in the tub and shower. Do not use towel bars as grab bars. ?Use non-skid mats or decals in the tub or shower. ?If you need to sit down in the shower, use a plastic, non-slip stool. ?Keep the floor dry. Clean up any water that spills on the floor as soon as it happens. ?Remove soap buildup in the tub or shower regularly. ?Attach bath mats  securely with double-sided non-slip rug tape. ?Do not have throw rugs and other things on the floor that can make you trip. ?What can I do in the bedroom? ?Use night lights. ?Make sure that you have a light by your bed that is easy to reach. ?Do not use any sheets or blankets that are too big for your bed. They should not hang down onto the floor. ?Have a firm chair that has side arms. You can use this for support while you get dressed. ?Do not have throw rugs and other things on the floor that can make you trip. ?What can I do in the kitchen? ?Clean up any spills right away. ?Avoid walking on wet floors. ?Keep items that you use a lot in easy-to-reach places. ?If you need to reach something above you, use a strong step stool that has a grab bar. ?Keep electrical cords out of the way. ?Do not use floor polish or wax that makes floors slippery. If you must use wax, use non-skid floor wax. ?Do not have throw rugs and other things on the floor that can make you trip. ?What can I do with my stairs? ?Do not leave any items on the stairs. ?Make sure that there are handrails on both sides of the stairs and use them. Fix handrails that are broken or loose. Make sure that handrails are as long as the stairways. ?Check  any carpeting to make sure that it is firmly attached to the stairs. Fix any carpet that is loose or worn. ?Avoid having throw rugs at the top or bottom of the stairs. If you do have throw rugs, attach them to the floor with carpet tape. ?Make sure that you have a light switch at the top of the stairs and the bottom of the stairs. If you do not have them, ask someone to add them for you. ?What else can I do to help prevent falls? ?Wear shoes that: ?Do not have high heels. ?Have rubber bottoms. ?Are comfortable and fit you well. ?Are closed at the toe. Do not wear sandals. ?If you use a stepladder: ?Make sure that it is fully opened. Do not climb a closed stepladder. ?Make sure that both sides of the stepladder  are locked into place. ?Ask someone to hold it for you, if possible. ?Clearly mark and make sure that you can see: ?Any grab bars or handrails. ?First and last steps. ?Where the edge of each step is. ?Use tools that hel

## 2022-04-14 DIAGNOSIS — M542 Cervicalgia: Secondary | ICD-10-CM | POA: Diagnosis not present

## 2022-04-23 ENCOUNTER — Other Ambulatory Visit: Payer: Self-pay

## 2022-04-23 MED ORDER — ATORVASTATIN CALCIUM 80 MG PO TABS: ORAL_TABLET | ORAL | 3 refills | Status: DC

## 2022-05-11 ENCOUNTER — Encounter: Payer: Self-pay | Admitting: Family Medicine

## 2022-05-11 NOTE — Telephone Encounter (Signed)
With his risk factors it would be a good idea for him to get one.  Algis Greenhouse. Jerline Pain, MD 05/11/2022 1:35 PM

## 2022-05-11 NOTE — Telephone Encounter (Signed)
Please advise 

## 2022-05-13 DIAGNOSIS — M0589 Other rheumatoid arthritis with rheumatoid factor of multiple sites: Secondary | ICD-10-CM | POA: Diagnosis not present

## 2022-05-17 ENCOUNTER — Other Ambulatory Visit: Payer: Self-pay | Admitting: *Deleted

## 2022-05-17 MED ORDER — TRAZODONE HCL 50 MG PO TABS
50.0000 mg | ORAL_TABLET | Freq: Every day | ORAL | 1 refills | Status: DC
Start: 2022-05-17 — End: 2022-12-08

## 2022-08-04 ENCOUNTER — Other Ambulatory Visit: Payer: Self-pay | Admitting: *Deleted

## 2022-08-04 MED ORDER — METFORMIN HCL ER 750 MG PO TB24: 750.0000 mg | ORAL_TABLET | Freq: Two times a day (BID) | ORAL | 0 refills | Status: DC

## 2022-08-17 DIAGNOSIS — M542 Cervicalgia: Secondary | ICD-10-CM | POA: Diagnosis not present

## 2022-08-17 DIAGNOSIS — M0589 Other rheumatoid arthritis with rheumatoid factor of multiple sites: Secondary | ICD-10-CM | POA: Diagnosis not present

## 2022-08-17 DIAGNOSIS — M25511 Pain in right shoulder: Secondary | ICD-10-CM | POA: Diagnosis not present

## 2022-08-17 DIAGNOSIS — M1991 Primary osteoarthritis, unspecified site: Secondary | ICD-10-CM | POA: Diagnosis not present

## 2022-08-17 DIAGNOSIS — E663 Overweight: Secondary | ICD-10-CM | POA: Diagnosis not present

## 2022-08-17 DIAGNOSIS — Z6826 Body mass index (BMI) 26.0-26.9, adult: Secondary | ICD-10-CM | POA: Diagnosis not present

## 2022-08-23 ENCOUNTER — Ambulatory Visit (INDEPENDENT_AMBULATORY_CARE_PROVIDER_SITE_OTHER): Payer: HMO | Admitting: Family Medicine

## 2022-08-23 ENCOUNTER — Encounter: Payer: Self-pay | Admitting: Family Medicine

## 2022-08-23 VITALS — BP 119/75 | HR 83 | Temp 97.5°F | Ht 70.0 in | Wt 176.6 lb

## 2022-08-23 DIAGNOSIS — Z23 Encounter for immunization: Secondary | ICD-10-CM

## 2022-08-23 DIAGNOSIS — E1159 Type 2 diabetes mellitus with other circulatory complications: Secondary | ICD-10-CM | POA: Diagnosis not present

## 2022-08-23 DIAGNOSIS — M069 Rheumatoid arthritis, unspecified: Secondary | ICD-10-CM

## 2022-08-23 DIAGNOSIS — I152 Hypertension secondary to endocrine disorders: Secondary | ICD-10-CM

## 2022-08-23 DIAGNOSIS — E1151 Type 2 diabetes mellitus with diabetic peripheral angiopathy without gangrene: Secondary | ICD-10-CM

## 2022-08-23 DIAGNOSIS — I10 Essential (primary) hypertension: Secondary | ICD-10-CM

## 2022-08-23 LAB — POCT GLYCOSYLATED HEMOGLOBIN (HGB A1C): Hemoglobin A1C: 7.1 % — AB (ref 4.0–5.6)

## 2022-08-23 NOTE — Patient Instructions (Addendum)
It was very nice to see you today!  Your A1c is stable today. No medications changes today.  I will see you back in 6 months for your annual physical.   Take care, Dr Jerline Pain  PLEASE NOTE:  If you had any lab tests please let us know if you have not heard back within a few days. You may see your results on mychart before we have a chance to review them but we will give you a call once they are reviewed by [place]. If we ordered any referrals today, please let us know if you have not heard from their office within the next week.   Please try these tips to maintain a healthy lifestyle:  Eat at least 3 REAL meals and 1-2 snacks per day.  Aim for no more than 5 hours between eating.  If you eat breakfast, please do so within one hour of getting up.   Each meal should contain half fruits/vegetables, one quarter protein, and one quarter carbs (no bigger than a computer mouse)  Cut down on sweet beverages. This includes juice, soda, and sweet tea.   Drink at least 1 glass of water with each meal and aim for at least 8 glasses per day  Exercise at least 150 minutes every week.

## 2022-08-23 NOTE — Assessment & Plan Note (Signed)
Blood pressure at goal on lopressor 12.'5mg'$  daily and HCTZ '25mg'$  daily

## 2022-08-23 NOTE — Assessment & Plan Note (Signed)
A1c stable at 7.1. We will continue metformin '750mg'$  twice daily. Discussed  Lifestyle modifications. Will recheck in 6 months.

## 2022-08-23 NOTE — Progress Notes (Signed)
   [name] is a 76 y.o. male who presents today for an office visit.  Assessment/Plan:  Chronic Problems Addressed Today: DM (diabetes mellitus), type 2 with peripheral vascular complications ([name]) S5K stable at 7.1. We will continue metformin '750mg'$  twice daily. Discussed  Lifestyle modifications. Will recheck in 6 months.   Rheumatoid arthritis ([place]) Stable on management per rheumtatology.   Hypertension associated with diabetes ([place]) Blood pressure at goal on lopressor 12.'5mg'$  daily and HCTZ '25mg'$  daily   Flu shot given today.    Subjective:  HPI:  See A/p for status of chronic conditions.         Objective:  Physical Exam: BP 119/75   Pulse 83   Temp (!) 97.5 F (36.4 C) (Temporal)   Ht '5\' 10"'$  (1.778 m)   Wt 176 lb 9.6 oz (80.1 kg)   SpO2 95%   BMI 25.34 kg/m   Gen: No acute distress, resting comfortably CV: Regular rate and rhythm with no murmurs appreciated Pulm: Normal work of breathing, clear to auscultation bilaterally with no crackles, wheezes, or rhonchi Neuro: Grossly normal, moves all extremities Psych: Normal affect and thought content      Giorgio Chabot M. Jerline Pain, MD 08/23/2022 10:53 AM

## 2022-08-23 NOTE — Assessment & Plan Note (Signed)
Stable on management per rheumtatology.

## 2022-08-24 LAB — HM DIABETES EYE EXAM

## 2022-09-06 ENCOUNTER — Encounter: Payer: Self-pay | Admitting: Family Medicine

## 2022-09-21 ENCOUNTER — Encounter: Payer: Self-pay | Admitting: Family Medicine

## 2022-09-23 NOTE — Telephone Encounter (Signed)
I appreciate the update. I think getting the RSV vaccine is a good idea.  Algis Greenhouse. Jerline Pain, MD 09/23/2022 10:14 AM

## 2022-09-28 ENCOUNTER — Encounter: Payer: Self-pay | Admitting: Family Medicine

## 2022-10-08 ENCOUNTER — Other Ambulatory Visit: Payer: Self-pay | Admitting: *Deleted

## 2022-10-08 MED ORDER — METFORMIN HCL ER 750 MG PO TB24: 750.0000 mg | ORAL_TABLET | Freq: Two times a day (BID) | ORAL | 0 refills | Status: DC

## 2022-11-17 LAB — CBC: RBC: 4.7 (ref 3.87–5.11)

## 2022-11-17 LAB — COMPREHENSIVE METABOLIC PANEL
Albumin: 4.5 (ref 3.5–5.0)
Calcium: 10.2 (ref 8.7–10.7)
Globulin: 1.8
eGFR: 56

## 2022-11-17 LAB — BASIC METABOLIC PANEL
BUN: 19 (ref 4–21)
CO2: 25 — AB (ref 13–22)
Chloride: 100 (ref 99–108)
Creatinine: 1.3 (ref 0.6–1.3)
Glucose: 262
Potassium: 3.7 mEq/L (ref 3.5–5.1)
Sodium: 141 (ref 137–147)

## 2022-11-17 LAB — HEPATIC FUNCTION PANEL
ALT: 27 U/L (ref 10–40)
AST: 21 (ref 14–40)
Alkaline Phosphatase: 108 (ref 25–125)
Bilirubin, Total: 0.5

## 2022-11-17 LAB — CBC AND DIFFERENTIAL
HCT: 45 (ref 41–53)
Hemoglobin: 15.4 (ref 13.5–17.5)
Neutrophils Absolute: 4.6
Platelets: 238 10*3/uL (ref 150–400)
WBC: 6.5

## 2022-12-08 ENCOUNTER — Other Ambulatory Visit: Payer: Self-pay | Admitting: Family Medicine

## 2022-12-16 DIAGNOSIS — R749 Abnormal serum enzyme level, unspecified: Secondary | ICD-10-CM | POA: Diagnosis not present

## 2022-12-16 DIAGNOSIS — Z79899 Other long term (current) drug therapy: Secondary | ICD-10-CM | POA: Diagnosis not present

## 2022-12-22 ENCOUNTER — Other Ambulatory Visit: Payer: Self-pay | Admitting: *Deleted

## 2022-12-22 MED ORDER — METFORMIN HCL ER 750 MG PO TB24: 750.0000 mg | ORAL_TABLET | Freq: Two times a day (BID) | ORAL | 0 refills | Status: DC

## 2023-02-02 ENCOUNTER — Encounter: Payer: Self-pay | Admitting: Family Medicine

## 2023-02-15 DIAGNOSIS — M542 Cervicalgia: Secondary | ICD-10-CM | POA: Diagnosis not present

## 2023-02-15 DIAGNOSIS — M0589 Other rheumatoid arthritis with rheumatoid factor of multiple sites: Secondary | ICD-10-CM | POA: Diagnosis not present

## 2023-02-15 DIAGNOSIS — M1991 Primary osteoarthritis, unspecified site: Secondary | ICD-10-CM | POA: Diagnosis not present

## 2023-02-15 DIAGNOSIS — Z6826 Body mass index (BMI) 26.0-26.9, adult: Secondary | ICD-10-CM | POA: Diagnosis not present

## 2023-02-15 DIAGNOSIS — R7989 Other specified abnormal findings of blood chemistry: Secondary | ICD-10-CM | POA: Diagnosis not present

## 2023-02-15 DIAGNOSIS — E663 Overweight: Secondary | ICD-10-CM | POA: Diagnosis not present

## 2023-02-15 DIAGNOSIS — M25511 Pain in right shoulder: Secondary | ICD-10-CM | POA: Diagnosis not present

## 2023-02-15 LAB — BASIC METABOLIC PANEL
BUN: 23 — AB (ref 4–21)
BUN: 27 — AB (ref 4–21)
CO2: 21 (ref 13–22)
Chloride: 101 (ref 99–108)
Creatinine: 1.2 (ref 0.6–1.3)
Glucose: 229
Potassium: 3.8 mEq/L (ref 3.5–5.1)
Sodium: 141 (ref 137–147)

## 2023-02-15 LAB — HEPATIC FUNCTION PANEL
ALT: 32 U/L (ref 10–40)
AST: 25 (ref 14–40)
Alkaline Phosphatase: 139 — AB (ref 25–125)
Bilirubin, Total: 0.4

## 2023-02-15 LAB — CBC AND DIFFERENTIAL
HCT: 46 (ref 41–53)
Hemoglobin: 15.9 (ref 13.5–17.5)
Neutrophils Absolute: 4.8
Platelets: 232 10*3/uL (ref 150–400)
WBC: 6.5

## 2023-02-15 LAB — COMPREHENSIVE METABOLIC PANEL
Albumin: 4.9 (ref 3.5–5.0)
Calcium: 9.8 (ref 8.7–10.7)
Globulin: 2
eGFR: 63

## 2023-02-15 LAB — CBC: RBC: 5.01 (ref 3.87–5.11)

## 2023-02-17 DIAGNOSIS — N401 Enlarged prostate with lower urinary tract symptoms: Secondary | ICD-10-CM | POA: Diagnosis not present

## 2023-02-17 DIAGNOSIS — N200 Calculus of kidney: Secondary | ICD-10-CM | POA: Diagnosis not present

## 2023-02-17 DIAGNOSIS — R351 Nocturia: Secondary | ICD-10-CM | POA: Diagnosis not present

## 2023-02-21 ENCOUNTER — Encounter: Payer: Self-pay | Admitting: Family Medicine

## 2023-02-21 ENCOUNTER — Ambulatory Visit (INDEPENDENT_AMBULATORY_CARE_PROVIDER_SITE_OTHER): Payer: PPO | Admitting: Family Medicine

## 2023-02-21 ENCOUNTER — Other Ambulatory Visit: Payer: Self-pay

## 2023-02-21 VITALS — BP 134/69 | HR 82 | Temp 98.2°F | Ht 70.0 in | Wt 178.8 lb

## 2023-02-21 DIAGNOSIS — E785 Hyperlipidemia, unspecified: Secondary | ICD-10-CM | POA: Diagnosis not present

## 2023-02-21 DIAGNOSIS — E1159 Type 2 diabetes mellitus with other circulatory complications: Secondary | ICD-10-CM | POA: Diagnosis not present

## 2023-02-21 DIAGNOSIS — M069 Rheumatoid arthritis, unspecified: Secondary | ICD-10-CM

## 2023-02-21 DIAGNOSIS — Z0001 Encounter for general adult medical examination with abnormal findings: Secondary | ICD-10-CM | POA: Diagnosis not present

## 2023-02-21 DIAGNOSIS — E1151 Type 2 diabetes mellitus with diabetic peripheral angiopathy without gangrene: Secondary | ICD-10-CM | POA: Diagnosis not present

## 2023-02-21 DIAGNOSIS — I152 Hypertension secondary to endocrine disorders: Secondary | ICD-10-CM

## 2023-02-21 DIAGNOSIS — E1169 Type 2 diabetes mellitus with other specified complication: Secondary | ICD-10-CM | POA: Diagnosis not present

## 2023-02-21 DIAGNOSIS — I10 Essential (primary) hypertension: Secondary | ICD-10-CM

## 2023-02-21 LAB — POCT GLYCOSYLATED HEMOGLOBIN (HGB A1C): Hemoglobin A1C: 7.7 % — AB (ref 4.0–5.6)

## 2023-02-21 MED ORDER — METOPROLOL TARTRATE 25 MG PO TABS: 12.5000 mg | ORAL_TABLET | Freq: Two times a day (BID) | ORAL | 0 refills | Status: DC

## 2023-02-21 NOTE — Progress Notes (Signed)
Chief Complaint:  [name] is a 77 y.o. male who presents today for his annual comprehensive physical exam.    Assessment/Plan:  Chronic Problems Addressed Today: DM (diabetes mellitus), type 2 with peripheral vascular complications ([place]) [id number] up slightly to 7.7.  We discussed lifestyle modifications.  He is currently on metformin 750 mg twice daily.  Will continue current regimen for now.  He will work harder on diet and exercise.  We will recheck in 3 to 6 months.  If A1c is not at goal would consider GLP-1 agonist such as Ozempic or Mounjaro vs starting SGLT2 inhibitor such as [place].  Hypertension associated with diabetes ([place]) Blood pressure at goal on Lopressor 12.5 mg daily and HCTZ 25 mg daily.  Rheumatoid arthritis ([place]) Stable on management per rheumatology.  Hyperlipidemia due to type 2 diabetes mellitus ([name]) Stable on Lipitor 80 mg daily.  Due for lipid panel next month.  He can discuss this with cardiology at his follow-up visit with them.  Preventative Healthcare: No longer needs colon cancer screening due to age.  Recently had labs done with rheumatology.  Due for lipids though he can have this done via cardiology.  Patient Counseling(The following topics were reviewed and/or handout was given):  -Nutrition: Stressed importance of moderation in sodium/caffeine intake, saturated fat and cholesterol, caloric balance, sufficient intake of fresh fruits, vegetables, and fiber.  -Stressed the importance of regular exercise.   -Substance Abuse: Discussed cessation/primary prevention of tobacco, alcohol, or other drug use; driving or other dangerous activities under the influence; availability of treatment for abuse.   -Injury prevention: Discussed safety belts, safety helmets, smoke detector, smoking near bedding or upholstery.   -Sexuality: Discussed sexually transmitted diseases, partner selection, use of condoms, avoidance of unintended pregnancy and contraceptive  alternatives.   -Dental health: Discussed importance of regular tooth brushing, flossing, and dental visits.  -Health maintenance and immunizations reviewed. Please refer to Health maintenance section.  Return to care in 1 year for next preventative visit.     Subjective:  HPI:  He has no acute complaints today.   Recently had labs with rheumatology. His glucose was elevated but everything else was stable.   Lifestyle Diet: Balanced. Plenty of fruits and vegetables.  Exercise: Trying get back into bike riding. Does a lot of yard work.      02/21/2023   10:03 AM  Depression screen PHQ 2/9  Decreased Interest 0  Down, Depressed, Hopeless 0  PHQ - 2 Score 0    Health Maintenance Due  Topic Date Due   Diabetic kidney evaluation - Urine ACR  07/27/2018   FOOT EXAM  08/19/2022   Medicare Annual Wellness (AWV)  04/09/2023    ROS: Per HPI, otherwise a complete review of systems was negative.   PMH:  The following were reviewed and entered/updated in epic: Past Medical History:  Diagnosis Date   Allergic rhinitis    Arthritis    CAD (coronary artery disease)    a. 11/11/15 - OOH VF arrest 2/2 ant STEMI >> CPR//AED shock x 2 >> ROSC >> LHC: pLAD 100 (Promus DES), mLCx 53, mRCA 30, EF 25% [managed with hypothermia protocol]   Fractured rib    GERD (gastroesophageal reflux disease)    History of Clostridium difficile colitis 10/2015   post MI >> required IV Flagyl and IV Vanc >> DC on po Vanc   History of kidney stones    History of lithotripsy    HLD (hyperlipidemia)  Hypertension    Impaired glucose tolerance    Insomnia    Ischemic cardiomyopathy    a. EF at [facility] 11/11/15 25%;  b. Echo 11/14/15: EF 50-55%, dist ant and anterior septal HK   Nephrolithiasis 1968   Pelvic fracture ([place]) 09/2015   Pre-diabetes    Rheumatoid arthritis(714.0) 1998   ST elevation (STEMI) myocardial infarction involving left anterior descending coronary artery ([place]) 11/11/15   100% LAD,  Cardiogenic Shock. -- EF ~20-25%   Patient Active Problem List   Diagnosis Date Noted   Sciatica 08/13/2020   Fatigue 01/01/2019   Abdominal bloating 06/29/2018   DM (diabetes mellitus), type 2 with peripheral vascular complications ([place]) [id number]   Hyperlipidemia due to type 2 diabetes mellitus ([place]) 12/22/2015   CAD S/P PCI OF pLAD with 3.5 mm x38 mm Promus DES (3.75 mm) 11/12/2015   Cardiomyopathy, ischemic - EF 20-25% by LV Gram 11/12/2015   Hypertension associated with diabetes ([place]) 03/24/2010   TOBACCO USE, QUIT 09/23/2009   Allergic rhinitis 12/18/2008   Rheumatoid arthritis ([place]) 12/18/2008   INSOMNIA 12/18/2008   Nephrolithiasis 12/18/2008   Past Surgical History:  Procedure Laterality Date   CARDIAC CATHETERIZATION N/A 11/11/2015   Procedure: Left Heart Cath and Cors/Grafts Angiography;  Surgeon: Sherren Mocha, MD;  Location: [facility];  Service: Cardiovascular;  Laterality: N/A;   CARDIAC CATHETERIZATION N/A 11/11/2015   Procedure: Coronary Stent Intervention;  Surgeon: Sherren Mocha, MD;  Location: [facility];  Service: Cardiovascular;  Laterality: N/A;  prox lad 3.5x38 promus   CORONARY ANGIOPLASTY WITH STENT PLACEMENT  2016   EXTRACORPOREAL SHOCK WAVE LITHOTRIPSY Left 08/29/2017   Procedure: LEFT EXTRACORPOREAL SHOCK WAVE LITHOTRIPSY (ESWL);  Surgeon: Raynelle Bring, MD;  Location: [facility];  Service: Urology;  Laterality: Left;   status post multiple lithotripy and urological surgery for reccurent calcium oxalate stones      Family History  Problem Relation Age of Onset   Cancer Mother        pancreatic ; pulmonary embolism    Diabetes Father     Medications- reviewed and updated Current Outpatient Medications  Medication Sig Dispense Refill   aspirin EC 81 MG tablet Take 81 mg by mouth daily.     atorvastatin (LIPITOR) 80 MG tablet TAKE 1 TABLET BY MOUTH  DAILY AT 6PM. 90 tablet 3   cetirizine (ZYRTEC) 10 MG tablet Take 10 mg by mouth  daily.     Cholecalciferol (VITAMIN D-3) 1000 UNITS CAPS Take 5,000 Units by mouth daily.      famotidine (PEPCID) 20 MG tablet Take 1 tablet (20 mg total) by mouth 2 (two) times daily. 180 tablet 0   finasteride (PROSCAR) 5 MG tablet Take 5 mg by mouth daily.     fluticasone (FLONASE) 50 MCG/ACT nasal spray USE 2 SPRAYS IN EACH  NOSTRIL DAILY 48 g 3   folic acid (FOLVITE) 1 MG tablet Take 1 mg by mouth daily.     hydrochlorothiazide (HYDRODIURIL) 25 MG tablet 1 tablet in the morning     meloxicam (MOBIC) 7.5 MG tablet Take 7.5 mg by mouth 2 (two) times daily.     metFORMIN (GLUCOPHAGE-XR) 750 MG 24 hr tablet Take 1 tablet (750 mg total) by mouth 2 (two) times daily. 180 tablet 0   methotrexate (RHEUMATREX) 2.5 MG tablet Take 15 mg by mouth once a week. Caution:Chemotherapy. Protect from light.     metoprolol tartrate (LOPRESSOR) 25 MG tablet Take 0.5 tablets (12.5 mg total) by  mouth 2 (two) times daily. 90 tablet 3   nitroGLYCERIN (NITROSTAT) 0.4 MG SL tablet Place 1 tablet under the tongue as needed. Every 5 minutes for chest pain  3   Probiotic Product (PRO-BIOTIC BLEND PO) Take 1 tablet by mouth daily.     SODIUM FLUORIDE 5000 PPM 1.1 % PSTE Take by mouth as directed.     tamsulosin (FLOMAX) 0.4 MG CAPS capsule Take 0.4 mg by mouth at bedtime.     traMADol (ULTRAM) 50 MG tablet Take 50 mg by mouth every 6 (six) hours as needed for moderate pain.     traZODone (DESYREL) 50 MG tablet Take 1 tablet by mouth at bedtime 90 tablet 0   No current facility-administered medications for this visit.    Allergies-reviewed and updated Allergies  Allergen Reactions   Penicillins Anaphylaxis   Contrast Media [Iodinated Contrast Media] Hives    03/28/14 pt received 1 hour emergent prep and pt did good and had no complaints after scan when given IV benadryl first. Had reaction 1 time, tolerated fine in 03/2014 with Benadryl   Sulfamethoxazole Hives    Childhood reaction   Ibuprofen Swelling     Happened 1 time    Social History   Socioeconomic History   Marital status: Married    Spouse name: Not on file   Number of children: Not on file   Years of education: Not on file   Highest education level: Not on file  Occupational History   Occupation: retired  Tobacco Use   Smoking status: Former   Smokeless tobacco: Never   Tobacco comments:    quit 30 yrs ago  Vaping Use   Vaping Use: Never used  Substance and Sexual Activity   Alcohol use: No   Drug use: No   Sexual activity: Not on file  Other Topics Concern   Not on file  Social History Narrative   Not on file   Social Determinants of Health   Financial Resource Strain: Low Risk  (08/29/2020)   Overall Financial Resource Strain (CARDIA)    Difficulty of Paying Living Expenses: Not hard at all  Food Insecurity: No Food Insecurity (04/08/2022)   Hunger Vital Sign    Worried About Running Out of Food in the Last Year: Never true    [place] in the Last Year: Never true  Transportation Needs: No Transportation Needs (04/08/2022)   PRAPARE - Hydrologist (Medical): No    Lack of Transportation (Non-Medical): No  Physical Activity: Sufficiently Active (04/08/2022)   Exercise Vital Sign    Days of Exercise per Week: 5 days    Minutes of Exercise per Session: 60 min  Stress: No Stress Concern Present (04/08/2022)   [place]    Feeling of Stress : Not at all  Social Connections: Socially Isolated (04/08/2022)   Social Connection and Isolation Panel [NHANES]    Frequency of Communication with Friends and Family: Once a week    Frequency of Social Gatherings with Friends and Family: Once a week    Attends Religious Services: Never    Marine scientist or Organizations: No    Attends Archivist Meetings: Never    Marital Status: Married        Objective:  Physical Exam: BP 134/69   Pulse 82   Temp  98.2 F (36.8 C) (Temporal)   Ht '5\' 10"'$  (1.778 m)  Wt 178 lb 12.8 oz (81.1 kg)   SpO2 97%   BMI 25.66 kg/m   Body mass index is 25.66 kg/m. Wt Readings from Last 3 Encounters:  02/21/23 178 lb 12.8 oz (81.1 kg)  08/23/22 176 lb 9.6 oz (80.1 kg)  03/15/22 180 lb 9.6 oz (81.9 kg)   Gen: NAD, resting comfortably HEENT: TMs normal bilaterally. OP clear. No thyromegaly noted.  CV: RRR with no murmurs appreciated Pulm: NWOB, CTAB with no crackles, wheezes, or rhonchi GI: Normal bowel sounds present. Soft, Nontender, Nondistended. MSK: no edema, cyanosis, or clubbing noted Skin: warm, dry Neuro: CN2-12 grossly intact. Strength 5/5 in upper and lower extremities. Reflexes symmetric and intact bilaterally.  Psych: Normal affect and thought content     Faige Seely M. Jerline Pain, MD 02/21/2023 10:40 AM

## 2023-02-21 NOTE — Patient Instructions (Signed)
It was very nice to see you today!  Your A1c is up a bit at 7.7.  Please continue to work on diet and exercise.  No other changes today.  Please come back in 6 months for your A1c recheck.  Come back sooner if needed.  Take care, Dr Jerline Pain  PLEASE NOTE:  If you had any lab tests, please let us know if you have not heard back within a few days. You may see your results on mychart before we have a chance to review them but we will give you a call once they are reviewed by [place].   If we ordered any referrals today, please let us know if you have not heard from their office within the next week.   If you had any urgent prescriptions sent in today, please check with the pharmacy within an hour of our visit to make sure the prescription was transmitted appropriately.   Please try these tips to maintain a healthy lifestyle:  Eat at least 3 REAL meals and 1-2 snacks per day.  Aim for no more than 5 hours between eating.  If you eat breakfast, please do so within one hour of getting up.   Each meal should contain half fruits/vegetables, one quarter protein, and one quarter carbs (no bigger than a computer mouse)  Cut down on sweet beverages. This includes juice, soda, and sweet tea.   Drink at least 1 glass of water with each meal and aim for at least 8 glasses per day  Exercise at least 150 minutes every week.    Preventive Care 64 Years and Older, Male Preventive care refers to lifestyle choices and visits with your health care provider that can promote health and wellness. Preventive care visits are also called wellness exams. What can I expect for my preventive care visit? Counseling During your preventive care visit, your health care provider may ask about your: Medical history, including: Past medical problems. Family medical history. History of falls. Current health, including: Emotional well-being. Home life and relationship well-being. Sexual activity. Memory and ability to  understand (cognition). Lifestyle, including: Alcohol, nicotine or tobacco, and drug use. Access to firearms. Diet, exercise, and sleep habits. Work and work Statistician. Sunscreen use. Safety issues such as seatbelt and bike helmet use. Physical exam Your health care provider will check your: Height and weight. These may be used to calculate your BMI (body mass index). BMI is a measurement that tells if you are at a healthy weight. Waist circumference. This measures the distance around your waistline. This measurement also tells if you are at a healthy weight and may help predict your risk of certain diseases, such as type 2 diabetes and high blood pressure. Heart rate and blood pressure. Body temperature. Skin for abnormal spots. What immunizations do I need?  Vaccines are usually given at various ages, according to a schedule. Your health care provider will recommend vaccines for you based on your age, medical history, and lifestyle or other factors, such as travel or where you work. What tests do I need? Screening Your health care provider may recommend screening tests for certain conditions. This may include: Lipid and cholesterol levels. Diabetes screening. This is done by checking your blood sugar (glucose) after you have not eaten for a while (fasting). Hepatitis C test. Hepatitis B test. HIV (human immunodeficiency virus) test. STI (sexually transmitted infection) testing, if you are at risk. Lung cancer screening. Colorectal cancer screening. Prostate cancer screening. Abdominal aortic aneurysm (AAA) screening.  You may need this if you are a current or former smoker. Talk with your health care provider about your test results, treatment options, and if necessary, the need for more tests. Follow these instructions at home: Eating and drinking  Eat a diet that includes fresh fruits and vegetables, whole grains, lean protein, and low-fat dairy products. Limit your intake of  foods with high amounts of sugar, saturated fats, and salt. Take vitamin and mineral supplements as recommended by your health care provider. Do not drink alcohol if your health care provider tells you not to drink. If you drink alcohol: Limit how much you have to 0-2 drinks a day. Know how much alcohol is in your drink. In the [place] one drink equals one 12 oz bottle of beer (355 mL), one 5 oz glass of wine (148 mL), or one 1 oz glass of hard liquor (44 mL). Lifestyle Brush your teeth every morning and night with fluoride toothpaste. Floss one time each day. Exercise for at least 30 minutes 5 or more days each week. Do not use any products that contain nicotine or tobacco. These products include cigarettes, chewing tobacco, and vaping devices, such as e-cigarettes. If you need help quitting, ask your health care provider. Do not use drugs. If you are sexually active, practice safe sex. Use a condom or other form of protection to prevent STIs. Take aspirin only as told by your health care provider. Make sure that you understand how much to take and what form to take. Work with your health care provider to find out whether it is safe and beneficial for you to take aspirin daily. Ask your health care provider if you need to take a cholesterol-lowering medicine (statin). Find healthy ways to manage stress, such as: Meditation, yoga, or listening to music. Journaling. Talking to a trusted person. Spending time with friends and family. Safety Always wear your seat belt while driving or riding in a vehicle. Do not drive: If you have been drinking alcohol. Do not ride with someone who has been drinking. When you are tired or distracted. While texting. If you have been using any mind-altering substances or drugs. Wear a helmet and other protective equipment during sports activities. If you have firearms in your house, make sure you follow all gun safety procedures. Minimize exposure to UV  radiation to reduce your risk of skin cancer. What's next? Visit your health care provider once a year for an annual wellness visit. Ask your health care provider how often you should have your eyes and teeth checked. Stay up to date on all vaccines. This information is not intended to replace advice given to you by your health care provider. Make sure you discuss any questions you have with your health care provider. Document Revised: 05/27/2021 Document Reviewed: 05/27/2021 Elsevier Patient Education  [place].

## 2023-02-21 NOTE — Assessment & Plan Note (Signed)
Stable on management per rheumatology.

## 2023-02-21 NOTE — Assessment & Plan Note (Signed)
A1c up slightly to 7.7.  We discussed lifestyle modifications.  He is currently on metformin 750 mg twice daily.  Will continue current regimen for now.  He will work harder on diet and exercise.  We will recheck in 3 to 6 months.  If A1c is not at goal would consider GLP-1 agonist such as Ozempic or Mounjaro vs starting SGLT2 inhibitor such as [place].

## 2023-02-21 NOTE — Assessment & Plan Note (Signed)
Blood pressure at goal on Lopressor 12.5 mg daily and HCTZ 25 mg daily.

## 2023-02-21 NOTE — Assessment & Plan Note (Addendum)
Stable on Lipitor 80 mg daily.  Due for lipid panel next month.  He can discuss this with cardiology at his follow-up visit with them.

## 2023-02-22 ENCOUNTER — Encounter: Payer: Self-pay | Admitting: Family Medicine

## 2023-02-22 NOTE — Telephone Encounter (Signed)
Yes it would be a good idea for him to get it.  Algis Greenhouse. Jerline Pain, MD 02/22/2023 12:58 PM

## 2023-02-22 NOTE — Telephone Encounter (Signed)
Please advise 

## 2023-02-23 ENCOUNTER — Other Ambulatory Visit: Payer: Self-pay | Admitting: *Deleted

## 2023-02-23 DIAGNOSIS — H40013 Open angle with borderline findings, low risk, bilateral: Secondary | ICD-10-CM | POA: Diagnosis not present

## 2023-02-23 DIAGNOSIS — H40053 Ocular hypertension, bilateral: Secondary | ICD-10-CM | POA: Diagnosis not present

## 2023-02-23 MED ORDER — TRAZODONE HCL 50 MG PO TABS: 50.0000 mg | ORAL_TABLET | Freq: Every day | ORAL | 0 refills | Status: DC

## 2023-02-24 ENCOUNTER — Encounter: Payer: Self-pay | Admitting: Family Medicine

## 2023-02-24 NOTE — Telephone Encounter (Signed)
The efficacy is the same. It would be fine for him to get Moderna but he can wait or go to a different pharmacy if he prefers.  Algis Greenhouse. Jerline Pain, MD 02/24/2023 2:48 PM

## 2023-03-18 ENCOUNTER — Other Ambulatory Visit: Payer: Self-pay | Admitting: *Deleted

## 2023-03-18 MED ORDER — METFORMIN HCL ER 750 MG PO TB24: 750.0000 mg | ORAL_TABLET | Freq: Two times a day (BID) | ORAL | 0 refills | Status: DC

## 2023-03-20 NOTE — Progress Notes (Unsigned)
  Cardiology Office Note:    Date:  03/21/2023  ID:  [name], DOB 09/19/1946, MRN [id number] [name] Providers Cardiologist:  Tonny Bollman, MD      Patient Profile:   Coronary artery disease  Ant STEMI c/b OOH VF arrest s/p 3.5 x 38 mm DES to LAD LHC 11/11/15: pLAD 100, mLCx 50, mRCA 30, EF 25 Ischemic CM w recovery of LVF EF 25 at time of MI on LV gram TTE 11/14/15: EF 50-55%, dist ant and anterior septal HK  Diabetes mellitus  Hypertension  Hyperlipidemia      History of Present Illness:   [name] is a 77 y.o. male who returns for follow-up on CAD.  He was last seen by Dr. Excell Seltzer 03/15/2022.  He is here alone.  He is doing well without chest pain, shortness of breath, syncope, orthopnea, leg edema.  He remains active.  He walks at the park and rides a bike.  He also does yard work.  Review of Systems  Cardiovascular:  Negative for claudication.       Studies Reviewed:    EKG: NSR, HR 67, normal axis, nonspecific ST-T wave changes, QTc 420, no change since prior tracing   Risk Assessment/Calculations:             Physical Exam:   VS:  BP 126/60   Pulse 67   Ht 5\' 10"  (1.778 m)   Wt 176 lb 3.2 oz (79.9 kg)   SpO2 96%   BMI 25.28 kg/m    Wt Readings from Last 3 Encounters:  03/21/23 176 lb 3.2 oz (79.9 kg)  02/21/23 178 lb 12.8 oz (81.1 kg)  08/23/22 176 lb 9.6 oz (80.1 kg)    Constitutional:      Appearance: Healthy appearance. Not in distress.  Neck:     Vascular: No carotid bruit. JVD normal.  Pulmonary:     Breath sounds: Normal breath sounds. No wheezing. No rales.  Cardiovascular:     Normal rate. Regular rhythm. Normal S1. Normal S2.      Murmurs: There is no murmur.  Pulses:    Dorsalis pedis: 2+ bilaterally.    Posterior tibial: 2+ bilaterally. Edema:    Peripheral edema absent.  Abdominal:     Palpations: Abdomen is soft.       ASSESSMENT AND PLAN:   CAD (coronary artery disease) History of anterior STEMI  complicated by out of hospital arrest treated with DES to the LAD.  He had full recovery of ejection fraction.  He has been doing well without chest discomfort to suggest angina.  He remains fairly active.  Continue ASA 81 mg daily, atorvastatin 80 mg daily, metoprolol tartrate 12.5 mg twice daily.  Follow-up 1 year.  Hypertension associated with diabetes ([name]) Blood pressure well-controlled.  Continue HCTZ 25 mg daily, metoprolol tartrate 12.5 mg twice daily.  Hyperlipidemia due to type 2 diabetes mellitus LDL optimal in April 2023 at 8.  He is not fasting today.  Labs from [facility] reviewed.  His ALT was normal at 32 in March 2024.  Obtain direct LDL today.  Continue atorvastatin 80 mg daily.  Rheumatoid arthritis ([name]) Followed by rheumatology.        Dispo:  Return in about 1 year (around 03/20/2024) for Routine Follow Up, w/ Dr. Excell Seltzer, or Tereso Newcomer, PA-C.  Signed, Tereso Newcomer, PA-C

## 2023-03-21 ENCOUNTER — Encounter: Payer: Self-pay | Admitting: Physician Assistant

## 2023-03-21 ENCOUNTER — Ambulatory Visit: Payer: HMO | Attending: Physician Assistant | Admitting: Physician Assistant

## 2023-03-21 VITALS — BP 126/60 | HR 67 | Ht 70.0 in | Wt 176.2 lb

## 2023-03-21 DIAGNOSIS — E785 Hyperlipidemia, unspecified: Secondary | ICD-10-CM

## 2023-03-21 DIAGNOSIS — E1159 Type 2 diabetes mellitus with other circulatory complications: Secondary | ICD-10-CM | POA: Diagnosis not present

## 2023-03-21 DIAGNOSIS — I2510 Atherosclerotic heart disease of native coronary artery without angina pectoris: Secondary | ICD-10-CM

## 2023-03-21 DIAGNOSIS — E1169 Type 2 diabetes mellitus with other specified complication: Secondary | ICD-10-CM | POA: Diagnosis not present

## 2023-03-21 DIAGNOSIS — I152 Hypertension secondary to endocrine disorders: Secondary | ICD-10-CM | POA: Diagnosis not present

## 2023-03-21 DIAGNOSIS — M069 Rheumatoid arthritis, unspecified: Secondary | ICD-10-CM | POA: Diagnosis not present

## 2023-03-21 NOTE — Assessment & Plan Note (Signed)
Followed by rheumatology. 

## 2023-03-21 NOTE — Assessment & Plan Note (Addendum)
History of anterior STEMI complicated by out of hospital arrest treated with DES to the LAD.  He had full recovery of ejection fraction.  He has been doing well without chest discomfort to suggest angina.  He remains fairly active.  Continue ASA 81 mg daily, atorvastatin 80 mg daily, metoprolol tartrate 12.5 mg twice daily.  Follow-up 1 year.

## 2023-03-21 NOTE — Assessment & Plan Note (Signed)
LDL optimal in April 2023 at 60.  He is not fasting today.  Labs from [facility] reviewed.  His ALT was normal at 32 in March 2024.  Obtain direct LDL today.  Continue atorvastatin 80 mg daily.

## 2023-03-21 NOTE — Assessment & Plan Note (Signed)
Blood pressure well-controlled.  Continue HCTZ 25 mg daily, metoprolol tartrate 12.5 mg twice daily.

## 2023-03-21 NOTE — Patient Instructions (Signed)
Medication Instructions:  Your physician recommends that you continue on your current medications as directed. Please refer to the Current Medication list given to you today.  *If you need a refill on your cardiac medications before your next appointment, please call your pharmacy*   Lab Work: TODAY:  DIRECT LDL   If you have labs (blood work) drawn today and your tests are completely normal, you will receive your results only by: MyChart Message (if you have MyChart) OR A paper copy in the mail If you have any lab test that is abnormal or we need to change your treatment, we will call you to review the results.   Testing/Procedures: None ordered   Follow-Up: At [facility], you and your health needs are our priority.  As part of our continuing mission to provide you with exceptional heart care, we have created designated Provider Care Teams.  These Care Teams include your primary Cardiologist (physician) and Advanced Practice Providers (APPs -  Physician Assistants and Nurse Practitioners) who all work together to provide you with the care you need, when you need it.  We recommend signing up for the patient portal called "MyChart".  Sign up information is provided on this After Visit Summary.  MyChart is used to connect with patients for Virtual Visits (Telemedicine).  Patients are able to view lab/test results, encounter notes, upcoming appointments, etc.  Non-urgent messages can be sent to your provider as well.   To learn more about what you can do with MyChart, go to [email or web address].    Your next appointment:   12 month(s)  Provider:   Tonny Bollman, MD  or Tereso Newcomer, PA-C         Other Instructions

## 2023-03-22 LAB — LDL CHOLESTEROL, DIRECT: LDL Direct: 49 mg/dL (ref 0–99)

## 2023-04-21 ENCOUNTER — Ambulatory Visit (INDEPENDENT_AMBULATORY_CARE_PROVIDER_SITE_OTHER): Payer: PPO

## 2023-04-21 VITALS — Wt 176.0 lb

## 2023-04-21 DIAGNOSIS — Z0000 Encounter for general adult medical examination without abnormal findings: Secondary | ICD-10-CM

## 2023-04-21 NOTE — Patient Instructions (Signed)
[name] , Thank you for taking time to come for your Medicare Wellness Visit. I appreciate your ongoing commitment to your health goals. Please review the following plan we discussed and let me know if I can assist you in the future.   These are the goals we discussed:  Goals       "maintain my health like it currently is" (pt-stated)      Follow up with your providers as recommended. Take your medications as prescribed.  Incorporate activity into your daily schedule RN case manager will send client education article: Preventative health care for older adults.          Client understands the importance of follow-up with providers by attending scheduled visits      Primary care provider: 08/13/20 Cardiologist 12/17/19, 07/02/20 Follow up with your providers as recommended.       Client verbalizes knowledge of Heart Attack self management skills in 12 months      RN case manager will send client education article: Heart disease in diabetics and lowering your risk of heart disease. Low salt diet, and Heart healthy diet.   Call 911 for severe symptoms such as: chest pain, sweating, nausea/ vomiting, irregular heartbeat, rapid heart rate, shortness of breath, dizziness or fainting Take your medications as prescribed.  Follow up with your doctor for appointments and routine testing as recommended.  Follow a low salt, heart healthy diet       Client will verbalize knowledge of self management of Hypertension as evidences by BP reading of 140/90 or less; or as defined by provider      RN case manager will send client education article: High blood pressure in adults Plan to check your blood pressure regularly. Take prescription medications as recommended by your doctor Eat a low salt, heart healthy diet  Exercise Follow up with your doctor as advised.      HEMOGLOBIN A1C < 7      Discussed diabetes self management actions: Glucose monitoring per provider recommendation Check feet  daily Visit provider every 3-6 months as directed Hbg A1C level every 3-6 months. Eye Exam yearly Carbohydrate controlled meal planning Taking diabetes medication as prescribed by provider Physical activity       Maintain timely refills of diabetic medication as prescribed within the year .      Client reports maintaining timely refills of his medication. Discussed and reviewed medication with RN case manager Contact your RN case manager if you have difficulty obtaining your medications.       Obtain annual  Lipid Profile, LDL-C      Annual lipid profile: 07/03/19. Avoid saturated fats, trans-fats and eat more fiber Take your statin medication as prescribed.       Obtain Annual Foot Exam      Unable to determine most recent foot exam. Discussed with client importance of yearly foot check Diabetes can affect the nerves in your feet, causing decreased feeling or numbness.  Diabetes foot care - Check feet daily at home (look for skin color changes, cuts, sores or cracks in the skin, swelling of feet or ankles, ingrown or fungal toenails, corn or calluses). Report these findings to your doctor - Wash feet with soap and water, dry feet well especially between toes - Moisturize your feet but not between the toes - Always wear shoes that protect your whole feet.        Obtain annual screen for micro albuminuria (urine) , nephropathy (kidney problems)  Annual micro albuminuria screening:  07/27/17 Diabetes can affect your kidneys.  It is important for your doctor to check your urine at least once a year.  RN case manager will send client education article: Urine Albumin test      Obtain Hemoglobin A1C at least 2 times per year      Congratulations your Hgb A1c is below goal at 6.4% completed on 08/13/20 Continue a heart healthy, low salt, and low carbohydrate diet Take your medications as prescribed Check your blood sugars as directed by your doctor      Patient Stated      Stay  active      Patient Stated      None at this time       Patient Stated      Stay healthy       Visit Primary Care Provider or Endocrinologist at least 2 times per year       Primary care provider: 08/13/20 Cardiologist 12/17/19, 07/02/20 Follow up with your providers as recommended.         This is a list of the screening recommended for you and due dates:  Health Maintenance  Topic Date Due   Yearly kidney health urinalysis for diabetes  07/27/2018   Complete foot exam   08/19/2022   COVID-19 Vaccine (6 - 2023-24 season) 11/14/2022   Flu Shot  07/14/2023   Hemoglobin A1C  08/24/2023   Eye exam for diabetics  08/25/2023   DTaP/Tdap/Td vaccine (2 - Td or Tdap) 12/26/2023   Yearly kidney function blood test for diabetes  02/15/2024   Medicare Annual Wellness Visit  04/20/2024   Pneumonia Vaccine  Completed   Hepatitis C Screening: USPSTF Recommendation to screen - Ages 76-79 yo.  Completed   Zoster (Shingles) Vaccine  Completed   HPV Vaccine  Aged Out   Cologuard (Stool DNA test)  Discontinued    Advanced directives: Please bring a copy of your health care power of attorney and living will to the office at your convenience.  Conditions/risks identified: stay healthy   Next appointment: Follow up in one year for your annual wellness visit.   Preventive Care 76 Years and Older, Male  Preventive care refers to lifestyle choices and visits with your health care provider that can promote health and wellness. What does preventive care include? A yearly physical exam. This is also called an annual well check. Dental exams once or twice a year. Routine eye exams. Ask your health care provider how often you should have your eyes checked. Personal lifestyle choices, including: Daily care of your teeth and gums. Regular physical activity. Eating a healthy diet. Avoiding tobacco and drug use. Limiting alcohol use. Practicing safe sex. Taking low doses of aspirin every day. Taking  vitamin and mineral supplements as recommended by your health care provider. What happens during an annual well check? The services and screenings done by your health care provider during your annual well check will depend on your age, overall health, lifestyle risk factors, and family history of disease. Counseling  Your health care provider may ask you questions about your: Alcohol use. Tobacco use. Drug use. Emotional well-being. Home and relationship well-being. Sexual activity. Eating habits. History of falls. Memory and ability to understand (cognition). Work and work Astronomer. Screening  You may have the following tests or measurements: Height, weight, and BMI. Blood pressure. Lipid and cholesterol levels. These may be checked every 5 years, or more frequently if you are over 50  years old. Skin check. Lung cancer screening. You may have this screening every year starting at age 4 if you have a 30-pack-year history of smoking and currently smoke or have quit within the past 15 years. Fecal occult blood test (FOBT) of the stool. You may have this test every year starting at age 4. Flexible sigmoidoscopy or colonoscopy. You may have a sigmoidoscopy every 5 years or a colonoscopy every 10 years starting at age 24. Prostate cancer screening. Recommendations will vary depending on your family history and other risks. Hepatitis C blood test. Hepatitis B blood test. Sexually transmitted disease (STD) testing. Diabetes screening. This is done by checking your blood sugar (glucose) after you have not eaten for a while (fasting). You may have this done every 1-3 years. Abdominal aortic aneurysm (AAA) screening. You may need this if you are a current or former smoker. Osteoporosis. You may be screened starting at age 2 if you are at high risk. Talk with your health care provider about your test results, treatment options, and if necessary, the need for more tests. Vaccines  Your  health care provider may recommend certain vaccines, such as: Influenza vaccine. This is recommended every year. Tetanus, diphtheria, and acellular pertussis (Tdap, Td) vaccine. You may need a Td booster every 10 years. Zoster vaccine. You may need this after age 103. Pneumococcal 13-valent conjugate (PCV13) vaccine. One dose is recommended after age 15. Pneumococcal polysaccharide (PPSV23) vaccine. One dose is recommended after age 24. Talk to your health care provider about which screenings and vaccines you need and how often you need them. This information is not intended to replace advice given to you by your health care provider. Make sure you discuss any questions you have with your health care provider. Document Released: 12/26/2015 Document Revised: 08/18/2016 Document Reviewed: 09/30/2015 Elsevier Interactive Patient Education  2017 [employer].  Fall Prevention in the Home Falls can cause injuries. They can happen to people of all ages. There are many things you can do to make your home safe and to help prevent falls. What can I do on the outside of my home? Regularly fix the edges of walkways and driveways and fix any cracks. Remove anything that might make you trip as you walk through a door, such as a raised step or threshold. Trim any bushes or trees on the path to your home. Use bright outdoor lighting. Clear any walking paths of anything that might make someone trip, such as rocks or tools. Regularly check to see if handrails are loose or broken. Make sure that both sides of any steps have handrails. Any raised decks and porches should have guardrails on the edges. Have any leaves, snow, or ice cleared regularly. Use sand or salt on walking paths during winter. Clean up any spills in your garage right away. This includes oil or grease spills. What can I do in the bathroom? Use night lights. Install grab bars by the toilet and in the tub and shower. Do not use towel bars as  grab bars. Use non-skid mats or decals in the tub or shower. If you need to sit down in the shower, use a plastic, non-slip stool. Keep the floor dry. Clean up any water that spills on the floor as soon as it happens. Remove soap buildup in the tub or shower regularly. Attach bath mats securely with double-sided non-slip rug tape. Do not have throw rugs and other things on the floor that can make you trip. What can I  do in the bedroom? Use night lights. Make sure that you have a light by your bed that is easy to reach. Do not use any sheets or blankets that are too big for your bed. They should not hang down onto the floor. Have a firm chair that has side arms. You can use this for support while you get dressed. Do not have throw rugs and other things on the floor that can make you trip. What can I do in the kitchen? Clean up any spills right away. Avoid walking on wet floors. Keep items that you use a lot in easy-to-reach places. If you need to reach something above you, use a strong step stool that has a grab bar. Keep electrical cords out of the way. Do not use floor polish or wax that makes floors slippery. If you must use wax, use non-skid floor wax. Do not have throw rugs and other things on the floor that can make you trip. What can I do with my stairs? Do not leave any items on the stairs. Make sure that there are handrails on both sides of the stairs and use them. Fix handrails that are broken or loose. Make sure that handrails are as long as the stairways. Check any carpeting to make sure that it is firmly attached to the stairs. Fix any carpet that is loose or worn. Avoid having throw rugs at the top or bottom of the stairs. If you do have throw rugs, attach them to the floor with carpet tape. Make sure that you have a light switch at the top of the stairs and the bottom of the stairs. If you do not have them, ask someone to add them for you. What else can I do to help prevent  falls? Wear shoes that: Do not have high heels. Have rubber bottoms. Are comfortable and fit you well. Are closed at the toe. Do not wear sandals. If you use a stepladder: Make sure that it is fully opened. Do not climb a closed stepladder. Make sure that both sides of the stepladder are locked into place. Ask someone to hold it for you, if possible. Clearly mark and make sure that you can see: Any grab bars or handrails. First and last steps. Where the edge of each step is. Use tools that help you move around (mobility aids) if they are needed. These include: Canes. Walkers. Scooters. Crutches. Turn on the lights when you go into a dark area. Replace any light bulbs as soon as they burn out. Set up your furniture so you have a clear path. Avoid moving your furniture around. If any of your floors are uneven, fix them. If there are any pets around you, be aware of where they are. Review your medicines with your doctor. Some medicines can make you feel dizzy. This can increase your chance of falling. Ask your doctor what other things that you can do to help prevent falls. This information is not intended to replace advice given to you by your health care provider. Make sure you discuss any questions you have with your health care provider. Document Released: 09/25/2009 Document Revised: 05/06/2016 Document Reviewed: 01/03/2015 Elsevier Interactive Patient Education  2017 [employer].

## 2023-04-21 NOTE — Progress Notes (Signed)
I connected with  [name] on 04/21/23 by a audio enabled telemedicine application and verified that I am speaking with the correct person using two identifiers.  Patient Location: Home  Provider Location: Office/Clinic  I discussed the limitations of evaluation and management by telemedicine. The patient expressed understanding and agreed to proceed.   Patient Medicare AWV questionnaire was completed by the patient on 04/19/23; I have confirmed that all information answered by patient is correct and no changes since this date.     Subjective:   [name] is a 77 y.o. male who presents for Medicare Annual/Subsequent preventive examination.  Review of Systems     Cardiac Risk Factors include: advanced age (>35men, >48 women);diabetes mellitus;male gender;hypertension;dyslipidemia     Objective:    Today's Vitals   04/21/23 1004  Weight: 176 lb (79.8 kg)   Body mass index is 25.25 kg/m.     04/21/2023   10:09 AM 04/08/2022    9:46 AM 12/01/2020    1:54 PM 08/29/2020   10:21 AM 08/29/2017    7:19 AM 08/24/2017   10:07 AM 11/12/2015    2:00 AM  Advanced Directives  Does Patient Have a Medical Advance Directive? Yes Yes Yes Yes Yes Yes No  Type of Estate agent of [place];Living will Healthcare Power of [employer] of [place];Living will Healthcare Power of [place];Living will Healthcare Power of [place];Living will Healthcare Power of [place];Living will   Does patient want to make changes to medical advance directive?   No - Patient declined  No - Patient declined    Copy of Healthcare Power of Attorney in Chart? No - copy requested No - copy requested  No - copy requested Yes Yes     Current Medications (verified) Outpatient Encounter Medications as of 04/21/2023  Medication Sig   aspirin EC 81 MG tablet Take 81 mg by mouth daily.   atorvastatin (LIPITOR) 80 MG tablet TAKE 1 TABLET BY MOUTH  DAILY AT 6PM.   cetirizine  (ZYRTEC) 10 MG tablet Take 10 mg by mouth daily.   Cholecalciferol (VITAMIN D-3) 1000 UNITS CAPS Take 5,000 Units by mouth daily.    famotidine (PEPCID) 20 MG tablet Take 1 tablet (20 mg total) by mouth 2 (two) times daily.   finasteride (PROSCAR) 5 MG tablet Take 5 mg by mouth daily.   fluticasone (FLONASE) 50 MCG/ACT nasal spray USE 2 SPRAYS IN EACH  NOSTRIL DAILY   folic acid (FOLVITE) 1 MG tablet Take 1 mg by mouth daily.   hydrochlorothiazide (HYDRODIURIL) 25 MG tablet 1 tablet in the morning   meloxicam (MOBIC) 7.5 MG tablet Take 7.5 mg by mouth daily.   metFORMIN (GLUCOPHAGE-XR) 750 MG 24 hr tablet Take 1 tablet (750 mg total) by mouth 2 (two) times daily.   methotrexate (RHEUMATREX) 2.5 MG tablet Take 15 mg by mouth once a week. Caution:Chemotherapy. Protect from light.   metoprolol tartrate (LOPRESSOR) 25 MG tablet Take 0.5 tablets (12.5 mg total) by mouth 2 (two) times daily.   nitroGLYCERIN (NITROSTAT) 0.4 MG SL tablet Place 1 tablet under the tongue as needed. Every 5 minutes for chest pain   Probiotic Product (PRO-BIOTIC BLEND PO) Take 1 tablet by mouth daily.   tamsulosin (FLOMAX) 0.4 MG CAPS capsule Take 0.4 mg by mouth as needed.   traMADol (ULTRAM) 50 MG tablet Take 50 mg by mouth every 6 (six) hours as needed for moderate pain.   traZODone (DESYREL) 50 MG tablet Take 1 tablet (  50 mg total) by mouth at bedtime.   No facility-administered encounter medications on file as of 04/21/2023.    Allergies (verified) Penicillins, Contrast media [iodinated contrast media], Sulfamethoxazole, and Ibuprofen   History: Past Medical History:  Diagnosis Date   Allergic rhinitis    Arthritis    CAD (coronary artery disease)    a. 11/11/15 - OOH VF arrest 2/2 ant STEMI >> CPR//AED shock x 2 >> ROSC >> LHC: pLAD 100 (Promus DES), mLCx 50, mRCA 30, EF 25% [managed with hypothermia protocol]   Fractured rib    GERD (gastroesophageal reflux disease)    History of Clostridium difficile  colitis 10/2015   post MI >> required IV Flagyl and IV Vanc >> DC on po Vanc   History of kidney stones    History of lithotripsy    HLD (hyperlipidemia)    Hypertension    Impaired glucose tolerance    Insomnia    Ischemic cardiomyopathy    a. EF at [facility] 11/11/15 25%;  b. Echo 11/14/15: EF 50-55%, dist ant and anterior septal HK   Nephrolithiasis 1968   Pelvic fracture ([name]) 09/2015   Pre-diabetes    Rheumatoid arthritis(714.0) 1998   ST elevation (STEMI) myocardial infarction involving left anterior descending coronary artery ([name]) 11/11/15   100% LAD, Cardiogenic Shock. -- EF ~20-25%   Past Surgical History:  Procedure Laterality Date   CARDIAC CATHETERIZATION N/A 11/11/2015   Procedure: Left Heart Cath and Cors/Grafts Angiography;  Surgeon: Tonny Bollman, MD;  Location: [facility] INVASIVE CV LAB;  Service: Cardiovascular;  Laterality: N/A;   CARDIAC CATHETERIZATION N/A 11/11/2015   Procedure: Coronary Stent Intervention;  Surgeon: Tonny Bollman, MD;  Location: [facility] INVASIVE CV LAB;  Service: Cardiovascular;  Laterality: N/A;  prox lad 3.5x38 promus   CORONARY ANGIOPLASTY WITH STENT PLACEMENT  2016   EXTRACORPOREAL SHOCK WAVE LITHOTRIPSY Left 08/29/2017   Procedure: LEFT EXTRACORPOREAL SHOCK WAVE LITHOTRIPSY (ESWL);  Surgeon: Heloise Purpura, MD;  Location: [facility] ORS;  Service: Urology;  Laterality: Left;   status post multiple lithotripy and urological surgery for reccurent calcium oxalate stones     Family History  Problem Relation Age of Onset   Cancer Mother        pancreatic ; pulmonary embolism    Diabetes Father    Social History   Socioeconomic History   Marital status: Married    Spouse name: Not on file   Number of children: Not on file   Years of education: Not on file   Highest education level: Not on file  Occupational History   Occupation: retired  Tobacco Use   Smoking status: Former   Smokeless tobacco: Never   Tobacco comments:    quit 30 yrs ago  Vaping Use    Vaping Use: Never used  Substance and Sexual Activity   Alcohol use: No   Drug use: No   Sexual activity: Not on file  Other Topics Concern   Not on file  Social History Narrative   Not on file   Social Determinants of Health   Financial Resource Strain: Low Risk  (04/19/2023)   Overall Financial Resource Strain (CARDIA)    Difficulty of Paying Living Expenses: Not hard at all  Food Insecurity: No Food Insecurity (04/19/2023)   Hunger Vital Sign    Worried About Running Out of Food in the Last Year: Never true    Ran Out of Food in the Last Year: Never true  Transportation Needs: No Transportation Needs (04/19/2023)  PRAPARE - Administrator, Civil Service (Medical): No    Lack of Transportation (Non-Medical): No  Physical Activity: Sufficiently Active (04/19/2023)   Exercise Vital Sign    Days of Exercise per Week: 4 days    Minutes of Exercise per Session: 120 min  Stress: No Stress Concern Present (04/19/2023)   [employer] of Occupational Health - Occupational Stress Questionnaire    Feeling of Stress : Not at all  Social Connections: Socially Isolated (04/19/2023)   Social Connection and Isolation Panel [NHANES]    Frequency of Communication with Friends and Family: Once a week    Frequency of Social Gatherings with Friends and Family: Once a week    Attends Religious Services: Never    Database administrator or Organizations: No    Attends Engineer, structural: Never    Marital Status: Married    Tobacco Counseling Counseling given: Not Answered Tobacco comments: quit 30 yrs ago   Clinical Intake:  Pre-visit preparation completed: Yes  Pain : No/denies pain     BMI - recorded: 25.25 Nutritional Status: BMI 25 -29 Overweight Nutritional Risks: None Diabetes: Yes CBG done?: No Did pt. bring in CBG monitor from home?: No  How often do you need to have someone help you when you read instructions, pamphlets, or other written materials from  your doctor or pharmacy?: 1 - Never  Diabetic?Nutrition Risk Assessment:  Has the patient had any N/V/D within the last 2 months?  No  Does the patient have any non-healing wounds?  No  Has the patient had any unintentional weight loss or weight gain?  No   Diabetes:  Is the patient diabetic?  Yes  If diabetic, was a CBG obtained today?  No  Did the patient bring in their glucometer from home?  No  How often do you monitor your CBG's? N/a.   Financial Strains and Diabetes Management:  Are you having any financial strains with the device, your supplies or your medication? No .  Does the patient want to be seen by Chronic Care Management for management of their diabetes?  No  Would the patient like to be referred to a Nutritionist or for Diabetic Management?  No   Diabetic Exams:  Diabetic Eye Exam: Completed 08/24/22 Diabetic Foot Exam: Overdue, Pt has been advised about the importance in completing this exam. Pt is scheduled for diabetic foot exam on next appt.   Interpreter Needed?: No  Information entered by :: Lanier Ensign, LPN   Activities of Daily Living    04/19/2023   12:10 PM  In your present state of health, do you have any difficulty performing the following activities:  Hearing? 0  Vision? 0  Difficulty concentrating or making decisions? 0  Walking or climbing stairs? 0  Dressing or bathing? 0  Doing errands, shopping? 0  Preparing Food and eating ? N  Using the Toilet? N  In the past six months, have you accidently leaked urine? N  Do you have problems with loss of bowel control? N  Managing your Medications? N  Managing your Finances? N  Housekeeping or managing your Housekeeping? N    Patient Care Team: Ardith Dark, MD as PCP - General (Family Medicine) Tonny Bollman, MD as PCP - Cardiology (Cardiology) Shea Evans, [facility])  Indicate any recent Medical Services you may have received from other than [facility] providers in the past year (date  may be approximate).     Assessment:  This is a routine wellness examination for Aariv.  Hearing/Vision screen Hearing Screening - Comments:: Pt denies any hearing issues  Vision Screening - Comments:: Pt follows up with Dr Shea Evans for annual eye exams   Dietary issues and exercise activities discussed: Current Exercise Habits: Home exercise routine, Type of exercise: walking;stretching;Other - see comments, Time (Minutes): > 60, Frequency (Times/Week): 4, Weekly Exercise (Minutes/Week): 0   Goals Addressed             This Visit's Progress    Patient Stated       Stay healthy        Depression Screen    04/21/2023   10:09 AM 02/21/2023   10:03 AM 08/23/2022   10:16 AM 08/23/2022   10:15 AM 04/08/2022    9:45 AM 02/18/2022   10:32 AM 08/19/2021    9:57 AM  PHQ 2/9 Scores  PHQ - 2 Score 0 0 0 0 0 0 0    Fall Risk    04/19/2023   12:10 PM 02/21/2023   10:03 AM 08/23/2022   10:16 AM 08/23/2022   10:15 AM 04/08/2022    9:47 AM  Fall Risk   Falls in the past year? 0 0 0 0 0  Number falls in past yr:  0 0 0 0  Injury with Fall? 0  0 0 0  Risk for fall due to : Impaired vision No Fall Risks No Fall Risks No Fall Risks Impaired vision  Follow up Falls prevention discussed    Falls prevention discussed    FALL RISK PREVENTION PERTAINING TO THE HOME:  Any stairs in or around the home? Yes  If so, are there any without handrails? No  Home free of loose throw rugs in walkways, pet beds, electrical cords, etc? Yes  Adequate lighting in your home to reduce risk of falls? Yes   ASSISTIVE DEVICES UTILIZED TO PREVENT FALLS:  Life alert? No  Use of a cane, walker or w/c? No  Grab bars in the bathroom? No  Shower chair or bench in shower? No  Elevated toilet seat or a handicapped toilet? No   TIMED UP AND GO:  Was the test performed? No .   Cognitive Function:        04/21/2023   10:11 AM 04/08/2022    9:47 AM 08/29/2020   10:26 AM  6CIT Screen  What Year? 0 points 0  points 0 points  What month? 0 points 0 points 0 points  What time? 0 points 0 points   Count back from 20 0 points 0 points 0 points  Months in reverse 0 points 4 points 0 points  Repeat phrase 0 points 2 points 2 points  Total Score 0 points 6 points     Immunizations Immunization History  Administered Date(s) Administered   Fluad Quad(high Dose 65+) 08/27/2019, 08/19/2021, 08/23/2022   Influenza Split 09/26/2013   Influenza Whole 09/23/2009, 09/02/2010   Influenza, High Dose Seasonal PF 10/03/2015, 09/09/2017, 09/18/2018   Influenza-Unspecified 10/13/2014, 09/23/2016, 09/09/2017, 09/12/2018, 10/01/2020   PFIZER(Purple Top)SARS-COV-2 Vaccination 01/17/2020, 02/12/2020, 08/06/2020   Pfizer Covid-19 Vaccine Bivalent Booster 55yrs & up 11/19/2021, 09/19/2022   Pneumococcal Conjugate-13 10/03/2015, 06/12/2021   Pneumococcal Polysaccharide-23 12/25/2013   Respiratory Syncytial Virus Vaccine,Recomb Aduvanted(Arexvy) 09/28/2022   Tdap 12/25/2013   Zoster Recombinat (Shingrix) 03/03/2021, 06/12/2021    TDAP status: Up to date  Flu Vaccine status: Up to date  Pneumococcal vaccine status: Up to date  Covid-19 vaccine status: Completed vaccines  Qualifies for Shingles Vaccine? Yes   Zostavax completed Yes   Shingrix Completed?: Yes  Screening Tests Health Maintenance  Topic Date Due   Diabetic kidney evaluation - Urine ACR  07/27/2018   FOOT EXAM  08/19/2022   COVID-19 Vaccine (6 - 2023-24 season) 11/14/2022   INFLUENZA VACCINE  07/14/2023   HEMOGLOBIN A1C  08/24/2023   OPHTHALMOLOGY EXAM  08/25/2023   DTaP/Tdap/Td (2 - Td or Tdap) 12/26/2023   Diabetic kidney evaluation - eGFR measurement  02/15/2024   Medicare Annual Wellness (AWV)  04/20/2024   Pneumonia Vaccine 81+ Years old  Completed   Hepatitis C Screening  Completed   Zoster Vaccines- Shingrix  Completed   HPV VACCINES  Aged Out   Fecal DNA (Cologuard)  Discontinued    Health Maintenance  Health Maintenance  Due  Topic Date Due   Diabetic kidney evaluation - Urine ACR  07/27/2018   FOOT EXAM  08/19/2022   COVID-19 Vaccine (6 - 2023-24 season) 11/14/2022    Colorectal cancer screening: Type of screening: Cologuard. Completed 08/30/20. Repeat every 3 years    Additional Screening:  Hepatitis C Screening:  Completed 08/09/16  Vision Screening: Recommended annual ophthalmology exams for early detection of glaucoma and other disorders of the eye. Is the patient up to date with their annual eye exam?  Yes  Who is the provider or what is the name of the office in which the patient attends annual eye exams? Dr Shea Evans  If pt is not established with a provider, would they like to be referred to a provider to establish care? No .   Dental Screening: Recommended annual dental exams for proper oral hygiene  Community Resource Referral / Chronic Care Management: CRR required this visit?  No   CCM required this visit?  No      Plan:     I have personally reviewed and noted the following in the patient's chart:   Medical and social history Use of alcohol, tobacco or illicit drugs  Current medications and supplements including opioid prescriptions. Patient is currently taking opioid prescriptions. Information provided to patient regarding non-opioid alternatives. Patient advised to discuss non-opioid treatment plan with their provider. Functional ability and status Nutritional status Physical activity Advanced directives List of other physicians Hospitalizations, surgeries, and ER visits in previous 12 months Vitals Screenings to include cognitive, depression, and falls Referrals and appointments  In addition, I have reviewed and discussed with patient certain preventive protocols, quality metrics, and best practice recommendations. A written personalized care plan for preventive services as well as general preventive health recommendations were provided to patient.     Marzella Schlein,  LPN   07/14/9561   Nurse Notes: none

## 2023-04-26 ENCOUNTER — Other Ambulatory Visit: Payer: Self-pay

## 2023-04-26 DIAGNOSIS — I10 Essential (primary) hypertension: Secondary | ICD-10-CM

## 2023-04-26 MED ORDER — METOPROLOL TARTRATE 25 MG PO TABS
12.5000 mg | ORAL_TABLET | Freq: Two times a day (BID) | ORAL | 3 refills | Status: DC
Start: 2023-04-26 — End: 2024-07-23

## 2023-05-17 DIAGNOSIS — M0589 Other rheumatoid arthritis with rheumatoid factor of multiple sites: Secondary | ICD-10-CM | POA: Diagnosis not present

## 2023-05-18 LAB — LAB REPORT - SCANNED: EGFR: 65

## 2023-05-20 ENCOUNTER — Other Ambulatory Visit: Payer: Self-pay

## 2023-05-20 MED ORDER — ATORVASTATIN CALCIUM 80 MG PO TABS: ORAL_TABLET | ORAL | 2 refills | Status: DC

## 2023-05-23 ENCOUNTER — Other Ambulatory Visit: Payer: Self-pay | Admitting: *Deleted

## 2023-05-23 NOTE — Telephone Encounter (Signed)
Pharmacy [facility] requesting Rx Trazodone Refill

## 2023-05-24 MED ORDER — TRAZODONE HCL 50 MG PO TABS: 50.0000 mg | ORAL_TABLET | Freq: Every day | ORAL | 0 refills | Status: DC

## 2023-06-27 ENCOUNTER — Other Ambulatory Visit: Payer: Self-pay | Admitting: *Deleted

## 2023-06-27 MED ORDER — METFORMIN HCL ER 750 MG PO TB24: 750.0000 mg | ORAL_TABLET | Freq: Two times a day (BID) | ORAL | 0 refills | Status: DC

## 2023-07-28 ENCOUNTER — Encounter (INDEPENDENT_AMBULATORY_CARE_PROVIDER_SITE_OTHER): Payer: Self-pay

## 2023-08-09 ENCOUNTER — Encounter: Payer: Self-pay | Admitting: Pharmacist

## 2023-08-09 NOTE — Progress Notes (Signed)
Pharmacy Quality Measure Review  This patient is appearing on a report for being at risk of failing the adherence measure for cholesterol (statin) medications this calendar year.   Medication: atorvastatin 80mg   Last fill date: 03/13/2023 for 90 day supply  New Rx was sent to [facility] by Dr Excell Seltzer. Per Epic records was filled 06/22/2023 for 90 DS.  Also checked Dr Tiajuana Amass but fills / refills for atorvastatin is not showing in their records Verified with [facility] mail order that atorvastatin 80mg  was filled and mailed to patient 06/22/2023  Insurance report was not up to date. No action needed at this time.

## 2023-08-23 ENCOUNTER — Ambulatory Visit: Payer: HMO | Admitting: Family Medicine

## 2023-08-23 DIAGNOSIS — R7989 Other specified abnormal findings of blood chemistry: Secondary | ICD-10-CM | POA: Diagnosis not present

## 2023-08-23 DIAGNOSIS — M542 Cervicalgia: Secondary | ICD-10-CM | POA: Diagnosis not present

## 2023-08-23 DIAGNOSIS — E663 Overweight: Secondary | ICD-10-CM | POA: Diagnosis not present

## 2023-08-23 DIAGNOSIS — Z6825 Body mass index (BMI) 25.0-25.9, adult: Secondary | ICD-10-CM | POA: Diagnosis not present

## 2023-08-23 DIAGNOSIS — M0589 Other rheumatoid arthritis with rheumatoid factor of multiple sites: Secondary | ICD-10-CM | POA: Diagnosis not present

## 2023-08-23 DIAGNOSIS — M1991 Primary osteoarthritis, unspecified site: Secondary | ICD-10-CM | POA: Diagnosis not present

## 2023-08-23 DIAGNOSIS — M7989 Other specified soft tissue disorders: Secondary | ICD-10-CM | POA: Diagnosis not present

## 2023-08-23 DIAGNOSIS — M25511 Pain in right shoulder: Secondary | ICD-10-CM | POA: Diagnosis not present

## 2023-08-23 LAB — HEPATIC FUNCTION PANEL
ALT: 27 U/L (ref 10–40)
AST: 18 (ref 14–40)
Bilirubin, Total: 0.5

## 2023-08-23 LAB — CBC AND DIFFERENTIAL
HCT: 47 (ref 41–53)
Hemoglobin: 16.1 (ref 13.5–17.5)
Neutrophils Absolute: 5
Platelets: 226 10*3/uL (ref 150–400)
WBC: 6.6

## 2023-08-23 LAB — COMPREHENSIVE METABOLIC PANEL
Albumin: 4.5 (ref 3.5–5.0)
Calcium: 10.3 (ref 8.7–10.7)
Globulin: 4.5
eGFR: 69

## 2023-08-23 LAB — BASIC METABOLIC PANEL
BUN: 20 (ref 4–21)
CO2: 24 — AB (ref 13–22)
Chloride: 100 (ref 99–108)
Creatinine: 1.1 (ref 0.6–1.3)
Glucose: 180
Potassium: 4.2 mEq/L (ref 3.5–5.1)
Sodium: 142 (ref 137–147)

## 2023-08-23 LAB — CBC: RBC: 5 (ref 3.87–5.11)

## 2023-08-25 ENCOUNTER — Encounter: Payer: Self-pay | Admitting: Family Medicine

## 2023-08-25 ENCOUNTER — Ambulatory Visit (INDEPENDENT_AMBULATORY_CARE_PROVIDER_SITE_OTHER): Payer: PPO | Admitting: Family Medicine

## 2023-08-25 VITALS — BP 124/70 | HR 76 | Temp 97.8°F | Ht 70.0 in | Wt 172.4 lb

## 2023-08-25 DIAGNOSIS — Z23 Encounter for immunization: Secondary | ICD-10-CM

## 2023-08-25 DIAGNOSIS — E1151 Type 2 diabetes mellitus with diabetic peripheral angiopathy without gangrene: Secondary | ICD-10-CM

## 2023-08-25 DIAGNOSIS — E1159 Type 2 diabetes mellitus with other circulatory complications: Secondary | ICD-10-CM

## 2023-08-25 DIAGNOSIS — I152 Hypertension secondary to endocrine disorders: Secondary | ICD-10-CM

## 2023-08-25 DIAGNOSIS — Z7984 Long term (current) use of oral hypoglycemic drugs: Secondary | ICD-10-CM

## 2023-08-25 LAB — POCT GLYCOSYLATED HEMOGLOBIN (HGB A1C): Hemoglobin A1C: 7.3 % — AB (ref 4.0–5.6)

## 2023-08-25 NOTE — Progress Notes (Signed)
   [name] is a 77 y.o. male who presents today for an office visit.  Assessment/Plan:  Chronic Problems Addressed Today: DM (diabetes mellitus), type 2 with peripheral vascular complications ([name]) A1c improved to 7.3.  He is working harder on diet and exercise.  Congratulated patient on lifestyle changes.  He is also down about 4 pounds since our last visit.  He will continue metformin 750 mg twice daily.  Recheck A1c in 6 months at [facility].  Hypertension associated with diabetes ([name]) Blood pressure goal on HCTZ 25 mg daily and metoprolol tartrate 12.5 mg twice daily.  Flu vaccine given today.     Subjective:  HPI:  See A/P for status of chronic conditions.  Patient is here for follow-up.  Had physical 6 months ago.  A1c at that time was 7.7.  Declined making medication changes at that point and we continued him on metformin 750 mg twice daily.   A few months ago he did have labs done with rheumatology that showed sugar in the 300s. He made a big adjustment to his activity levels. Has been riding bike more, doing a lot of yard work. He has been cutting down on sugar intake.        Objective:  Physical Exam: BP 124/70   Pulse 76   Temp 97.8 F (36.6 C) (Temporal)   Ht 5\' 10"  (1.778 m)   Wt 172 lb 6.4 oz (78.2 kg)   SpO2 98%   BMI 24.74 kg/m   Wt Readings from Last 3 Encounters:  08/25/23 172 lb 6.4 oz (78.2 kg)  04/21/23 176 lb (79.8 kg)  03/21/23 176 lb 3.2 oz (79.9 kg)    Gen: No acute distress, resting comfortably CV: Regular rate and rhythm with no murmurs appreciated Pulm: Normal work of breathing, clear to auscultation bilaterally with no crackles, wheezes, or rhonchi Neuro: Grossly normal, moves all extremities Psych: Normal affect and thought content      Romina Divirgilio M. Jimmey Ralph, MD 08/25/2023 10:58 AM

## 2023-08-25 NOTE — Assessment & Plan Note (Signed)
Blood pressure goal on HCTZ 25 mg daily and metoprolol tartrate 12.5 mg twice daily.

## 2023-08-25 NOTE — Patient Instructions (Signed)
It was very nice to see you today!  Your A1c is improving.  Please continue to work on your diet and exercise.  No medication changes today.  We will see you back in 6 months for your annual physical.  Please come back to see [place] sooner if needed.  Return in about 6 months (around 02/22/2024).   Take care, Dr Jimmey Ralph  PLEASE NOTE:  If you had any lab tests, please let us know if you have not heard back within a few days. You may see your results on mychart before we have a chance to review them but we will give you a call once they are reviewed by [place].   If we ordered any referrals today, please let us know if you have not heard from their office within the next week.   If you had any urgent prescriptions sent in today, please check with the pharmacy within an hour of our visit to make sure the prescription was transmitted appropriately.   Please try these tips to maintain a healthy lifestyle:  Eat at least 3 REAL meals and 1-2 snacks per day.  Aim for no more than 5 hours between eating.  If you eat breakfast, please do so within one hour of getting up.   Each meal should contain half fruits/vegetables, one quarter protein, and one quarter carbs (no bigger than a computer mouse)  Cut down on sweet beverages. This includes juice, soda, and sweet tea.   Drink at least 1 glass of water with each meal and aim for at least 8 glasses per day  Exercise at least 150 minutes every week.

## 2023-08-25 NOTE — Assessment & Plan Note (Signed)
A1c improved to 7.3.  He is working harder on diet and exercise.  Congratulated patient on lifestyle changes.  He is also down about 4 pounds since our last visit.  He will continue metformin 750 mg twice daily.  Recheck A1c in 6 months at [facility].

## 2023-08-26 DIAGNOSIS — E119 Type 2 diabetes mellitus without complications: Secondary | ICD-10-CM | POA: Diagnosis not present

## 2023-08-26 LAB — HM DIABETES EYE EXAM

## 2023-08-30 ENCOUNTER — Encounter: Payer: Self-pay | Admitting: Family Medicine

## 2023-09-01 ENCOUNTER — Encounter: Payer: Self-pay | Admitting: Family Medicine

## 2023-09-13 ENCOUNTER — Encounter: Payer: Self-pay | Admitting: Family Medicine

## 2023-09-13 ENCOUNTER — Other Ambulatory Visit: Payer: Self-pay | Admitting: *Deleted

## 2023-09-13 MED ORDER — TRAZODONE HCL 50 MG PO TABS: 50.0000 mg | ORAL_TABLET | Freq: Every day | ORAL | 0 refills | Status: DC

## 2023-09-13 NOTE — Telephone Encounter (Signed)
Patient notified Rx send to pharmacy  

## 2023-09-28 ENCOUNTER — Other Ambulatory Visit: Payer: Self-pay | Admitting: *Deleted

## 2023-09-28 MED ORDER — METFORMIN HCL ER 750 MG PO TB24: 750.0000 mg | ORAL_TABLET | Freq: Two times a day (BID) | ORAL | 0 refills | Status: DC

## 2023-10-04 DIAGNOSIS — M67431 Ganglion, right wrist: Secondary | ICD-10-CM | POA: Diagnosis not present

## 2023-10-04 DIAGNOSIS — M25531 Pain in right wrist: Secondary | ICD-10-CM | POA: Diagnosis not present

## 2023-10-12 DIAGNOSIS — G8918 Other acute postprocedural pain: Secondary | ICD-10-CM | POA: Diagnosis not present

## 2023-10-12 DIAGNOSIS — M67431 Ganglion, right wrist: Secondary | ICD-10-CM | POA: Diagnosis not present

## 2023-10-12 DIAGNOSIS — M67441 Ganglion, right hand: Secondary | ICD-10-CM | POA: Diagnosis not present

## 2023-10-12 DIAGNOSIS — R2231 Localized swelling, mass and lump, right upper limb: Secondary | ICD-10-CM | POA: Diagnosis not present

## 2023-11-05 ENCOUNTER — Other Ambulatory Visit: Payer: Self-pay | Admitting: Family Medicine

## 2023-11-07 ENCOUNTER — Encounter: Payer: Self-pay | Admitting: Family Medicine

## 2023-11-07 MED ORDER — METFORMIN HCL ER 750 MG PO TB24: 750.0000 mg | ORAL_TABLET | Freq: Two times a day (BID) | ORAL | 0 refills | Status: DC

## 2023-11-09 NOTE — Telephone Encounter (Signed)
Pt states that he requested 1 week ago a refill for his Metformin medication, pt stated that he had to get involved after not hearing back from our office and whoever responded to his original request it was not clarified with him if his medication was ever refilled. Pt's wife checked through the pharmacy's website last night and noticed that the medication was called in. I apologized to the pt for the delay.

## 2023-11-23 DIAGNOSIS — M0589 Other rheumatoid arthritis with rheumatoid factor of multiple sites: Secondary | ICD-10-CM | POA: Diagnosis not present

## 2023-11-24 ENCOUNTER — Encounter: Payer: Self-pay | Admitting: Internal Medicine

## 2023-11-24 LAB — LAB REPORT - SCANNED: EGFR: 63

## 2023-12-06 ENCOUNTER — Other Ambulatory Visit: Payer: Self-pay | Admitting: *Deleted

## 2023-12-06 MED ORDER — TRAZODONE HCL 50 MG PO TABS: 50.0000 mg | ORAL_TABLET | Freq: Every day | ORAL | 0 refills | Status: DC

## 2024-01-12 ENCOUNTER — Encounter: Payer: Self-pay | Admitting: Cardiovascular Disease

## 2024-01-12 ENCOUNTER — Encounter: Payer: Self-pay | Admitting: Family Medicine

## 2024-01-12 NOTE — Telephone Encounter (Signed)
FYI  Patient stated been having issues with Rx Atorvastatin and want to let you know he spoke with Cardiology

## 2024-01-13 NOTE — Telephone Encounter (Signed)
Its not clear that his symptoms are due to a side effect of the Lipitor. Recommend he come in for visit if symptoms are still persistent.  Katina Degree. Jimmey Ralph, MD 01/13/2024 10:58 AM

## 2024-01-16 NOTE — Telephone Encounter (Signed)
Noted. I think it is fine for him to keep his march 13th appointment.  Katina Degree. Jimmey Ralph, MD 01/16/2024 12:46 PM

## 2024-02-21 DIAGNOSIS — M25511 Pain in right shoulder: Secondary | ICD-10-CM | POA: Diagnosis not present

## 2024-02-21 DIAGNOSIS — Z6826 Body mass index (BMI) 26.0-26.9, adult: Secondary | ICD-10-CM | POA: Diagnosis not present

## 2024-02-21 DIAGNOSIS — M0589 Other rheumatoid arthritis with rheumatoid factor of multiple sites: Secondary | ICD-10-CM | POA: Diagnosis not present

## 2024-02-21 DIAGNOSIS — M542 Cervicalgia: Secondary | ICD-10-CM | POA: Diagnosis not present

## 2024-02-21 DIAGNOSIS — E663 Overweight: Secondary | ICD-10-CM | POA: Diagnosis not present

## 2024-02-21 DIAGNOSIS — R7989 Other specified abnormal findings of blood chemistry: Secondary | ICD-10-CM | POA: Diagnosis not present

## 2024-02-21 DIAGNOSIS — M1991 Primary osteoarthritis, unspecified site: Secondary | ICD-10-CM | POA: Diagnosis not present

## 2024-02-23 ENCOUNTER — Ambulatory Visit: Payer: PPO | Admitting: Family Medicine

## 2024-02-23 VITALS — BP 124/70 | HR 78 | Temp 97.7°F | Ht 70.0 in | Wt 175.6 lb

## 2024-02-23 DIAGNOSIS — Z7984 Long term (current) use of oral hypoglycemic drugs: Secondary | ICD-10-CM | POA: Diagnosis not present

## 2024-02-23 DIAGNOSIS — G4700 Insomnia, unspecified: Secondary | ICD-10-CM | POA: Diagnosis not present

## 2024-02-23 DIAGNOSIS — I152 Hypertension secondary to endocrine disorders: Secondary | ICD-10-CM | POA: Diagnosis not present

## 2024-02-23 DIAGNOSIS — E1151 Type 2 diabetes mellitus with diabetic peripheral angiopathy without gangrene: Secondary | ICD-10-CM | POA: Diagnosis not present

## 2024-02-23 DIAGNOSIS — E1169 Type 2 diabetes mellitus with other specified complication: Secondary | ICD-10-CM | POA: Diagnosis not present

## 2024-02-23 DIAGNOSIS — E1159 Type 2 diabetes mellitus with other circulatory complications: Secondary | ICD-10-CM | POA: Diagnosis not present

## 2024-02-23 DIAGNOSIS — E785 Hyperlipidemia, unspecified: Secondary | ICD-10-CM

## 2024-02-23 LAB — MICROALBUMIN / CREATININE URINE RATIO
Creatinine,U: 121.7 mg/dL
Microalb Creat Ratio: 12.2 mg/g (ref 0.0–30.0)
Microalb, Ur: 1.5 mg/dL (ref 0.0–1.9)

## 2024-02-23 LAB — POCT GLYCOSYLATED HEMOGLOBIN (HGB A1C): Hemoglobin A1C: 7.9 % — AB (ref 4.0–5.6)

## 2024-02-23 MED ORDER — TRAZODONE HCL 50 MG PO TABS
50.0000 mg | ORAL_TABLET | Freq: Every day | ORAL | 3 refills | Status: AC
Start: 2024-02-23 — End: ?

## 2024-02-23 MED ORDER — METFORMIN HCL ER 750 MG PO TB24: 750.0000 mg | ORAL_TABLET | Freq: Two times a day (BID) | ORAL | 3 refills | Status: DC

## 2024-02-23 NOTE — Assessment & Plan Note (Signed)
 Stable on trazodone 50 mg nightly as needed.  Will refill today.

## 2024-02-23 NOTE — Assessment & Plan Note (Signed)
 A1c slightly elevated to 7.9.  We discussed lifestyle modifications.  He would like to work on this before making any medication changes.  We discussed importance of decreasing sweets and starchy carbs.  Will continue metformin 750 mg twice daily.  Recheck in 6 months.

## 2024-02-23 NOTE — Patient Instructions (Addendum)
 It was very nice to see you today!  Your Hemoglobin A1c is up a little bit to 7.9. Please continue to work on diet and exercise.  Please continue to work on cutting down your sweets and carbs.  Return in about 6 months (around 08/25/2024) for Annual Physical.   Take care, Dr Jimmey Ralph  PLEASE NOTE:  If you had any lab tests, please let us know if you have not heard back within a few days. You may see your results on mychart before we have a chance to review them but we will give you a call once they are reviewed by [place].   If we ordered any referrals today, please let us know if you have not heard from their office within the next week.   If you had any urgent prescriptions sent in today, please check with the pharmacy within an hour of our visit to make sure the prescription was transmitted appropriately.   Please try these tips to maintain a healthy lifestyle:  Eat at least 3 REAL meals and 1-2 snacks per day.  Aim for no more than 5 hours between eating.  If you eat breakfast, please do so within one hour of getting up.   Each meal should contain half fruits/vegetables, one quarter protein, and one quarter carbs (no bigger than a computer mouse)  Cut down on sweet beverages. This includes juice, soda, and sweet tea.   Drink at least 1 glass of water with each meal and aim for at least 8 glasses per day  Exercise at least 150 minutes every week.

## 2024-02-23 NOTE — Assessment & Plan Note (Signed)
 Blood pressure at goal on HCTZ 25 milligrams daily and Toprol tartrate 12.5 mg twice daily.

## 2024-02-23 NOTE — Assessment & Plan Note (Signed)
 He is having some side effects with Lipitor including diarrhea.  He is no longer on this.  He will discuss alternatives with cardiology at his appointment next month.

## 2024-02-23 NOTE — Progress Notes (Signed)
   [name] is a 78 y.o. male who presents today for an office visit.  Assessment/Plan:  Chronic Problems Addressed Today: INSOMNIA Stable on trazodone 50 mg nightly as needed.  Will refill today.  Hyperlipidemia due to type 2 diabetes mellitus ([name]) He is having some side effects with Lipitor including diarrhea.  He is no longer on this.  He will discuss alternatives with cardiology at his appointment next month.  DM (diabetes mellitus), type 2 with peripheral vascular complications ([name]) A1c slightly elevated to 7.9.  We discussed lifestyle modifications.  He would like to work on this before making any medication changes.  We discussed importance of decreasing sweets and starchy carbs.  Will continue metformin 750 mg twice daily.  Recheck in 6 months.  Hypertension associated with diabetes ([name]) Blood pressure at goal on HCTZ 25 milligrams daily and Toprol tartrate 12.5 mg twice daily.     Subjective:  HPI:  See Assessment / plan for status of chronic conditions.  Patient here today for follow-up.  Saw him 6 months ago.  A1c at that time was 7.3.  Continue metformin 750 mg twice daily.  Since our last visit he has been following with cardiology as well. Recently he did start to develop diarrhea which he thought was due to the Lipitor. He discussed this with his cardiologist who recommended that he hold for a few weeks. Symptoms improved while off the medications. He restarted the atorvastatin recently and he had a return of symptoms. He is now no longer on the atorvastatin and he is back normal.        Objective:  Physical Exam: BP 124/70   Pulse 78   Temp 97.7 F (36.5 C) (Temporal)   Ht 5\' 10"  (1.778 m)   Wt 175 lb 9.6 oz (79.7 kg)   SpO2 98%   BMI 25.20 kg/m   Gen: No acute distress, resting comfortably CV: Regular rate and rhythm with no murmurs appreciated Pulm: Normal work of breathing, clear to auscultation bilaterally with no crackles, wheezes, or  rhonchi Neuro: Grossly normal, moves all extremities Psych: Normal affect and thought content      Thamar Holik M. Jimmey Ralph, MD 02/23/2024 11:36 AM

## 2024-02-24 ENCOUNTER — Encounter: Payer: Self-pay | Admitting: Family Medicine

## 2024-02-24 DIAGNOSIS — H40013 Open angle with borderline findings, low risk, bilateral: Secondary | ICD-10-CM | POA: Diagnosis not present

## 2024-02-24 NOTE — Progress Notes (Signed)
 Great news.  Urine sample is normal.  We can recheck again in a year.

## 2024-02-27 ENCOUNTER — Encounter: Payer: Self-pay | Admitting: Cardiovascular Disease

## 2024-02-27 DIAGNOSIS — Z79899 Other long term (current) drug therapy: Secondary | ICD-10-CM

## 2024-02-27 DIAGNOSIS — E785 Hyperlipidemia, unspecified: Secondary | ICD-10-CM

## 2024-02-28 MED ORDER — ROSUVASTATIN CALCIUM 20 MG PO TABS: 20.0000 mg | ORAL_TABLET | Freq: Every day | ORAL | 2 refills | Status: DC

## 2024-02-28 NOTE — Telephone Encounter (Signed)
 Hey there, sorry I dont see the next medication to try or I would send it in for you!

## 2024-02-28 NOTE — Telephone Encounter (Signed)
 Pt reports complete relief of nausea, vomiting, and diarrhea (his primary issue) with 4 week statin vacation. Has been on Atorvastatin 80mg  daily. Spoke with Dr Excell Seltzer during clinic who wants patient to try Crestor 20mg  daily with repeat labs in 3 months. Patient aware. Medication sent to pharmacy as requested for a one month supply to trial and see if he can tolerate. Labs entered and released.

## 2024-03-08 DIAGNOSIS — N401 Enlarged prostate with lower urinary tract symptoms: Secondary | ICD-10-CM | POA: Diagnosis not present

## 2024-03-08 DIAGNOSIS — N200 Calculus of kidney: Secondary | ICD-10-CM | POA: Diagnosis not present

## 2024-03-08 DIAGNOSIS — R351 Nocturia: Secondary | ICD-10-CM | POA: Diagnosis not present

## 2024-04-02 ENCOUNTER — Encounter: Payer: Self-pay | Admitting: Cardiovascular Disease

## 2024-04-02 ENCOUNTER — Ambulatory Visit: Payer: PPO | Attending: Cardiovascular Disease | Admitting: Cardiovascular Disease

## 2024-04-02 VITALS — BP 114/70 | HR 86 | Ht 70.0 in | Wt 175.6 lb

## 2024-04-02 DIAGNOSIS — I2510 Atherosclerotic heart disease of native coronary artery without angina pectoris: Secondary | ICD-10-CM | POA: Diagnosis not present

## 2024-04-02 DIAGNOSIS — E1159 Type 2 diabetes mellitus with other circulatory complications: Secondary | ICD-10-CM

## 2024-04-02 DIAGNOSIS — Z79899 Other long term (current) drug therapy: Secondary | ICD-10-CM | POA: Diagnosis not present

## 2024-04-02 DIAGNOSIS — I152 Hypertension secondary to endocrine disorders: Secondary | ICD-10-CM

## 2024-04-02 DIAGNOSIS — E785 Hyperlipidemia, unspecified: Secondary | ICD-10-CM

## 2024-04-02 MED ORDER — ROSUVASTATIN CALCIUM 20 MG PO TABS: 20.0000 mg | ORAL_TABLET | Freq: Every day | ORAL | 3 refills | Status: DC

## 2024-04-02 NOTE — Progress Notes (Signed)
 Cardiology Office Note:    Date:  04/02/2024   ID:  [name], DOB 1945-12-25, MRN [id number]  PCP:  Robert Clamp, MD   [name] Providers Cardiologist:  Robert Lapping, MD     Referring MD: Robert Clamp, MD   Chief Complaint  Patient presents with   Coronary Artery Disease    History of Present Illness:    [name] is a 78 y.o. male with a hx of:  Coronary artery disease  Ant STEMI c/b OOH VF arrest s/p 3.5 x 38 mm DES to LAD LHC 11/11/15: pLAD 100, mLCx 50, mRCA 30, EF 25 Ischemic CM w recovery of LVF EF 25 at time of MI on LV gram TTE 11/14/15: EF 50-55%, dist ant and anterior septal HK  Diabetes mellitus  Hypertension  Hyperlipidemia   He is here alone today. Reports that he is doing well. He had diarrhea with atorvastatin . After switching to rosuvastatin  he has been doing well with no recurrence of diarrhea or loose stools. Today, he denies symptoms of palpitations, chest pain, shortness of breath, orthopnea, PND, lower extremity edema, dizziness, or syncope.  The patient's arthritis has been well-controlled.  He is followed by [name].  He is able to stay quite active.  States that he can ride his bike for about 30 miles, do vigorous work in the yard, and Nurse, adult.  He has no exertional chest discomfort or shortness of breath with any of these activities.  Current Medications: Current Meds  Medication Sig   aspirin  EC 81 MG tablet Take 81 mg by mouth daily.   cetirizine (ZYRTEC) 10 MG tablet Take 10 mg by mouth daily.   Cholecalciferol (VITAMIN D-3) 1000 UNITS CAPS Take 5,000 Units by mouth daily.    famotidine  (PEPCID ) 20 MG tablet Take 1 tablet (20 mg total) by mouth 2 (two) times daily.   finasteride (PROSCAR) 5 MG tablet Take 5 mg by mouth daily.   fluticasone  (FLONASE ) 50 MCG/ACT nasal spray USE 2 SPRAYS IN EACH  NOSTRIL DAILY   folic acid (FOLVITE) 1 MG tablet Take 1 mg by mouth daily.   hydrochlorothiazide   (HYDRODIURIL ) 25 MG tablet 1 tablet in the morning   meloxicam  (MOBIC ) 7.5 MG tablet Take 7.5 mg by mouth daily.   metFORMIN  (GLUCOPHAGE -XR) 750 MG 24 hr tablet Take 1 tablet (750 mg total) by mouth 2 (two) times daily.   methotrexate (RHEUMATREX) 2.5 MG tablet Take 15 mg by mouth once a week. Caution:Chemotherapy. Protect from light.   metoprolol  tartrate (LOPRESSOR ) 25 MG tablet Take 0.5 tablets (12.5 mg total) by mouth 2 (two) times daily.   nitroGLYCERIN  (NITROSTAT ) 0.4 MG SL tablet Place 1 tablet under the tongue as needed. Every 5 minutes for chest pain   Probiotic Product (PRO-BIOTIC BLEND PO) Take 1 tablet by mouth daily.   tamsulosin  (FLOMAX ) 0.4 MG CAPS capsule Take 0.4 mg by mouth as needed.   traMADol (ULTRAM) 50 MG tablet Take 50 mg by mouth every 6 (six) hours as needed for moderate pain.   traZODone  (DESYREL ) 50 MG tablet Take 1 tablet (50 mg total) by mouth at bedtime.   [DISCONTINUED] rosuvastatin  (CRESTOR ) 20 MG tablet Take 1 tablet (20 mg total) by mouth daily.     Allergies:   Penicillins, Contrast media [iodinated contrast media], Sulfamethoxazole, Ibuprofen, and Atorvastatin  calcium    ROS:   Please see the history of present illness.    All other systems reviewed and are negative.  EKGs/Labs/Other Studies Reviewed:    The following studies were reviewed today: Cardiac Studies & Procedures   ______________________________________________________________________________________________ CARDIAC CATHETERIZATION  CARDIAC CATHETERIZATION 11/11/2015  Conclusion 1. Total occlusion of the proximal LAD, treated successfully with primary PCI (drug-eluting stent) 2. Nonobstructive LCx and RCA stenoses 3. Severe segmental LV systolic dysfunction, LVEF 25%  Recommendations: hypothermia protocol per CCM team, post-MI medical therapy and supportive care in CCU setting.  Findings Coronary Findings Diagnostic  Dominance: Right  Left Anterior Descending Thrombotic.  Left  Circumflex  Right Coronary Artery  Intervention  Prox LAD lesion PCI There is no pre-interventional antegrade distal flow (TIMI 0). Pre-stent angioplasty was performed. A drug-eluting stent was placed. Post-stent angioplasty was performed. The post-interventional distal flow is normal (TIMI 3). The intervention was successful. No complications occurred at this lesion. The proximal LAD is 100% occluded with TIMI-0 flow. Heparin  and aggrastat  are used for systemic anticoagulation. Brilinta  180 mg is administered via the OG tube. An XB-LAD guide is used and a Administrator, arts is used to cross the occlusion. The LAD is treated with a 2.5x12 mm balloon on multiple inflations. Angiography shows severe diffuse proximal LAD stenosis post-angioplasty. The vessel is stented with a 3.5x38 mm Promus DES and post-dilated with a 3.75 mm [place] balloon. There is a 0% residual stenosis post intervention.     ECHOCARDIOGRAM  ECHOCARDIOGRAM COMPLETE 11/14/2015  Narrative *[facility]* *[name]* [address] [place], [place] [address] [phone]  ------------------------------------------------------------------- Transthoracic Echocardiography  Patient:    [name] MR #:       [id number] Study Date: 11/14/2015 Gender:     M Age:        68 Height:     170.2 cm Weight:     85.7 kg BSA:        2.04 m^2 Pt. Status: Room:       [facility]  ADMITTING    Robert Lapping, MD ATTENDING    Robert Lapping, MD ORDERING     Robert Lapping, MD REFERRING    Robert Lapping, MD SONOGRAPHER  [name], RDCS PERFORMING   [facility], Inpatient  cc:  ------------------------------------------------------------------- LV EF: 50% -   55%  ------------------------------------------------------------------- Indications:      Chest pain 786.51.  ------------------------------------------------------------------- History:   PMH:   Coronary artery disease.  PMH:   Sudden death episode.   Ventricular fibrillation.  Risk factors:  Former tobacco use. Hypertension.  ------------------------------------------------------------------- Study Conclusions  - Left ventricle: The cavity size was normal. Systolic function was normal. The estimated ejection fraction was in the range of 50% to 55%. There may be hypokinesis of the distal anterior wall and anterior septum. The study is not technically sufficient to allow evaluation of LV diastolic function.  Recommendations:  Difficult study due to tachycardia and poor imaging windows, even after Definity  LV cavity opacification. There is probably a wall motion abnormality in the distribution of the distal LAD artery. Suggest repeat evaluation when heart rate is slower.             Transthoracic echocardiography.  M-mode, complete 2D, spectral Doppler, and color Doppler.  Birthdate: Patient birthdate: 1946/04/04.  Age:  Patient is 78 yr old.  Sex: Gender: male.    BMI: 29.6 kg/m^2.  Blood pressure:     109/44 Patient status:  Inpatient.  Study date:  Study date: 11/14/2015. Study time: 11:10 AM.  Location:  ICU/CCU  -------------------------------------------------------------------  ------------------------------------------------------------------- Left ventricle:  The cavity size was normal. Systolic function was normal.  The estimated ejection fraction was in the range of 50% to 55%. There may be hypokinesis of the distal anterior wall and anterior septum. The study is not technically sufficient to allow evaluation of LV diastolic function.  ------------------------------------------------------------------- Aortic valve:  Poorly visualized.  Normal thickness leaflets. Mobility was not restricted.  Doppler:  Transvalvular velocity was within the normal range. There was no stenosis. There was no regurgitation.  ------------------------------------------------------------------- Aorta:  Aortic root: The aortic root was  normal in size.  ------------------------------------------------------------------- Mitral valve:   Structurally normal valve.   Mobility was not restricted.  Doppler:  Transvalvular velocity was within the normal range. There was no evidence for stenosis. There was no regurgitation.  ------------------------------------------------------------------- Left atrium:  The atrium was normal in size.  ------------------------------------------------------------------- Right ventricle:  The cavity size was normal. Wall thickness was normal. Systolic function was normal.  ------------------------------------------------------------------- Pulmonic valve:   Poorly visualized.  Doppler:  Transvalvular velocity was within the normal range. There was no evidence for stenosis.  ------------------------------------------------------------------- Tricuspid valve:   Structurally normal valve.   Leaflet separation was normal.  Doppler:  Transvalvular velocity was within the normal range. There was no regurgitation.  ------------------------------------------------------------------- Pulmonary artery:   Poorly visualized. The main pulmonary artery was normal-sized. Systolic pressure was within the normal range.  ------------------------------------------------------------------- Right atrium:  The atrium was normal in size.  ------------------------------------------------------------------- Pericardium:  There was no pericardial effusion.  ------------------------------------------------------------------- Systemic veins: Inferior vena cava: The vessel was normal in size. The respirophasic diameter changes were in the normal range (= 50%), consistent with normal central venous pressure.  ------------------------------------------------------------------- Measurements  Left ventricle                           Value        Reference LV ID, ED, PLAX chordal                  45.4  mm     43  - 52 LV ID, ES, PLAX chordal                  36    mm     23 - 38 LV fx shortening, PLAX chordal (L)       21    %      >=29 LV PW thickness, ED                      10.2  mm     --------- IVS/LV PW ratio, ED                      1.03         <=1.3  Ventricular septum                       Value        Reference IVS thickness, ED                        10.5  mm     ---------  LVOT                                     Value        Reference LVOT ID, S  20    mm     --------- LVOT area                                3.14  cm^2   ---------  Aorta                                    Value        Reference Aortic root ID, ED                       30    mm     ---------  Left atrium                              Value        Reference LA ID, A-P, ES                           28    mm     --------- LA ID/bsa, A-P                           1.37  cm/m^2 <=2.2 LA volume, ES, 1-p A4C                   25.1  ml     --------- LA volume/bsa, ES, 1-p A4C               12.3  ml/m^2 ---------  Legend: (L)  and  (H)  mark values outside specified reference range.  ------------------------------------------------------------------- Prepared and Electronically Authenticated by  Luana Rumple, MD 2016-12-02T16:57:58          ______________________________________________________________________________________________      EKG:   EKG Interpretation Date/Time:  Monday April 02 2024 15:30:28 EDT Ventricular Rate:  86 PR Interval:  154 QRS Duration:  80 QT Interval:  402 QTC Calculation: 481 R Axis:   73  Text Interpretation: Sinus rhythm with occasional Premature ventricular complexes Cannot rule out Inferior infarct , age undetermined Confirmed by [name] [phone]) on 04/02/2024 4:04:15 PM    Recent Labs: 08/23/2023: ALT 27; BUN 20; Creatinine 1.1; Hemoglobin 16.1; Platelets 226; Potassium 4.2; Sodium 142  Recent Lipid Panel    Component Value  Date/Time   CHOL 125 03/17/2022 0842   TRIG 187 (H) 03/17/2022 0842   HDL 32 (L) 03/17/2022 0842   CHOLHDL 3.9 03/17/2022 0842   CHOLHDL 3 06/29/2018 1153   VLDL 42.8 (H) 06/29/2018 1153   LDLCALC 62 03/17/2022 0842   LDLDIRECT 49 03/21/2023 1218   LDLDIRECT 62.0 06/29/2018 1153     Risk Assessment/Calculations:                Physical Exam:    VS:  BP 114/70   Pulse 86   Ht 5\' 10"  (1.778 m)   Wt 175 lb 9.6 oz (79.7 kg)   SpO2 96%   BMI 25.20 kg/m     Wt Readings from Last 3 Encounters:  04/02/24 175 lb 9.6 oz (79.7 kg)  02/23/24 175 lb 9.6 oz (79.7 kg)  08/25/23 172 lb 6.4 oz (78.2 kg)     GEN:  Well nourished, well developed in no acute distress HEENT:  Normal NECK: No JVD; No carotid bruits LYMPHATICS: No lymphadenopathy CARDIAC: RRR, no murmurs, rubs, gallops RESPIRATORY:  Clear to auscultation without rales, wheezing or rhonchi  ABDOMEN: Soft, non-tender, non-distended MUSCULOSKELETAL:  No edema; No deformity  SKIN: Warm and dry NEUROLOGIC:  Alert and oriented x 3 PSYCHIATRIC:  Normal affect   Assessment & Plan Coronary artery disease involving native coronary artery of native heart without angina pectoris Tolerating ASA, statin Rx. Continue current Rx, no angina reported.  He maintains an excellent activity level and reports no exertional symptoms. Hyperlipidemia LDL goal <70 Doing well on rosuvastatin . Needs follow-up lipids/LFT's in 3 months. Hypertension associated with diabetes ([name]) BP well-controlled on hydrochlorothiazide , metoprolol .  Medication management As above, doing better off of atorvastatin . To date he is tolerating rosuvastatin  well.  Check lipids and LFTs in 3 months as above.     Medication Adjustments/Labs and Tests Ordered: Current medicines are reviewed at length with the patient today.  Concerns regarding medicines are outlined above.  Orders Placed This Encounter  Procedures   EKG 12-Lead   EKG 12-Lead   Meds ordered this  encounter  Medications   rosuvastatin  (CRESTOR ) 20 MG tablet    Sig: Take 1 tablet (20 mg total) by mouth daily.    Dispense:  90 tablet    Refill:  3    Patient Instructions  Medication Instructions:  REFILLED Crestor /Rosuvastatin  *If you need a refill on your cardiac medications before your next appointment, please call your pharmacy*  Lab Work: Lipids, Liver in 3 months (at PCP office) If you have labs (blood work) drawn today and your tests are completely normal, you will receive your results only by: MyChart Message (if you have MyChart) OR A paper copy in the mail If you have any lab test that is abnormal or we need to change your treatment, we will call you to review the results.  Follow-Up: At [facility], you and your health needs are our priority.  As part of our continuing mission to provide you with exceptional heart care, our providers are all part of one team.  This team includes your primary Cardiologist (physician) and Advanced Practice Providers or APPs (Physician Assistants and Nurse Practitioners) who all work together to provide you with the care you need, when you need it.  Your next appointment:   1 year(s)  Provider:   Arnoldo Lapping, MD       1st Floor: - Lobby - Registration  - Pharmacy  - Lab - Cafe  2nd Floor: - PV Lab - Diagnostic Testing (echo, CT, nuclear med)  3rd Floor: - Vacant  4th Floor: - TCTS (cardiothoracic surgery) - AFib Clinic - Structural Heart Clinic - Vascular Surgery  - Vascular Ultrasound  5th Floor: - [facility] (general and EP) - Clinical Pharmacy for coumadin, hypertension, lipid, weight-loss medications, and med management appointments    Valet parking services will be available as well.     Signed, Robert Lapping, MD  04/02/2024 4:19 PM    [facility]

## 2024-04-02 NOTE — Assessment & Plan Note (Signed)
 BP well-controlled on hydrochlorothiazide , metoprolol .

## 2024-04-02 NOTE — Patient Instructions (Signed)
 Medication Instructions:  REFILLED Crestor /Rosuvastatin  *If you need a refill on your cardiac medications before your next appointment, please call your pharmacy*  Lab Work: Lipids, Liver in 3 months (at PCP office) If you have labs (blood work) drawn today and your tests are completely normal, you will receive your results only by: MyChart Message (if you have MyChart) OR A paper copy in the mail If you have any lab test that is abnormal or we need to change your treatment, we will call you to review the results.  Follow-Up: At [facility], you and your health needs are our priority.  As part of our continuing mission to provide you with exceptional heart care, our providers are all part of one team.  This team includes your primary Cardiologist (physician) and Advanced Practice Providers or APPs (Physician Assistants and Nurse Practitioners) who all work together to provide you with the care you need, when you need it.  Your next appointment:   1 year(s)  Provider:   Arnoldo Lapping, MD       1st Floor: - Lobby - Registration  - Pharmacy  - Lab - Cafe  2nd Floor: - PV Lab - Diagnostic Testing (echo, CT, nuclear med)  3rd Floor: - Vacant  4th Floor: - TCTS (cardiothoracic surgery) - AFib Clinic - Structural Heart Clinic - Vascular Surgery  - Vascular Ultrasound  5th Floor: - [facility] (general and EP) - Clinical Pharmacy for coumadin, hypertension, lipid, weight-loss medications, and med management appointments    Valet parking services will be available as well.

## 2024-04-02 NOTE — Assessment & Plan Note (Addendum)
 Tolerating ASA, statin Rx. Continue current Rx, no angina reported.  He maintains an excellent activity level and reports no exertional symptoms.

## 2024-04-19 ENCOUNTER — Encounter (HOSPITAL_COMMUNITY): Payer: Self-pay

## 2024-04-26 ENCOUNTER — Ambulatory Visit: Payer: HMO

## 2024-05-23 ENCOUNTER — Encounter: Payer: Self-pay | Admitting: Cardiovascular Disease

## 2024-05-23 DIAGNOSIS — M0589 Other rheumatoid arthritis with rheumatoid factor of multiple sites: Secondary | ICD-10-CM | POA: Diagnosis not present

## 2024-05-24 LAB — LAB REPORT - SCANNED: EGFR: 57

## 2024-05-25 ENCOUNTER — Other Ambulatory Visit: Payer: Self-pay | Admitting: Cardiovascular Disease

## 2024-05-25 DIAGNOSIS — Z79899 Other long term (current) drug therapy: Secondary | ICD-10-CM

## 2024-05-25 DIAGNOSIS — E785 Hyperlipidemia, unspecified: Secondary | ICD-10-CM

## 2024-07-23 ENCOUNTER — Other Ambulatory Visit (HOSPITAL_BASED_OUTPATIENT_CLINIC_OR_DEPARTMENT_OTHER): Payer: Self-pay

## 2024-07-23 DIAGNOSIS — I10 Essential (primary) hypertension: Secondary | ICD-10-CM

## 2024-07-23 MED ORDER — METOPROLOL TARTRATE 25 MG PO TABS: 12.5000 mg | ORAL_TABLET | Freq: Two times a day (BID) | ORAL | 3 refills | Status: AC

## 2024-07-31 ENCOUNTER — Encounter: Payer: Self-pay | Admitting: Family Medicine

## 2024-08-01 NOTE — Telephone Encounter (Signed)
 I appreciate the update.  It is okay for him to take the Levaquin  with all of his other listed medications.

## 2024-08-01 NOTE — Telephone Encounter (Signed)
**Note De-identified  [place] Obfuscation** Please advise 

## 2024-08-23 DIAGNOSIS — M542 Cervicalgia: Secondary | ICD-10-CM | POA: Diagnosis not present

## 2024-08-23 DIAGNOSIS — M1991 Primary osteoarthritis, unspecified site: Secondary | ICD-10-CM | POA: Diagnosis not present

## 2024-08-23 DIAGNOSIS — M25511 Pain in right shoulder: Secondary | ICD-10-CM | POA: Diagnosis not present

## 2024-08-23 DIAGNOSIS — Z6825 Body mass index (BMI) 25.0-25.9, adult: Secondary | ICD-10-CM | POA: Diagnosis not present

## 2024-08-23 DIAGNOSIS — E663 Overweight: Secondary | ICD-10-CM | POA: Diagnosis not present

## 2024-08-23 DIAGNOSIS — M0589 Other rheumatoid arthritis with rheumatoid factor of multiple sites: Secondary | ICD-10-CM | POA: Diagnosis not present

## 2024-08-23 DIAGNOSIS — R7989 Other specified abnormal findings of blood chemistry: Secondary | ICD-10-CM | POA: Diagnosis not present

## 2024-08-27 ENCOUNTER — Ambulatory Visit (INDEPENDENT_AMBULATORY_CARE_PROVIDER_SITE_OTHER)

## 2024-08-27 ENCOUNTER — Ambulatory Visit: Admitting: Family Medicine

## 2024-08-27 ENCOUNTER — Encounter: Payer: Self-pay | Admitting: Family Medicine

## 2024-08-27 VITALS — BP 120/64 | HR 58 | Temp 98.4°F | Ht 70.0 in | Wt 173.4 lb

## 2024-08-27 VITALS — BP 135/78 | HR 72 | Temp 97.3°F | Ht 70.0 in | Wt 173.2 lb

## 2024-08-27 DIAGNOSIS — Z7985 Long-term (current) use of injectable non-insulin antidiabetic drugs: Secondary | ICD-10-CM | POA: Diagnosis not present

## 2024-08-27 DIAGNOSIS — Z0001 Encounter for general adult medical examination with abnormal findings: Secondary | ICD-10-CM

## 2024-08-27 DIAGNOSIS — E1151 Type 2 diabetes mellitus with diabetic peripheral angiopathy without gangrene: Secondary | ICD-10-CM | POA: Diagnosis not present

## 2024-08-27 DIAGNOSIS — E785 Hyperlipidemia, unspecified: Secondary | ICD-10-CM | POA: Diagnosis not present

## 2024-08-27 DIAGNOSIS — E1169 Type 2 diabetes mellitus with other specified complication: Secondary | ICD-10-CM | POA: Diagnosis not present

## 2024-08-27 DIAGNOSIS — I152 Hypertension secondary to endocrine disorders: Secondary | ICD-10-CM | POA: Diagnosis not present

## 2024-08-27 DIAGNOSIS — I2510 Atherosclerotic heart disease of native coronary artery without angina pectoris: Secondary | ICD-10-CM

## 2024-08-27 DIAGNOSIS — M069 Rheumatoid arthritis, unspecified: Secondary | ICD-10-CM | POA: Diagnosis not present

## 2024-08-27 DIAGNOSIS — Z0000 Encounter for general adult medical examination without abnormal findings: Secondary | ICD-10-CM

## 2024-08-27 DIAGNOSIS — E1159 Type 2 diabetes mellitus with other circulatory complications: Secondary | ICD-10-CM | POA: Diagnosis not present

## 2024-08-27 LAB — POCT GLYCOSYLATED HEMOGLOBIN (HGB A1C): Hemoglobin A1C: 8.8 % — AB (ref 4.0–5.6)

## 2024-08-27 MED ORDER — TIRZEPATIDE 2.5 MG/0.5ML ~~LOC~~ SOAJ: 2.5000 mg | SUBCUTANEOUS | 0 refills | Status: DC

## 2024-08-27 MED ORDER — TIRZEPATIDE 5 MG/0.5ML ~~LOC~~ SOAJ: 5.0000 mg | SUBCUTANEOUS | 0 refills | Status: DC

## 2024-08-27 NOTE — Assessment & Plan Note (Signed)
 A1c uncontrolled today 8.8.  Had lengthy discussion with patient today regarding diabetes management.  He is currently on metformin  750 mg twice daily and has made significant changes to his diet and lifestyle without much improvement.  He is frustrated by lack of improvement in his A1c.  We discussed medication changes.  We will start Mounjaro  2.5 mg weekly for 4 weeks and then increase to 5 mg weekly.  We discussed potential side effects.  He also okay for him to stop the metformin  on the Mounjaro .  Will recheck his A1c in 3 months.  Depending on response to Mounjaro  would consider trial of Jardiance or Rybelsus.

## 2024-08-27 NOTE — Assessment & Plan Note (Signed)
 Management per rheumatology.

## 2024-08-27 NOTE — Patient Instructions (Signed)
 [name],  Thank you for taking the time for your Medicare Wellness Visit. I appreciate your continued commitment to your health goals. Please review the care plan we discussed, and feel free to reach out if I can assist you further.  Medicare recommends these wellness visits once per year to help you and your care team stay ahead of potential health issues. These visits are designed to focus on prevention, allowing your provider to concentrate on managing your acute and chronic conditions during your regular appointments.  Please note that Annual Wellness Visits do not include a physical exam. Some assessments may be limited, especially if the visit was conducted virtually. If needed, we may recommend a separate in-person follow-up with your provider.  Ongoing Care Seeing your primary care provider every 3 to 6 months helps us  monitor your health and provide consistent, personalized care.   Referrals If a referral was made during today's visit and you haven't received any updates within two weeks, please contact the referred provider directly to check on the status.  Recommended Screenings:  Health Maintenance  Topic Date Due   Complete foot exam   08/19/2022   DTaP/Tdap/Td vaccine (2 - Td or Tdap) 12/26/2023   Medicare Annual Wellness Visit  04/20/2024   Flu Shot  07/13/2024   COVID-19 Vaccine (8 - Pfizer risk 2024-25 season) 08/13/2024   Hemoglobin A1C  08/25/2024   Eye exam for diabetics  08/25/2024   Yearly kidney health urinalysis for diabetes  02/22/2025   Yearly kidney function blood test for diabetes  05/24/2025   Pneumococcal Vaccine for age over 55  Completed   Hepatitis C Screening  Completed   Zoster (Shingles) Vaccine  Completed   HPV Vaccine  Aged Out   Meningitis B Vaccine  Aged Out   Cologuard (Stool DNA test)  Discontinued       04/21/2023   10:09 AM  Advanced Directives  Does Patient Have a Medical Advance Directive? Yes  Type of Special educational needs teacher of [place];Living will  Copy of Healthcare Power of Attorney in Chart? No - copy requested   Advance Care Planning is important because it: Ensures you receive medical care that aligns with your values, goals, and preferences. Provides guidance to your family and loved ones, reducing the emotional burden of decision-making during critical moments.  Vision: Annual vision screenings are recommended for early detection of glaucoma, cataracts, and diabetic retinopathy. These exams can also reveal signs of chronic conditions such as diabetes and high blood pressure.  Dental: Annual dental screenings help detect early signs of oral cancer, gum disease, and other conditions linked to overall health, including heart disease and diabetes.  Please see the attached documents for additional preventive care recommendations.

## 2024-08-27 NOTE — Progress Notes (Signed)
 Chief Complaint:  [name] is a 78 y.o. male who presents today for his annual comprehensive physical exam.    Assessment/Plan:  Chronic Problems Addressed Today: DM (diabetes mellitus), type 2 with peripheral vascular complications ([name]) A1c uncontrolled today 8.8.  Had lengthy discussion with patient today regarding diabetes management.  He is currently on metformin  750 mg twice daily and has made significant changes to his diet and lifestyle without much improvement.  He is frustrated by lack of improvement in his A1c.  We discussed medication changes.  We will start Mounjaro  2.5 mg weekly for 4 weeks and then increase to 5 mg weekly.  We discussed potential side effects.  He also okay for him to stop the metformin  on the Mounjaro .  Will recheck his A1c in 3 months.  Depending on response to Mounjaro  would consider trial of Jardiance or Rybelsus.  Hyperlipidemia due to type 2 diabetes mellitus ([name]) Follows with cardiology.  He is on Crestor  20 mg daily.  Hypertension associated with diabetes ([name]) Blood pressure at goal today on HCTZ 25 mg daily and metoprolol  tartrate 12.5 mg twice daily.  Rheumatoid arthritis ([name]) Management per rheumatology.  Preventative Healthcare: Recently had labs-do not need to repeat today.  Will be seeing his eye doctor soon for eye exam.  Up-to-date on vaccines.  Patient Counseling(The following topics were reviewed and/or handout was given):  -Nutrition: Stressed importance of moderation in sodium/caffeine intake, saturated fat and cholesterol, caloric balance, sufficient intake of fresh fruits, vegetables, and fiber.  -Stressed the importance of regular exercise.   -Substance Abuse: Discussed cessation/primary prevention of tobacco, alcohol, or other drug use; driving or other dangerous activities under the influence; availability of treatment for abuse.   -Injury prevention: Discussed safety belts, safety helmets, smoke detector, smoking near  bedding or upholstery.   -Sexuality: Discussed sexually transmitted diseases, partner selection, use of condoms, avoidance of unintended pregnancy and contraceptive alternatives.   -Dental health: Discussed importance of regular tooth brushing, flossing, and dental visits.  -Health maintenance and immunizations reviewed. Please refer to Health maintenance section.  Return to care in 1 year for next preventative visit.     Subjective:  HPI:  He has no acute complaints today. Patient is here today for his  annual physical.  See assessment / plan for status of chronic conditions.  Discussed the use of AI scribe software for clinical note transcription with the patient, who gave verbal consent to proceed.  History of Present Illness [name] is a 78 year old male with diabetes who presents for a routine physical exam and to discuss blood sugar management.  He has been experiencing difficulty in controlling his blood sugar levels despite dietary efforts. A recent fasting blood sugar reading was 357 mg/dL, followed by a reading of 152 mg/dL after physical activity without fasting. His hemoglobin A1c has increased to 8.8% from a previous level of 7%.  His diet consists of chicken, fresh foods, and minimal processed foods. He avoids sugar and soda, typically having a bagel or Cheerios for breakfast, a sandwich on whole wheat or honey wheat bread for lunch, and simple dinners. He occasionally snacks on a pretzel after dinner.  He engages in regular physical activity, including biking 15 miles twice a week and doing yard work. However, he has reduced kayaking due to a shoulder ligament injury. Despite these efforts, he is frustrated that his blood sugar remains high.  He is currently taking metformin  for diabetes management. He has  a family history of diabetes, with his father having had the condition.  No symptoms of blurred vision or fatigue. He mentions a past experience with  Humira for arthritis, which he discontinued after a heart attack, and a recent change in cholesterol medication to Crestor  due to gastrointestinal side effects from a previous medication.     Lifestyle Diet: Balanced.  Trying to get plenty of fruits and vegetables.  Try to work on cutting down sugar and carbs. Exercise: Bikes 15 miles twice per week and does a lot of yard work     08/27/2024   10:32 AM  Depression screen PHQ 2/9  Decreased Interest 0  Down, Depressed, Hopeless 0  PHQ - 2 Score 0    Health Maintenance Due  Topic Date Due   FOOT EXAM  08/19/2022   OPHTHALMOLOGY EXAM  08/25/2024     ROS: Per HPI, otherwise a complete review of systems was negative.   PMH:  The following were reviewed and entered/updated in epic: Past Medical History:  Diagnosis Date   Allergic rhinitis    Arthritis    CAD (coronary artery disease)    a. 11/11/15 - OOH VF arrest 2/2 ant STEMI >> CPR//AED shock x 2 >> ROSC >> LHC: pLAD 100 (Promus DES), mLCx 50, mRCA 30, EF 25% [managed with hypothermia protocol]   Fractured rib    GERD (gastroesophageal reflux disease)    History of Clostridium difficile colitis 10/2015   post MI >> required IV Flagyl  and IV Vanc >> DC on po Vanc   History of kidney stones    History of lithotripsy    HLD (hyperlipidemia)    Hypertension    Impaired glucose tolerance    Insomnia    Ischemic cardiomyopathy    a. EF at [facility] 11/11/15 25%;  b. Echo 11/14/15: EF 50-55%, dist ant and anterior septal HK   Nephrolithiasis 1968   Pelvic fracture ([name]) 09/2015   Pre-diabetes    Rheumatoid arthritis(714.0) 1998   ST elevation (STEMI) myocardial infarction involving left anterior descending coronary artery ([name]) 11/11/15   100% LAD, Cardiogenic Shock. -- EF ~20-25%   Patient Active Problem List   Diagnosis Date Noted   Sciatica 08/13/2020   Fatigue 01/01/2019   Abdominal bloating 06/29/2018   DM (diabetes mellitus), type 2 with peripheral vascular complications  ([name]) 05/07/2016   Hyperlipidemia due to type 2 diabetes mellitus ([name]) 12/22/2015   CAD (coronary artery disease) 11/12/2015   Cardiomyopathy, ischemic - EF 20-25% by LV Gram 11/12/2015   Hypertension associated with diabetes ([name]) 03/24/2010   TOBACCO USE, QUIT 09/23/2009   Allergic rhinitis 12/18/2008   Rheumatoid arthritis ([name]) 12/18/2008   INSOMNIA 12/18/2008   Nephrolithiasis 12/18/2008   Past Surgical History:  Procedure Laterality Date   CARDIAC CATHETERIZATION N/A 11/11/2015   Procedure: Left Heart Cath and Cors/Grafts Angiography;  Surgeon: Ozell Fell, MD;  Location: [facility] INVASIVE CV LAB;  Service: Cardiovascular;  Laterality: N/A;   CARDIAC CATHETERIZATION N/A 11/11/2015   Procedure: Coronary Stent Intervention;  Surgeon: Ozell Fell, MD;  Location: [facility] INVASIVE CV LAB;  Service: Cardiovascular;  Laterality: N/A;  prox lad 3.5x38 promus   CORONARY ANGIOPLASTY WITH STENT PLACEMENT  2016   EXTRACORPOREAL SHOCK WAVE LITHOTRIPSY Left 08/29/2017   Procedure: LEFT EXTRACORPOREAL SHOCK WAVE LITHOTRIPSY (ESWL);  Surgeon: Renda Glance, MD;  Location: [facility] ORS;  Service: Urology;  Laterality: Left;   status post multiple lithotripy and urological surgery for reccurent calcium  oxalate stones  Family History  Problem Relation Age of Onset   Cancer Mother        pancreatic ; pulmonary embolism    Diabetes Father     Medications- reviewed and updated Current Outpatient Medications  Medication Sig Dispense Refill   tirzepatide  (MOUNJARO ) 2.5 MG/0.5ML Pen Inject 2.5 mg into the skin once a week. 2 mL 0   tirzepatide  (MOUNJARO ) 5 MG/0.5ML Pen Inject 5 mg into the skin once a week. 6 mL 0   aspirin  EC 81 MG tablet Take 81 mg by mouth daily.     BOOSTRIX 5-2.5-18.5 LF-MCG/0.5 injection      cetirizine (ZYRTEC) 10 MG tablet Take 10 mg by mouth daily.     Cholecalciferol (VITAMIN D-3) 1000 UNITS CAPS Take 5,000 Units by mouth daily.      famotidine  (PEPCID ) 20 MG tablet Take 1  tablet (20 mg total) by mouth 2 (two) times daily. 180 tablet 0   finasteride (PROSCAR) 5 MG tablet Take 5 mg by mouth daily.     fluticasone  (FLONASE ) 50 MCG/ACT nasal spray USE 2 SPRAYS IN EACH  NOSTRIL DAILY 48 g 3   folic acid (FOLVITE) 1 MG tablet Take 1 mg by mouth daily.     hydrochlorothiazide  (HYDRODIURIL ) 25 MG tablet 1 tablet in the morning     meloxicam  (MOBIC ) 7.5 MG tablet Take 7.5 mg by mouth daily.     methotrexate (RHEUMATREX) 2.5 MG tablet Take 15 mg by mouth once a week. Caution:Chemotherapy. Protect from light.     metoprolol  tartrate (LOPRESSOR ) 25 MG tablet Take 0.5 tablets (12.5 mg total) by mouth 2 (two) times daily. 180 tablet 3   nitroGLYCERIN  (NITROSTAT ) 0.4 MG SL tablet Place 1 tablet under the tongue as needed. Every 5 minutes for chest pain  3   Probiotic Product (PRO-BIOTIC BLEND PO) Take 1 tablet by mouth daily.     rosuvastatin  (CRESTOR ) 20 MG tablet TAKE 1 TABLET BY MOUTH EVERY DAY 90 tablet 3   SPIKEVAX syringe      tamsulosin  (FLOMAX ) 0.4 MG CAPS capsule Take 0.4 mg by mouth as needed.     traMADol (ULTRAM) 50 MG tablet Take 50 mg by mouth every 6 (six) hours as needed for moderate pain.     traZODone  (DESYREL ) 50 MG tablet Take 1 tablet (50 mg total) by mouth at bedtime. 90 tablet 3   No current facility-administered medications for this visit.    Allergies-reviewed and updated Allergies  Allergen Reactions   Penicillins Anaphylaxis   Contrast Media [Iodinated Contrast Media] Hives    03/28/14 pt received 1 hour emergent prep and pt did good and had no complaints after scan when given IV benadryl  first. Had reaction 1 time, tolerated fine in 03/2014 with Benadryl    Sulfamethoxazole Hives    Childhood reaction   Ibuprofen Swelling    Happened 1 time   Atorvastatin  Calcium  Diarrhea and Nausea And Vomiting    Social History   Socioeconomic History   Marital status: Married    Spouse name: Not on file   Number of children: Not on file   Years of  education: Not on file   Highest education level: Associate degree: academic program  Occupational History   Occupation: retired  Tobacco Use   Smoking status: Former   Smokeless tobacco: Never   Tobacco comments:    quit 30 yrs ago  Vaping Use   Vaping status: Never Used  Substance and Sexual Activity   Alcohol use: No  Drug use: No   Sexual activity: Not on file  Other Topics Concern   Not on file  Social History Narrative   Not on file   Social Drivers of Health   Financial Resource Strain: Low Risk  (08/23/2024)   Overall Financial Resource Strain (CARDIA)    Difficulty of Paying Living Expenses: Not hard at all  Food Insecurity: No Food Insecurity (08/23/2024)   Hunger Vital Sign    Worried About Running Out of Food in the Last Year: Never true    Ran Out of Food in the Last Year: Never true  Transportation Needs: No Transportation Needs (08/23/2024)   PRAPARE - Administrator, Civil Service (Medical): No    Lack of Transportation (Non-Medical): No  Physical Activity: Sufficiently Active (08/23/2024)   Exercise Vital Sign    Days of Exercise per Week: 3 days    Minutes of Exercise per Session: 50 min  Stress: No Stress Concern Present (08/23/2024)   [employer] of Occupational Health - Occupational Stress Questionnaire    Feeling of Stress: Not at all  Social Connections: Socially Isolated (08/23/2024)   Social Connection and Isolation Panel    Frequency of Communication with Friends and Family: Once a week    Frequency of Social Gatherings with Friends and Family: Once a week    Attends Religious Services: Never    Database administrator or Organizations: No    Attends Engineer, structural: Not on file    Marital Status: Married        Objective:  Physical Exam: BP 135/78   Pulse 72   Temp (!) 97.3 F (36.3 C) (Temporal)   Ht 5' 10 (1.778 m)   Wt 173 lb 3.2 oz (78.6 kg)   SpO2 96%   BMI 24.85 kg/m   Body mass index is 24.85  kg/m. Wt Readings from Last 3 Encounters:  08/27/24 173 lb 3.2 oz (78.6 kg)  08/27/24 173 lb 6.4 oz (78.7 kg)  04/02/24 175 lb 9.6 oz (79.7 kg)   Gen: NAD, resting comfortably HEENT: TMs normal bilaterally. OP clear. No thyromegaly noted.  CV: RRR with no murmurs appreciated Pulm: NWOB, CTAB with no crackles, wheezes, or rhonchi GI: Normal bowel sounds present. Soft, Nontender, Nondistended. MSK: no edema, cyanosis, or clubbing noted Skin: warm, dry Neuro: CN2-12 grossly intact. Strength 5/5 in upper and lower extremities. Reflexes symmetric and intact bilaterally.  Psych: Normal affect and thought content     Lejla Moeser M. Kennyth, MD 08/27/2024 11:22 AM

## 2024-08-27 NOTE — Assessment & Plan Note (Signed)
 Blood pressure at goal today on HCTZ 25 mg daily and metoprolol  tartrate 12.5 mg twice daily.

## 2024-08-27 NOTE — Patient Instructions (Signed)
 It was very nice to see you today!  VISIT SUMMARY: Today, we discussed your difficulty in controlling blood sugar levels despite dietary efforts and physical activity. We reviewed your recent blood sugar readings and A1c levels, and made changes to your diabetes medication plan.  YOUR PLAN: TYPE 2 DIABETES MELLITUS WITH POOR GLYCEMIC CONTROL: Your blood sugar levels and A1c have been higher than desired, indicating that your current medication is not sufficient. -Start taking Mounjaro  (tirzepatide ) as prescribed: an initial dose for 4 weeks, then transition to a maintenance dose. -Discontinue metformin  when you start Mounjaro . -Schedule a follow-up appointment in 3 months to reassess your A1c and blood sugar control. -Report any issues or concerns with Mounjaro .  GENERAL HEALTH MAINTENANCE: We reviewed your vaccination schedule. -Get your COVID-19 booster in November. -Continue with routine vaccinations as needed.  Return in about 3 months (around 11/26/2024) for Follow Up.   Take care, Dr Kennyth  PLEASE NOTE:  If you had any lab tests, please let us  know if you have not heard back within a few days. You may see your results on mychart before we have a chance to review them but we will give you a call once they are reviewed by us .   If we ordered any referrals today, please let us  know if you have not heard from their office within the next week.   If you had any urgent prescriptions sent in today, please check with the pharmacy within an hour of our visit to make sure the prescription was transmitted appropriately.   Please try these tips to maintain a healthy lifestyle:  Eat at least 3 REAL meals and 1-2 snacks per day.  Aim for no more than 5 hours between eating.  If you eat breakfast, please do so within one hour of getting up.   Each meal should contain half fruits/vegetables, one quarter protein, and one quarter carbs (no bigger than a computer mouse)  Cut down on sweet  beverages. This includes juice, soda, and sweet tea.   Drink at least 1 glass of water  with each meal and aim for at least 8 glasses per day  Exercise at least 150 minutes every week.    Preventive Care 65 Years and Older, Male Preventive care refers to lifestyle choices and visits with your health care provider that can promote health and wellness. Preventive care visits are also called wellness exams. What can I expect for my preventive care visit? Counseling During your preventive care visit, your health care provider may ask about your: Medical history, including: Past medical problems. Family medical history. History of falls. Current health, including: Emotional well-being. Home life and relationship well-being. Sexual activity. Memory and ability to understand (cognition). Lifestyle, including: Alcohol, nicotine or tobacco, and drug use. Access to firearms. Diet, exercise, and sleep habits. Work and work Astronomer. Sunscreen use. Safety issues such as seatbelt and bike helmet use. Physical exam Your health care provider will check your: Height and weight. These may be used to calculate your BMI (body mass index). BMI is a measurement that tells if you are at a healthy weight. Waist circumference. This measures the distance around your waistline. This measurement also tells if you are at a healthy weight and may help predict your risk of certain diseases, such as type 2 diabetes and high blood pressure. Heart rate and blood pressure. Body temperature. Skin for abnormal spots. What immunizations do I need?  Vaccines are usually given at various ages, according to a schedule.  Your health care provider will recommend vaccines for you based on your age, medical history, and lifestyle or other factors, such as travel or where you work. What tests do I need? Screening Your health care provider may recommend screening tests for certain conditions. This may include: Lipid and  cholesterol levels. Diabetes screening. This is done by checking your blood sugar (glucose) after you have not eaten for a while (fasting). Hepatitis C test. Hepatitis B test. HIV (human immunodeficiency virus) test. STI (sexually transmitted infection) testing, if you are at risk. Lung cancer screening. Colorectal cancer screening. Prostate cancer screening. Abdominal aortic aneurysm (AAA) screening. You may need this if you are a current or former smoker. Talk with your health care provider about your test results, treatment options, and if necessary, the need for more tests. Follow these instructions at home: Eating and drinking  Eat a diet that includes fresh fruits and vegetables, whole grains, lean protein, and low-fat dairy products. Limit your intake of foods with high amounts of sugar, saturated fats, and salt. Take vitamin and mineral supplements as recommended by your health care provider. Do not drink alcohol if your health care provider tells you not to drink. If you drink alcohol: Limit how much you have to 0-2 drinks a day. Know how much alcohol is in your drink. In the [place] one drink equals one 12 oz bottle of beer (355 mL), one 5 oz glass of wine (148 mL), or one 1 oz glass of hard liquor (44 mL). Lifestyle Brush your teeth every morning and night with fluoride toothpaste. Floss one time each day. Exercise for at least 30 minutes 5 or more days each week. Do not use any products that contain nicotine or tobacco. These products include cigarettes, chewing tobacco, and vaping devices, such as e-cigarettes. If you need help quitting, ask your health care provider. Do not use drugs. If you are sexually active, practice safe sex. Use a condom or other form of protection to prevent STIs. Take aspirin  only as told by your health care provider. Make sure that you understand how much to take and what form to take. Work with your health care provider to find out whether it is safe  and beneficial for you to take aspirin  daily. Ask your health care provider if you need to take a cholesterol-lowering medicine (statin). Find healthy ways to manage stress, such as: Meditation, yoga, or listening to music. Journaling. Talking to a trusted person. Spending time with friends and family. Safety Always wear your seat belt while driving or riding in a vehicle. Do not drive: If you have been drinking alcohol. Do not ride with someone who has been drinking. When you are tired or distracted. While texting. If you have been using any mind-altering substances or drugs. Wear a helmet and other protective equipment during sports activities. If you have firearms in your house, make sure you follow all gun safety procedures. Minimize exposure to UV radiation to reduce your risk of skin cancer. What's next? Visit your health care provider once a year for an annual wellness visit. Ask your health care provider how often you should have your eyes and teeth checked. Stay up to date on all vaccines. This information is not intended to replace advice given to you by your health care provider. Make sure you discuss any questions you have with your health care provider. Document Revised: 05/27/2021 Document Reviewed: 05/27/2021 Elsevier Patient Education  2024 [employer].

## 2024-08-27 NOTE — Assessment & Plan Note (Signed)
 Follows with cardiology.  He is on Crestor  20 mg daily.

## 2024-08-27 NOTE — Progress Notes (Addendum)
 Subjective:   [name] is a 78 y.o. who presents for a Medicare Wellness preventive visit.  As a reminder, Annual Wellness Visits don't include a physical exam, and some assessments may be limited, especially if this visit is performed virtually. We may recommend an in-person follow-up visit with your provider if needed.  Visit Complete: In person    Persons Participating in Visit: Patient.  AWV Questionnaire: Yes: Patient Medicare AWV questionnaire was completed by the patient on 08/23/24; I have confirmed that all information answered by patient is correct and no changes since this date.  Cardiac Risk Factors include: advanced age (>18men, >76 women);dyslipidemia;diabetes mellitus;hypertension;male gender     Objective:    Today's Vitals   08/23/24 1039 08/27/24 0950  BP:  120/64  Pulse:  (!) 58  Temp:  98.4 F (36.9 C)  SpO2:  94%  Weight:  173 lb 6.4 oz (78.7 kg)  Height:  5' 10 (1.778 m)  PainSc: 6  6   PainLoc:  Generalized   Body mass index is 24.88 kg/m.     08/27/2024   10:03 AM 04/21/2023   10:09 AM 04/08/2022    9:46 AM 12/01/2020    1:54 PM 08/29/2020   10:21 AM 08/29/2017    7:19 AM 08/24/2017   10:07 AM  Advanced Directives  Does Patient Have a Medical Advance Directive? Yes Yes Yes Yes Yes Yes  Yes   Type of Estate agent of [place];Living will Healthcare Power of [place];Living will Healthcare Power of [employer] of [place];Living will Healthcare Power of [place];Living will Healthcare Power of [place];Living will Healthcare Power of [place];Living will  Does patient want to make changes to medical advance directive?    No - Patient declined  No - Patient declined    Copy of Healthcare Power of Attorney in Chart? No - copy requested No - copy requested No - copy requested  No - copy requested Yes  Yes      Data saved with a previous flowsheet row definition    Current Medications  (verified) Outpatient Encounter Medications as of 08/27/2024  Medication Sig   aspirin  EC 81 MG tablet Take 81 mg by mouth daily.   BOOSTRIX 5-2.5-18.5 LF-MCG/0.5 injection    cetirizine (ZYRTEC) 10 MG tablet Take 10 mg by mouth daily.   Cholecalciferol (VITAMIN D-3) 1000 UNITS CAPS Take 5,000 Units by mouth daily.    famotidine  (PEPCID ) 20 MG tablet Take 1 tablet (20 mg total) by mouth 2 (two) times daily.   finasteride (PROSCAR) 5 MG tablet Take 5 mg by mouth daily.   fluticasone  (FLONASE ) 50 MCG/ACT nasal spray USE 2 SPRAYS IN EACH  NOSTRIL DAILY   folic acid (FOLVITE) 1 MG tablet Take 1 mg by mouth daily.   hydrochlorothiazide  (HYDRODIURIL ) 25 MG tablet 1 tablet in the morning   meloxicam  (MOBIC ) 7.5 MG tablet Take 7.5 mg by mouth daily.   metFORMIN  (GLUCOPHAGE -XR) 750 MG 24 hr tablet Take 1 tablet (750 mg total) by mouth 2 (two) times daily.   methotrexate (RHEUMATREX) 2.5 MG tablet Take 15 mg by mouth once a week. Caution:Chemotherapy. Protect from light.   metoprolol  tartrate (LOPRESSOR ) 25 MG tablet Take 0.5 tablets (12.5 mg total) by mouth 2 (two) times daily.   nitroGLYCERIN  (NITROSTAT ) 0.4 MG SL tablet Place 1 tablet under the tongue as needed. Every 5 minutes for chest pain   Probiotic Product (PRO-BIOTIC BLEND PO) Take 1 tablet by mouth daily.   rosuvastatin  (  CRESTOR ) 20 MG tablet TAKE 1 TABLET BY MOUTH EVERY DAY   SPIKEVAX syringe    tamsulosin  (FLOMAX ) 0.4 MG CAPS capsule Take 0.4 mg by mouth as needed.   traMADol (ULTRAM) 50 MG tablet Take 50 mg by mouth every 6 (six) hours as needed for moderate pain.   traZODone  (DESYREL ) 50 MG tablet Take 1 tablet (50 mg total) by mouth at bedtime.   No facility-administered encounter medications on file as of 08/27/2024.    Allergies (verified) Penicillins, Contrast media [iodinated contrast media], Sulfamethoxazole, Ibuprofen, and Atorvastatin  calcium    History: Past Medical History:  Diagnosis Date   Allergic rhinitis     Arthritis    CAD (coronary artery disease)    a. 11/11/15 - OOH VF arrest 2/2 ant STEMI >> CPR//AED shock x 2 >> ROSC >> LHC: pLAD 100 (Promus DES), mLCx 50, mRCA 30, EF 25% [managed with hypothermia protocol]   Fractured rib    GERD (gastroesophageal reflux disease)    History of Clostridium difficile colitis 10/2015   post MI >> required IV Flagyl  and IV Vanc >> DC on po Vanc   History of kidney stones    History of lithotripsy    HLD (hyperlipidemia)    Hypertension    Impaired glucose tolerance    Insomnia    Ischemic cardiomyopathy    a. EF at [facility] 11/11/15 25%;  b. Echo 11/14/15: EF 50-55%, dist ant and anterior septal HK   Nephrolithiasis 1968   Pelvic fracture ([name]) 09/2015   Pre-diabetes    Rheumatoid arthritis(714.0) 1998   ST elevation (STEMI) myocardial infarction involving left anterior descending coronary artery ([name]) 11/11/15   100% LAD, Cardiogenic Shock. -- EF ~20-25%   Past Surgical History:  Procedure Laterality Date   CARDIAC CATHETERIZATION N/A 11/11/2015   Procedure: Left Heart Cath and Cors/Grafts Angiography;  Surgeon: Ozell Fell, MD;  Location: [facility] INVASIVE CV LAB;  Service: Cardiovascular;  Laterality: N/A;   CARDIAC CATHETERIZATION N/A 11/11/2015   Procedure: Coronary Stent Intervention;  Surgeon: Ozell Fell, MD;  Location: [facility] INVASIVE CV LAB;  Service: Cardiovascular;  Laterality: N/A;  prox lad 3.5x38 promus   CORONARY ANGIOPLASTY WITH STENT PLACEMENT  2016   EXTRACORPOREAL SHOCK WAVE LITHOTRIPSY Left 08/29/2017   Procedure: LEFT EXTRACORPOREAL SHOCK WAVE LITHOTRIPSY (ESWL);  Surgeon: Renda Glance, MD;  Location: [facility] ORS;  Service: Urology;  Laterality: Left;   status post multiple lithotripy and urological surgery for reccurent calcium  oxalate stones     Family History  Problem Relation Age of Onset   Cancer Mother        pancreatic ; pulmonary embolism    Diabetes Father    Social History   Socioeconomic History   Marital status: Married     Spouse name: Not on file   Number of children: Not on file   Years of education: Not on file   Highest education level: Associate degree: academic program  Occupational History   Occupation: retired  Tobacco Use   Smoking status: Former   Smokeless tobacco: Never   Tobacco comments:    quit 30 yrs ago  Vaping Use   Vaping status: Never Used  Substance and Sexual Activity   Alcohol use: No   Drug use: No   Sexual activity: Not on file  Other Topics Concern   Not on file  Social History Narrative   Not on file   Social Drivers of Health   Financial Resource Strain: Low Risk  (08/23/2024)   Overall  Financial Resource Strain (CARDIA)    Difficulty of Paying Living Expenses: Not hard at all  Food Insecurity: No Food Insecurity (08/23/2024)   Hunger Vital Sign    Worried About Running Out of Food in the Last Year: Never true    Ran Out of Food in the Last Year: Never true  Transportation Needs: No Transportation Needs (08/23/2024)   PRAPARE - Administrator, Civil Service (Medical): No    Lack of Transportation (Non-Medical): No  Physical Activity: Sufficiently Active (08/23/2024)   Exercise Vital Sign    Days of Exercise per Week: 3 days    Minutes of Exercise per Session: 50 min  Stress: No Stress Concern Present (08/23/2024)   [employer] of Occupational Health - Occupational Stress Questionnaire    Feeling of Stress: Not at all  Social Connections: Socially Isolated (08/23/2024)   Social Connection and Isolation Panel    Frequency of Communication with Friends and Family: Once a week    Frequency of Social Gatherings with Friends and Family: Once a week    Attends Religious Services: Never    Database administrator or Organizations: No    Attends Engineer, structural: Not on file    Marital Status: Married    Tobacco Counseling Counseling given: Not Answered Tobacco comments: quit 30 yrs ago    Clinical Intake:  Pre-visit preparation  completed: Yes  Pain : 0-10 Pain Score: 6  Pain Type: Chronic pain Pain Location: Generalized Pain Descriptors / Indicators: Aching, Sore Pain Onset: Yesterday Pain Frequency: Other (Comment) (cut a tree down arthritits flare up)     BMI - recorded: 24.88 Nutritional Status: BMI of 19-24  Normal Nutritional Risks: None Diabetes: Yes CBG done?: No Did pt. bring in CBG monitor from home?: No  Lab Results  Component Value Date   HGBA1C 7.9 (A) 02/23/2024   HGBA1C 7.3 (A) 08/25/2023   HGBA1C 7.7 (A) 02/21/2023     How often do you need to have someone help you when you read instructions, pamphlets, or other written materials from your doctor or pharmacy?: 1 - Never  Interpreter Needed?: No  Information entered by :: Ellouise Haws, LPN   Activities of Daily Living     08/23/2024   10:39 AM  In your present state of health, do you have any difficulty performing the following activities:  Hearing? 0  Vision? 0  Difficulty concentrating or making decisions? 0  Walking or climbing stairs? 0  Dressing or bathing? 0  Doing errands, shopping? 0  Preparing Food and eating ? N  Using the Toilet? N  In the past six months, have you accidently leaked urine? N  Do you have problems with loss of bowel control? N  Managing your Medications? N  Managing your Finances? N  Housekeeping or managing your Housekeeping? N    Patient Care Team: Kennyth Worth HERO, MD as PCP - General (Family Medicine) Wonda Sharper, MD as PCP - Cardiology (Cardiology) Abigail Maude POUR [facility])  I have updated your Care Teams any recent Medical Services you may have received from other providers in the past year.     Assessment:   This is a routine wellness examination for Teaghan.  Hearing/Vision screen Hearing Screening - Comments:: Pt denies any hearing issues  Vision Screening - Comments:: Wears rx glasses - up to date with routine eye exams with Dr Abigail    Goals Addressed  This Visit's Progress    Patient Stated       Maintain health and activity        Depression Screen     08/27/2024   10:03 AM 02/23/2024   11:08 AM 08/25/2023   10:19 AM 04/21/2023   10:09 AM 02/21/2023   10:03 AM 08/23/2022   10:16 AM 08/23/2022   10:15 AM  PHQ 2/9 Scores  PHQ - 2 Score 0 0 0 0 0 0 0    Fall Risk     08/23/2024   10:39 AM 02/23/2024   11:08 AM 08/25/2023   10:19 AM 04/19/2023   12:10 PM 02/21/2023   10:03 AM  Fall Risk   Falls in the past year? 0 0 0 0 0  Number falls in past yr: 0  0  0  Injury with Fall? 0 0 0 0   Risk for fall due to : No Fall Risks No Fall Risks No Fall Risks Impaired vision No Fall Risks  Follow up Falls prevention discussed   Falls prevention discussed     MEDICARE RISK AT HOME:  Medicare Risk at Home Any stairs in or around the home?: (Patient-Rptd) Yes If so, are there any without handrails?: (Patient-Rptd) No Home free of loose throw rugs in walkways, pet beds, electrical cords, etc?: (Patient-Rptd) Yes Adequate lighting in your home to reduce risk of falls?: (Patient-Rptd) Yes Life alert?: (Patient-Rptd) No Use of a cane, walker or w/c?: (Patient-Rptd) No Grab bars in the bathroom?: (Patient-Rptd) No Shower chair or bench in shower?: (Patient-Rptd) No Elevated toilet seat or a handicapped toilet?: (Patient-Rptd) No  TIMED UP AND GO:  Was the test performed?  Yes  Length of time to ambulate 10 feet: 10 sec Gait steady and fast without use of assistive device  Cognitive Function: 6CIT completed        08/27/2024   10:05 AM 04/21/2023   10:11 AM 04/08/2022    9:47 AM 08/29/2020   10:26 AM  6CIT Screen  What Year? 0 points 0 points 0 points 0 points  What month? 0 points 0 points 0 points 0 points  What time? 0 points 0 points 0 points   Count back from 20 0 points 0 points 0 points 0 points  Months in reverse 0 points 0 points 4 points 0 points  Repeat phrase 0 points 0 points 2 points 2 points  Total Score 0 points 0 points  6 points     Immunizations Immunization History  Administered Date(s) Administered   Fluad Quad(high Dose 65+) 08/27/2019, 08/19/2021, 08/23/2022   Fluad Trivalent(High Dose 65+) 08/25/2023   INFLUENZA, HIGH DOSE SEASONAL PF 10/03/2015, 09/09/2017, 09/18/2018   Influenza Split 09/26/2013   Influenza Whole 09/23/2009, 09/02/2010   Influenza-Unspecified 10/13/2014, 09/23/2016, 09/09/2017, 09/12/2018, 08/24/2024   PFIZER(Purple Top)SARS-COV-2 Vaccination 01/17/2020, 02/12/2020, 08/06/2020   PNEUMOCOCCAL CONJUGATE-20 06/17/2021   Pfizer Covid-19 Vaccine Bivalent Booster 20yrs & up 11/19/2021, 09/19/2022   Pfizer(Comirnaty)Fall Seasonal Vaccine 12 years and older 09/17/2022, 02/25/2023, 09/07/2023   Pneumococcal Conjugate-13 10/03/2015, 06/12/2021   Pneumococcal Polysaccharide-23 12/25/2013   Respiratory Syncytial Virus Vaccine,Recomb Aduvanted(Arexvy) 09/28/2022   Tdap 12/25/2013   Zoster Recombinant(Shingrix) 03/03/2021, 06/12/2021    Screening Tests Health Maintenance  Topic Date Due   FOOT EXAM  08/19/2022   DTaP/Tdap/Td (2 - Td or Tdap) 12/26/2023   COVID-19 Vaccine (8 - Pfizer risk 2024-25 season) 08/13/2024   HEMOGLOBIN A1C  08/25/2024   OPHTHALMOLOGY EXAM  08/25/2024   Diabetic kidney evaluation -  Urine ACR  02/22/2025   Diabetic kidney evaluation - eGFR measurement  05/24/2025   Medicare Annual Wellness (AWV)  08/27/2025   Pneumococcal Vaccine: 50+ Years  Completed   Influenza Vaccine  Completed   Hepatitis C Screening  Completed   Zoster Vaccines- Shingrix  Completed   HPV VACCINES  Aged Out   Meningococcal B Vaccine  Aged Out   Fecal DNA (Cologuard)  Discontinued    Health Maintenance Items Addressed: See Nurse Notes at the end of this note, Pt is due for foot exam at this appt also has an appt with Dr Abigail ophthalmology on 08/28/24 for diabetic eye exams made aware of Dtap/TD due   Additional Screening:  Vision Screening: Recommended annual ophthalmology exams  for early detection of glaucoma and other disorders of the eye. Is the patient up to date with their annual eye exam?  Yes  Who is the provider or what is the name of the office in which the patient attends annual eye exams? Dr Abigail  Dental Screening: Recommended annual dental exams for proper oral hygiene  Community Resource Referral / Chronic Care Management: CRR required this visit?  No   CCM required this visit?  No   Plan:    I have personally reviewed and noted the following in the patient's chart:   Medical and social history Use of alcohol, tobacco or illicit drugs  Current medications and supplements including opioid prescriptions. Patient is currently taking opioid prescriptions. Information provided to patient regarding non-opioid alternatives. Patient advised to discuss non-opioid treatment plan with their provider. Functional ability and status Nutritional status Physical activity Advanced directives List of other physicians Hospitalizations, surgeries, and ER visits in previous 12 months Vitals Screenings to include cognitive, depression, and falls Referrals and appointments  In addition, I have reviewed and discussed with patient certain preventive protocols, quality metrics, and best practice recommendations. A written personalized care plan for preventive services as well as general preventive health recommendations were provided to patient.   Ellouise VEAR Haws, LPN   [id number]   After Visit Summary: (MyChart) Due to this being a telephonic visit, the after visit summary with patients personalized plan was offered to patient via MyChart   Notes: PCP Follow Up Recommendations: Foot exam and A1C due in health maintenance

## 2024-08-28 DIAGNOSIS — H40013 Open angle with borderline findings, low risk, bilateral: Secondary | ICD-10-CM | POA: Diagnosis not present

## 2024-08-28 LAB — HM DIABETES EYE EXAM

## 2024-08-30 ENCOUNTER — Telehealth: Payer: Self-pay | Admitting: *Deleted

## 2024-08-30 ENCOUNTER — Other Ambulatory Visit (HOSPITAL_COMMUNITY): Payer: Self-pay

## 2024-08-30 ENCOUNTER — Telehealth: Payer: Self-pay

## 2024-08-30 NOTE — Telephone Encounter (Signed)
 Copied from CRM [id number]. Topic: Clinical - Medication Prior Auth >> Aug 30, 2024 10:03 AM Franky GRADE wrote: Reason for CRM: Patient is calling because his pharmacy advised that tirzepatide  (MOUNJARO ) 2.5 MG/0.5ML Pen [[id number]] & tirzepatide  (MOUNJARO ) 5 MG/0.5ML Pen [[id number]] require prior auth. Please call patient once the PA process has started.   Please start PA [facility]

## 2024-08-30 NOTE — Telephone Encounter (Signed)
 Pharmacy Patient Advocate Encounter   Received notification from Physician's Office that prior authorization for Mounjaro  2.5MG /0.5ML auto-injectors  is required/requested.   Insurance verification completed.   The patient is insured through [employer] ADVANTAGE/RX ADVANCE .   Per test claim: PA required; PA submitted to above mentioned insurance via Latent Key/confirmation #/EOC [id number] Status is pending

## 2024-08-30 NOTE — Telephone Encounter (Signed)
 Copied from CRM [id number]. Topic: Clinical - Medication Prior Auth >> Aug 30, 2024 10:03 AM Franky GRADE wrote: Reason for CRM: Patient is calling because his pharmacy advised that tirzepatide  (MOUNJARO ) 2.5 MG/0.5ML Pen [[id number]] & tirzepatide  (MOUNJARO ) 5 MG/0.5ML Pen [[id number]] require prior auth. Please call patient once the PA process has started.   Please start PA  [facility]    Patient aware Rx approved by [employer]

## 2024-08-31 ENCOUNTER — Other Ambulatory Visit (HOSPITAL_COMMUNITY): Payer: Self-pay

## 2024-08-31 NOTE — Telephone Encounter (Signed)
 PA request has been Approved. New Encounter has been or will be created for follow up. For additional info see Pharmacy Prior Auth telephone encounter from 08/30/24.

## 2024-08-31 NOTE — Telephone Encounter (Signed)
 Patient notified

## 2024-08-31 NOTE — Telephone Encounter (Signed)
 Pharmacy Patient Advocate Encounter  Received notification from [facility] ADVANTAGE/RX ADVANCE that Prior Authorization for Mounjaro  2.5MG /0.5ML auto-injectors  has been APPROVED from 08/30/24 to 08/30/25   PA #/Case ID/Reference #: [id number]

## 2024-08-31 NOTE — Telephone Encounter (Signed)
 Duplicated

## 2024-09-03 ENCOUNTER — Other Ambulatory Visit (HOSPITAL_COMMUNITY): Payer: Self-pay

## 2024-09-21 ENCOUNTER — Other Ambulatory Visit: Payer: Self-pay | Admitting: *Deleted

## 2024-09-28 ENCOUNTER — Encounter: Payer: Self-pay | Admitting: Family Medicine

## 2024-09-28 NOTE — Telephone Encounter (Signed)
 It is a good idea for him to increase his fiber and water  intake.  He can try using an over-the-counter fiber supplement such as Benefiber or Metamucil.  If this does not alleviate his symptoms then the next step would be to take a small daily dose of MiraLAX

## 2024-09-28 NOTE — Telephone Encounter (Signed)
 See note

## 2024-11-13 ENCOUNTER — Encounter: Payer: Self-pay | Admitting: Family Medicine

## 2024-11-14 ENCOUNTER — Ambulatory Visit: Payer: Self-pay

## 2024-11-14 NOTE — Telephone Encounter (Signed)
 Per Dr. Kennyth, can we please have patient schedule an appointment. Please and thank you.

## 2024-11-14 NOTE — Telephone Encounter (Signed)
 FYI Only or Action Required?: FYI only for provider: appointment scheduled on 12.5.25.  Patient was last seen in primary care on 08/27/2024 by Kennyth Worth HERO, MD.  Called Nurse Triage reporting Back Pain.  Symptoms began several weeks ago.  Interventions attempted: OTC medications: tylenol  and Prescription medications: mobic .  Symptoms are: gradually improving.  Triage Disposition: See PCP When Office is Open (Within 3 Days)  Patient/caregiver understands and will follow disposition?: Yes       Copied from CRM [id number]. Topic: Clinical - Red Word Triage >> Nov 14, 2024 11:15 AM Carlyon D wrote: Red Word that prompted transfer to Nurse Triage: constant back pain, pcp messaged pt to schedule appt with him. Reason for Disposition  [1] MODERATE back pain (e.g., interferes with normal activities) AND [2] present > 3 days  Answer Assessment - Initial Assessment Questions Message sent to PCP, responded for pt to make an appt. Back pain x 1.5 weeks, from constant repetitive  motion, twisting with leaf blower back pain on. Pt states doing better today. Pain on right lower back, radiates down right leg and into right testicle (message did report this). States he felt like his underwear were too tight causing increased pain in that area but better today. Also mentioned cyst on spermicidal duct. Also he states with the mounjaro  he thought it was constipation, but what it is, is hard to start the BM. Feels like pushing a 3inch peg through 1 inch hole so he isn't sure if straining has also made things worse. RN did give call back instructions especially in regards to the testicles. RN did also give care advise and provide education.     1. ONSET: When did the pain begin? (e.g., minutes, hours, days)     1.5 weeks ago 2. LOCATION: Where does it hurt? (upper, mid or lower back)     Right lower back 3. SEVERITY: How bad is the pain?  (e.g., Scale 1-10; mild, moderate, or severe)     Currently  5/10 as high as 9.5/10 4. PATTERN: Is the pain constant? (e.g., yes, no; constant, intermittent)      Constant worse with certain movements 5. RADIATION: Does the pain shoot into your legs or somewhere else?     Right groin and leg 6. CAUSE:  What do you think is causing the back pain?      Twisting with leaf blower back pack 7. BACK OVERUSE:  Any recent lifting of heavy objects, strenuous work or exercise?     yes 8. MEDICINES: What have you taken so far for the pain? (e.g., nothing, acetaminophen , NSAIDS)     Mobic , tylenol  9. NEUROLOGIC SYMPTOMS: Do you have any weakness, numbness, or problems with bowel/bladder control?     denies 10. OTHER SYMPTOMS: Do you have any other symptoms? (e.g., fever, abdomen pain, burning with urination, blood in urine)       denies  Protocols used: Back Pain-A-AH

## 2024-11-14 NOTE — Telephone Encounter (Signed)
 Pt is scheduled for 11-16-24

## 2024-11-14 NOTE — Telephone Encounter (Signed)
 Pt scheduled

## 2024-11-14 NOTE — Telephone Encounter (Signed)
 Please advise on if patient needs to come in to be seen or what treatment plan is appropriate. Please and thank you

## 2024-11-14 NOTE — Telephone Encounter (Signed)
 He can come in here first.   Worth HERO. Kennyth, MD 11/14/2024 10:40 AM

## 2024-11-15 ENCOUNTER — Ambulatory Visit: Payer: Self-pay

## 2024-11-15 NOTE — Telephone Encounter (Signed)
 Pt requesting to switch appt to later in day d/t snow that his area is potentially getting. No appts available with PCP until 12/8.   Copied from CRM #[id number]. Topic: Appointments - Appointment Cancel/Reschedule >> Nov 15, 2024  8:44 AM Suzen RAMAN wrote: Patient/patient representative is calling to cancel or reschedule an appointment due to possible weather; was triage yesterday for constant back pain(appt made by NT)

## 2024-11-16 ENCOUNTER — Ambulatory Visit: Admitting: Family Medicine

## 2024-11-16 ENCOUNTER — Encounter: Payer: Self-pay | Admitting: Family Medicine

## 2024-11-16 VITALS — BP 130/80 | HR 98 | Temp 97.2°F | Ht 70.0 in | Wt 169.4 lb

## 2024-11-16 DIAGNOSIS — E1151 Type 2 diabetes mellitus with diabetic peripheral angiopathy without gangrene: Secondary | ICD-10-CM

## 2024-11-16 DIAGNOSIS — E1159 Type 2 diabetes mellitus with other circulatory complications: Secondary | ICD-10-CM

## 2024-11-16 DIAGNOSIS — M5430 Sciatica, unspecified side: Secondary | ICD-10-CM

## 2024-11-16 MED ORDER — PREDNISONE 20 MG PO TABS: 20.0000 mg | ORAL_TABLET | Freq: Every day | ORAL | 0 refills | Status: DC

## 2024-11-16 MED ORDER — BACLOFEN 10 MG PO TABS: 10.0000 mg | ORAL_TABLET | Freq: Three times a day (TID) | ORAL | 0 refills | Status: AC

## 2024-11-16 NOTE — Patient Instructions (Addendum)
 It was very nice to see you today!  VISIT SUMMARY: During your visit, we discussed your worsening back and leg pain, testicular swelling, and constipation. We have developed a plan to address these issues and will follow up in 10 days to reassess your symptoms.  YOUR PLAN: SCIATICA WITH LOWER BACK PAIN: This is likely due to a pinched nerve from arthritis, muscular strain, bone spur, or herniated disc. -Take prednisone  for 5-7 days to reduce inflammation. -Use a muscle relaxer for short-term relief. -You will be referred to sports medicine for further evaluation and management. -Schedule a follow-up appointment in 10 days to reassess your symptoms.  CONSTIPATION: You are experiencing constipation, which you manage with dietary fiber, hydration, and Senokot S. -Continue your dietary fiber intake and stay hydrated. -Use Senokot S as needed. -Monitor your bowel movement frequency and consistency.  Return if symptoms worsen or fail to improve.   Take care, Dr Kennyth  PLEASE NOTE:  If you had any lab tests, please let us  know if you have not heard back within a few days. You may see your results on mychart before we have a chance to review them but we will give you a call once they are reviewed by us .   If we ordered any referrals today, please let us  know if you have not heard from their office within the next week.   If you had any urgent prescriptions sent in today, please check with the pharmacy within an hour of our visit to make sure the prescription was transmitted appropriately.   Please try these tips to maintain a healthy lifestyle:  Eat at least 3 REAL meals and 1-2 snacks per day.  Aim for no more than 5 hours between eating.  If you eat breakfast, please do so within one hour of getting up.   Each meal should contain half fruits/vegetables, one quarter protein, and one quarter carbs (no bigger than a computer mouse)  Cut down on sweet beverages. This includes juice, soda,  and sweet tea.   Drink at least 1 glass of water  with each meal and aim for at least 8 glasses per day  Exercise at least 150 minutes every week.

## 2024-11-16 NOTE — Assessment & Plan Note (Signed)
 Blood pressure at goal today on HCTZ 25 mg daily, Toprol  tartrate 12.5 mg twice daily.

## 2024-11-16 NOTE — Assessment & Plan Note (Signed)
 He is doing well with Mounjaro  5 mg weekly though is having some issues with constipation.  He made some adjustments with intake of fiber and water  and symptoms do seem to be improving.  He will come back in a few weeks and we can recheck A1c at that time.

## 2024-11-16 NOTE — Assessment & Plan Note (Signed)
 Exam and history are consistent with sciatica flare.  This has been a recurrent issue for him for many years however his last major flareup was a few years ago.  Will treat his short-term pain with prednisone  and baclofen  though given recurrent nature will place referral to sports medicine for further evaluation and management.  We did discuss referral to PT and imaging however will defer this to sports medicine.

## 2024-11-16 NOTE — Progress Notes (Signed)
   [name] is a 78 y.o. male who presents today for an office visit.  Assessment/Plan:  Chronic Problems Addressed Today: Sciatica Exam and history are consistent with sciatica flare.  This has been a recurrent issue for him for many years however his last major flareup was a few years ago.  Will treat his short-term pain with prednisone  and baclofen  though given recurrent nature will place referral to sports medicine for further evaluation and management.  We did discuss referral to PT and imaging however will defer this to sports medicine.  DM (diabetes mellitus), type 2 with peripheral vascular complications ([name]) He is doing well with Mounjaro  5 mg weekly though is having some issues with constipation.  He made some adjustments with intake of fiber and water  and symptoms do seem to be improving.  He will come back in a few weeks and we can recheck A1c at that time.  Hypertension associated with diabetes ([name]) Blood pressure at goal today on HCTZ 25 mg daily, Toprol  tartrate 12.5 mg twice daily.     Subjective:  HPI:  See assessment / plan for status of chronic conditions.   Discussed the use of AI scribe software for clinical note transcription with the patient, who gave verbal consent to proceed.  History of Present Illness [name] [name] is a 78 year old male who presents with back and leg pain.  He has been experiencing worsening back and leg pain over the past two weeks. He reports back pain that sometimes extends toward his leg. 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 The pain is severe enough to interfere with daily activities, such as dressing.  Pain is consistent with previous sciatica flares.  He experiences constipation, which he attributes to his diet and hydration levels. Consuming lighter meals and staying hydrated helps alleviate the  issue. He manages his bowel movements with fiber supplements and dates, and occasionally uses Senokot S for relief. He is currently on Mounjaro  for diabetes management and has been on it for five weeks. Initially, he experienced constipation as a side effect, but it has improved over time.         Objective:  Physical Exam: BP 130/80   Pulse 98   Temp (!) 97.2 F (36.2 C) (Temporal)   Ht 5' 10 (1.778 m)   Wt 169 lb 6.4 oz (76.8 kg)   SpO2 95%   BMI 24.31 kg/m   Gen: No acute distress, resting comfortably MUSCULOSKELETAL: - Back: No deformities.  Tender palpation along right lower lumbar paraspinal muscle groups.  Straight leg raise positive.  Neurovascular intact distally. Neuro: Grossly normal, moves all extremities Psych: Normal affect and thought content      Sarh Kirschenbaum M. Kennyth, MD 11/16/2024 9:56 AM

## 2024-11-26 ENCOUNTER — Encounter: Payer: Self-pay | Admitting: Family Medicine

## 2024-11-26 ENCOUNTER — Ambulatory Visit: Admitting: Family Medicine

## 2024-11-26 VITALS — BP 120/68 | HR 62 | Temp 97.9°F | Ht 70.0 in | Wt 169.0 lb

## 2024-11-26 DIAGNOSIS — E1159 Type 2 diabetes mellitus with other circulatory complications: Secondary | ICD-10-CM | POA: Diagnosis not present

## 2024-11-26 DIAGNOSIS — E1151 Type 2 diabetes mellitus with diabetic peripheral angiopathy without gangrene: Secondary | ICD-10-CM

## 2024-11-26 DIAGNOSIS — Z7985 Long-term (current) use of injectable non-insulin antidiabetic drugs: Secondary | ICD-10-CM | POA: Diagnosis not present

## 2024-11-26 DIAGNOSIS — M5430 Sciatica, unspecified side: Secondary | ICD-10-CM | POA: Diagnosis not present

## 2024-11-26 DIAGNOSIS — E1165 Type 2 diabetes mellitus with hyperglycemia: Secondary | ICD-10-CM | POA: Diagnosis not present

## 2024-11-26 DIAGNOSIS — I152 Hypertension secondary to endocrine disorders: Secondary | ICD-10-CM | POA: Diagnosis not present

## 2024-11-26 LAB — POCT GLYCOSYLATED HEMOGLOBIN (HGB A1C): Hemoglobin A1C: 8.7 % — AB (ref 4.0–5.6)

## 2024-11-26 MED ORDER — TIRZEPATIDE 7.5 MG/0.5ML ~~LOC~~ SOAJ: 7.5000 mg | SUBCUTANEOUS | 0 refills | Status: AC

## 2024-11-26 NOTE — Progress Notes (Signed)
° °  [name] is a 78 y.o. male who presents today for an office visit.  Assessment/Plan:  Chronic Problems Addressed Today: Type 2 diabetes mellitus with hyperglycemia ([name]) A1C modestly improved 8.7 though did recently complete course of prednisone  which due to cause mild elevation in glucose.  Will increase his Mounjaro  to 7.5 mg weekly.  Hopefully he will not need any further steroid burst in the near future either given that his sciatica is improving.  He will recheck A1c here in 3 months and we can adjust medications as needed.  Hypertension associated with diabetes ([name]) Blood pressure at goal today on HCTZ 25 mg daily and metoprolol  tartrate 12.5 mg twice daily.  Sciatica I saw him for this a week ago.  Gave him prednisone  burst which did work well.  So does have occasional low back pain but this is improving.  He will be seeing sports medicine later this week.     Subjective:  HPI:  See assessment / plan for status of chronic conditions.   Discussed the use of AI scribe software for clinical note transcription with the patient, who gave verbal consent to proceed.  History of Present Illness [name] is a 78 year old male with diabetes who presents for follow-up on his A1c levels and sciatica management.  He has been on Mounjaro  for approximately 12 weeks, starting with a glucose level of 210 mg/dL. Recently, his glucose level was recorded at 389 mg/dL, which he attributes to prednisone  use for sciatica. His A1c has improved slightly. He has four weeks left on his current Mounjaro  dose of 5 mg. He has lost approximately four pounds since starting Mounjaro , although his primary goal is to manage his blood sugar rather than weight loss.  He experiences ongoing issues with sciatica, which he manages by being cautious with movements, particularly bending. Symptoms improve in the morning after rest but worsen as the day progresses, with soreness by early  afternoon. He is scheduled to see Dr. Joane for further evaluation         Objective:  Physical Exam: BP 120/68   Pulse 62   Temp 97.9 F (36.6 C) (Temporal)   Ht 5' 10 (1.778 m)   Wt 169 lb (76.7 kg)   SpO2 98%   BMI 24.25 kg/m   Wt Readings from Last 3 Encounters:  11/26/24 169 lb (76.7 kg)  11/16/24 169 lb 6.4 oz (76.8 kg)  08/27/24 173 lb 3.2 oz (78.6 kg)  Gen: No acute distress, resting comfortably CV: Regular rate and rhythm with no murmurs appreciated Pulm: Normal work of breathing, clear to auscultation bilaterally with no crackles, wheezes, or rhonchi Neuro: Grossly normal, moves all extremities Psych: Normal affect and thought content      Robert Tuckett M. Kennyth, MD 11/26/2024 11:13 AM

## 2024-11-26 NOTE — Assessment & Plan Note (Signed)
 Blood pressure at goal today on HCTZ 25 mg daily and metoprolol  tartrate 12.5 mg twice daily.

## 2024-11-26 NOTE — Assessment & Plan Note (Addendum)
 I saw him for this a week ago.  Gave him prednisone  burst which did work well.  So does have occasional low back pain but this is improving.  He will be seeing sports medicine later this week.

## 2024-11-26 NOTE — Assessment & Plan Note (Signed)
 A1C modestly improved 8.7 though did recently complete course of prednisone  which due to cause mild elevation in glucose.  Will increase his Mounjaro  to 7.5 mg weekly.  Hopefully he will not need any further steroid burst in the near future either given that his sciatica is improving.  He will recheck A1c here in 3 months and we can adjust medications as needed.

## 2024-11-26 NOTE — Patient Instructions (Signed)
 It was very nice to see you today!  VISIT SUMMARY: Today, we discussed your diabetes management and sciatica symptoms. We made adjustments to your diabetes medication and reviewed your sciatica treatment plan.  YOUR PLAN: TYPE 2 DIABETES MELLITUS WITH HYPERGLYCEMIA: Your blood sugar levels have improved slightly, but your A1c is still above the target. This may be due to your recent use of prednisone . -Increase your Mounjaro  dose to 7.5 mg weekly. Continue your current supply until the new prescription is filled. -Recheck your A1c in 3 months.  SCIATICA: Your sciatica symptoms have improved but still persist, especially with certain movements and as the day progresses. -Continue to follow up with Dr. Joane for further evaluation and management. -Consider imaging studies such as x-ray or ultrasound as recommended by Dr. Joane.  Return in about 3 months (around 02/24/2025).   Take care, Dr Kennyth  PLEASE NOTE:  If you had any lab tests, please let us  know if you have not heard back within a few days. You may see your results on mychart before we have a chance to review them but we will give you a call once they are reviewed by us .   If we ordered any referrals today, please let us  know if you have not heard from their office within the next week.   If you had any urgent prescriptions sent in today, please check with the pharmacy within an hour of our visit to make sure the prescription was transmitted appropriately.   Please try these tips to maintain a healthy lifestyle:  Eat at least 3 REAL meals and 1-2 snacks per day.  Aim for no more than 5 hours between eating.  If you eat breakfast, please do so within one hour of getting up.   Each meal should contain half fruits/vegetables, one quarter protein, and one quarter carbs (no bigger than a computer mouse)  Cut down on sweet beverages. This includes juice, soda, and sweet tea.   Drink at least 1 glass of water  with each meal and aim  for at least 8 glasses per day  Exercise at least 150 minutes every week.

## 2024-11-27 ENCOUNTER — Ambulatory Visit: Admitting: Family Medicine

## 2024-11-28 ENCOUNTER — Ambulatory Visit: Admitting: Family Medicine

## 2024-11-28 ENCOUNTER — Ambulatory Visit

## 2024-11-28 VITALS — BP 128/60 | HR 99 | Ht 70.0 in | Wt 168.0 lb

## 2024-11-28 DIAGNOSIS — M25551 Pain in right hip: Secondary | ICD-10-CM

## 2024-11-28 DIAGNOSIS — M545 Low back pain, unspecified: Secondary | ICD-10-CM | POA: Diagnosis not present

## 2024-11-28 DIAGNOSIS — G8929 Other chronic pain: Secondary | ICD-10-CM | POA: Diagnosis not present

## 2024-11-28 NOTE — Patient Instructions (Addendum)
 Thank you for coming in today.   Please get an Xray today before you leave   A referral for physical therapy has been submitted. A representative from the physical therapy office will contact you to coordinate scheduling after confirming your benefits with your insurance provider. If you do not hear from the physical therapy office within the next 1-2 weeks, please let us  know.    See you back as needed.

## 2024-11-28 NOTE — Progress Notes (Unsigned)
° °  I, Claretha Schimke am a scribe for Dr. Artist Lloyd, MD.  [name] is a 78 y.o. male who presents to [facility] at [facility] today for back pain that's been ongoing for many years intermittently, current flare over the last month. Pt locates pain to low back pain. Pain lasted for two weeks. Had one day that the pain went all the way down to his feet. What spear headed this visit is the right testicles swelled up along with the low back pain. He states that he is ok today because the pain has been getting progressively better.  Radiating pain:yes down to the knee, across the back LE numbness/tingling:no LE weakness: just knee when it is flared  Aggravates: bending over to grab something Treatments tried: prednisone , baclofen   Pertinent review of systems: No fevers or chills  Relevant historical information: Hypertension and diabetes.  History of kidney stone.   Exam:  BP 128/60   Pulse 99   Ht 5' 10 (1.778 m)   Wt 168 lb (76.2 kg)   SpO2 97%   BMI 24.11 kg/m  General: Well Developed, well nourished, and in no acute distress.   MSK: L-spine: Normal-appearing decreased range of motion.  Intact strength.  Reflexes are intact.  Right hip normal appearing normal motion some pain with flexion.  Intact strength.    Lab and Radiology Results  X-ray images lumbar spine and right hip obtained today personally and independently interpreted.  Lumbar spine: DDD L5-S1 and L4-L5.  No acute fractures are visible.  Right hip: No acute fractures.  Osteophyte present at the iliac crest.  Minimal right hip DJD.*  Await formal radiology review     Assessment and Plan: 78 y.o. male with right low back pain and right anterior hip pain.  There may have been some component of lumbar radiculopathy but the majority the pain is more axial back pain due to degenerative changes and muscle dysfunction.  Plan for physical therapy. Check back as needed if worsening  consider advanced imaging.  PDMP not reviewed this encounter. Orders Placed This Encounter  Procedures   DG Lumbar Spine 2-3 Views    Standing Status:   Future    Number of Occurrences:   1    Expiration Date:   12/29/2024    Reason for Exam (SYMPTOM  OR DIAGNOSIS REQUIRED):   low back pain    Preferred imaging location?:   [facility]   DG HIP UNILAT W OR W/O PELVIS 2-3 VIEWS RIGHT    Standing Status:   Future    Number of Occurrences:   1    Expiration Date:   12/29/2024    Reason for Exam (SYMPTOM  OR DIAGNOSIS REQUIRED):   right hip pain    Preferred imaging location?:   [facility]   Ambulatory referral to Physical Therapy    Referral Priority:   Routine    Referral Type:   Physical Medicine    Referral Reason:   Specialty Services Required    Requested Specialty:   Physical Therapy    Number of Visits Requested:   1   No orders of the defined types were placed in this encounter.    Discussed warning signs or symptoms. Please see discharge instructions. Patient expresses understanding.   The above documentation has been reviewed and is accurate and complete Artist Lloyd, M.D.

## 2024-12-11 ENCOUNTER — Encounter: Payer: Self-pay | Admitting: Family Medicine

## 2024-12-12 NOTE — Telephone Encounter (Signed)
 Forwarding to Dr. Denyse Amass to review and advise.

## 2024-12-13 ENCOUNTER — Ambulatory Visit: Payer: Self-pay | Admitting: Family Medicine

## 2024-12-13 NOTE — Progress Notes (Signed)
 Hip Xray shows mild arthritis both hips and irritation of the hip abductor tendons attaches to the pelvis gone.

## 2024-12-14 NOTE — Progress Notes (Signed)
 Lumbar spine x-ray shows arthritis at the base of the spine.  No broken bones are visible.

## 2024-12-18 ENCOUNTER — Ambulatory Visit: Admitting: Physical Therapy

## 2024-12-18 ENCOUNTER — Encounter: Payer: Self-pay | Admitting: Physical Therapy

## 2024-12-18 DIAGNOSIS — M5416 Radiculopathy, lumbar region: Secondary | ICD-10-CM | POA: Diagnosis not present

## 2024-12-18 DIAGNOSIS — M79604 Pain in right leg: Secondary | ICD-10-CM | POA: Diagnosis not present

## 2024-12-18 DIAGNOSIS — M5459 Other low back pain: Secondary | ICD-10-CM | POA: Diagnosis not present

## 2024-12-18 NOTE — Therapy (Signed)
 " OUTPATIENT PHYSICAL THERAPY THORACOLUMBAR EVALUATION   Patient Name: [name] MRN: [id number] DOB:[birth year], 79 y.o., male Today's Date: 12/18/2024  END OF SESSION:  PT End of Session - 12/18/24 1437     Visit Number 1    Number of Visits 12    Date for Recertification  02/12/25    Authorization Type HTA    PT Start Time 1433    PT Stop Time 1530    PT Time Calculation (min) 57 min    Activity Tolerance Patient tolerated treatment well    Behavior During Therapy [facility] for tasks assessed/performed          Past Medical History:  Diagnosis Date   Allergic rhinitis    Arthritis    CAD (coronary artery disease)    a. 11/11/15 - OOH VF arrest 2/2 ant STEMI >> CPR//AED shock x 2 >> ROSC >> LHC: pLAD 100 (Promus DES), mLCx 50, mRCA 30, EF 25% [managed with hypothermia protocol]   Fractured rib    GERD (gastroesophageal reflux disease)    History of Clostridium difficile colitis 10/2015   post MI >> required IV Flagyl  and IV Vanc >> DC on po Vanc   History of kidney stones    History of lithotripsy    HLD (hyperlipidemia)    Hypertension    Impaired glucose tolerance    Insomnia    Ischemic cardiomyopathy    a. EF at [facility] 11/11/15 25%;  b. Echo 11/14/15: EF 50-55%, dist ant and anterior septal HK   Nephrolithiasis 1968   Pelvic fracture ([name]) 09/2015   Pre-diabetes    Rheumatoid arthritis(714.0) 1998   ST elevation (STEMI) myocardial infarction involving left anterior descending coronary artery ([name]) 11/11/15   100% LAD, Cardiogenic Shock. -- EF ~20-25%   Past Surgical History:  Procedure Laterality Date   CARDIAC CATHETERIZATION N/A 11/11/2015   Procedure: Left Heart Cath and Cors/Grafts Angiography;  Surgeon: Ozell Fell, MD;  Location: [facility] INVASIVE CV LAB;  Service: Cardiovascular;  Laterality: N/A;   CARDIAC CATHETERIZATION N/A 11/11/2015   Procedure: Coronary Stent Intervention;  Surgeon: Ozell Fell, MD;  Location: [facility] INVASIVE CV LAB;  Service:  Cardiovascular;  Laterality: N/A;  prox lad 3.5x38 promus   CORONARY ANGIOPLASTY WITH STENT PLACEMENT  2016   EXTRACORPOREAL SHOCK WAVE LITHOTRIPSY Left 08/29/2017   Procedure: LEFT EXTRACORPOREAL SHOCK WAVE LITHOTRIPSY (ESWL);  Surgeon: Renda Glance, MD;  Location: [facility] ORS;  Service: Urology;  Laterality: Left;   status post multiple lithotripy and urological surgery for reccurent calcium  oxalate stones     Patient Active Problem List   Diagnosis Date Noted   Sciatica 08/13/2020   Fatigue 01/01/2019   Abdominal bloating 06/29/2018   Type 2 diabetes mellitus with hyperglycemia ([name]) 05/07/2016   Hyperlipidemia due to type 2 diabetes mellitus ([name]) 12/22/2015   CAD (coronary artery disease) 11/12/2015   Cardiomyopathy, ischemic - EF 20-25% by LV Gram 11/12/2015   Hypertension associated with diabetes ([name]) 03/24/2010   TOBACCO USE, QUIT 09/23/2009   Allergic rhinitis 12/18/2008   Rheumatoid arthritis ([name]) 12/18/2008   INSOMNIA 12/18/2008   Nephrolithiasis 12/18/2008    PCP: Kennyth Bart MD  REFERRING PROVIDER: Joane Birmingham MD   REFERRING DIAG:  M54.50,G89.29 (ICD-10-CM) - Chronic low back pain, unspecified back pain laterality, unspecified whether sciatica present  M25.551 (ICD-10-CM) - Right hip pain    Rationale for Evaluation and Treatment: Rehabilitation  THERAPY DIAG:  Other low back pain  Radiculopathy, lumbar region  Pain in right leg  ONSET DATE: Nov.   SUBJECTIVE:                                                                                                                                                                                           SUBJECTIVE STATEMENT: Patient reports the pain started back in November as he was blowing leaves . He noticed pain that worsened and was very severe from his back into his Rt hip and groin.  Swollen testicle.  He notes the pain is better but still is very cautious with his movements. He has limited his amount of  bending as a result The hardest thing now is at night, he does not sleep well.  The pain does travel down to his knee. At worst case, it travels to his arch of his foot. That is rare.  Inner thigh feels twisting pain to his knee.  Knee has gotten better. Felt good to have his knee lifted (flexed) when sleeping.  He denies numbness tingling, sensory changes or muscle spasms.  When the knee is hurting it feels a bit weaker but otherwise no difference in that.  PERTINENT HISTORY:  RA, MI , OA in hip, DDD in L spine   PAIN:  Are you having pain? Yes: NPRS scale: 6/10, moderate   Pain location: Rt back and hip  Pain description: tight, squeezing, twisting  Aggravating factors: bending, lifting  Relieving factors: positioning, walking not bad   PRECAUTIONS: None  RED FLAGS: None   WEIGHT BEARING RESTRICTIONS: No  FALLS:  Has patient fallen in last 6 months? 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   NEXT MD VISIT: as needed   OBJECTIVE:  Note: Objective measures were completed at Evaluation unless otherwise noted.  DIAGNOSTIC FINDINGS:  11/28/24:  LUMBAR SPINE: BONES: Subtle dextrocurvature of the lumbar spine. Vertebral body heights are maintained.   DISCS AND DEGENERATIVE CHANGES: Mild degenerative endplate osteophytes. Mild disc space narrowing at L2-L3 and L3-L4. Mild to moderate disc space narrowing at L4-L5. Moderate disc space narrowing at L5-S1. Facet arthrosis greatest at L4-L5 and L5-S1.   SOFT TISSUES: Vascular calcifications. 6 mm density overlying the left psoas muscle which likely corresponds to a  density within the posterior soft tissue seen on lateral images. No acute abnormality.  Hip XR : BONES AND JOINTS: No acute  fracture. No malalignment. Mild degenerative changes of bilateral hip Joints with mild joint space narrowing and osteophytosis of the superior acetabulum. There is enthesopathy of the bilateral iliac crests slightly greater on the right.   SOFT TISSUES: The soft tissues are unremarkable.   I MPRESSION: 1. Mild bilateral hip osteoarthritis. 2. Enthesopathy of the bilateral iliac crests, slightly greater on the right.    PATIENT SURVEYS:  Modified Oswestry:  Total 10/50    Minimally Clinically Important Difference (MCID) = 12.8%  COGNITION: Overall cognitive status: Within functional limits for tasks assessed     SENSATION: WFL  MUSCLE LENGTH: Hamstrings: tight bilateral  Thomas test: NT, tight quads bilateral   POSTURE: decreased lumbar lordosis  PALPATION: Palpable, painful band along Rt inner thigh, adductor and Pain into Rt post lateral hip. Knot lateral L4   LUMBAR ROM:   AROM eval  Flexion WFL painful, Poole just below knees   Extension WFL pain end range   Right lateral flexion Pain 25%   Left lateral flexion Pain 25%   Right rotation WFL   Left rotation WFL    (Blank rows = not tested)  LOWER EXTREMITY ROM:   WNL   Passive  Right eval Left eval  Hip flexion    Hip extension    Hip abduction    Hip adduction    Hip internal rotation    Hip external rotation    Knee flexion    Knee extension    Ankle dorsiflexion    Ankle plantarflexion    Ankle inversion    Ankle eversion     (Blank rows = not tested)  LOWER EXTREMITY MMT:  grossly WNL with mild weakness in glutes (hip ext, 4/5)   MMT Right eval Left eval  Hip flexion    Hip extension    Hip abduction    Hip adduction    Hip internal rotation    Hip external rotation    Knee flexion    Knee extension    Ankle dorsiflexion    Ankle plantarflexion    Ankle  inversion    Ankle eversion     (Blank rows = not tested)  LUMBAR SPECIAL TESTS:  Straight leg raise test: Positive L SLR   FUNCTIONAL TESTS:  5 times sit to stand: 10.7/ sec  30 seconds chair stand test NT   GAIT: Distance walked: 150+ mod I no deviations  Comments: stiffness with prolonged sitting to rise to stand   TREATMENT DATE:   12/19/23: PT evaluation complete for lower back pain extending into the right hip and thigh. Patient performed demo repetitions of his home exercise program and full Extensive history taken of his previous work duties and other injuries.                                                                                                                                  PATIENT EDUCATION:  Education details:  Differential diagnosis, plan of care hip mobility Person educated: Patient Education method: Explanation, Demonstration, and Handouts Education comprehension: verbalized understanding, returned demonstration, and needs further education  HOME EXERCISE PROGRAM: Access Code: [email or web address] URL: [email or web address] Date: 12/18/2024 Prepared by: Delon Norma  Exercises - Supine Hamstring Stretch with Strap  - 1 x daily - 7 x weekly - 1 sets - 3 reps - 30 hold - Supine ITB Stretch with Strap  - 1 x daily - 7 x weekly - 1 sets - 3 reps - 30 hold - Supine Lower Trunk Rotation  - 1 x daily - 7 x weekly - 1 sets - 10 reps - 10 hold - Supine Piriformis Stretch with Leg Straight  - 1 x daily - 7 x weekly - 1 sets - 3 reps - 30 hold - Supine Transversus Abdominis Bracing - Poole on Stomach  - 1 x daily - 7 x weekly - 2 sets - 10 reps - 5 hold - Side Lunge Adductor Stretch  - 1 x daily - 7 x weekly - 1 sets - 3 reps - 30 hold  ASSESSMENT:  CLINICAL IMPRESSION: Patient is a 79 y.o. male who was seen today for physical therapy evaluation and treatment for low back pain with rt hip, leg symptoms.  Signs and sx consistent with lumbar radiculopathy,  given pain with contralateral SLR and pain with sneezing, coughing.    OBJECTIVE IMPAIRMENTS: decreased coordination, decreased mobility, decreased ROM, decreased strength, increased fascial restrictions, impaired flexibility, improper body mechanics, postural dysfunction, and pain.   ACTIVITY LIMITATIONS: carrying, lifting, bending, squatting, and locomotion level  PARTICIPATION LIMITATIONS: community activity, yard work, and recreation  PERSONAL FACTORS: 1-2 comorbidities: Rheumatoid arthritis, osteoarthritis are also affecting patient's functional outcome.   REHAB POTENTIAL: Excellent  CLINICAL DECISION MAKING: Evolving/moderate complexity  EVALUATION COMPLEXITY: Moderate   GOALS: Goals reviewed with patient? Yes  SHORT TERM GOALS: Target date: 01/15/2025  Patient will be able to show independence for initial HEP to include posture, core and hip strength and stability.   Baseline: Goal status: INITIAL  2.  Patient will be able to demonstrate proper posture and lifting techniques related to spine health and reduction of symptoms.   Baseline:  Goal status: INITIAL  3.  Patient will report less pain in Rt leg with stretching and mobility exercise.  Baseline:  Goal status: INITIAL   LONG TERM GOALS: Target date: 02/12/2025    Patient will be independent with final HEP upon discharge from PT and report consistent benefit following exercise completion.    Baseline:  Goal status: INITIAL  2.  Patient will be able to return to yardwork with no more than min increase in back pain  Baseline:  Goal status: INITIAL  3.  Patient will be able to lift 25 lbs from the floor with good form and no increased pain  Baseline:  Goal status: INITIAL  4.  Patient will be able to retrieve light items from the floor without pain and improved confidence  Baseline:  Goal status: INITIAL  5.  Pt will be able to sit for 1 hour without increased back pain for meals  Baseline:  Goal status:  INITIAL   PLAN:  PT FREQUENCY: 1-2x/week  PT DURATION: 8 weeks  PLANNED INTERVENTIONS: 97164- PT Re-evaluation, 97750- Physical Performance Testing, 97110-Therapeutic exercises, 97530- Therapeutic activity, [id number]- Neuromuscular re-education, 97535- Self Care, [id number]- Manual therapy, 20560 (1-2 muscles), 20561 (3+ muscles)- Dry Needling, Patient/Family education, Joint mobilization, Spinal mobilization, Cryotherapy, and Moist heat.  PLAN  FOR NEXT SESSION: check HEP , begin core, posture and lifting /body mechanics    Deneisha Dade, PT 12/18/2024, 4:40 PM   Delon Norma, PT 12/18/2024 4:54 PM Phone: [phone] Fax: [phone]  "

## 2024-12-27 ENCOUNTER — Ambulatory Visit: Admitting: Physical Therapy

## 2024-12-27 ENCOUNTER — Encounter: Payer: Self-pay | Admitting: Physical Therapy

## 2024-12-27 DIAGNOSIS — M79604 Pain in right leg: Secondary | ICD-10-CM

## 2024-12-27 DIAGNOSIS — M5416 Radiculopathy, lumbar region: Secondary | ICD-10-CM | POA: Diagnosis not present

## 2024-12-27 DIAGNOSIS — M5459 Other low back pain: Secondary | ICD-10-CM

## 2024-12-27 NOTE — Therapy (Signed)
 " OUTPATIENT PHYSICAL THERAPY THORACOLUMBAR TREATMENT   Patient Name: [name] MRN: [id number] DOB:1946/01/20, 79 y.o., male Today's Date: 12/27/2024  END OF SESSION:  PT End of Session - 12/27/24 1015     Visit Number 2    Number of Visits 12    Date for Recertification  02/12/25    Authorization Type HTA    PT Start Time 1018    PT Stop Time 1103    PT Time Calculation (min) 45 min    Activity Tolerance Patient tolerated treatment well    Behavior During Therapy [facility] for tasks assessed/performed          Past Medical History:  Diagnosis Date   Allergic rhinitis    Arthritis    CAD (coronary artery disease)    a. 11/11/15 - OOH VF arrest 2/2 ant STEMI >> CPR//AED shock x 2 >> ROSC >> LHC: pLAD 100 (Promus DES), mLCx 50, mRCA 30, EF 25% [managed with hypothermia protocol]   Fractured rib    GERD (gastroesophageal reflux disease)    History of Clostridium difficile colitis 10/2015   post MI >> required IV Flagyl  and IV Vanc >> DC on po Vanc   History of kidney stones    History of lithotripsy    HLD (hyperlipidemia)    Hypertension    Impaired glucose tolerance    Insomnia    Ischemic cardiomyopathy    a. EF at [facility] 11/11/15 25%;  b. Echo 11/14/15: EF 50-55%, dist ant and anterior septal HK   Nephrolithiasis 1968   Pelvic fracture ([name]) 09/2015   Pre-diabetes    Rheumatoid arthritis(714.0) 1998   ST elevation (STEMI) myocardial infarction involving left anterior descending coronary artery ([name]) 11/11/15   100% LAD, Cardiogenic Shock. -- EF ~20-25%   Past Surgical History:  Procedure Laterality Date   CARDIAC CATHETERIZATION N/A 11/11/2015   Procedure: Left Heart Cath and Cors/Grafts Angiography;  Surgeon: Ozell Fell, MD;  Location: [facility] INVASIVE CV LAB;  Service: Cardiovascular;  Laterality: N/A;   CARDIAC CATHETERIZATION N/A 11/11/2015   Procedure: Coronary Stent Intervention;  Surgeon: Ozell Fell, MD;  Location: [facility] INVASIVE CV LAB;  Service:  Cardiovascular;  Laterality: N/A;  prox lad 3.5x38 promus   CORONARY ANGIOPLASTY WITH STENT PLACEMENT  2016   EXTRACORPOREAL SHOCK WAVE LITHOTRIPSY Left 08/29/2017   Procedure: LEFT EXTRACORPOREAL SHOCK WAVE LITHOTRIPSY (ESWL);  Surgeon: Renda Glance, MD;  Location: [facility] ORS;  Service: Urology;  Laterality: Left;   status post multiple lithotripy and urological surgery for reccurent calcium  oxalate stones     Patient Active Problem List   Diagnosis Date Noted   Sciatica 08/13/2020   Fatigue 01/01/2019   Abdominal bloating 06/29/2018   Type 2 diabetes mellitus with hyperglycemia ([name]) 05/07/2016   Hyperlipidemia due to type 2 diabetes mellitus ([name]) 12/22/2015   CAD (coronary artery disease) 11/12/2015   Cardiomyopathy, ischemic - EF 20-25% by LV Gram 11/12/2015   Hypertension associated with diabetes ([name]) 03/24/2010   TOBACCO USE, QUIT 09/23/2009   Allergic rhinitis 12/18/2008   Rheumatoid arthritis ([name]) 12/18/2008   INSOMNIA 12/18/2008   Nephrolithiasis 12/18/2008    PCP: Kennyth Bart MD  REFERRING PROVIDER: Joane Birmingham MD   REFERRING DIAG:  M54.50,G89.29 (ICD-10-CM) - Chronic low back pain, unspecified back pain laterality, unspecified whether sciatica present  M25.551 (ICD-10-CM) - Right hip pain    Rationale for Evaluation and Treatment: Rehabilitation  THERAPY DIAG:  Other low back pain  Radiculopathy, lumbar region  Pain in right leg  ONSET DATE: Nov.   SUBJECTIVE:                                                                                                                                                                                           SUBJECTIVE STATEMENT: States Pain in R side of back with coughing/sneezing. And into R anterior thigh.  Still having uncomfortable spasms in the night, at 3-5 am in thigh. Has been doing HEP, and feels that pain is a bit less.    Eval: Patient reports the pain started back in November as he was blowing leaves . He  noticed pain that worsened and was very severe from his back into his Rt hip and groin.  Swollen testicle.  He notes the pain is better but still is very cautious with his movements. He has limited his amount of bending as a result The hardest thing now is at night, he does not sleep well.  The pain does travel down to his knee. At worst case, it travels to his arch of his foot. That is rare.  Inner thigh feels twisting pain to his knee.  Knee has gotten better. Felt good to have his knee lifted (flexed) when sleeping.  He denies numbness tingling, sensory changes or muscle spasms.  When the knee is hurting it feels a bit weaker but otherwise no difference in that.  PERTINENT HISTORY:  RA, MI , OA in hip, DDD in L spine   PAIN:  Are you having pain? Yes: NPRS scale: 6/10, moderate   Pain location: Rt back and hip  Pain description: tight, squeezing, twisting  Aggravating factors: bending, lifting  Relieving factors: positioning, walking not bad   PRECAUTIONS: None  RED FLAGS: None   WEIGHT BEARING RESTRICTIONS: No  FALLS:  Has patient fallen in last 6 months? 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   NEXT MD VISIT: as needed   OBJECTIVE:  Note: Objective measures were completed at Evaluation unless otherwise noted.  DIAGNOSTIC FINDINGS:  11/28/24:  LUMBAR SPINE: BONES: Subtle dextrocurvature of the lumbar spine. Vertebral body heights are maintained.   DISCS AND DEGENERATIVE CHANGES: Mild degenerative endplate osteophytes. Mild disc space narrowing at L2-L3 and L3-L4. Mild to moderate disc space narrowing at  L4-L5. Moderate disc space narrowing at L5-S1. Facet arthrosis greatest at L4-L5  and L5-S1.   SOFT TISSUES: Vascular calcifications. 6 mm density overlying the left psoas muscle which likely corresponds to a density within the posterior soft tissue seen on lateral images. No acute abnormality.  Hip XR : BONES AND JOINTS: No acute fracture. No malalignment. Mild degenerative changes of bilateral hip Joints with mild joint space narrowing and osteophytosis of the superior acetabulum. There is enthesopathy of the bilateral iliac crests slightly greater on the right.   SOFT TISSUES: The soft tissues are unremarkable.   I MPRESSION: 1. Mild bilateral hip osteoarthritis. 2. Enthesopathy of the bilateral iliac crests, slightly greater on the right.    PATIENT SURVEYS:  Modified Oswestry:  Total 10/50    Minimally Clinically Important Difference (MCID) = 12.8%  COGNITION: Overall cognitive status: Within functional limits for tasks assessed     SENSATION: WFL  MUSCLE LENGTH: Hamstrings: tight bilateral  Thomas test: NT, tight quads bilateral   POSTURE: decreased lumbar lordosis  PALPATION: Palpable, painful band along Rt inner thigh, adductor and Pain into Rt post lateral hip. Knot lateral L4   LUMBAR ROM:   AROM eval  Flexion WFL painful, hands just below knees   Extension WFL pain end range   Right lateral flexion Pain 25%   Left lateral flexion Pain 25%   Right rotation WFL   Left rotation WFL    (Blank rows = not tested)  LOWER EXTREMITY ROM:   WNL   Passive  Right eval Left eval  Hip flexion    Hip extension    Hip abduction    Hip adduction    Hip internal rotation    Hip external rotation    Knee flexion    Knee extension    Ankle dorsiflexion    Ankle plantarflexion    Ankle inversion    Ankle eversion     (Blank rows = not tested)  LOWER EXTREMITY MMT:  grossly WNL with mild weakness in glutes (hip ext, 4/5)   MMT Right eval Left eval   Hip flexion    Hip extension    Hip abduction    Hip adduction    Hip internal rotation    Hip external rotation    Knee flexion    Knee extension    Ankle dorsiflexion    Ankle plantarflexion    Ankle inversion    Ankle eversion     (Blank rows = not tested)  LUMBAR SPECIAL TESTS:  Straight leg raise test: Positive L SLR   FUNCTIONAL TESTS:  5 times sit to stand: 10.7/ sec  30 seconds chair stand test NT   GAIT: Distance walked: 150+ mod I no deviations  Comments: stiffness with prolonged sitting to rise to stand   TREATMENT DATE:   12/27/24 Therapeutic Exercise: Aerobic: Supine: LTR x 15;  piriformis hip ER(mod fig 4 ) and hip IR  x 2 ea;   R hip flexor stretch- LE off side of table- x 3   TA contraction x 10, with breathing x 10;    SLR 2 x 5 bil with TA (very slight soreness in R side of back with R lift )  Prone:  knee flexion 2 x 10 (for quad stretch)  Seated: hamstring stretch x 3 bil;  Standing:  Stretches:  Neuromuscular Re-education: Manual Therapy:  LAD R LE for hip and lumbar , TPR to R L 4/5 paraspinals  Therapeutic Activity: Self Care:    12/19/23: PT evaluation complete for lower back pain extending into the  right hip and thigh. Patient performed demo repetitions of his home exercise program and full Extensive history taken of his previous work duties and other injuries.                                                                                                                                  PATIENT EDUCATION:  Education details: Differential diagnosis, plan of care hip mobility Person educated: Patient Education method: Explanation, Demonstration, and Handouts Education comprehension: verbalized understanding, returned demonstration, and needs further education  HOME EXERCISE PROGRAM: Access Code: [email or web address] URL: [email or web address] Date: 12/18/2024 Prepared by: Delon Norma  Exercises - Supine Hamstring Stretch with  Strap  - 1 x daily - 7 x weekly - 1 sets - 3 reps - 30 hold - Supine ITB Stretch with Strap  - 1 x daily - 7 x weekly - 1 sets - 3 reps - 30 hold - Supine Lower Trunk Rotation  - 1 x daily - 7 x weekly - 1 sets - 10 reps - 10 hold - Supine Piriformis Stretch with Leg Straight  - 1 x daily - 7 x weekly - 1 sets - 3 reps - 30 hold - Supine Transversus Abdominis Bracing - Hands on Stomach  - 1 x daily - 7 x weekly - 2 sets - 10 reps - 5 hold - Side Lunge Adductor Stretch  - 1 x daily - 7 x weekly - 1 sets - 3 reps - 30 hold  ASSESSMENT:  CLINICAL IMPRESSION: Pt with slight improvements in pain overall. Reviewed initial HEP for mechanics today. Pt cued for slowing speed. He has improved ability for TA contraction after education and practice with this today, but will benefit from continued work on this and progression of core stability. Added hip flexor stretches today for thigh pain, and added to HEP. Pt to benefit from education on bend, lift, squat motions in upcoming visits, for safety with outdoor work.   Eval: Patient is a 79 y.o. male who was seen today for physical therapy evaluation and treatment for low back pain with rt hip, leg symptoms.  Signs and sx consistent with lumbar radiculopathy, given pain with contralateral SLR and pain with sneezing, coughing.    OBJECTIVE IMPAIRMENTS: decreased coordination, decreased mobility, decreased ROM, decreased strength, increased fascial restrictions, impaired flexibility, improper body mechanics, postural dysfunction, and pain.   ACTIVITY LIMITATIONS: carrying, lifting, bending, squatting, and locomotion level  PARTICIPATION LIMITATIONS: community activity, yard work, and recreation  PERSONAL FACTORS: 1-2 comorbidities: Rheumatoid arthritis, osteoarthritis are also affecting patient's functional outcome.   REHAB POTENTIAL: Excellent  CLINICAL DECISION MAKING: Evolving/moderate complexity  EVALUATION COMPLEXITY: Moderate   GOALS: Goals  reviewed with patient? Yes  SHORT TERM GOALS: Target date: 01/15/2025  Patient will be able to show independence for initial HEP to include posture, core and hip strength and stability.   Baseline: Goal status: INITIAL  2.  Patient will be able to  demonstrate proper posture and lifting techniques related to spine health and reduction of symptoms.   Baseline:  Goal status: INITIAL  3.  Patient will report less pain in Rt leg with stretching and mobility exercise.  Baseline:  Goal status: INITIAL   LONG TERM GOALS: Target date: 02/12/2025    Patient will be independent with final HEP upon discharge from PT and report consistent benefit following exercise completion.    Baseline:  Goal status: INITIAL  2.  Patient will be able to return to yardwork with no more than min increase in back pain  Baseline:  Goal status: INITIAL  3.  Patient will be able to lift 25 lbs from the floor with good form and no increased pain  Baseline:  Goal status: INITIAL  4.  Patient will be able to retrieve light items from the floor without pain and improved confidence  Baseline:  Goal status: INITIAL  5.  Pt will be able to sit for 1 hour without increased back pain for meals  Baseline:  Goal status: INITIAL   PLAN:  PT FREQUENCY: 1-2x/week  PT DURATION: 8 weeks  PLANNED INTERVENTIONS: 97164- PT Re-evaluation, 97750- Physical Performance Testing, 97110-Therapeutic exercises, 97530- Therapeutic activity, [id number]- Neuromuscular re-education, 97535- Self Care, [id number]- Manual therapy, 20560 (1-2 muscles), 20561 (3+ muscles)- Dry Needling, Patient/Family education, Joint mobilization, Spinal mobilization, Cryotherapy, and Moist heat.  PLAN FOR NEXT SESSION: check HEP , begin core, posture and lifting /body mechanics    Tinnie Don, PT, DPT 11:17 AM  12/27/24     "

## 2024-12-28 ENCOUNTER — Encounter: Payer: Self-pay | Admitting: Physical Therapy

## 2024-12-31 ENCOUNTER — Encounter: Payer: Self-pay | Admitting: Physical Therapy

## 2024-12-31 ENCOUNTER — Ambulatory Visit: Admitting: Physical Therapy

## 2024-12-31 DIAGNOSIS — M5459 Other low back pain: Secondary | ICD-10-CM

## 2024-12-31 DIAGNOSIS — M79604 Pain in right leg: Secondary | ICD-10-CM

## 2024-12-31 DIAGNOSIS — M5416 Radiculopathy, lumbar region: Secondary | ICD-10-CM

## 2024-12-31 NOTE — Therapy (Signed)
 " OUTPATIENT PHYSICAL THERAPY THORACOLUMBAR TREATMENT   Patient Name: [name] MRN: [id number] DOB:1946/06/06, 79 y.o., male Today's Date: 12/31/2024  END OF SESSION:  PT End of Session - 12/31/24 0939     Visit Number 3    Number of Visits 12    Date for Recertification  02/12/25    Authorization Type HTA    PT Start Time [phone]    PT Stop Time 1018    PT Time Calculation (min) 42 min    Activity Tolerance Patient tolerated treatment well    Behavior During Therapy [facility] for tasks assessed/performed          Past Medical History:  Diagnosis Date   Allergic rhinitis    Arthritis    CAD (coronary artery disease)    a. 11/11/15 - OOH VF arrest 2/2 ant STEMI >> CPR//AED shock x 2 >> ROSC >> LHC: pLAD 100 (Promus DES), mLCx 50, mRCA 30, EF 25% [managed with hypothermia protocol]   Fractured rib    GERD (gastroesophageal reflux disease)    History of Clostridium difficile colitis 10/2015   post MI >> required IV Flagyl  and IV Vanc >> DC on po Vanc   History of kidney stones    History of lithotripsy    HLD (hyperlipidemia)    Hypertension    Impaired glucose tolerance    Insomnia    Ischemic cardiomyopathy    a. EF at [facility] 11/11/15 25%;  b. Echo 11/14/15: EF 50-55%, dist ant and anterior septal HK   Nephrolithiasis 1968   Pelvic fracture ([name]) 09/2015   Pre-diabetes    Rheumatoid arthritis(714.0) 1998   ST elevation (STEMI) myocardial infarction involving left anterior descending coronary artery ([name]) 11/11/15   100% LAD, Cardiogenic Shock. -- EF ~20-25%   Past Surgical History:  Procedure Laterality Date   CARDIAC CATHETERIZATION N/A 11/11/2015   Procedure: Left Heart Cath and Cors/Grafts Angiography;  Surgeon: Ozell Fell, MD;  Location: [facility] INVASIVE CV LAB;  Service: Cardiovascular;  Laterality: N/A;   CARDIAC CATHETERIZATION N/A 11/11/2015   Procedure: Coronary Stent Intervention;  Surgeon: Ozell Fell, MD;  Location: [facility] INVASIVE CV LAB;  Service:  Cardiovascular;  Laterality: N/A;  prox lad 3.5x38 promus   CORONARY ANGIOPLASTY WITH STENT PLACEMENT  2016   EXTRACORPOREAL SHOCK WAVE LITHOTRIPSY Left 08/29/2017   Procedure: LEFT EXTRACORPOREAL SHOCK WAVE LITHOTRIPSY (ESWL);  Surgeon: Renda Glance, MD;  Location: [facility] ORS;  Service: Urology;  Laterality: Left;   status post multiple lithotripy and urological surgery for reccurent calcium  oxalate stones     Patient Active Problem List   Diagnosis Date Noted   Sciatica 08/13/2020   Fatigue 01/01/2019   Abdominal bloating 06/29/2018   Type 2 diabetes mellitus with hyperglycemia ([name]) 05/07/2016   Hyperlipidemia due to type 2 diabetes mellitus ([name]) 12/22/2015   CAD (coronary artery disease) 11/12/2015   Cardiomyopathy, ischemic - EF 20-25% by LV Gram 11/12/2015   Hypertension associated with diabetes ([name]) 03/24/2010   TOBACCO USE, QUIT 09/23/2009   Allergic rhinitis 12/18/2008   Rheumatoid arthritis ([name]) 12/18/2008   INSOMNIA 12/18/2008   Nephrolithiasis 12/18/2008    PCP: Kennyth Bart MD  REFERRING PROVIDER: Joane Birmingham MD   REFERRING DIAG:  M54.50,G89.29 (ICD-10-CM) - Chronic low back pain, unspecified back pain laterality, unspecified whether sciatica present  M25.551 (ICD-10-CM) - Right hip pain    Rationale for Evaluation and Treatment: Rehabilitation  THERAPY DIAG:  Other low back pain  Radiculopathy, lumbar region  Pain in right leg  ONSET DATE: Nov.   SUBJECTIVE:                                                                                                                                                                                           SUBJECTIVE STATEMENT: Patient felt pain on the L side of his back when reaching into the car after the last visit.  I think doing the exercises on the L side helped.  And I walked .25 miles.  I am getting only 1-2 hours per night.    Eval: Patient reports the pain started back in November as he was blowing leaves .  He noticed pain that worsened and was very severe from his back into his Rt hip and groin.  Swollen testicle.  He notes the pain is better but still is very cautious with his movements. He has limited his amount of bending as a result The hardest thing now is at night, he does not sleep well.  The pain does travel down to his knee. At worst case, it travels to his arch of his foot. That is rare.  Inner thigh feels twisting pain to his knee.  Knee has gotten better. Felt good to have his knee lifted (flexed) when sleeping.  He denies numbness tingling, sensory changes or muscle spasms.  When the knee is hurting it feels a bit weaker but otherwise no difference in that.  PERTINENT HISTORY:  RA, MI , OA in hip, DDD in L spine   PAIN:  Are you having pain? Yes: NPRS scale: tender 3/10   Pain location: Rt back and hip  Pain description: tight, squeezing, twisting  Aggravating factors: bending, lifting  Relieving factors: positioning, walking not bad   PRECAUTIONS: None  RED FLAGS: None   WEIGHT BEARING RESTRICTIONS: No  FALLS:  Has patient fallen in last 6 months? 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   NEXT MD VISIT: as needed   OBJECTIVE:  Note: Objective measures were completed at Evaluation unless otherwise noted.  DIAGNOSTIC FINDINGS:  11/28/24:  LUMBAR SPINE: BONES: Subtle dextrocurvature of the lumbar spine. Vertebral body heights are maintained.   DISCS AND DEGENERATIVE CHANGES: Mild degenerative endplate osteophytes. Mild disc space narrowing at L2-L3 and L3-L4. Mild to moderate disc space narrowing at  L4-L5. Moderate disc space narrowing at  L5-S1. Facet arthrosis greatest at L4-L5 and L5-S1.   SOFT TISSUES: Vascular calcifications. 6 mm density overlying the left psoas muscle which likely corresponds to a density within the posterior soft tissue seen on lateral images. No acute abnormality.  Hip XR : BONES AND JOINTS: No acute fracture. No malalignment. Mild degenerative changes of bilateral hip Joints with mild joint space narrowing and osteophytosis of the superior acetabulum. There is enthesopathy of the bilateral iliac crests slightly greater on the right.   SOFT TISSUES: The soft tissues are unremarkable.   I MPRESSION: 1. Mild bilateral hip osteoarthritis. 2. Enthesopathy of the bilateral iliac crests, slightly greater on the right.    PATIENT SURVEYS:  Modified Oswestry:  Total 10/50    Minimally Clinically Important Difference (MCID) = 12.8%  COGNITION: Overall cognitive status: Within functional limits for tasks assessed     SENSATION: WFL  MUSCLE LENGTH: Hamstrings: tight bilateral  Thomas test: NT, tight quads bilateral   POSTURE: decreased lumbar lordosis  PALPATION: Palpable, painful band along Rt inner thigh, adductor and Pain into Rt post lateral hip. Knot lateral L4   LUMBAR ROM:   AROM eval  Flexion WFL painful, hands just below knees   Extension WFL pain end range   Right lateral flexion Pain 25%   Left lateral flexion Pain 25%   Right rotation WFL   Left rotation WFL    (Blank rows = not tested)  LOWER EXTREMITY ROM:   WNL   Passive  Right eval Left eval  Hip flexion    Hip extension    Hip abduction    Hip adduction    Hip internal rotation    Hip external rotation    Knee flexion    Knee extension    Ankle dorsiflexion    Ankle plantarflexion    Ankle inversion    Ankle eversion     (Blank rows = not tested)  LOWER EXTREMITY MMT:  grossly WNL with mild weakness in glutes (hip ext, 4/5)   MMT Right eval Left eval   Hip flexion    Hip extension    Hip abduction    Hip adduction    Hip internal rotation    Hip external rotation    Knee flexion    Knee extension    Ankle dorsiflexion    Ankle plantarflexion    Ankle inversion    Ankle eversion     (Blank rows = not tested)  LUMBAR SPECIAL TESTS:  Straight leg raise test: Positive L SLR   FUNCTIONAL TESTS:  5 times sit to stand: 10.7/ sec  30 seconds chair stand test NT   GAIT: Distance walked: 150+ mod I no deviations  Comments: stiffness with prolonged sitting to rise to stand   TREATMENT DATE:     [facility] Adult PT Treatment:                                                DATE: 12/31/24 Manual Therapy: LAD R LE for hip and lumbar , TPR to R L 4/5 paraspinals  TrPR to Rt glute med, piriformis, STW to Lumbar paraspinals  Therapeutic Activity: Prone positioning for manual to reduce sx  Prone press up on forearms  Hip extension  Supine 90/90 for rest  Self Care: Body mechanics and lifting, position of rest  12/27/24 Therapeutic Exercise: Aerobic: Supine: LTR x 15;  piriformis hip ER(mod fig 4 ) and hip IR  x 2 ea;   R hip flexor stretch- LE off side of table- x 3   TA contraction x 10, with breathing x 10;    SLR 2 x 5 bil with TA (very slight soreness in R side of back with R lift )  Prone:  knee flexion 2 x 10 (for quad stretch)  Seated: hamstring stretch x 3 bil;  Standing:  Stretches:  Neuromuscular Re-education: Manual Therapy:  LAD R LE for hip and lumbar , TPR to R L 4/5 paraspinals  Therapeutic Activity: Self Care:    12/19/23: PT evaluation complete for lower back pain extending into the right hip and thigh. Patient performed demo repetitions of his home exercise program and full Extensive history taken of his previous work duties and other injuries.                                                                                                                                  PATIENT EDUCATION:   Education details: Differential diagnosis, plan of care hip mobility Person educated: Patient Education method: Explanation, Demonstration, and Handouts Education comprehension: verbalized understanding, returned demonstration, and needs further education  HOME EXERCISE PROGRAM: Access Code: [email or web address] URL: [email or web address] Date: 12/18/2024 Prepared by: Delon Norma   ASSESSMENT:  CLINICAL IMPRESSION: Pt with a slight set back as he reached into the car the other day.  He shows a clear bias to extension with his back and LE pain.  Added some prone press ups to his HEP to see if that can give him some relief. He is continuing to do his stretches and modify as needed. Pt to benefit from education on bend, lift, squat motions in upcoming visits, for safety with outdoor work.   Eval: Patient is a 79 y.o. male who was seen today for physical therapy evaluation and treatment for low back pain with rt hip, leg symptoms.  Signs and sx consistent with lumbar radiculopathy, given pain with contralateral SLR and pain with sneezing, coughing.    OBJECTIVE IMPAIRMENTS: decreased coordination, decreased mobility, decreased ROM, decreased strength, increased fascial restrictions, impaired flexibility, improper body mechanics, postural dysfunction, and pain.   ACTIVITY LIMITATIONS: carrying, lifting, bending, squatting, and locomotion level  PARTICIPATION LIMITATIONS: community activity, yard work, and recreation  PERSONAL FACTORS: 1-2 comorbidities: Rheumatoid arthritis, osteoarthritis are also affecting patient's functional outcome.   REHAB POTENTIAL: Excellent  CLINICAL DECISION MAKING: Evolving/moderate complexity  EVALUATION COMPLEXITY: Moderate   GOALS: Goals reviewed with patient? Yes  SHORT TERM GOALS: Target date: 01/15/2025  Patient will be able to show independence for initial HEP to include posture, core and hip strength and stability.   Baseline: Goal status:  ongoing   2.  Patient will be able to demonstrate proper posture and lifting techniques related to spine health and reduction of symptoms.   Baseline:  Goal status: INITIAL  3.  Patient will report less pain in Rt leg with stretching and mobility exercise.  Baseline:  Goal status: ongoing    LONG TERM GOALS: Target date: 02/12/2025    Patient will be independent with final HEP upon discharge from PT and report consistent benefit following exercise completion.    Baseline:  Goal status: INITIAL  2.  Patient will be able to return to yardwork with no more than min increase in back pain  Baseline:  Goal status: INITIAL  3.  Patient will be able to lift 25 lbs from the floor with good form and no increased pain  Baseline:  Goal status: INITIAL  4.  Patient will be able to retrieve light items from the floor without pain and improved confidence  Baseline:  Goal status: INITIAL  5.  Pt will be able to sit for 1 hour without increased back pain for meals  Baseline:  Goal status: INITIAL   PLAN:  PT FREQUENCY: 1-2x/week  PT DURATION: 8 weeks  PLANNED INTERVENTIONS: 97164- PT Re-evaluation, 97750- Physical Performance Testing, 97110-Therapeutic exercises, 97530- Therapeutic activity, [id number]- Neuromuscular re-education, 97535- Self Care, 02859- Manual therapy, 20560 (1-2 muscles), 20561 (3+ muscles)- Dry Needling, Patient/Family education, Joint mobilization, Spinal mobilization, Cryotherapy, and Moist heat.  PLAN FOR NEXT SESSION: check HEP extension, lifting, hinge. Did prone help?    Delon Norma, PT 12/31/24 10:15 AM Phone: [phone] Fax: [phone]    "

## 2025-01-09 ENCOUNTER — Encounter: Admitting: Physical Therapy

## 2025-01-09 ENCOUNTER — Encounter: Payer: Self-pay | Admitting: Family Medicine

## 2025-01-09 NOTE — Telephone Encounter (Signed)
 Please advise.

## 2025-01-10 ENCOUNTER — Other Ambulatory Visit: Payer: Self-pay | Admitting: *Deleted

## 2025-01-10 DIAGNOSIS — E1165 Type 2 diabetes mellitus with hyperglycemia: Secondary | ICD-10-CM

## 2025-01-10 NOTE — Telephone Encounter (Signed)
 Patient notified of Dr. Carroll response via MyChart.

## 2025-01-10 NOTE — Telephone Encounter (Signed)
 Please advise.

## 2025-01-10 NOTE — Telephone Encounter (Signed)
 Ok to refer to endocrinology though I would like for him to check in with us  in a few weeks to let us  know how he is doing.

## 2025-01-10 NOTE — Telephone Encounter (Signed)
 I am sorry to hear he is having so many issues with the Mounjaro . If he wishes to stop completely that is fine or we can back down to the lowest dose at 2.5 mg weekly and see how he does with this for a few weeks.

## 2025-01-11 NOTE — Telephone Encounter (Signed)
 It would be fine for him to restart metformin  at 500 mg daily though I would recommend he wait until his side effects from Mounjaro  resolve before restarting this.

## 2025-01-14 ENCOUNTER — Encounter: Admitting: Physical Therapy

## 2025-01-15 ENCOUNTER — Encounter: Admitting: Physical Therapy

## 2025-01-17 ENCOUNTER — Encounter: Admitting: Physical Therapy

## 2025-01-18 ENCOUNTER — Ambulatory Visit: Payer: Self-pay | Admitting: *Deleted

## 2025-01-18 NOTE — Telephone Encounter (Signed)
 FYI Only or Action Required?: Action required by provider: update on patient condition and stopped taking mounjaro  1 week ago due to side effects of constipation.  Patient was last seen in primary care on 11/26/2024 by Kennyth Worth HERO, MD.  Called Nurse Triage reporting Pain.  Symptoms began several weeks ago.  Interventions attempted: Rest, hydration, or home remedies and Other: therapy exercises.  Symptoms are: gradually worsening.  Triage Disposition: See HCP Within 4 Hours (Or PCP Triage)  Patient/caregiver understands and will follow disposition?: No, wishes to speak with PCP  Offered appt today and patient wanted appt with Dr. Joane at [facility]. Appt scheduled by their office. Please advise regarding patient stopping mounjaro  medication.                  Reason for Disposition  [1] SEVERE back pain (e.g., excruciating, unable to do any normal activities) AND [2] not improved 2 hours after pain medicine  Answer Assessment - Initial Assessment Questions Offered appt today with PCP to review sx of low back pain , left hip pain and on going constipation. Patient has stopped mounjaro  1 week ago due to worsening constipation and unsure if constipation could have caused back pain to return or worsen. Patient declined appt today and wants appt with Dr. Joane at [facility]. Transferred patient to Dr. Virgilio office and patient scheduled appt. Recommended if sx worsen go to UC or ED if needed.        1. ONSET: When did the pain begin? (e.g., minutes, hours, days)     3 weeks ago 2. LOCATION: Where does it hurt? (upper, mid or lower back)     Low back left hip area to outside left leg 3. SEVERITY: How bad is the pain?  (e.g., Scale 1-10; mild, moderate, or severe)     9/10  4. PATTERN: Is the pain constant? (e.g., yes, no; constant, intermittent)      Intermittent  5. RADIATION: Does the pain shoot into your legs or somewhere else?      Shoots down left leg 6. CAUSE:  What do you think is causing the back pain?      Not sure possible sciatica 7. BACK OVERUSE:  Any recent lifting of heavy objects, strenuous work or exercise?     Physical therapy difficult to do  8. MEDICINES: What have you taken so far for the pain? (e.g., nothing, acetaminophen , NSAIDS)     na 9. NEUROLOGIC SYMPTOMS: Do you have any weakness, numbness, or problems with bowel/bladder control?     Pain down left leg 10. OTHER SYMPTOMS: Do you have any other symptoms? (e.g., fever, abdomen pain, burning with urination, blood in urine)       Low back pain, left hip, stabbing pain. Pain shoots down left outter leg. Not able to complete therapy exercises. Reports worsening constipation. Stopped mounjaro  1 week ago and still having constipation and concerned that may have caused pain in back and hip.  Protocols used: Back Pain-A-AH

## 2025-01-18 NOTE — Telephone Encounter (Signed)
 See note

## 2025-01-21 ENCOUNTER — Encounter: Admitting: Physical Therapy

## 2025-01-23 ENCOUNTER — Ambulatory Visit: Admitting: Family Medicine

## 2025-01-28 ENCOUNTER — Encounter: Admitting: Physical Therapy

## 2025-02-07 ENCOUNTER — Encounter: Admitting: Physical Therapy

## 2025-02-12 ENCOUNTER — Encounter: Admitting: Physical Therapy

## 2025-02-26 ENCOUNTER — Ambulatory Visit: Admitting: Family Medicine

## 2025-04-02 ENCOUNTER — Ambulatory Visit: Admitting: Cardiovascular Disease

## 2025-05-22 ENCOUNTER — Ambulatory Visit: Admitting: Endocrinology

## 2025-08-29 ENCOUNTER — Encounter: Admitting: Family Medicine

## 2025-09-02 ENCOUNTER — Ambulatory Visit
